# Patient Record
Sex: Female | Born: 1935
Health system: Southern US, Community
[De-identification: ages and names within clinical notes are randomized; demographics above are authoritative.]

## PROBLEM LIST (undated history)

## (undated) DIAGNOSIS — R7303 Prediabetes: Secondary | ICD-10-CM

## (undated) DIAGNOSIS — I10 Essential (primary) hypertension: Secondary | ICD-10-CM

## (undated) DIAGNOSIS — E785 Hyperlipidemia, unspecified: Secondary | ICD-10-CM

## (undated) DIAGNOSIS — K219 Gastro-esophageal reflux disease without esophagitis: Secondary | ICD-10-CM

## (undated) DIAGNOSIS — M810 Age-related osteoporosis without current pathological fracture: Secondary | ICD-10-CM

## (undated) DIAGNOSIS — J449 Chronic obstructive pulmonary disease, unspecified: Secondary | ICD-10-CM

## (undated) DIAGNOSIS — E559 Vitamin D deficiency, unspecified: Secondary | ICD-10-CM

## (undated) HISTORY — PX: TONSILLECTOMY: SUR1361

## (undated) HISTORY — DX: Gastro-esophageal reflux disease without esophagitis: K21.9

## (undated) HISTORY — DX: Vitamin D deficiency, unspecified: E55.9

## (undated) HISTORY — DX: Chronic obstructive pulmonary disease, unspecified: J44.9

## (undated) HISTORY — DX: Prediabetes: R73.03

## (undated) HISTORY — PX: VESICOVAGINAL FISTULA CLOSURE W/ TAH: SUR271

## (undated) HISTORY — DX: Hyperlipidemia, unspecified: E78.5

## (undated) HISTORY — DX: Age-related osteoporosis without current pathological fracture: M81.0

## (undated) HISTORY — DX: Essential (primary) hypertension: I10

---
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
## 1998-03-27 ENCOUNTER — Ambulatory Visit (HOSPITAL_COMMUNITY): Admission: RE | Admit: 1998-03-27 | Discharge: 1998-03-27 | Payer: Self-pay | Admitting: *Deleted

## 1998-03-27 ENCOUNTER — Other Ambulatory Visit: Admission: RE | Admit: 1998-03-27 | Discharge: 1998-03-27 | Payer: Self-pay | Admitting: *Deleted

## 1998-05-07 ENCOUNTER — Other Ambulatory Visit: Admission: RE | Admit: 1998-05-07 | Discharge: 1998-05-07 | Payer: Self-pay | Admitting: Obstetrics and Gynecology

## 1999-06-17 ENCOUNTER — Other Ambulatory Visit: Admission: RE | Admit: 1999-06-17 | Discharge: 1999-06-17 | Payer: Self-pay | Admitting: Obstetrics and Gynecology

## 2000-06-22 ENCOUNTER — Other Ambulatory Visit: Admission: RE | Admit: 2000-06-22 | Discharge: 2000-06-22 | Payer: Self-pay | Admitting: Obstetrics and Gynecology

## 2000-09-30 ENCOUNTER — Encounter (INDEPENDENT_AMBULATORY_CARE_PROVIDER_SITE_OTHER): Payer: Self-pay | Admitting: *Deleted

## 2000-09-30 ENCOUNTER — Ambulatory Visit (HOSPITAL_COMMUNITY): Admission: RE | Admit: 2000-09-30 | Discharge: 2000-09-30 | Payer: Self-pay | Admitting: Gastroenterology

## 2000-10-17 LAB — HM COLONOSCOPY

## 2001-06-24 ENCOUNTER — Other Ambulatory Visit: Admission: RE | Admit: 2001-06-24 | Discharge: 2001-06-24 | Payer: Self-pay | Admitting: Obstetrics and Gynecology

## 2006-06-02 ENCOUNTER — Encounter: Admission: RE | Admit: 2006-06-02 | Discharge: 2006-06-02 | Payer: Self-pay | Admitting: Orthopaedic Surgery

## 2007-04-11 ENCOUNTER — Encounter: Admission: RE | Admit: 2007-04-11 | Discharge: 2007-04-11 | Payer: Self-pay | Admitting: Orthopaedic Surgery

## 2007-10-22 ENCOUNTER — Inpatient Hospital Stay (HOSPITAL_COMMUNITY): Admission: AD | Admit: 2007-10-22 | Discharge: 2007-10-22 | Payer: Self-pay | Admitting: Obstetrics and Gynecology

## 2008-07-03 ENCOUNTER — Ambulatory Visit: Payer: Self-pay | Admitting: *Deleted

## 2008-07-05 ENCOUNTER — Ambulatory Visit: Payer: Self-pay | Admitting: *Deleted

## 2008-08-03 ENCOUNTER — Encounter: Admission: RE | Admit: 2008-08-03 | Discharge: 2008-08-03 | Payer: Self-pay | Admitting: Orthopaedic Surgery

## 2009-08-02 ENCOUNTER — Ambulatory Visit: Payer: Self-pay | Admitting: Vascular Surgery

## 2010-10-27 ENCOUNTER — Encounter (INDEPENDENT_AMBULATORY_CARE_PROVIDER_SITE_OTHER): Payer: Self-pay | Admitting: Internal Medicine

## 2010-10-27 ENCOUNTER — Ambulatory Visit (HOSPITAL_COMMUNITY): Admission: RE | Admit: 2010-10-27 | Discharge: 2010-10-27 | Payer: Self-pay | Admitting: Internal Medicine

## 2010-10-27 ENCOUNTER — Ambulatory Visit: Payer: Self-pay

## 2010-10-27 ENCOUNTER — Ambulatory Visit: Payer: Self-pay | Admitting: Cardiovascular Disease

## 2011-04-21 NOTE — Assessment & Plan Note (Signed)
OFFICE VISIT   Alyssa Schmitt, Alyssa Schmitt  DOB:  [birth year]                                       08/02/2009  EAVWU#:[id number]   The patient presents today for evaluation of her lower extremity  discomfort.  She has a complex past history.  She is a 77 year old white  female with sciatic pain, also pain from plantar fasciitis.  She had  been seen in our office 3 years ago for evaluation and was found to have  normal arterial flow at that time.  She was concerned regarding  continued pain and was seen again for evaluation of this.   Her past history is significant for hypertension.  She also has chronic  venous hypertension changes.   She is married.  She is retired.   REVIEW OF SYSTEMS:  Positive for reflux and extensive arthritis.   PHYSICAL EXAMINATION:  A well-developed, well-nourished white female  appearing stated age of 89.  Blood pressure is 150/79, pulse 78,  respirations 18.  Her radial and dorsalis pedis pulses are 2+  bilaterally.  She has changes of hemosiderin deposit and marked venous  hypertension from her knees distally down onto her ankles.   She underwent noninvasive vascular laboratory studies in our office.  This reveals a normal ankle-arm index and waveforms bilaterally.   I discussed this with the patient and her family who were present.  I do  not see any evidence of arterial insufficiency to explain any of her  discomfort.  She was reassured with this and will see [place] again on an as-  needed basis.   Larina Earthly, M.D.  Electronically Signed   TFE/MEDQ  D:  08/02/2009  T:  08/05/2009  Job:  [id number]   cc:   Vivia Birmingham, MD

## 2011-04-21 NOTE — Assessment & Plan Note (Signed)
OFFICE VISIT   JAQUELINNE, GLENDENING  DOB:  [birth year]                                       07/05/2008  [id number]#:[id number]   The patient is seen today for a slowly healing superficial abrasion on  the dorsum of her right foot.  This occurred 2-3 weeks ago.  She has  noted this to be slow to heal and moderately painful.   Known history of chronic venous insufficiency and varicose veins.  This  has been treated for years with compression hose.   Lower extremity arterial Doppler carried out 07/03/2008 reveals ABIs 1.0  bilaterally with triphasic dorsalis pedis waveforms consistent with a  normal arterial study.   PHYSICAL EXAMINATION:  Evaluation reveals a 77 year old female alert and  oriented.  No acute distress.  BP 139/75, pulse 81 per minute.  Lower  extremity evaluation reveals chronic venous changes bilaterally.  Noted  are pigmentation superficial varicosities.  No palpable pedal pulses.  The feet however are well-perfused.  One to two plus edema.  Superficial  abrasion noted over the dorsum of the right foot 1.5 cm in diameter.  No  surrounding erythema.   The patient and her daughter were reassured that there is no significant  arterial insufficiency.  I recommended local wound care with dry  dressing and avoid tape to the skin.  This should heal well with this  regimen, continue compression therapy for treatment of chronic venous  insufficiency.   Balinda Quails, M.D.  Electronically Signed   PGH/MEDQ  D:  07/05/2008  T:  07/06/2008  Job:  [id number]   cc:   Lovenia Kim, D.O.

## 2011-12-04 ENCOUNTER — Other Ambulatory Visit: Payer: Self-pay | Admitting: Orthopaedic Surgery

## 2011-12-04 ENCOUNTER — Encounter: Payer: Self-pay | Admitting: Cardiovascular Disease

## 2011-12-04 ENCOUNTER — Ambulatory Visit
Admission: RE | Admit: 2011-12-04 | Discharge: 2011-12-04 | Disposition: A | Discharge status: Home / Self Care | Payer: Medicare Other | Source: Ambulatory Visit | Attending: Orthopaedic Surgery | Admitting: Orthopaedic Surgery

## 2011-12-04 DIAGNOSIS — M79669 Pain in unspecified lower leg: Secondary | ICD-10-CM

## 2011-12-07 ENCOUNTER — Other Ambulatory Visit: Payer: Self-pay

## 2012-01-08 ENCOUNTER — Encounter: Payer: Self-pay | Admitting: Cardiovascular Disease

## 2012-02-22 ENCOUNTER — Encounter: Payer: Self-pay | Admitting: Cardiovascular Disease

## 2012-03-01 ENCOUNTER — Other Ambulatory Visit: Payer: Self-pay

## 2012-03-01 DIAGNOSIS — I83893 Varicose veins of bilateral lower extremities with other complications: Secondary | ICD-10-CM

## 2012-03-25 ENCOUNTER — Ambulatory Visit (INDEPENDENT_AMBULATORY_CARE_PROVIDER_SITE_OTHER): Payer: Medicare Other | Admitting: Cardiovascular Disease

## 2012-03-25 ENCOUNTER — Encounter: Payer: Self-pay | Admitting: Cardiovascular Disease

## 2012-03-25 VITALS — BP 136/82 | HR 89 | Resp 18 | Ht 63.0 in | Wt 186.8 lb

## 2012-03-25 DIAGNOSIS — R011 Cardiac murmur, unspecified: Secondary | ICD-10-CM

## 2012-03-25 DIAGNOSIS — R600 Localized edema: Secondary | ICD-10-CM

## 2012-03-25 DIAGNOSIS — R609 Edema, unspecified: Secondary | ICD-10-CM

## 2012-03-25 MED ORDER — POTASSIUM CHLORIDE CRYS ER 20 MEQ PO TBCR: 20.0000 meq | EXTENDED_RELEASE_TABLET | Freq: Every day | ORAL | Status: DC

## 2012-03-25 MED ORDER — FUROSEMIDE 20 MG PO TABS: 20.0000 mg | ORAL_TABLET | Freq: Every day | ORAL | Status: DC

## 2012-03-25 NOTE — Progress Notes (Signed)
    Evonnie Pat Date of Birth  [birth year] [facility]     [place] [facility]  [address]    [address]   [address] [place], [place]  [address]    [place], [place]  [address] [phone]  Fax  [phone]  [phone]  Fax [phone]   History of Present Illness:  Mrs. Perkinson is an 78 year old female with a history of chronic leg edema. She has worsening leg edema on the right compared to the left.   She's had an echocardiogram in November, 2011 which revealed normal left ventricular systolic function. She has only minor valvular problems.  She does not have episodes of chest pain or shortness of breath.   She walks with the assistance of a cane.  She's been taking Lasix for leg. She's noted that it really has not helped much. She's had a venous duplex scan which did not reveal any DVT in her lower extremities.  Current Outpatient Prescriptions on File Prior to Visit  Medication Sig Dispense Refill  . calcium-vitamin D (OSCAL WITH D) 250-125 MG-UNIT per tablet Take 1 tablet by mouth daily.      . fexofenadine (ALLEGRA) 180 MG tablet Take 180 mg by mouth daily.      . furosemide (LASIX) 20 MG tablet Take 20 mg by mouth 2 (two) times daily.      [name] Kitchen levothyroxine (SYNTHROID, LEVOTHROID) 50 MCG tablet Take 50 mcg by mouth daily.      [name] Kitchen omeprazole (PRILOSEC) 20 MG capsule Take 20 mg by mouth daily.      . potassium chloride SA (K-DUR,KLOR-CON) 20 MEQ tablet Take 20 mEq by mouth 2 (two) times daily.      [name] Kitchen TERAZOSIN HCL PO Take by mouth.        Allergies  Allergen Reactions  . Aspirin     Past Medical History  Diagnosis Date  . Abnormal EKG   . H/O echocardiogram     No past surgical history on file.  History  Smoking status  . Never Smoker   Smokeless tobacco  . Not on file    History  Alcohol Use: Not on file    No family history on file.  Reviw of Systems:  Reviewed in the HPI.  All other systems are negative.  Physical Exam: Blood  pressure 136/82, pulse 89, resp. rate 18, height 5\' 3"  (1.6 m), weight 186 lb 12.8 oz (84.732 kg). General: Well developed, well nourished, in no acute distress.  Head: Normocephalic, atraumatic, sclera non-icteric, mucus membranes are moist,   Neck: Supple. Carotids are 2 + without bruits. No JVD  Lungs: Clear bilaterally to auscultation.  Heart: regular rate.  normal  S1 S2. No murmurs, gallops or rubs.  Abdomen: Soft, non-tender, non-distended with normal bowel sounds. No hepatomegaly. No rebound/guarding. No masses.  Msk:  Strength and tone are normal  Extremities: No clubbing or cyanosis. No edema.  Distal pedal pulses are 2+ and equal bilaterally.  Neuro: Alert and oriented X 3. Moves all extremities spontaneously.  Psych:  Responds to questions appropriately with a normal affect.  ECG: 03/25/2012-normal sinus rhythm. Possible left atrial enlargement. There is left axis deviation. There is an intraventricular conduction delay/incomplete right bundle branch block.  Assessment / Plan:

## 2012-03-25 NOTE — Assessment & Plan Note (Signed)
Class presents with chronic leg edema. She's had this leg edema for many years. Her echocardiogram shows that she has normal left ventricular systolic function and has minimal valvular abnormalities.  I don't think that she has any specific cardiac etiology to explain her leg edema.  She has an appointment to see Dr. Edilia Bo next month for evaluation of her veins. I've given her a brochure for the" [facility]" progress which I think will help.  I do not think that the double dose Lasix is helping her any at all. In fact she feels fairly poorly on Lasix. I've recommended that she decrease her Lasix and her potassium to once a day. Given her normal left ventricular systolic function and normal renal function she should not need heart doses of Lasix for leg edema.  In addition, I do not think that increasing her Lasix will ever completely control her leg edema since it is due to venous insufficiency.  I'll see her back on an as-needed basis. She'll followup with with her general medical doctor and with Dr. Edilia Bo.

## 2012-03-25 NOTE — Patient Instructions (Addendum)
Your physician recommends that you schedule a follow-up appointment in: as needed basis  Your physician has requested that you have an echocardiogram. Echocardiography is a painless test that uses sound waves to create images of your heart. It provides your doctor with information about the size and shape of your heart and how well your heart's chambers and valves are working. This procedure takes approximately one hour. There are no restrictions for this procedure.  Your physician has recommended you make the following change in your medication:   DECREASE LASIX TO 20 MG DAILY DECREASE POTASSIUM TO 20 MEQ DAILY

## 2012-04-04 ENCOUNTER — Other Ambulatory Visit: Payer: Self-pay

## 2012-04-04 ENCOUNTER — Ambulatory Visit (HOSPITAL_COMMUNITY): Payer: Medicare Other | Attending: Cardiology

## 2012-04-04 DIAGNOSIS — I517 Cardiomegaly: Secondary | ICD-10-CM | POA: Insufficient documentation

## 2012-04-04 DIAGNOSIS — R609 Edema, unspecified: Secondary | ICD-10-CM | POA: Insufficient documentation

## 2012-04-04 DIAGNOSIS — R011 Cardiac murmur, unspecified: Secondary | ICD-10-CM

## 2012-04-04 DIAGNOSIS — I519 Heart disease, unspecified: Secondary | ICD-10-CM | POA: Insufficient documentation

## 2012-04-04 DIAGNOSIS — Z8249 Family history of ischemic heart disease and other diseases of the circulatory system: Secondary | ICD-10-CM | POA: Insufficient documentation

## 2012-04-04 DIAGNOSIS — I059 Rheumatic mitral valve disease, unspecified: Secondary | ICD-10-CM

## 2012-04-04 DIAGNOSIS — I359 Nonrheumatic aortic valve disorder, unspecified: Secondary | ICD-10-CM | POA: Insufficient documentation

## 2012-04-12 ENCOUNTER — Encounter: Payer: Self-pay | Admitting: Vascular Surgery

## 2012-04-13 ENCOUNTER — Ambulatory Visit (INDEPENDENT_AMBULATORY_CARE_PROVIDER_SITE_OTHER): Payer: Medicare Other | Admitting: Vascular Surgery

## 2012-04-13 ENCOUNTER — Encounter: Payer: Self-pay | Admitting: Vascular Surgery

## 2012-04-13 ENCOUNTER — Encounter (INDEPENDENT_AMBULATORY_CARE_PROVIDER_SITE_OTHER): Payer: Medicare Other | Admitting: *Deleted

## 2012-04-13 VITALS — BP 116/71 | HR 81 | Resp 18 | Ht 64.0 in | Wt 174.6 lb

## 2012-04-13 DIAGNOSIS — I83009 Varicose veins of unspecified lower extremity with ulcer of unspecified site: Secondary | ICD-10-CM

## 2012-04-13 DIAGNOSIS — M7989 Other specified soft tissue disorders: Secondary | ICD-10-CM

## 2012-04-13 DIAGNOSIS — L97909 Non-pressure chronic ulcer of unspecified part of unspecified lower leg with unspecified severity: Secondary | ICD-10-CM

## 2012-04-13 DIAGNOSIS — I83893 Varicose veins of bilateral lower extremities with other complications: Secondary | ICD-10-CM

## 2012-04-13 NOTE — Progress Notes (Signed)
Removed bilateral Unna Boots and cleansed bilateral calves and feet with Carraklenz.  Reapplied bilateral [employer]. Pt. tolerated procedure well.  Alyssa Schmitt, Neena Rhymes

## 2012-04-13 NOTE — Progress Notes (Signed)
[facility]   Patient name: Alyssa Schmitt MRN: [id number] DOB: 11-02-1928 Sex: female  REASON FOR CONSULT: Severe chronic venous insufficiency. Referred by Dr. Oneta Rack   HPI: Alyssa Schmitt is a 78 y.o. female with a long history of chronic venous insufficiency. Approximately a month ago she developed some superficial ulcerations on her legs and has been in boots for the last 2 weeks. These are changed twice a week. These ulcers have improved significantly since she's been undergoing this treatment. She's had discoloration in both lower ferries for as long as she can remember. She is unaware of any previous history of DVT or phlebitis. Does experience swelling in both lower extremities which is exacerbated by standing and sitting. Her swelling is relieved somewhat with elevation. She has not been wearing medical grade compression stockings.  She denies fever or chills. She has been evaluated by Dr. Delane Ginger and was not felt to have congestive heart failure.  Past Medical History  Diagnosis Date  . Abnormal EKG   . H/O echocardiogram   . Ulcer   . Allergy   . Arthritis   . Hypertension   . Thyroid disease     History reviewed. No pertinent family history.  SOCIAL HISTORY: History  Substance Use Topics  . Smoking status: Never Smoker   . Smokeless tobacco: Not on file  . Alcohol Use: No    Allergies  Allergen Reactions  . Aspirin   . Penicillins   . Prednisone   . Tetanus Toxoids   . Tetracyclines & Related     Current Outpatient Prescriptions  Medication Sig Dispense Refill  . BusPIRone HCl (BUSPAR PO) Take 10 mg by mouth 3 (three) times daily. Take 1/2- 1 tablet 3 times daily.      . calcium-vitamin D (OSCAL WITH D) 250-125 MG-UNIT per tablet Take 1 tablet by mouth daily.      . Cholecalciferol (VITAMIN D3) 2000 UNITS TABS Take 1 tablet by mouth 2 (two) times daily.       . citalopram (CELEXA) 20 MG tablet Take 20 mg by mouth daily.       . fexofenadine (ALLEGRA) 180 MG tablet Take 180 mg by mouth daily.      . furosemide (LASIX) 20 MG tablet Take 1 tablet (20 mg total) by mouth daily.  30 tablet  1  . levofloxacin (LEVAQUIN) 500 MG tablet Take 500 mg by mouth daily.      [name] Kitchen levothyroxine (SYNTHROID, LEVOTHROID) 50 MCG tablet Take 50 mcg by mouth daily.      . Magnesium 250 MG TABS Take by mouth.      . Multiple Vitamin (MULITIVITAMIN WITH MINERALS) TABS Take 1 tablet by mouth daily.      [name] Kitchen omeprazole (PRILOSEC) 20 MG capsule Take 40 mg by mouth daily.       . potassium chloride SA (K-DUR,KLOR-CON) 20 MEQ tablet Take 1 tablet (20 mEq total) by mouth daily.      . Probiotic Product (PROBIOTIC FORMULA PO) Take by mouth.      . TERAZOSIN HCL PO Take 10 mg by mouth at bedtime.       . traMADol (ULTRAM) 50 MG tablet Take 50 mg by mouth every 6 (six) hours as needed.      . pyridOXINE (VITAMIN B-6) 100 MG tablet Take 100 mg by mouth daily.        REVIEW OF SYSTEMS: Arly.Keller ] denotes positive finding; [  ] denotes negative finding  CARDIOVASCULAR:  [ ]  chest pain   [ ]  chest pressure   [ ]  palpitations   [ ]  orthopnea   [ ]  dyspnea on exertion   [ ]  claudication   [ ]  rest pain   [ ]  DVT   [ ]  phlebitis PULMONARY:   [ ]  productive cough   [ ]  asthma   [ ]  wheezing NEUROLOGIC:   [ ]  weakness  [ ]  paresthesias  [ ]  aphasia  [ ]  amaurosis  [ ]  dizziness HEMATOLOGIC:   [ ]  bleeding problems   [ ]  clotting disorders MUSCULOSKELETAL:  Arly.Keller ] joint pain   [ ]  joint swelling Arly.Keller ] leg swelling GASTROINTESTINAL: [ ]   blood in stool  [ ]   hematemesis GENITOURINARY:  [ ]   dysuria  [ ]   hematuria PSYCHIATRIC:  [ ]  history of major depression INTEGUMENTARY:  [ ]  rashes  Arly.Keller ] ulcers CONSTITUTIONAL:  [ ]  fever   [ ]  chills  PHYSICAL EXAM: Filed Vitals:   04/13/12 1254  BP: 116/71  Pulse: 81  Resp: 18  Height: 5\' 4"  (1.626 m)  Weight: 174 lb 9.6 oz (79.198 kg)   Body mass index is 29.97 kg/(m^2). GENERAL: The patient is a well-nourished  female, in no acute distress. The vital signs are documented above. CARDIOVASCULAR: There is a regular rate and rhythm without significant murmur appreciated. I do not detect carotid bruits. She has palpable femoral pulses. As a palpable left dorsalis pedis pulse. I cannot palpate posterior tibial pulses because of her swelling. She has moderate bilateral lower extremity swelling. PULMONARY: There is good air exchange bilaterally without wheezing or rales. ABDOMEN: Soft and non-tender with normal pitched bowel sounds.  MUSCULOSKELETAL: There are no major deformities or cyanosis. NEUROLOGIC: No focal weakness or paresthesias are detected. SKIN: she has hyperpigmentation bilaterally consistent with chronic venous insufficiency. She had previous ulcer also in the left leg. PSYCHIATRIC: The patient has a normal affect.  DATA:  She did have a venous duplex scan in [facility] imaging on 12/04/2011 which showed no evidence of DVT in the right lower extremity. I have independently interpreted her venous duplex scan in our office today which shows reflux in both greater saphenous veins. The left greater saphenous vein is duplicate in the thigh. He also has a reflux in the right lesser saphenous vein. There was no evidence of DVT. He was also have reflux in the deep system bilaterally.  Have also reviewed her records that were sent with her. She has been on antibiotics because of cellulitis in both lower extremities. She been taking Levaquin daily.  MEDICAL ISSUES: This patient has evidence of significant bilateral chronic venous insufficiency. We've discussed the importance of continued leg elevation. I would recommend continuing with her Unna boot dressing changes for 2-4 more weeks. Once his wounds have completely healed she should keep the skin well-lubricated and wear medical grade compression stockings. I have written her a prescription for a thigh high compression stockings with a 20-30 mm of mercury  pressure gradient. She does have significant reflux in both greater saphenous veins and all make an appointment for her to see Dr. Hart Rochester in 3 months to be considered for possible laser ablation of her saphenous veins. She knows to call sooner she has problems.   Danitza Schoenfeldt S [facility] [place] [facility]: [phone]

## 2012-04-20 NOTE — Procedures (Unsigned)
LOWER EXTREMITY VENOUS REFLUX EXAM  INDICATION:  Bilateral lower extremity edema with ulceration.  EXAM:  Using color-flow imaging and pulse Doppler spectral analysis, the bilateral common femoral, femoral, popliteal, posterior tibial, great and small saphenous veins were evaluated.  There is evidence suggesting deep venous insufficiency in the bilateral lower extremities.  The bilateral saphenofemoral junction is not competent with Reflux of >576milliseconds. The bilateral great saphenous vein is not competent with Reflux of >516milliseconds with the caliber as described below.   The right proximal small saphenous vein demonstrates incompetency.  GSV Diameter (used if found to be incompetent only)                                                          Right Left Proximal Greater Saphenous Vein                          0.76 cm 0.76 cm Proximal-to-mid-thigh                                    0.73 cm     0.9 cm Mid thigh                                                0.79 cm 0.58/0.66 cm Mid-distal thigh                                         cm          cm Distal thigh                                             0.90 cm 0.64/0.56 cm Knee                                                     0.79 cm 0.56/0.42 cm  IMPRESSION: 1. The bilateral great saphenous vein is not competent with Reflux of     >568milliseconds.  The left great saphenous vein is duplicated in     the thigh. 2. The deep venous system is not competent with Reflux of     >542milliseconds. 3. The right small saphenous vein is not competent; however,     measurements could not be taken due to wound dressings.  ___________________________________________ Di Kindle. Edilia Bo, M.D.  LT/MEDQ  D:  04/13/2012  T:  04/13/2012  Job:  [id number]

## 2012-07-18 ENCOUNTER — Encounter: Payer: Self-pay | Admitting: Vascular Surgery

## 2012-07-19 ENCOUNTER — Ambulatory Visit (INDEPENDENT_AMBULATORY_CARE_PROVIDER_SITE_OTHER): Payer: Medicare Other | Admitting: Vascular Surgery

## 2012-07-19 ENCOUNTER — Encounter: Payer: Self-pay | Admitting: Vascular Surgery

## 2012-07-19 VITALS — BP 160/75 | HR 87 | Resp 18 | Ht 62.0 in | Wt 175.0 lb

## 2012-07-19 DIAGNOSIS — I83893 Varicose veins of bilateral lower extremities with other complications: Secondary | ICD-10-CM

## 2012-07-19 NOTE — Progress Notes (Signed)
Subjective:     Patient ID: Alyssa Schmitt, female   DOB: 05/31/1928, 78 y.o.   MRN: [id number]  HPI this 78 year old female returns today to discuss further treatment regarding her severe venous insufficiency of both lower extremities. She has had multiple stasis ulcers in both legs most recent of which were treated with Unna boots successfully. She was found to have gross reflux in both great saphenous systems at her last visit when seen by Dr. Durwin Nora. She has been wearing long leg elastic compression stockings and trying elevation and ibuprofen. Currently the ulcers are healed. She also has aching throbbing and burning discomfort throughout the day.  Past Medical History  Diagnosis Date  . Abnormal EKG   . H/O echocardiogram   . Ulcer   . Allergy   . Arthritis   . Hypertension   . Thyroid disease     History  Substance Use Topics  . Smoking status: Never Smoker   . Smokeless tobacco: Never Used  . Alcohol Use: No    Family History  Problem Relation Age of Onset  . Heart disease Mother   . Other Mother     VARICOSE VEINS  . Diabetes Father   . Other Brother     VARICOSE VEINS    Allergies  Allergen Reactions  . Aspirin   . Celebrex (Celecoxib)   . Penicillins   . Prednisone   . Tetanus Toxoids   . Tetracyclines & Related     Current outpatient prescriptions:BusPIRone HCl (BUSPAR PO), Take 10 mg by mouth 3 (three) times daily. Take 1/2- 1 tablet 3 times daily., Disp: , Rfl: ;  calcium-vitamin D (OSCAL WITH D) 250-125 MG-UNIT per tablet, Take 1 tablet by mouth daily., Disp: , Rfl: ;  Cholecalciferol (VITAMIN D3) 2000 UNITS TABS, Take 1 tablet by mouth 2 (two) times daily. , Disp: , Rfl: ;  citalopram (CELEXA) 20 MG tablet, Take 20 mg by mouth daily., Disp: , Rfl:  fexofenadine (ALLEGRA) 180 MG tablet, Take 180 mg by mouth daily., Disp: , Rfl: ;  levofloxacin (LEVAQUIN) 500 MG tablet, Take 500 mg by mouth daily., Disp: , Rfl: ;  levothyroxine (SYNTHROID, LEVOTHROID) 50 MCG  tablet, Take 50 mcg by mouth daily., Disp: , Rfl: ;  Magnesium 250 MG TABS, Take by mouth., Disp: , Rfl: ;  Multiple Vitamin (MULITIVITAMIN WITH MINERALS) TABS, Take 1 tablet by mouth daily., Disp: , Rfl:  omeprazole (PRILOSEC) 20 MG capsule, Take 40 mg by mouth daily. , Disp: , Rfl: ;  Probiotic Product (PROBIOTIC FORMULA PO), Take by mouth., Disp: , Rfl: ;  pyridOXINE (VITAMIN B-6) 100 MG tablet, Take 100 mg by mouth daily., Disp: , Rfl: ;  TERAZOSIN HCL PO, Take 10 mg by mouth at bedtime. , Disp: , Rfl: ;  traMADol (ULTRAM) 50 MG tablet, Take 50 mg by mouth every 6 (six) hours as needed., Disp: , Rfl:  furosemide (LASIX) 20 MG tablet, Take 1 tablet (20 mg total) by mouth daily., Disp: 30 tablet, Rfl: 1;  potassium chloride SA (K-DUR,KLOR-CON) 20 MEQ tablet, Take 1 tablet (20 mEq total) by mouth daily., Disp: , Rfl:   BP 160/75  Pulse 87  Resp 18  Ht 5\' 2"  (1.575 m)  Wt 175 lb (79.379 kg)  BMI 32.01 kg/m2  Body mass index is 32.01 kg/(m^2).          Review of Systems denies chest pain, dyspnea on exertion, PND, orthopnea, hemoptysis.     Objective:   Physical  Exam blood pressure 160/75 heart rate 87 respirations 18 Elderly female in no apparent distress alert and oriented x3 Lungs no rhonchi or wheezing Bilateral lower extremities with chronic 1-2+ edema. She has severe hyperpigmentation and thickening of the skin lower half of both legs. 2+ dorsalis pedis pulses palpable.  I have reviewed her venous duplex exam which reveals severe reflux in both great saphenous systems. The right side has a single system the left side has a double system. All of these have reflux. His    Assessment:     History of severe bilateral venous insufficiency with multiple stasis ulcers currently healed 2due to reflux great saphenous systems bilaterally    Plan:       patient needs #1 laser ablation right great saphenous vein #2 laser ablation left great saphenous vein and anterior accessory branch  left great saphenous vein as a single procedure  Will proceed with precertification to perform this in the near future to

## 2012-08-01 ENCOUNTER — Other Ambulatory Visit: Payer: Self-pay | Admitting: *Deleted

## 2012-08-01 DIAGNOSIS — I83893 Varicose veins of bilateral lower extremities with other complications: Secondary | ICD-10-CM

## 2012-08-16 ENCOUNTER — Emergency Department (HOSPITAL_COMMUNITY)
Admission: EM | Admit: 2012-08-16 | Discharge: 2012-08-16 | Disposition: A | Discharge status: Home / Self Care | Payer: Medicare Other | Attending: Emergency Medicine | Admitting: Emergency Medicine

## 2012-08-16 ENCOUNTER — Encounter (HOSPITAL_COMMUNITY): Payer: Self-pay | Admitting: *Deleted

## 2012-08-16 ENCOUNTER — Emergency Department (HOSPITAL_COMMUNITY): Payer: Medicare Other

## 2012-08-16 DIAGNOSIS — I10 Essential (primary) hypertension: Secondary | ICD-10-CM | POA: Insufficient documentation

## 2012-08-16 DIAGNOSIS — Z8249 Family history of ischemic heart disease and other diseases of the circulatory system: Secondary | ICD-10-CM | POA: Insufficient documentation

## 2012-08-16 DIAGNOSIS — Z880 Allergy status to penicillin: Secondary | ICD-10-CM | POA: Insufficient documentation

## 2012-08-16 DIAGNOSIS — S0100XA Unspecified open wound of scalp, initial encounter: Secondary | ICD-10-CM | POA: Insufficient documentation

## 2012-08-16 DIAGNOSIS — Z833 Family history of diabetes mellitus: Secondary | ICD-10-CM | POA: Insufficient documentation

## 2012-08-16 DIAGNOSIS — Z881 Allergy status to other antibiotic agents status: Secondary | ICD-10-CM | POA: Insufficient documentation

## 2012-08-16 DIAGNOSIS — W19XXXA Unspecified fall, initial encounter: Secondary | ICD-10-CM | POA: Insufficient documentation

## 2012-08-16 DIAGNOSIS — Z888 Allergy status to other drugs, medicaments and biological substances status: Secondary | ICD-10-CM | POA: Insufficient documentation

## 2012-08-16 DIAGNOSIS — S2239XA Fracture of one rib, unspecified side, initial encounter for closed fracture: Secondary | ICD-10-CM | POA: Insufficient documentation

## 2012-08-16 DIAGNOSIS — S0101XA Laceration without foreign body of scalp, initial encounter: Secondary | ICD-10-CM

## 2012-08-16 DIAGNOSIS — Z8489 Family history of other specified conditions: Secondary | ICD-10-CM | POA: Insufficient documentation

## 2012-08-16 MED ORDER — HYDROCODONE-ACETAMINOPHEN 5-500 MG PO TABS: 1.0000 | ORAL_TABLET | Freq: Four times a day (QID) | ORAL | Status: AC | PRN

## 2012-08-16 NOTE — ED Notes (Signed)
Pt states she got up this AM to go to the bathroom and got her feet tangled and fell-struck the top of her head-noted head lac with bleeding controlled at present time-also states her tailbone hurts and she has an abrasion to her left mid back

## 2012-08-16 NOTE — ED Notes (Signed)
Pt is alert and oriented-no LOC-in no distress-wound care with 1/2 strength peroxide to lac top of her head-warm blankets applied for comfort

## 2012-08-16 NOTE — ED Provider Notes (Signed)
History     CSN: [id number]  Arrival date & time 08/16/12  4098   First MD Initiated Contact with Patient 08/16/12 [phone]      Chief Complaint  Patient presents with  . Fall     Patient is a 78 y.o. female presenting with fall. The history is provided by the patient.  Fall The fall occurred while walking. She landed on a hard floor. The volume of blood lost was moderate. The point of impact was the head. Pertinent negatives include no numbness and no nausea.   Pt states she got up this AM to go to the bathroom and got her feet tangled and fell-struck the top of her head-noted head lac with bleeding controlled at present time-also states her tailbone hurts and she has an abrasion to her left mid back  Past Medical History  Diagnosis Date  . Abnormal EKG   . H/O echocardiogram   . Ulcer   . Allergy   . Arthritis   . Hypertension   . Thyroid disease     Past Surgical History  Procedure Date  . Tonsillectomy     childhood  . Vesicovaginal fistula closure w/ tah     Family History  Problem Relation Age of Onset  . Heart disease Mother   . Other Mother     VARICOSE VEINS  . Diabetes Father   . Other Brother     VARICOSE VEINS    History  Substance Use Topics  . Smoking status: Never Smoker   . Smokeless tobacco: Never Used  . Alcohol Use: No    OB History    Grav Para Term Preterm Abortions TAB SAB Ect Mult Living                  Review of Systems  Gastrointestinal: Negative for nausea.  Neurological: Negative for numbness.  All other systems reviewed and are negative.    Allergies  Aspirin; Celebrex; Penicillins; Prednisone; Tetanus toxoids; and Tetracyclines & related  Home Medications   Current Outpatient Rx  Name Route Sig Dispense Refill  . BUSPAR PO Oral Take 10 mg by mouth 3 (three) times daily. Take 1/2- 1 tablet 3 times daily.    [name] Kitchen CALCIUM CARBONATE-VITAMIN D 250-125 MG-UNIT PO TABS Oral Take 1 tablet by mouth daily.    [name] Kitchen VITAMIN D3 2000  UNITS PO TABS Oral Take 1 tablet by mouth 2 (two) times daily.     [name] Kitchen CITALOPRAM HYDROBROMIDE 20 MG PO TABS Oral Take 20 mg by mouth daily.    [name] Kitchen FEXOFENADINE HCL 180 MG PO TABS Oral Take 180 mg by mouth daily.    . FUROSEMIDE 20 MG PO TABS Oral Take 1 tablet (20 mg total) by mouth daily. 30 tablet 1  . HYDROCODONE-ACETAMINOPHEN 5-500 MG PO TABS Oral Take 1-2 tablets by mouth every 6 (six) hours as needed for pain. 15 tablet 0  . LEVOFLOXACIN 500 MG PO TABS Oral Take 500 mg by mouth daily.    [name] Kitchen LEVOTHYROXINE SODIUM 50 MCG PO TABS Oral Take 50 mcg by mouth daily.    [name] Kitchen MAGNESIUM 250 MG PO TABS Oral Take by mouth.    . ADULT MULTIVITAMIN W/MINERALS CH Oral Take 1 tablet by mouth daily.    [name] Kitchen OMEPRAZOLE 20 MG PO CPDR Oral Take 40 mg by mouth daily.     [name] Kitchen POTASSIUM CHLORIDE CRYS ER 20 MEQ PO TBCR Oral Take 1 tablet (20 mEq total) by mouth daily.    [name] Kitchen  PROBIOTIC FORMULA PO Oral Take by mouth.    [name] Kitchen VITAMIN B-6 100 MG PO TABS Oral Take 100 mg by mouth daily.    [name] Kitchen TERAZOSIN HCL PO Oral Take 10 mg by mouth at bedtime.     . TRAMADOL HCL 50 MG PO TABS Oral Take 50 mg by mouth every 6 (six) hours as needed.      BP 150/55  Pulse 73  Temp 97.5 F (36.4 C) (Oral)  Resp 20  Ht 5\' 1"  (1.549 m)  Wt 170 lb (77.111 kg)  BMI 32.12 kg/m2  SpO2 99%  Physical Exam  HENT:  Head: Head is with laceration.    Pulmonary/Chest:      ED Course  LACERATION REPAIR Date/Time: 08/16/2012 5:07 AM Performed by: Nelia Shi Authorized by: Nelia Shi Consent: Verbal consent obtained. Risks and benefits: risks, benefits and alternatives were discussed Consent given by: patient Patient understanding: patient states understanding of the procedure being performed Patient identity confirmed: verbally with patient Time out: Immediately prior to procedure a "time out" was called to verify the correct patient, procedure, equipment, support staff and site/side marked as required. Body area:  head/neck Location details: scalp Laceration length: 6 cm Foreign bodies: no foreign bodies Vascular damage: no Anesthesia: local infiltration Local anesthetic: lidocaine 2% with epinephrine Anesthetic total: 5 ml Patient sedated: no Preparation: Patient was prepped and draped in the usual sterile fashion. Irrigation solution: tap water Amount of cleaning: standard Debridement: none Degree of undermining: none Skin closure: staples Number of sutures: 7 Technique: simple Approximation: close Dressing: antibiotic ointment Patient tolerance: Patient tolerated the procedure well with no immediate complications.   (including critical care time)  Labs Reviewed - No data to display Dg Ribs Unilateral W/chest Left  08/16/2012  *RADIOLOGY REPORT*  Clinical Data: Left lower rib pain after fall.  LEFT RIBS AND CHEST - 3+ VIEW  Comparison: None.  Findings: Cardiac enlargement with mild pulmonary vascular congestion.  No significant edema.  Slight fibrosis in the lung bases.  No focal airspace consolidation in the lungs.  No blunting of costophrenic angles.  No pneumothorax.  Large esophageal hiatal hernia behind the heart.  Calcification of the aorta.  Minimal focal irregularities in the left posterolateral 7th and 10th ribs may represent nondisplaced fractures.  Left ribs appear otherwise intact.  No focal bone lesions or expansile change.  IMPRESSION: No evidence of active pulmonary disease.  Suggestion of nondisplaced left seventh and tenth rib fractures.   Original Report Authenticated By: Marlon Pel, M.D.      1. Scalp laceration   2. Rib fracture       MDM          Nelia Shi, MD 08/16/12 [phone]

## 2012-08-22 ENCOUNTER — Other Ambulatory Visit: Payer: Medicare Other | Admitting: Vascular Surgery

## 2012-08-29 ENCOUNTER — Encounter: Payer: Self-pay | Admitting: Vascular Surgery

## 2012-08-30 ENCOUNTER — Ambulatory Visit: Payer: Medicare Other | Admitting: Vascular Surgery

## 2013-04-19 ENCOUNTER — Other Ambulatory Visit (HOSPITAL_COMMUNITY): Payer: Self-pay | Admitting: Internal Medicine

## 2013-04-19 DIAGNOSIS — M810 Age-related osteoporosis without current pathological fracture: Secondary | ICD-10-CM

## 2013-04-20 ENCOUNTER — Ambulatory Visit (HOSPITAL_COMMUNITY)
Admission: RE | Admit: 2013-04-20 | Discharge: 2013-04-20 | Disposition: A | Discharge status: Home / Self Care | Payer: Medicare Other | Source: Ambulatory Visit | Attending: Internal Medicine | Admitting: Internal Medicine

## 2013-04-20 DIAGNOSIS — Z13820 Encounter for screening for osteoporosis: Secondary | ICD-10-CM | POA: Insufficient documentation

## 2013-04-20 DIAGNOSIS — M810 Age-related osteoporosis without current pathological fracture: Secondary | ICD-10-CM

## 2013-04-20 DIAGNOSIS — Z780 Asymptomatic menopausal state: Secondary | ICD-10-CM | POA: Insufficient documentation

## 2013-06-16 LAB — HM MAMMOGRAPHY

## 2013-10-17 ENCOUNTER — Encounter: Payer: Self-pay | Admitting: Internal Medicine

## 2013-10-17 DIAGNOSIS — R7309 Other abnormal glucose: Secondary | ICD-10-CM | POA: Insufficient documentation

## 2013-10-17 DIAGNOSIS — E559 Vitamin D deficiency, unspecified: Secondary | ICD-10-CM

## 2013-10-17 DIAGNOSIS — I10 Essential (primary) hypertension: Secondary | ICD-10-CM | POA: Insufficient documentation

## 2013-10-17 DIAGNOSIS — E785 Hyperlipidemia, unspecified: Secondary | ICD-10-CM

## 2013-10-17 DIAGNOSIS — R7303 Prediabetes: Secondary | ICD-10-CM

## 2013-10-17 DIAGNOSIS — E782 Mixed hyperlipidemia: Secondary | ICD-10-CM | POA: Insufficient documentation

## 2013-10-18 ENCOUNTER — Encounter: Payer: Self-pay | Admitting: Physician Assistant

## 2013-10-18 ENCOUNTER — Ambulatory Visit: Payer: Medicare Other | Admitting: Physician Assistant

## 2013-10-18 VITALS — BP 130/78 | HR 84 | Temp 97.9°F | Resp 16 | Wt 177.0 lb

## 2013-10-18 DIAGNOSIS — E785 Hyperlipidemia, unspecified: Secondary | ICD-10-CM

## 2013-10-18 DIAGNOSIS — E559 Vitamin D deficiency, unspecified: Secondary | ICD-10-CM

## 2013-10-18 DIAGNOSIS — R7303 Prediabetes: Secondary | ICD-10-CM

## 2013-10-18 DIAGNOSIS — I10 Essential (primary) hypertension: Secondary | ICD-10-CM

## 2013-10-18 LAB — BASIC METABOLIC PANEL WITH GFR
BUN: 20 mg/dL (ref 6–23)
CO2: 28 mEq/L (ref 19–32)
Calcium: 9.5 mg/dL (ref 8.4–10.5)
Chloride: 101 mEq/L (ref 96–112)
Creat: 0.92 mg/dL (ref 0.50–1.10)
GFR, Est African American: 67 mL/min
GFR, Est Non African American: 58 mL/min — ABNORMAL LOW
Glucose, Bld: 84 mg/dL (ref 70–99)
Potassium: 4.3 mEq/L (ref 3.5–5.3)
Sodium: 138 mEq/L (ref 135–145)

## 2013-10-18 LAB — CBC WITH DIFFERENTIAL/PLATELET
Basophils Absolute: 0 10*3/uL (ref 0.0–0.1)
Basophils Relative: 0 % (ref 0–1)
Eosinophils Absolute: 0 10*3/uL (ref 0.0–0.7)
Eosinophils Relative: 1 % (ref 0–5)
HCT: 38.6 % (ref 36.0–46.0)
Hemoglobin: 13.2 g/dL (ref 12.0–15.0)
Lymphocytes Relative: 43 % (ref 12–46)
Lymphs Abs: 1.9 10*3/uL (ref 0.7–4.0)
MCH: 30.3 pg (ref 26.0–34.0)
MCHC: 34.2 g/dL (ref 30.0–36.0)
MCV: 88.5 fL (ref 78.0–100.0)
Monocytes Absolute: 0.5 10*3/uL (ref 0.1–1.0)
Monocytes Relative: 11 % (ref 3–12)
Neutro Abs: 2 10*3/uL (ref 1.7–7.7)
Neutrophils Relative %: 45 % (ref 43–77)
Platelets: 177 10*3/uL (ref 150–400)
RBC: 4.36 MIL/uL (ref 3.87–5.11)
RDW: 13.5 % (ref 11.5–15.5)
WBC: 4.6 10*3/uL (ref 4.0–10.5)

## 2013-10-18 LAB — HEPATIC FUNCTION PANEL
ALT: 20 U/L (ref 0–35)
AST: 32 U/L (ref 0–37)
Albumin: 4.2 g/dL (ref 3.5–5.2)
Alkaline Phosphatase: 67 U/L (ref 39–117)
Bilirubin, Direct: 0.2 mg/dL (ref 0.0–0.3)
Indirect Bilirubin: 0.5 mg/dL (ref 0.0–0.9)
Total Bilirubin: 0.7 mg/dL (ref 0.3–1.2)
Total Protein: 7.5 g/dL (ref 6.0–8.3)

## 2013-10-18 LAB — LIPID PANEL
Cholesterol: 152 mg/dL (ref 0–200)
HDL: 52 mg/dL (ref >39)
LDL Cholesterol: 81 mg/dL (ref 0–99)
Total CHOL/HDL Ratio: 2.9 Ratio
Triglycerides: 96 mg/dL (ref <150)
VLDL: 19 mg/dL (ref 0–40)

## 2013-10-18 LAB — TSH: TSH: 1.759 u[IU]/mL (ref 0.350–4.500)

## 2013-10-18 LAB — HEMOGLOBIN A1C
Hgb A1c MFr Bld: 5.3 % (ref <5.7)
Mean Plasma Glucose: 105 mg/dL (ref <117)

## 2013-10-18 NOTE — Patient Instructions (Signed)

## 2013-10-18 NOTE — Progress Notes (Signed)
HPI Patient presents for 3 month follow up with hypertension, hyperlipidemia, prediabetes and vitamin D.  Patient's blood pressure has been controlled at home. Patient denies chest pain, shortness of breath, dizziness.  Patient's cholesterol is diet controlled. The cholesterol last visit was 79. she has  been working on diet and exercise for prediabetes, denies changes in vision, polys, and paresthesias.  A1C was 5.6. Patient is on Vitamin D supplement.  Has gotten Synvisc injection in right knee for past 3 weeks and feels it is helping and she takes tramadol if needed.  Current Medications:    Medication List       This list is accurate as of: 10/18/13  3:05 PM.  Always use your most recent med list.               BUSPAR PO  Take 10 mg by mouth 3 (three) times daily. Take 1/2- 1 tablet 3 times daily.     calcium-vitamin D 250-125 MG-UNIT per tablet  Commonly known as:  OSCAL WITH D  Take 1 tablet by mouth daily.     citalopram 20 MG tablet  Commonly known as:  CELEXA  Take 20 mg by mouth daily.     fexofenadine 180 MG tablet  Commonly known as:  ALLEGRA  Take 180 mg by mouth daily. 1/2 tab at night     furosemide 20 MG tablet  Commonly known as:  LASIX  Take 1 tablet (20 mg total) by mouth daily.     levothyroxine 50 MCG tablet  Commonly known as:  SYNTHROID, LEVOTHROID  Take 50 mcg by mouth daily.     Magnesium 250 MG Tabs  Take by mouth.     multivitamin with minerals Tabs tablet  Take 1 tablet by mouth daily.     omeprazole 20 MG capsule  Commonly known as:  PRILOSEC  Take 40 mg by mouth daily.     potassium chloride SA 20 MEQ tablet  Commonly known as:  K-DUR,KLOR-CON  Take 1 tablet (20 mEq total) by mouth daily.     PROBIOTIC FORMULA PO  Take by mouth.     pyridOXINE 100 MG tablet  Commonly known as:  VITAMIN B-6  Take 100 mg by mouth daily.     TERAZOSIN HCL PO  Take 10 mg by mouth at bedtime.     traMADol 50 MG tablet  Commonly known as:   ULTRAM  Take 50 mg by mouth every 6 (six) hours as needed.     Vitamin D3 2000 UNITS Tabs  Take 1 tablet by mouth 2 (two) times daily.       Allergies:  Allergies  Allergen Reactions  . Aspirin   . Celebrex [Celecoxib]   . Fosamax [Alendronate Sodium]   . Keflex [Cephalexin]   . Penicillins   . Prednisone   . Prevacid [Lansoprazole]   . Sulfa Antibiotics   . Tetanus Toxoids   . Tetracyclines & Related     ROS Constitutional: Denies fever, chills, weight loss/gain, headaches, insomnia, fatigue, night sweats, and change in appetite. Eyes: Denies redness, blurred vision, diplopia, discharge, itchy, watery eyes.  ENT: Denies discharge, congestion, post nasal drip, sore throat, earache, dental pain, Tinnitus, Vertigo, Sinus pain, snoring.  Cardio: Denies chest pain, palpitations, irregular heartbeat,  dyspnea, diaphoresis, orthopnea, PND, claudication, edema Respiratory: denies cough, dyspnea,pleurisy, hoarseness, wheezing.  Gastrointestinal: Denies dysphagia, heartburn,  water brash, pain, cramps, nausea, vomiting, bloating, diarrhea, constipation, hematemesis, melena, hematochezia,  hemorrhoids Genitourinary: Denies dysuria, frequency, urgency,  nocturia, hesitancy, discharge, hematuria, flank pain Musculoskeletal: Denies arthralgia, myalgia, stiffness, Jt. Swelling, pain, limp, and strain/sprain. Skin: Denies puritis, rash, hives, warts, acne, eczema, changing in skin lesion Neuro: Weakness, tremor, incoordination, spasms, paresthesia, pain Psychiatric: Denies confusion, memory loss, sensory loss Endocrine: Denies change in weight, skin, hair change, nocturia, and paresthesia, Diabetic Polys, visual blurring, hyper /hypo glycemic episodes.  Heme/Lymph: Excessive bleeding, bruising, enlarged lymph nodes Medical History:  Past Medical History  Diagnosis Date  . Abnormal EKG     LBBB  . H/O echocardiogram   . Ulcer   . Allergy   . Arthritis   . Hypertension   . Thyroid disease    . Hyperlipidemia   . GERD (gastroesophageal reflux disease)   . Anxiety   . Depression   . COPD (chronic obstructive pulmonary disease)   . Osteoporosis   . Prediabetes   . Venous insufficiency   . Vitamin D deficiency    Family history- Review and unchanged Social history- Review and unchanged Physical Exam: Filed Vitals:   10/18/13 1501  BP: 130/78  Pulse: 84  Temp: 97.9 F (36.6 C)  Resp: 16   Filed Weights   10/18/13 1501  Weight: 177 lb (80.287 kg)   General Appearance: Well nourished, in no apparent distress. Eyes: PERRLA, EOMs, conjunctiva no swelling or erythema, normal fundi and vessels. Sinuses: No Frontal/maxillary tenderness ENT/Mouth: Ext aud canals clear, with TMs without erythema, bulging.No erythema, swelling, or exudate on post pharynx.  Tonsils not swollen or erythematous. Hearing normal.  Neck: Supple, thyroid normal.  Respiratory: Respiratory effort normal, BS equal bilaterally without rales, rhonci, wheezing or stridor.  Cardio: Heart sounds normal, regular rate and rhythm with 4/6 systolic murmur but no rubs or gallops. Peripheral pulses brisk and equal bilaterally, without edema.  Abdomen: Flat, soft, with bowel sounds. Nontender, no guarding, rebound, hernias, masses, or organomegaly.  Lymphatics: Non tender without lymphadenopathy.  Musculoskeletal: Full ROM all peripheral extremities, joint stability, 5/5 strength, and normal gait. Skin: Warm, dry without rashes, lesions, ecchymosis.  Neuro: Cranial nerves intact, reflexes equal bilaterally. Normal muscle tone, no cerebellar symptoms. Sensation intact.  Pysch: Awake and oriented X 3, normal affect, Insight and Judgment appropriate.   Assessement and Plan:  Hypertension: Continue medication, monitor blood pressure at home. Continue DASH diet. Cholesterol: Continue diet and exercise. Check cholesterol.  Pre-diabetes-Continue diet and exercise. Check A1C Vitamin D Def- check level and continue  medications.   Alyssa Schmitt 3:05 PM

## 2013-10-19 LAB — VITAMIN D 25 HYDROXY (VIT D DEFICIENCY, FRACTURES): Vit D, 25-Hydroxy: 79 ng/mL (ref 30–89)

## 2013-10-19 LAB — INSULIN, FASTING: Insulin fasting, serum: 13 u[IU]/mL (ref 3–28)

## 2013-11-14 ENCOUNTER — Ambulatory Visit (INDEPENDENT_AMBULATORY_CARE_PROVIDER_SITE_OTHER): Payer: Medicare Other | Admitting: Internal Medicine

## 2013-11-14 ENCOUNTER — Encounter: Payer: Self-pay | Admitting: Internal Medicine

## 2013-11-14 VITALS — BP 138/84 | HR 76 | Temp 97.9°F | Resp 18 | Ht 61.0 in | Wt 179.6 lb

## 2013-11-14 DIAGNOSIS — I10 Essential (primary) hypertension: Secondary | ICD-10-CM

## 2013-11-14 DIAGNOSIS — E782 Mixed hyperlipidemia: Secondary | ICD-10-CM

## 2013-11-14 DIAGNOSIS — M159 Polyosteoarthritis, unspecified: Secondary | ICD-10-CM

## 2013-11-14 DIAGNOSIS — N3000 Acute cystitis without hematuria: Secondary | ICD-10-CM

## 2013-11-14 DIAGNOSIS — R7309 Other abnormal glucose: Secondary | ICD-10-CM

## 2013-11-14 DIAGNOSIS — E559 Vitamin D deficiency, unspecified: Secondary | ICD-10-CM

## 2013-11-14 DIAGNOSIS — D485 Neoplasm of uncertain behavior of skin: Secondary | ICD-10-CM

## 2013-11-14 DIAGNOSIS — Z79899 Other long term (current) drug therapy: Secondary | ICD-10-CM

## 2013-11-14 LAB — BASIC METABOLIC PANEL WITH GFR
BUN: 19 mg/dL (ref 6–23)
CO2: 26 mEq/L (ref 19–32)
Calcium: 9.8 mg/dL (ref 8.4–10.5)
Chloride: 102 mEq/L (ref 96–112)
Creat: 0.94 mg/dL (ref 0.50–1.10)
GFR, Est African American: 65 mL/min
GFR, Est Non African American: 56 mL/min — ABNORMAL LOW
Glucose, Bld: 78 mg/dL (ref 70–99)
Potassium: 4.2 mEq/L (ref 3.5–5.3)
Sodium: 137 mEq/L (ref 135–145)

## 2013-11-14 LAB — HEPATIC FUNCTION PANEL
ALT: 17 U/L (ref 0–35)
AST: 31 U/L (ref 0–37)
Albumin: 4.4 g/dL (ref 3.5–5.2)
Alkaline Phosphatase: 77 U/L (ref 39–117)
Bilirubin, Direct: 0.1 mg/dL (ref 0.0–0.3)
Indirect Bilirubin: 0.5 mg/dL (ref 0.0–0.9)
Total Bilirubin: 0.6 mg/dL (ref 0.3–1.2)
Total Protein: 7.9 g/dL (ref 6.0–8.3)

## 2013-11-14 LAB — HEMOGLOBIN A1C
Hgb A1c MFr Bld: 5.6 % (ref <5.7)
Mean Plasma Glucose: 114 mg/dL (ref <117)

## 2013-11-14 LAB — CBC WITH DIFFERENTIAL/PLATELET
Basophils Absolute: 0 10*3/uL (ref 0.0–0.1)
Basophils Relative: 0 % (ref 0–1)
Eosinophils Absolute: 0.1 10*3/uL (ref 0.0–0.7)
Eosinophils Relative: 1 % (ref 0–5)
HCT: 39.1 % (ref 36.0–46.0)
Hemoglobin: 13.4 g/dL (ref 12.0–15.0)
Lymphocytes Relative: 29 % (ref 12–46)
Lymphs Abs: 1.5 10*3/uL (ref 0.7–4.0)
MCH: 30.5 pg (ref 26.0–34.0)
MCHC: 34.3 g/dL (ref 30.0–36.0)
MCV: 89.1 fL (ref 78.0–100.0)
Monocytes Absolute: 0.6 10*3/uL (ref 0.1–1.0)
Monocytes Relative: 12 % (ref 3–12)
Neutro Abs: 3 10*3/uL (ref 1.7–7.7)
Neutrophils Relative %: 58 % (ref 43–77)
Platelets: 187 10*3/uL (ref 150–400)
RBC: 4.39 MIL/uL (ref 3.87–5.11)
RDW: 13.5 % (ref 11.5–15.5)
WBC: 5.2 10*3/uL (ref 4.0–10.5)

## 2013-11-14 LAB — LIPID PANEL
Cholesterol: 158 mg/dL (ref 0–200)
HDL: 51 mg/dL (ref >39)
LDL Cholesterol: 86 mg/dL (ref 0–99)
Total CHOL/HDL Ratio: 3.1 Ratio
Triglycerides: 105 mg/dL (ref <150)
VLDL: 21 mg/dL (ref 0–40)

## 2013-11-14 LAB — MAGNESIUM: Magnesium: 1.9 mg/dL (ref 1.5–2.5)

## 2013-11-14 MED ORDER — TRAMADOL HCL 50 MG PO TABS: ORAL_TABLET | ORAL | Status: DC

## 2013-11-14 NOTE — Progress Notes (Signed)
Patient ID: Alyssa Schmitt, female   DOB: 01/21/1928, 79 y.o.   MRN: [id number]   This very nice79 yo WWFpresents for 3 month follow up with Hypertension, Hyperlipidemia, Pre-Diabetes and Vitamin D Deficiency. Patient did have recent fall at home w/o LOC and is c/o some soreness of her Lt upper ant chest. She's having no difficulty breathing.   BP has been controlled at home. Today's BP is  138/84. Patient denies any cardiac type chest pain, palpitations, dyspnea/orthopnea/PND, dizziness, claudication, or dependent edema.   Hyperlipidemia is controlled with diet & meds. Last Cholesterol was  157, Triglycerides were 92, HDL 60 and LDL 79 in August - all at goal. Patient denies myalgias or other med SE's.    Also, the patient has history of PreDiabetes with last A1c of  5.6% in August. Patient denies any symptoms of reactive hypoglycemia, diabetic polys, paresthesias or visual blurring.   Further, Patient has history of Vitamin D Deficiency with last vitamin D of 88 in August Patient supplements vitamin D without any suspected side-effects. Patient has been treated for several UTI's over the last year and occasionally is symptomatic.   Also, patient c/o's of an irritated skin lesion along the lower border of the Rt jaw.  Medication Sig Dispense Refill  . BusPIRone HCl (BUSPAR PO) Take 10 mg by mouth 3 (three) times daily. Take 1/2- 1 tablet 3 times daily.      . calcium-vitamin D (OSCAL WITH D) 250-125 MG-UNIT per tablet Take 1 tablet by mouth daily.      . Cholecalciferol (VITAMIN D3) 2000 UNITS TABS Take 1 tablet by mouth 2 (two) times daily.       . citalopram (CELEXA) 20 MG tablet Take 20 mg by mouth daily.      . fexofenadine (ALLEGRA) 180 MG tablet Take 180 mg by mouth daily. 1/2 tab at night      . levothyroxine (SYNTHROID, LEVOTHROID) 50 MCG tablet Take 50 mcg by mouth daily.      . Magnesium 250 MG TABS Take by mouth.      . Multiple Vitamin (MULITIVITAMIN WITH MINERALS) TABS Take 1  tablet by mouth daily.      [name] Kitchen omeprazole (PRILOSEC) 20 MG capsule Take 40 mg by mouth daily.       . Probiotic Product (PROBIOTIC FORMULA PO) Take by mouth.      . TERAZOSIN HCL PO Take 10 mg by mouth at bedtime.        No current facility-administered medications on file prior to visit.     Allergies  Allergen Reactions  . Aspirin   . Celebrex [Celecoxib]   . Fosamax [Alendronate Sodium]   . Keflex [Cephalexin]   . Penicillins   . Prednisone   . Prevacid [Lansoprazole]   . Sulfa Antibiotics   . Tetanus Toxoids   . Tetracyclines & Related     PMHx:   Past Medical History  Diagnosis Date  . Abnormal EKG     LBBB  . H/O echocardiogram   . Ulcer   . Allergy   . Arthritis   . Hypertension   . Thyroid disease   . Hyperlipidemia   . GERD (gastroesophageal reflux disease)   . Anxiety   . Depression   . COPD (chronic obstructive pulmonary disease)   . Osteoporosis   . Prediabetes   . Venous insufficiency   . Vitamin D deficiency     FHx:    Reviewed / unchanged  SHx:  Reviewed / unchanged  Systems Review: Constitutional: Denies fever, chills, wt changes, headaches, insomnia, fatigue, night sweats, change in appetite. Eyes: Denies redness, blurred vision, diplopia, discharge, itchy, watery eyes.  ENT: Denies discharge, congestion, post nasal drip, epistaxis, sore throat, earache, hearing loss, dental pain, tinnitus, vertigo, sinus pain, snoring.  CV: Denies chest pain, palpitations, irregular heartbeat, syncope, dyspnea, diaphoresis, orthopnea, PND, claudication, edema. Respiratory: denies cough, dyspnea, DOE, pleurisy, hoarseness, laryngitis, wheezing.  Gastrointestinal: Denies dysphagia, odynophagia, heartburn, reflux, water brash, abdominal pain or cramps, nausea, vomiting, bloating, diarrhea, constipation, hematemesis, melena, hematochezia,  or hemorrhoids. Genitourinary: Denies dysuria, frequency, urgency, nocturia, hesitancy, discharge, hematuria, flank  pain. Musculoskeletal: Denies arthralgias, myalgias, stiffness, jt. swelling, pain, limp, strain/sprain.  Skin: Denies pruritus, rash, hives, warts, acne, eczema, change in skin lesion(s). Neuro: No weakness, tremor, incoordination, spasms, paresthesia, or pain. Psychiatric: Denies confusion, memory loss, or sensory loss. Endo: Denies change in weight, skin, hair change.  Heme/Lymph: No excessive bleeding, bruising, orenlarged lymph nodes.  Filed Vitals:   11/14/13 1227  BP: 138/84  Pulse: 76  Temp: 97.9 F (36.6 C)  Resp: 18    Estimated body mass index is 33.95 kg/(m^2) as calculated from the following:   Height as of this encounter: 5\' 1"  (1.549 m).   Weight as of this encounter: 179 lb 9.6 oz (81.466 kg).  On Exam: Appears well nourished - in no distress. Eyes: PERRLA, EOMs, conjunctiva no swelling or erythema. Sinuses: No frontal/maxillary tenderness ENT/Mouth: EAC's clear, TM's nl w/o erythema, bulging. Nares clear w/o erythema, swelling, exudates. Oropharynx clear without erythema or exudates. Oral hygiene is good. Tongue normal, non obstructing. Hearing intact.  Neck: Supple. Thyroid nl. Car 2+/2+ without bruits, nodes or JVD. Chest: Respirations nl with BS clear & equal w/o rales, rhonchi, wheezing or stridor.  Cor: Heart sounds normal w/ regular rate and rhythm without sig. murmurs, gallops, clicks, or rubs. Peripheral pulses normal and equal  without edema.  Abdomen: Soft & bowel sounds normal. Non-tender w/o guarding, rebound, hernias, masses, or organomegaly.  Lymphatics: Unremarkable.  Musculoskeletal: Full ROM all peripheral extremities, joint stability, 5/5 strength, and normal gait.  Skin: Warm, dry without exposed rashes, lesions, ecchymosis apparent.  Neuro: Cranial nerves intact, reflexes equal bilaterally. Sensory-motor testing grossly intact. Tendon reflexes grossly intact.  Pysch: Alert & oriented x 3. Insight and judgement nl & appropriate. No  ideations.  Assessment and Plan:  1. Hypertension - Continue monitor blood pressure at home. Continue diet/meds same.  2. Hyperlipidemia - Continue diet/meds, exercise,& lifestyle modifications. Continue monitor periodic cholesterol/liver & renal functions   3. Pre-diabetes - Continue diet, exercise, lifestyle modifications. Monitor appropriate labs.  3. UTI, recurrent - recheck U/A & C&S  4. Vitamin D Deficiency - Continue supplementation.  Recommended regular exercise, BP monitoring, weight control, and discussed med and SE's. Recommended labs to assess and monitor clinical status. Further disposition pending results of labs.    Patient presents for evaluation of  Lesion Rt lower cheek as above  Exam: approx. 10 mm x 10 mm raised pink crusty irritated appearing lesion at lower margin of Rt jaw    The risks, benefits, indications, potential complications, and alternatives were explained to the patient.  Procedure Details  After informed consent and local anesthesia with  marcaine with epi 1%, the area was prepped by sterile/aseptic technique with isopropyl alcohol.  The above lesion was excised by shave technique with a #11 blade and then the wound base was lightly hyfrecated for hemostasis and electrodestruction of any  residual lesion fragments.  Condition: Stable  Complications:  None  Diagnosis: skin lesion of ukn. Significance - probably irritated seborrheic keratoses  Procedure code: 04540  Plan: 1. Patient was instructed to keep the wound dry and covered for 24-48 hours and clean thereafter. 2. Warning signs of infection were reviewed & pt advised to call if any questions/problems.

## 2013-11-14 NOTE — Patient Instructions (Signed)
Hypertension As your heart beats, it forces blood through your arteries. This force is your blood pressure. If the pressure is too high, it is called hypertension (HTN) or high blood pressure. HTN is dangerous because you may have it and not know it. High blood pressure may mean that your heart has to work harder to pump blood. Your arteries may be narrow or stiff. The extra work puts you at risk for heart disease, stroke, and other problems.  Blood pressure consists of two numbers, a higher number over a lower, 110/72, for example. It is stated as "110 over 72." The ideal is below 120 for the top number (systolic) and under 80 for the bottom (diastolic). Write down your blood pressure today. You should pay close attention to your blood pressure if you have certain conditions such as:  Heart failure.  Prior heart attack.  Diabetes  Chronic kidney disease.  Prior stroke.  Multiple risk factors for heart disease. To see if you have HTN, your blood pressure should be measured while you are seated with your arm held at the level of the heart. It should be measured at least twice. A one-time elevated blood pressure reading (especially in the Emergency Department) does not mean that you need treatment. There may be conditions in which the blood pressure is different between your right and left arms. It is important to see your caregiver soon for a recheck. Most people have essential hypertension which means that there is not a specific cause. This type of high blood pressure may be lowered by changing lifestyle factors such as:  Stress.  Smoking.  Lack of exercise.  Excessive weight.  Drug/tobacco/alcohol use.  Eating less salt. Most people do not have symptoms from high blood pressure until it has caused damage to the body. Effective treatment can often prevent, delay or reduce that damage. TREATMENT  When a cause has been identified, treatment for high blood pressure is directed at the  cause. There are a large number of medications to treat HTN. These fall into several categories, and your caregiver will help you select the medicines that are best for you. Medications may have side effects. You should review side effects with your caregiver. If your blood pressure stays high after you have made lifestyle changes or started on medicines,   Your medication(s) may need to be changed.  Other problems may need to be addressed.  Be certain you understand your prescriptions, and know how and when to take your medicine.  Be sure to follow up with your caregiver within the time frame advised (usually within two weeks) to have your blood pressure rechecked and to review your medications.  If you are taking more than one medicine to lower your blood pressure, make sure you know how and at what times they should be taken. Taking two medicines at the same time can result in blood pressure that is too low. SEEK IMMEDIATE MEDICAL CARE IF:  You develop a severe headache, blurred or changing vision, or confusion.  You have unusual weakness or numbness, or a faint feeling.  You have severe chest or abdominal pain, vomiting, or breathing problems. MAKE SURE YOU:   Understand these instructions.  Will watch your condition.  Will get help right away if you are not doing well or get worse. Document Released: 11/23/2005 Document Revised: 02/15/2012 Document Reviewed: 07/13/2008 [employer] Patient Information 2014 [employer].  Diabetes and Exercise Exercising regularly is important. It is not just about losing weight. It   has many health benefits, such as:  Improving your overall fitness, flexibility, and endurance.  Increasing your bone density.  Helping with weight control.  Decreasing your body fat.  Increasing your muscle strength.  Reducing stress and tension.  Improving your overall health. People with diabetes who exercise gain additional benefits because  exercise:  Reduces appetite.  Improves the body's use of blood sugar (glucose).  Helps lower or control blood glucose.  Decreases blood pressure.  Helps control blood lipids (such as cholesterol and triglycerides).  Improves the body's use of the hormone insulin by:  Increasing the body's insulin sensitivity.  Reducing the body's insulin needs.  Decreases the risk for heart disease because exercising:  Lowers cholesterol and triglycerides levels.  Increases the levels of good cholesterol (such as high-density lipoproteins [HDL]) in the body.  Lowers blood glucose levels. YOUR ACTIVITY PLAN  Choose an activity that you enjoy and set realistic goals. Your health care provider or diabetes educator can help you make an activity plan that works for you. You can break activities into 2 or 3 sessions throughout the day. Doing so is as good as one long session. Exercise ideas include:  Taking the dog for a walk.  Taking the stairs instead of the elevator.  Dancing to your favorite song.  Doing your favorite exercise with a friend. RECOMMENDATIONS FOR EXERCISING WITH TYPE 1 OR TYPE 2 DIABETES   Check your blood glucose before exercising. If blood glucose levels are greater than 240 mg/dL, check for urine ketones. Do not exercise if ketones are present.  Avoid injecting insulin into areas of the body that are going to be exercised. For example, avoid injecting insulin into:  The arms when playing tennis.  The legs when jogging.  Keep a record of:  Food intake before and after you exercise.  Expected peak times of insulin action.  Blood glucose levels before and after you exercise.  The type and amount of exercise you have done.  Review your records with your health care provider. Your health care provider will help you to develop guidelines for adjusting food intake and insulin amounts before and after exercising.  If you take insulin or oral hypoglycemic agents, watch  for signs and symptoms of hypoglycemia. They include:  Dizziness.  Shaking.  Sweating.  Chills.  Confusion.  Drink plenty of water while you exercise to prevent dehydration or heat stroke. Body water is lost during exercise and must be replaced.  Talk to your health care provider before starting an exercise program to make sure it is safe for you. Remember, almost any type of activity is better than none. Document Released: 02/13/2004 Document Revised: 07/26/2013 Document Reviewed: 05/02/2013 Exit[employer] Patient Information 2014 [employer].  Cholesterol Cholesterol is a white, waxy, fat-like protein needed by your body in small amounts. The liver makes all the cholesterol you need. It is carried from the liver by the blood through the blood vessels. Deposits (plaque) may build up on blood vessel walls. This makes the arteries narrower and stiffer. Plaque increases the risk for heart attack and stroke. You cannot feel your cholesterol level even if it is very high. The only way to know is by a blood test to check your lipid (fats) levels. Once you know your cholesterol levels, you should keep a record of the test results. Work with your caregiver to to keep your levels in the desired range. WHAT THE RESULTS MEAN:  Total cholesterol is a rough measure of all the cholesterol   in your blood.  LDL is the so-called bad cholesterol. This is the type that deposits cholesterol in the walls of the arteries. You want this level to be low.  HDL is the good cholesterol because it cleans the arteries and carries the LDL away. You want this level to be high.  Triglycerides are fat that the body can either burn for energy or store. High levels are closely linked to heart disease. DESIRED LEVELS:  Total cholesterol below 200.  LDL below 100 for people at risk, below 70 for very high risk.  HDL above 50 is good, above 60 is best.  Triglycerides below 150. HOW TO LOWER YOUR  CHOLESTEROL:  Diet.  Choose fish or white meat chicken and turkey, roasted or baked. Limit fatty cuts of red meat, fried foods, and processed meats, such as sausage and lunch meat.  Eat lots of fresh fruits and vegetables. Choose whole grains, beans, pasta, potatoes and cereals.  Use only small amounts of olive, corn or canola oils. Avoid butter, mayonnaise, shortening or palm kernel oils. Avoid foods with trans-fats.  Use skim/nonfat milk and low-fat/nonfat yogurt and cheeses. Avoid whole milk, cream, ice cream, egg yolks and cheeses. Healthy desserts include angel food cake, ginger snaps, animal crackers, hard candy, popsicles, and low-fat/nonfat frozen yogurt. Avoid pastries, cakes, pies and cookies.  Exercise.  A regular program helps decrease LDL and raises HDL.  Helps with weight control.  Do things that increase your activity level like gardening, walking, or taking the stairs.  Medication.  May be prescribed by your caregiver to help lowering cholesterol and the risk for heart disease.  You may need medicine even if your levels are normal if you have several risk factors. HOME CARE INSTRUCTIONS   Follow your diet and exercise programs as suggested by your caregiver.  Take medications as directed.  Have blood work done when your caregiver feels it is necessary. MAKE SURE YOU:   Understand these instructions.  Will watch your condition.  Will get help right away if you are not doing well or get worse. Document Released: 08/18/2001 Document Revised: 02/15/2012 Document Reviewed: 02/08/2008 [employer] Patient Information 2014 [employer].  Vitamin D Deficiency Vitamin D is an important vitamin that your body needs. Having too little of it in your body is called a deficiency. A very bad deficiency can make your bones soft and can cause a condition called rickets.  Vitamin D is important to your body for different reasons, such as:   It helps your body absorb 2  minerals called calcium and phosphorus.  It helps make your bones healthy.  It may prevent some diseases, such as diabetes and multiple sclerosis.  It helps your muscles and heart. You can get vitamin D in several ways. It is a natural part of some foods. The vitamin is also added to some dairy products and cereals. Some people take vitamin D supplements. Also, your body makes vitamin D when you are in the sun. It changes the sun's rays into a form of the vitamin that your body can use. CAUSES   Not eating enough foods that contain vitamin D.  Not getting enough sunlight.  Having certain digestive system diseases that make it hard to absorb vitamin D. These diseases include Crohn's disease, chronic pancreatitis, and cystic fibrosis.  Having a surgery in which part of the stomach or small intestine is removed.  Being obese. Fat cells pull vitamin D out of your blood. That means that obese people   may not have enough vitamin D left in their blood and in other body tissues.  Having chronic kidney or liver disease. RISK FACTORS Risk factors are things that make you more likely to develop a vitamin D deficiency. They include:  Being older.  Not being able to get outside very much.  Living in a nursing home.  Having had broken bones.  Having weak or thin bones (osteoporosis).  Having a disease or condition that changes how your body absorbs vitamin D.  Having dark skin.  Some medicines such as seizure medicines or steroids.  Being overweight or obese. SYMPTOMS Mild cases of vitamin D deficiency may not have any symptoms. If you have a very bad case, symptoms may include:  Bone pain.  Muscle pain.  Falling often.  Broken bones caused by a minor injury, due to osteoporosis. DIAGNOSIS A blood test is the best way to tell if you have a vitamin D deficiency. TREATMENT Vitamin D deficiency can be treated in different ways. Treatment for vitamin D deficiency depends on what is  causing it. Options include:  Taking vitamin D supplements.  Taking a calcium supplement. Your caregiver will suggest what dose is best for you. HOME CARE INSTRUCTIONS  Take any supplements that your caregiver prescribes. Follow the directions carefully. Take only the suggested amount.  Have your blood tested 2 months after you start taking supplements.  Eat foods that contain vitamin D. Healthy choices include:  Fortified dairy products, cereals, or juices. Fortified means vitamin D has been added to the food. Check the label on the package to be sure.  Fatty fish like salmon or trout.  Eggs.  Oysters.  Do not use a tanning bed.  Keep your weight at a healthy level. Lose weight if you need to.  Keep all follow-up appointments. Your caregiver will need to perform blood tests to make sure your vitamin D deficiency is going away. SEEK MEDICAL CARE IF:  You have any questions about your treatment.  You continue to have symptoms of vitamin D deficiency.  You have nausea or vomiting.  You are constipated.  You feel confused.  You have severe abdominal or back pain. MAKE SURE YOU:  Understand these instructions.  Will watch your condition.  Will get help right away if you are not doing well or get worse. Document Released: 02/15/2012 Document Revised: 03/20/2013 Document Reviewed: 02/15/2012 [employer] Patient Information 2014 [employer].  

## 2013-11-15 LAB — VITAMIN D 25 HYDROXY (VIT D DEFICIENCY, FRACTURES): Vit D, 25-Hydroxy: 76 ng/mL (ref 30–89)

## 2013-11-15 LAB — INSULIN, FASTING: Insulin fasting, serum: 7 u[IU]/mL (ref 3–28)

## 2013-11-15 LAB — TSH: TSH: 4.271 u[IU]/mL (ref 0.350–4.500)

## 2013-11-21 ENCOUNTER — Ambulatory Visit (INDEPENDENT_AMBULATORY_CARE_PROVIDER_SITE_OTHER): Payer: Medicare Other | Admitting: *Deleted

## 2013-11-21 DIAGNOSIS — N3000 Acute cystitis without hematuria: Secondary | ICD-10-CM

## 2013-11-22 LAB — URINALYSIS, MICROSCOPIC ONLY
Casts: NONE SEEN
Crystals: NONE SEEN
Squamous Epithelial / LPF: NONE SEEN
WBC, UA: >50 WBC/hpf — AB (ref <3)

## 2013-11-22 LAB — URINALYSIS, ROUTINE W REFLEX MICROSCOPIC
Bilirubin Urine: NEGATIVE
Glucose, UA: NEGATIVE mg/dL
Hgb urine dipstick: NEGATIVE
Ketones, ur: NEGATIVE mg/dL
Nitrite: NEGATIVE
Protein, ur: NEGATIVE mg/dL
Specific Gravity, Urine: 1.013 (ref 1.005–1.030)
Urobilinogen, UA: 0.2 mg/dL (ref 0.0–1.0)
pH: 7.5 (ref 5.0–8.0)

## 2013-11-23 LAB — URINE CULTURE
Colony Count: NO GROWTH
Organism ID, Bacteria: NO GROWTH

## 2014-02-18 ENCOUNTER — Other Ambulatory Visit: Payer: Self-pay | Admitting: Internal Medicine

## 2014-02-28 ENCOUNTER — Ambulatory Visit: Payer: Self-pay | Admitting: Emergency Medicine

## 2014-03-01 ENCOUNTER — Encounter: Payer: Self-pay | Admitting: Internal Medicine

## 2014-03-20 ENCOUNTER — Encounter: Payer: Self-pay | Admitting: Internal Medicine

## 2014-03-20 ENCOUNTER — Ambulatory Visit (INDEPENDENT_AMBULATORY_CARE_PROVIDER_SITE_OTHER): Payer: Medicare Other | Admitting: Internal Medicine

## 2014-03-20 VITALS — BP 136/80 | HR 80 | Temp 98.1°F | Resp 16 | Ht 61.0 in | Wt 179.8 lb

## 2014-03-20 DIAGNOSIS — Z79899 Other long term (current) drug therapy: Secondary | ICD-10-CM | POA: Insufficient documentation

## 2014-03-20 DIAGNOSIS — E559 Vitamin D deficiency, unspecified: Secondary | ICD-10-CM

## 2014-03-20 DIAGNOSIS — R7303 Prediabetes: Secondary | ICD-10-CM

## 2014-03-20 DIAGNOSIS — R7309 Other abnormal glucose: Secondary | ICD-10-CM

## 2014-03-20 DIAGNOSIS — E785 Hyperlipidemia, unspecified: Secondary | ICD-10-CM

## 2014-03-20 DIAGNOSIS — I10 Essential (primary) hypertension: Secondary | ICD-10-CM

## 2014-03-20 LAB — CBC WITH DIFFERENTIAL/PLATELET
Basophils Absolute: 0 10*3/uL (ref 0.0–0.1)
Basophils Relative: 0 % (ref 0–1)
Eosinophils Absolute: 0.1 10*3/uL (ref 0.0–0.7)
Eosinophils Relative: 1 % (ref 0–5)
HCT: 38.8 % (ref 36.0–46.0)
Hemoglobin: 12.9 g/dL (ref 12.0–15.0)
Lymphocytes Relative: 29 % (ref 12–46)
Lymphs Abs: 1.5 10*3/uL (ref 0.7–4.0)
MCH: 29.7 pg (ref 26.0–34.0)
MCHC: 33.2 g/dL (ref 30.0–36.0)
MCV: 89.4 fL (ref 78.0–100.0)
Monocytes Absolute: 0.5 10*3/uL (ref 0.1–1.0)
Monocytes Relative: 10 % (ref 3–12)
Neutro Abs: 3 10*3/uL (ref 1.7–7.7)
Neutrophils Relative %: 60 % (ref 43–77)
Platelets: 181 10*3/uL (ref 150–400)
RBC: 4.34 MIL/uL (ref 3.87–5.11)
RDW: 14 % (ref 11.5–15.5)
WBC: 5 10*3/uL (ref 4.0–10.5)

## 2014-03-20 NOTE — Patient Instructions (Signed)

## 2014-03-20 NOTE — Progress Notes (Signed)
Patient ID: Alyssa Schmitt, female   DOB: October 14, 1928, 80 y.o.   MRN: [id number]    This very nice 80 y.o. [name] presents for follow up with Hypertension, Hyperlipidemia, Hypothyroidism, Pre-Diabetes and Vitamin D Deficiency.    HTN predates since 50. BP has been controlled at home. Today's BP: 136/80 mmHg . Patient denies any cardiac type chest pain, palpitations, dyspnea/orthopnea/PND, dizziness, claudication, or dependent edema.   Hyperlipidemia is managed with diet and last Chol was 158, TG 105, HDL  51  and LDL 86 in Dec 2014 - all at goal. Patient denies myalgias or other med SE's.    Also, the patient has history of PreDiabetes with A1c 5.7% in Feb 2014 and with last A1c of    . Patient denies any symptoms of reactive hypoglycemia, diabetic polys, paresthesias or visual blurring.   Further, Patient has history of Vitamin D Deficiency with last vitamin D of 5.6% in Feb 2015. Patient supplements vitamin D without any suspected side-effects.  Medication Sig  . busPIRone (BUSPAR) 10 MG tablet TAKE 1/2 - 1 TABLET 3 TIMES A DAY AS NEEDED FOR ANXIETY  . calcium-vitamin D (OSCAL WITH D) 250-125 MG Take 1 tablet by mouth daily.  . Cholecalciferol (VITAMIN D3) 2000 UNITS TABS Take 1 tablet by mouth 2 (two) times daily.   . citalopram (CELEXA) 20 MG tablet Take 20 mg by mouth daily.  . EUFLEXXA 20 MG/2ML SOSY   . fexofenadine (ALLEGRA) 180 MG tablet Take 180 mg by mouth daily. 1/2 tab at night  . levothyroxine  50 MCG tablet Take 50 mcg by mouth daily.  . Magnesium 250 MG TABS Take by mouth.  Alyssa Schmitt WITH MINERALS) TABS Take 1 tablet by mouth daily.  [name] Kitchen omeprazole (PRILOSEC) 20 MG capsule Take 40 mg by mouth daily.   . Probiotic Product (PROBIOTIC FORMULA PO) Take by mouth.  . TERAZOSIN HCL PO Take 10 mg by mouth at bedtime.   . traMADol (ULTRAM) 50 MG tablet 1/2 to 1 tablet up to 4 x daily as needed for pain    Allergies  Allergen Reactions  . Aspirin   . Celebrex [Celecoxib]   .  Fosamax [Alendronate Sodium]   . Keflex [Cephalexin]   . Penicillins   . Prednisone   . Prevacid [Lansoprazole]   . Sulfa Antibiotics   . Tetanus Toxoids   . Tetracyclines & Related     PMHx:   Past Medical History  Diagnosis Date  . Abnormal EKG     LBBB  . H/O echocardiogram   . Ulcer   . Allergy   . Arthritis   . Hypertension   . Thyroid disease   . Hyperlipidemia   . GERD (gastroesophageal reflux disease)   . Anxiety   . Depression   . COPD (chronic obstructive pulmonary disease)   . Osteoporosis   . Prediabetes   . Venous insufficiency   . Vitamin D deficiency     FHx:    Reviewed / unchanged  SHx:    Reviewed / unchanged   Systems Review: Constitutional: Denies fever, chills, wt changes, headaches, insomnia, fatigue, night sweats, change in appetite. Eyes: Denies redness, blurred vision, diplopia, discharge, itchy, watery eyes.  ENT: Denies discharge, congestion, post nasal drip, epistaxis, sore throat, earache, hearing loss, dental pain, tinnitus, vertigo, sinus pain, snoring.  CV: Denies chest pain, palpitations, irregular heartbeat, syncope, dyspnea, diaphoresis, orthopnea, PND, claudication, edema. Respiratory: denies cough, dyspnea, DOE, pleurisy, hoarseness, laryngitis, wheezing.  Gastrointestinal: Denies dysphagia, odynophagia,  heartburn, reflux, water brash, abdominal pain or cramps, nausea, vomiting, bloating, diarrhea, constipation, hematemesis, melena, hematochezia,  or hemorrhoids. Genitourinary: Denies dysuria, frequency, urgency, nocturia, hesitancy, discharge, hematuria, flank pain. Musculoskeletal: Denies arthralgias, myalgias, stiffness, jt. swelling, pain, limp, strain/sprain.  Skin: Denies pruritus, rash, hives, warts, acne, eczema, change in skin lesion(s). Neuro: No weakness, tremor, incoordination, spasms, paresthesia, or pain. Psychiatric: Denies confusion, memory loss, or sensory loss. Endo: Denies change in weight, skin, hair change.   Heme/Lymph: No excessive bleeding, bruising, orenlarged lymph nodes.   Exam:  BP 136/80  Pulse 80  Temp 98.1 F   Resp 16  Ht 5\' 1"    Wt 179 lb 12.8 oz   BMI 33.99 kg/m2  Appears well nourished - in no distress. Eyes: PERRLA, EOMs, conjunctiva no swelling or erythema. Sinuses: No frontal/maxillary tenderness ENT/Mouth: EAC's clear, TM's nl w/o erythema, bulging. Nares clear w/o erythema, swelling, exudates. Oropharynx clear without erythema or exudates. Oral hygiene is good. Tongue normal, non obstructing. Hearing intact.  Neck: Supple. Thyroid nl. Car 2+/2+ without bruits, nodes or JVD. Chest: Respirations nl with BS clear & equal w/o rales, rhonchi, wheezing or stridor.  Cor: Heart sounds normal w/ regular rate and rhythm without sig. murmurs, gallops, clicks, or rubs. Peripheral pulses normal and equal  without edema.  Abdomen: Soft & bowel sounds normal. Non-tender w/o guarding, rebound, hernias, masses, or organomegaly.  Lymphatics: Unremarkable.  Musculoskeletal: Full ROM all peripheral extremities, joint stability, 5/5 strength, and normal gait.  Skin: Warm, dry without exposed rashes, lesions, ecchymosis apparent.  Neuro: Cranial nerves intact, reflexes equal bilaterally. Sensory-motor testing grossly intact. Tendon reflexes grossly intact.  Pysch: Alert & oriented x 3. Insight and judgement nl & appropriate. No ideations.  Assessment and Plan:  1. Hypertension - Continue monitor blood pressure at home. Continue diet/meds same.  2. Hyperlipidemia - Continue diet/meds, exercise,& lifestyle modifications. Continue monitor periodic cholesterol/liver & renal functions   3. Pre-diabetes - Continue diet, exercise, lifestyle modifications. Monitor appropriate labs.  4. Vitamin D Deficiency - Continue supplementation.  5. Hypothyroidism  Recommended regular exercise, BP monitoring, weight control, and discussed med and SE's. Recommended labs to assess and monitor clinical  status. Further disposition pending results of labs.

## 2014-03-21 LAB — LIPID PANEL
Cholesterol: 147 mg/dL (ref 0–200)
HDL: 48 mg/dL (ref >39)
LDL Cholesterol: 83 mg/dL (ref 0–99)
Total CHOL/HDL Ratio: 3.1 Ratio
Triglycerides: 80 mg/dL (ref <150)
VLDL: 16 mg/dL (ref 0–40)

## 2014-03-21 LAB — BASIC METABOLIC PANEL WITH GFR
BUN: 19 mg/dL (ref 6–23)
CO2: 28 mEq/L (ref 19–32)
Calcium: 9.9 mg/dL (ref 8.4–10.5)
Chloride: 100 mEq/L (ref 96–112)
Creat: 0.95 mg/dL (ref 0.50–1.10)
GFR, Est African American: 64 mL/min
GFR, Est Non African American: 56 mL/min — ABNORMAL LOW
Glucose, Bld: 76 mg/dL (ref 70–99)
Potassium: 4.6 mEq/L (ref 3.5–5.3)
Sodium: 138 mEq/L (ref 135–145)

## 2014-03-21 LAB — HEPATIC FUNCTION PANEL
ALT: 20 U/L (ref 0–35)
AST: 35 U/L (ref 0–37)
Albumin: 4.1 g/dL (ref 3.5–5.2)
Alkaline Phosphatase: 71 U/L (ref 39–117)
Bilirubin, Direct: 0.2 mg/dL (ref 0.0–0.3)
Indirect Bilirubin: 0.4 mg/dL (ref 0.2–1.2)
Total Bilirubin: 0.6 mg/dL (ref 0.2–1.2)
Total Protein: 7.4 g/dL (ref 6.0–8.3)

## 2014-03-21 LAB — HEMOGLOBIN A1C
Hgb A1c MFr Bld: 5.6 % (ref <5.7)
Mean Plasma Glucose: 114 mg/dL (ref <117)

## 2014-03-21 LAB — MAGNESIUM: Magnesium: 1.8 mg/dL (ref 1.5–2.5)

## 2014-03-21 LAB — VITAMIN D 25 HYDROXY (VIT D DEFICIENCY, FRACTURES): Vit D, 25-Hydroxy: 86 ng/mL (ref 30–89)

## 2014-03-21 LAB — INSULIN, FASTING: Insulin fasting, serum: 12 u[IU]/mL (ref 3–28)

## 2014-03-21 LAB — TSH: TSH: 2.073 u[IU]/mL (ref 0.350–4.500)

## 2014-03-27 ENCOUNTER — Telehealth: Payer: Self-pay | Admitting: *Deleted

## 2014-03-27 NOTE — Telephone Encounter (Signed)
Patient aware of labs.  

## 2014-04-06 ENCOUNTER — Ambulatory Visit (INDEPENDENT_AMBULATORY_CARE_PROVIDER_SITE_OTHER): Payer: Medicare Other | Admitting: Physician Assistant

## 2014-04-06 ENCOUNTER — Encounter: Payer: Self-pay | Admitting: Physician Assistant

## 2014-04-06 VITALS — BP 138/78 | HR 76 | Temp 98.1°F | Resp 16 | Wt 178.0 lb

## 2014-04-06 DIAGNOSIS — H1045 Other chronic allergic conjunctivitis: Secondary | ICD-10-CM

## 2014-04-06 DIAGNOSIS — H1010 Acute atopic conjunctivitis, unspecified eye: Secondary | ICD-10-CM

## 2014-04-06 MED ORDER — DEXAMETHASONE SODIUM PHOSPHATE 0.1 % OP SOLN: 2.0000 [drp] | Freq: Three times a day (TID) | OPHTHALMIC | Status: DC

## 2014-04-06 NOTE — Patient Instructions (Signed)

## 2014-04-06 NOTE — Progress Notes (Signed)
   Subjective:    Patient ID: Alyssa Schmitt, female    DOB: February 18, 1928, 80 y.o.   MRN: [id number]  Eye Problem  Both (left more than right) eyes are affected.This is a new problem. Episode onset: 2 weeks. The problem occurs constantly. The problem has been unchanged. There was no injury mechanism. The patient is experiencing no pain. There is no known exposure to pink eye. She does not wear contacts. Associated symptoms include an eye discharge, eye redness and itching. Pertinent negatives include no blurred vision, double vision, fever, foreign body sensation, nausea, photophobia, recent URI or vomiting. Treatments tried: Dr. Herbert Deaner, last seen 3 years ago.    Review of Systems  Constitutional: Negative.  Negative for fever.  HENT: Negative.   Eyes: Positive for discharge, redness and itching. Negative for blurred vision, double vision, photophobia, pain and visual disturbance.  Respiratory: Negative.   Cardiovascular: Negative.   Gastrointestinal: Negative.  Negative for nausea and vomiting.  Genitourinary: Negative.   Musculoskeletal: Negative.   Skin: Positive for itching.  Neurological: Negative.        Objective:   Physical Exam  Constitutional: She appears well-developed and well-nourished. She appears lethargic.  HENT:  Head: Normocephalic and atraumatic.  Right Ear: External ear normal.  Left Ear: External ear normal.  Eyes: EOM are normal. Pupils are equal, round, and reactive to light. Left eye exhibits discharge and exudate. Left eye exhibits no hordeolum. No foreign body present in the left eye. Left conjunctiva is injected. Left conjunctiva has no hemorrhage. No scleral icterus. Left eye exhibits normal extraocular motion and no nystagmus. Left pupil is round and reactive. Pupils are equal.  Neck: Normal range of motion. Neck supple.  Neurological: She has normal strength and normal reflexes. She appears lethargic. No cranial nerve deficit or sensory deficit. She  displays a negative Romberg sign.      Assessment & Plan:  Conjunctivitis- Hygiene explained, if symptoms increase Eye Doctor ASAP, Ocuflox drops AD 10 mL

## 2014-05-01 ENCOUNTER — Encounter: Payer: Self-pay | Admitting: Physician Assistant

## 2014-05-01 ENCOUNTER — Ambulatory Visit (INDEPENDENT_AMBULATORY_CARE_PROVIDER_SITE_OTHER): Payer: Medicare Other | Admitting: Physician Assistant

## 2014-05-01 VITALS — BP 148/72 | HR 80 | Temp 98.0°F | Resp 16 | Ht 61.0 in | Wt 175.0 lb

## 2014-05-01 DIAGNOSIS — H1010 Acute atopic conjunctivitis, unspecified eye: Secondary | ICD-10-CM

## 2014-05-01 DIAGNOSIS — M26609 Unspecified temporomandibular joint disorder, unspecified side: Secondary | ICD-10-CM

## 2014-05-01 DIAGNOSIS — H1045 Other chronic allergic conjunctivitis: Secondary | ICD-10-CM

## 2014-05-01 DIAGNOSIS — J329 Chronic sinusitis, unspecified: Secondary | ICD-10-CM

## 2014-05-01 MED ORDER — AZITHROMYCIN 250 MG PO TABS: ORAL_TABLET | ORAL | Status: DC

## 2014-05-01 NOTE — Patient Instructions (Signed)
What is the TMJ? The temporomandibular (tem-PUH-ro-man-DIB-yoo-ler) joint, or the TMJ, connects the upper and lower jawbones. This joint allows the jaw to open wide and move back and forth when you chew, talk, or yawn.There are also several muscles that help this joint move. There can be muscle tightness and pain in the muscle that can cause several symptoms.  What causes TMJ pain? There are many causes of TMJ pain. Repeated chewing (for example, chewing gum) and clenching your teeth can cause pain in the joint. Some TMJ pain has no obvious cause. What can I do to ease the pain? There are many things you can do to help your pain get better. When you have pain:  Eat soft foods and stay away from chewy foods (for example, taffy) Try to use both sides of your mouth to chew Don't chew gum Massage Don't open your mouth wide (for example, during yawning or singing) Don't bite your cheeks or fingernails Lower your amount of stress and worry Applying a warm, damp washcloth to the joint may help. Over-the-counter pain medicines such as ibuprofen (one brand: Advil) or acetaminophen (one brand: Tylenol) might also help. Do not use these medicines if you are allergic to them or if your doctor told you not to use them. How can I stop the pain from coming back? When your pain is better, you can do these exercises to make your muscles stronger and to keep the pain from coming back:  Resisted mouth opening: Place your thumb or two fingers under your chin and open your mouth slowly, pushing up lightly on your chin with your thumb. Hold for three to six seconds. Close your mouth slowly. Resisted mouth closing: Place your thumbs under your chin and your two index fingers on the ridge between your mouth and the bottom of your chin. Push down lightly on your chin as you close your mouth. Tongue up: Slowly open and close your mouth while keeping the tongue touching the roof of the mouth. Side-to-side jaw movement:  Place an object about one fourth of an inch thick (for example, two tongue depressors) between your front teeth. Slowly move your jaw from side to side. Increase the thickness of the object as the exercise becomes easier Forward jaw movement: Place an object about one fourth of an inch thick between your front teeth and move the bottom jaw forward so that the bottom teeth are in front of the top teeth. Increase the thickness of the object as the exercise becomes easier. These exercises should not be painful. If it hurts to do these exercises, stop doing them and talk to your family doctor.   The majority of colds are caused by viruses and do not require antibiotics. Please read the rest of this hand out to learn more about the common cold and what you can do to help yourself as well as help prevent the over use of antibiotics.   COMMON COLD SIGNS AND SYMPTOMS - The common cold usually causes nasal congestion, runny nose, and sneezing. A sore throat may be present on the first day but usually resolves quickly. If a cough occurs, it generally develops on about the fourth or fifth day of symptoms, typically when congestion and runny nose are resolving  COMMON COLD COMPLICATIONS - In most cases, colds do not cause serious illness or complications. Most colds last for three to seven days, although many people continue to have symptoms (coughing, sneezing, congestion) for up to two weeks.  One of  the more common complications is sinusitis, which is usually caused by viruses and rarely (about 2 percent of the time) by bacteria. Having thick or yellow to green-colored nasal discharge does not mean that bacterial sinusitis has developed; discolored nasal discharge is a normal phase of the common cold.  Lower respiratory infections, such as pneumonia or bronchitis, may develop following a cold.  Infection of the middle ear, or otitis media, can accompany or follow a cold.  COMMON COLD TREATMENT - There is no  specific treatment for the viruses that cause the common cold. Most treatments are aimed at relieving some of the symptoms of the cold, but do not shorten or cure the cold. Antibiotics are not useful for treating the common cold; antibiotics are only used to treat illnesses caused by bacteria, not viruses. Unnecessary use of antibiotics for the treatment of the common cold can cause allergic reactions, diarrhea, or other gastrointestinal symptoms in some patients.  The symptoms of a cold will resolve over time, even without any treatment. People with underlying medical conditions and those who use other over-the-counter or prescription medications should speak with their healthcare provider or pharmacist to ensure that it is safe to use these treatments. The following are treatments that may reduce the symptoms caused by the common cold.  Nasal congestion - Decongestants are good for nasal congestion- if you feel very stuffy but no mucus is coming out, this is the medication that will help you the most.  Pseudoephedrine is a decongestant that can improve nasal congestion. Although a prescription is not required, drugstores in the [place] keep pseudoephedrine behind the counter, so it must be requested from a pharmacist. If you have a heart condition or high blood pressure please use Coricidin BPH instead.   Runny nose - Antihistamines such as diphenhydramine (Benadryl), certazine (Zyrtec) which are best taking at night because they can make you tired OR loratadine (Claritin),  fexafinadine (Allegra) help with a runny nose.   Nasal sprays such an oxymetazoline (Afrin and others) may also give temporary relief of nasal congestion. However, these sprays should never be used for more than two to three days; use for more than three days use can worsen congestion.  Nasocort is now over the counter and can help decrease a runny nose. Please stop the medication if you have blurry vision or nose bleeds.   Sore  throat and headache - Sore throat and headache are best treated with a mild pain reliever such as acetaminophen (Tylenol) or a non-steroidal anti-inflammatory agent such as ibuprofen or naproxen (Motrin or Aleve). These medications should be taken with food to prevent stomach problems. As well as gargling with warm water and salt.   Cough - Common cough medicine ingredients include guaifenesin and dextromethorphan; these are often combined with other medications in over-the-counter cold formulas. Often a cough is worse at night or first in the morning due to post nasal drip from you nose. You can try to sleep at an angle to decrease a cough.   Alternative treatments - Heated, humidified air can improve symptoms of nasal congestion and runny nose, and causes few to no side effects. A number of alternative products, including vitamin C, doubling up on your vitamin D and herbal products such as echinacea, may help. Certain products, such as nasal gels that contain zinc (eg, Zicam), have been associated with a permanent loss of smell.  Antibiotics - Antibiotics should not be used to treat an uncomplicated common cold. As noted above,  colds are caused by viruses. Antibiotics treat bacterial, not viral infections. Some viruses that cause the common cold can also depress the immune system or cause swelling in the lining of the nose or airways; this can, in turn, lead to a bacterial infection. Often you need to give your body 7 days to fight off a common cold while treating the symptoms with the medications listed above. If after 7 days your symptoms are not improving, you are getting worse, you have shortness of breath, chest pain, a fever of over 103 you should seek medical help immediately.   PREVENTION IS THE BEST MEDICINE - Hand washing is an essential and highly effective way to prevent the spread of infection.  Alcohol-based hand rubs are a good alternative for disinfecting hands if a sink is not available.   Hands should be washed before preparing food and eating and after coughing, blowing the nose, or sneezing. While it is not always possible to limit contact with people who may be infected with a cold, touching the eyes, nose, or mouth after direct contact should be avoided when possible. Sneezing/coughing into the sleeve of one's clothing (at the inner elbow) is another means of containing sprays of saliva and secretions and does not contaminate the hands.

## 2014-05-01 NOTE — Progress Notes (Signed)
   Subjective:    Patient ID: Alyssa Schmitt, female    DOB: 1928/05/13, 80 y.o.   MRN: [id number]  HPI 80 y.o. female presents for follow up, she was seen 04/06/14 for conjunctivitis, she was using drops but they were burning, she saw Dr. Herbert Deaner. It has not gotten better and her daughter states she also has been having chills and headaches/sinus pain.     Review of Systems  Constitutional: Positive for chills. Negative for fever and fatigue.  HENT: Positive for congestion, postnasal drip, rhinorrhea and sinus pressure. Negative for dental problem, drooling, ear discharge, ear pain, facial swelling, sneezing, sore throat and tinnitus.   Respiratory: Negative.   Cardiovascular: Negative.   Genitourinary: Negative.        Objective:   Physical Exam  Constitutional: She appears well-developed and well-nourished.  HENT:  Head: Normocephalic and atraumatic.  Right Ear: External ear normal.  Left Ear: External ear normal.  Nose: Right sinus exhibits maxillary sinus tenderness. Left sinus exhibits maxillary sinus tenderness.  + TMJ   Eyes: Conjunctivae are normal. Pupils are equal, round, and reactive to light.  Neck: Normal range of motion. Neck supple.  Cardiovascular: Normal rate and regular rhythm.   Pulmonary/Chest: Effort normal and breath sounds normal.  Abdominal: Soft. Bowel sounds are normal.       Assessment & Plan:  Sinusitis- zpak, call if worse  TMJ (temporomandibular joint syndrome) -information given to the patient, no gum/decrease hard foods, warm wet wash clothes, decrease stress, talk with dentist about possible night guard, can do massage, and exercise.

## 2014-05-04 ENCOUNTER — Other Ambulatory Visit: Payer: Self-pay | Admitting: Internal Medicine

## 2014-06-12 ENCOUNTER — Telehealth: Payer: Self-pay | Admitting: Internal Medicine

## 2014-06-12 NOTE — Telephone Encounter (Signed)
PATIENT STATES SHE WAS UNABLE TO SPEAK FOR APPROXIMATELY 15 MINUTES TODAY, AFTER URINATING, BEFORE LEAVING HER HOME WITH DAUGHTER.  HAS NEVER HAD THIS HAPPEN BEFORE, BUT HAS HAD SOME TINGLING IN LEFT ARM.  SHE STATES THAT SHE DID NOT GO TO EMERGENCY ROOM.  Thank you, [name] Judeth Horn [facility] [phone] Fax [phone]

## 2014-06-25 ENCOUNTER — Encounter: Payer: Self-pay | Admitting: Physician Assistant

## 2014-06-25 ENCOUNTER — Ambulatory Visit (INDEPENDENT_AMBULATORY_CARE_PROVIDER_SITE_OTHER): Payer: Medicare Other | Admitting: Physician Assistant

## 2014-06-25 VITALS — BP 138/72 | HR 80 | Temp 98.1°F | Resp 16 | Wt 177.0 lb

## 2014-06-25 DIAGNOSIS — Z79899 Other long term (current) drug therapy: Secondary | ICD-10-CM

## 2014-06-25 DIAGNOSIS — Z0000 Encounter for general adult medical examination without abnormal findings: Secondary | ICD-10-CM

## 2014-06-25 DIAGNOSIS — Z9181 History of falling: Secondary | ICD-10-CM

## 2014-06-25 DIAGNOSIS — E785 Hyperlipidemia, unspecified: Secondary | ICD-10-CM

## 2014-06-25 DIAGNOSIS — R7303 Prediabetes: Secondary | ICD-10-CM

## 2014-06-25 DIAGNOSIS — Z1331 Encounter for screening for depression: Secondary | ICD-10-CM

## 2014-06-25 DIAGNOSIS — E559 Vitamin D deficiency, unspecified: Secondary | ICD-10-CM

## 2014-06-25 DIAGNOSIS — L97521 Non-pressure chronic ulcer of other part of left foot limited to breakdown of skin: Secondary | ICD-10-CM

## 2014-06-25 DIAGNOSIS — I10 Essential (primary) hypertension: Secondary | ICD-10-CM

## 2014-06-25 MED ORDER — MUPIROCIN 2 % EX OINT
1.0000 | TOPICAL_OINTMENT | Freq: Two times a day (BID) | CUTANEOUS | Status: DC
Start: 2014-06-25 — End: 2014-08-01

## 2014-06-25 NOTE — Progress Notes (Signed)
MEDICARE ANNUAL WELLNESS VISIT AND FOLLOW UP  Assessment:   1. Essential hypertension  2. Prediabetes Discussed general issues about diabetes pathophysiology and management., Educational material distributed., Suggested low cholesterol diet., Encouraged aerobic exercise., Discussed foot care., Reminded to get yearly retinal exam.  3. Encounter for long-term (current) use of other medications  4. Hyperlipidemia  5. Vitamin D deficiency  6. Toe ulcer, left, limited to breakdown of skin Instructions given, if not better will follow up with Dr. March Rummage  7. High fall risk Discussed at home PT, patient does not want at this time.    Plan:   During the course of the visit the patient was educated and counseled about appropriate screening and preventive services including:    Pneumococcal vaccine   Influenza vaccine  Td vaccine  Screening electrocardiogram  Screening mammography  Bone densitometry screening  Colorectal cancer screening  Diabetes screening  Glaucoma screening  Nutrition counseling   Advanced directives: given info/requested  Screening recommendations, referrals:  Vaccinations: Tdap vaccine not indicated Influenza vaccine requested Pneumococcal vaccine not indicated Shingles vaccine not indicated Hep B vaccine not indicated  Nutrition assessed and recommended  Colonoscopy declined Mammogram requested Pap smear not indicated Pelvic exam not indicated Recommended yearly ophthalmology/optometry visit for glaucoma screening and checkup Recommended yearly dental visit for hygiene and checkup Advanced directives - requested  Conditions/risks identified: BMI: Discussed weight loss, diet, and increase physical activity.  Increase physical activity: AHA recommends 150 minutes of physical activity a week.  Medications reviewed DEXA- declined Diabetes is at goal, ACE/ARB therapy: Yes. Urinary Incontinence is an issue: discussed non pharmacology and  pharmacology options.  Fall risk: moderate- discussed PT, home fall assessment, medications.    Subjective:   Alyssa Schmitt is a 80 y.o. female who presents for Medicare Annual Wellness Visit and toe pain.  Date of last medicare wellness visit is unknown.   Her blood pressure has been controlled at home, today their BP is BP: 138/72 mmHg She does not workout. She denies chest pain, shortness of breath, dizziness.  Saw Dr. March Rummage June 25th, sees him every 10 weeks to get her nails clipped. 1 week ago she noticed some pain on her left medial foot.  Follows with Dr. Durward Fortes and has gotten shots in her right knee.   Names of Other Physician/Practitioners you currently use: 1. [facility]- here for primary care 2. unknown, dentist, last visit 2-3 years ago, has dentures Patient Care Team: Unk Pinto, MD as PCP - General (Internal Medicine) Thayer Headings, MD (Cardiology) Unk Pinto, MD as Attending Physician (Internal Medicine) Ardis Hughs, PA-C as Physician Assistant (Physician Assistant) Johna Sheriff, MD as Consulting Physician (Ophthalmology) - 06/2014 Mal Misty, MD as Consulting Physician (Vascular Surgery) Mayme Genta, MD as Consulting Physician (Gastroenterology)  Medication Review Current Outpatient Prescriptions on File Prior to Visit  Medication Sig Dispense Refill  . azithromycin (ZITHROMAX) 250 MG tablet 2 tablets by mouth today then one tablet daily for 4 days.  6 tablet  1  . busPIRone (BUSPAR) 10 MG tablet TAKE 1/2 - 1 TABLET 3 TIMES A DAY AS NEEDED FOR ANXIETY  270 tablet  1  . calcium-vitamin D (OSCAL WITH D) 250-125 MG-UNIT per tablet Take 1 tablet by mouth daily.      . Cholecalciferol (VITAMIN D3) 2000 UNITS TABS Take 1 tablet by mouth 2 (two) times daily.       . citalopram (CELEXA) 20 MG tablet Take 20  mg by mouth daily.      [name] Kitchen dexamethasone (DECADRON) 0.1 % ophthalmic solution Place 2 drops into  both eyes 3 (three) times daily.  5 mL  0  . EUFLEXXA 20 MG/2ML SOSY       . fexofenadine (ALLEGRA) 180 MG tablet Take 180 mg by mouth daily. 1/2 tab at night      . levothyroxine (SYNTHROID, LEVOTHROID) 50 MCG tablet Take 50 mcg by mouth daily.      . Magnesium 250 MG TABS Take by mouth.      . Multiple Vitamin (MULITIVITAMIN WITH MINERALS) TABS Take 1 tablet by mouth daily.      [name] Kitchen omeprazole (PRILOSEC) 20 MG capsule Take 40 mg by mouth daily.       . Probiotic Product (PROBIOTIC FORMULA PO) Take by mouth.      . TERAZOSIN HCL PO Take 10 mg by mouth at bedtime.       . traMADol (ULTRAM) 50 MG tablet TAKE 1/2 TO 1 TABLET BY MOUTH UP TO 4 TIMES DAILY AS NEEDED FOR PAIN  100 tablet  2   No current facility-administered medications on file prior to visit.    Current Problems (verified) Patient Active Problem List   Diagnosis Date Noted  . Encounter for long-term (current) use of other medications 03/20/2014  . Hypertension   . Hyperlipidemia   . Prediabetes   . Vitamin D deficiency   . Varicose veins of lower extremities with other complications 91/63/8466  . Ulcerated varicose veins of leg 04/13/2012  . Leg edema 03/25/2012    Screening Tests Health Maintenance  Topic Date Due  . Tetanus/tdap  10/08/1947  . Colonoscopy  10/17/2010  . Influenza Vaccine  07/07/2014  . Pneumococcal Polysaccharide Vaccine Age 56 And Over  Completed  . Zostavax  Completed     Immunization History  Administered Date(s) Administered  . Pneumococcal Polysaccharide-23 12/18/2007  . Zoster 07/25/2013    Preventative care: Last colonoscopy: 2001 will not get another Last mammogram: 06/2012 Last pap smear/pelvic exam: Remote with Dr. Philis Pique   DEXA: 2007 T -3.1 Intolerant of medications  Prior vaccinations: TD or Tdap: ALLERGY  Influenza: 08/2013 Pneumococcal: 2009 Shingles/Zostavax: 2014  History reviewed: allergies, current medications, past family history, past medical history, past social  history, past surgical history and problem list  Risk Factors: Osteoporosis: postmenopausal estrogen deficiency and dietary calcium and/or vitamin D deficiency History of fracture in the past year: no  Tobacco History  Substance Use Topics  . Smoking status: Never Smoker   . Smokeless tobacco: Never Used  . Alcohol Use: No   She does not smoke.  Patient is not a former smoker. Are there smokers in your home (other than you)?  No  Alcohol Current alcohol use: none  Caffeine Current caffeine use: coffee 1 /day  Exercise Current exercise: none and but can still walk and be active around house  Nutrition/Diet Current diet: in general, a "healthy" diet    Cardiac risk factors: advanced age (older than 32 for men, 34 for women), dyslipidemia, hypertension, obesity (BMI >= 30 kg/m2) and sedentary lifestyle.  Depression Screen (Note: if answer to either of the following is "Yes", a more complete depression screening is indicated)   Q1: Over the past two weeks, have you felt down, depressed or hopeless? No  Q2: Over the past two weeks, have you felt little interest or pleasure in doing things? No  Have you lost interest or pleasure in daily life? No  Do you often feel hopeless? No  Do you cry easily over simple problems? No  Activities of Daily Living In your present state of health, do you have any difficulty performing the following activities?:  Driving? Yes Managing money?  No Feeding yourself? No Getting from bed to chair? Yes Climbing a flight of stairs? Yes Preparing food and eating?: No Bathing or showering? No Getting dressed: No Getting to the toilet? No Using the toilet:No Moving around from place to place: Yes In the past year have you fallen or had a near fall?:No   Are you sexually active?  No  Do you have more than one partner?  No  Vision Difficulties: No  Hearing Difficulties: No Do you often ask people to speak up or repeat themselves? No Do you  experience ringing or noises in your ears? No Do you have difficulty understanding soft or whispered voices? No  Cognition  Do you feel that you have a problem with memory?No  Do you often misplace items? No  Do you feel safe at home?  Yes  Advanced directives Does patient have a [place]? Yes Does patient have a Living Will? Yes   Objective:   Blood pressure 138/72, pulse 80, temperature 98.1 F (36.7 C), resp. rate 16, weight 177 lb (80.287 kg). Body mass index is 33.46 kg/(m^2).  General appearance: alert, no distress, WD/WN,  female Cognitive Testing  Alert? Yes  Normal Appearance?Yes  Oriented to person? Yes  Place? Yes   Time? Yes  Recall of three objects?  No  Can perform simple calculations? Yes  Displays appropriate judgment?Yes  Can read the correct time from a watch face?Yes  General Appearance: Well nourished, in no apparent distress.  Eyes: PERRLA, EOMs, conjunctiva no swelling or erythema, normal fundi and vessels.  Sinuses: No Frontal/maxillary tenderness  ENT/Mouth: Ext aud canals clear, with TMs without erythema, bulging.No erythema, swelling, or exudate on post pharynx. Tonsils not swollen or erythematous. Hearing normal.  Neck: Supple, thyroid normal.  Respiratory: Respiratory effort normal, BS equal bilaterally without rales, rhonci, wheezing or stridor.  Cardio: Heart sounds normal, regular rate and rhythm with 4/6 systolic murmur but no rubs or gallops. Peripheral pulses brisk and equal bilaterally, without edema.  Abdomen: Flat, soft, with bowel sounds. Nontender, no guarding, rebound, hernias, masses, or organomegaly.  Lymphatics: Non tender without lymphadenopathy.  Musculoskeletal: Full ROM all peripheral extremities, joint stability, 4/5 strength, and gait with cane Skin: left  2nd medial toe with very mild erythema, scaling, no warmth, tenderness. Warm, dry without rashes, lesions, ecchymosis.  Neuro: Cranial nerves intact,  reflexes equal bilaterally. Normal muscle tone, no cerebellar symptoms. Sensation intact.  Pysch: Awake and oriented X 3, normal affect, Insight and Judgment appropriate.  Breast: defer Gyn: defer Rectal: defer  Medicare Attestation I have personally reviewed: The patient's medical and social history Their use of alcohol, tobacco or illicit drugs Their current medications and supplements The patient's functional ability including ADLs,fall risks, home safety risks, cognitive, and hearing and visual impairment Diet and physical activities Evidence for depression or mood disorders  The patient's weight, height, BMI, and visual acuity have been recorded in the chart.  I have made referrals, counseling, and provided education to the patient based on review of the above and I have provided the patient with a written personalized care plan for preventive services.     Vicie Mutters, PA-C   06/25/2014

## 2014-06-25 NOTE — Patient Instructions (Signed)
Skin Ulcer  A skin ulcer is an open sore that can be shallow or deep. Skin ulcers sometimes become infected and are difficult to treat. It may be 1 month or longer before real healing progress is made.  CAUSES    Injury.   Problems with the veins or arteries.   Diabetes.   Insect bites.   Bedsores.   Inflammatory conditions.  SYMPTOMS    Pain, redness, swelling, and tenderness around the ulcer.   Fever.   Bleeding from the ulcer.   Yellow or clear fluid coming from the ulcer.  DIAGNOSIS   There are many types of skin ulcers. Any open sores will be examined. Certain tests will be done to determine the kind of ulcer you have. The right treatment depends on the type of ulcer you have.  TREATMENT   Treatment is a long-term challenge. It may include:   Wearing an elastic wrap, compression stockings, or gel cast over the ulcer area.   Taking antibiotic medicines or putting antibiotic creams on the affected area if there is an infection.  HOME CARE INSTRUCTIONS   Put on your bandages (dressings), wraps, or casts over the ulcer as directed by your caregiver.   Change all dressings as directed by your caregiver.   Take all medicines as directed by your caregiver.   Keep the affected area clean and dry.   Avoid injuries to the affected area.   Eat a well-balanced, healthy diet that includes plenty of fruit and vegetables.   If you smoke, consider quitting or decreasing the amount of cigarettes you smoke.   Once the ulcer heals, get regular exercise as directed by your caregiver.   Work with your caregiver to make sure your blood pressure, cholesterol, and diabetes are well-controlled.   Keep your skin moisturized. Dry skin can crack and lead to skin ulcers.  SEEK IMMEDIATE MEDICAL CARE IF:    Your pain gets worse.   You have swelling, redness, or fluids around the ulcer.   You have chills.   You have a fever.  MAKE SURE YOU:    Understand these instructions.   Will watch your condition.   Will get  help right away if you are not doing well or get worse.  Document Released: 12/31/2004 Document Revised: 02/15/2012 Document Reviewed: 07/10/2011  [employer] Patient Information 2015 [employer]. This information is not intended to replace advice given to you by your health care provider. Make sure you discuss any questions you have with your health care provider.

## 2014-07-27 ENCOUNTER — Other Ambulatory Visit: Payer: Self-pay | Admitting: Internal Medicine

## 2014-08-01 ENCOUNTER — Encounter: Payer: Self-pay | Admitting: Internal Medicine

## 2014-08-01 ENCOUNTER — Ambulatory Visit (INDEPENDENT_AMBULATORY_CARE_PROVIDER_SITE_OTHER): Payer: Medicare Other | Admitting: Internal Medicine

## 2014-08-01 VITALS — BP 148/82 | HR 76 | Temp 97.9°F | Resp 18 | Ht 61.25 in | Wt 179.6 lb

## 2014-08-01 DIAGNOSIS — R7309 Other abnormal glucose: Secondary | ICD-10-CM

## 2014-08-01 DIAGNOSIS — Z1331 Encounter for screening for depression: Secondary | ICD-10-CM

## 2014-08-01 DIAGNOSIS — R7303 Prediabetes: Secondary | ICD-10-CM

## 2014-08-01 DIAGNOSIS — Z0000 Encounter for general adult medical examination without abnormal findings: Secondary | ICD-10-CM

## 2014-08-01 DIAGNOSIS — E559 Vitamin D deficiency, unspecified: Secondary | ICD-10-CM

## 2014-08-01 DIAGNOSIS — E785 Hyperlipidemia, unspecified: Secondary | ICD-10-CM

## 2014-08-01 DIAGNOSIS — Z789 Other specified health status: Secondary | ICD-10-CM

## 2014-08-01 DIAGNOSIS — Z79899 Other long term (current) drug therapy: Secondary | ICD-10-CM

## 2014-08-01 DIAGNOSIS — I10 Essential (primary) hypertension: Secondary | ICD-10-CM

## 2014-08-01 DIAGNOSIS — Z1212 Encounter for screening for malignant neoplasm of rectum: Secondary | ICD-10-CM

## 2014-08-01 HISTORY — DX: Morbid (severe) obesity due to excess calories: E66.01

## 2014-08-01 LAB — CBC WITH DIFFERENTIAL/PLATELET
Basophils Absolute: 0 10*3/uL (ref 0.0–0.1)
Basophils Relative: 0 % (ref 0–1)
Eosinophils Absolute: 0 10*3/uL (ref 0.0–0.7)
Eosinophils Relative: 1 % (ref 0–5)
HCT: 38.8 % (ref 36.0–46.0)
Hemoglobin: 13.2 g/dL (ref 12.0–15.0)
Lymphocytes Relative: 36 % (ref 12–46)
Lymphs Abs: 1.4 10*3/uL (ref 0.7–4.0)
MCH: 29.9 pg (ref 26.0–34.0)
MCHC: 34 g/dL (ref 30.0–36.0)
MCV: 87.8 fL (ref 78.0–100.0)
Monocytes Absolute: 0.5 10*3/uL (ref 0.1–1.0)
Monocytes Relative: 14 % — ABNORMAL HIGH (ref 3–12)
Neutro Abs: 1.9 10*3/uL (ref 1.7–7.7)
Neutrophils Relative %: 49 % (ref 43–77)
Platelets: 178 10*3/uL (ref 150–400)
RBC: 4.42 MIL/uL (ref 3.87–5.11)
RDW: 14.1 % (ref 11.5–15.5)
WBC: 3.9 10*3/uL — ABNORMAL LOW (ref 4.0–10.5)

## 2014-08-01 LAB — HEMOGLOBIN A1C
Hgb A1c MFr Bld: 5.5 % (ref <5.7)
Mean Plasma Glucose: 111 mg/dL (ref <117)

## 2014-08-01 NOTE — Patient Instructions (Signed)
Preventive Care for Adults A healthy lifestyle and preventive care can promote health and wellness. Preventive health guidelines for women include the following key practices.  A routine yearly physical is a good way to check with your health care provider about your health and preventive screening. It is a chance to share any concerns and updates on your health and to receive a thorough exam.  Visit your dentist for a routine exam and preventive care every 6 months. Brush your teeth twice a day and floss once a day. Good oral hygiene prevents tooth decay and gum disease.  The frequency of eye exams is based on your age, health, family medical history, use of contact lenses, and other factors. Follow your health care provider's recommendations for frequency of eye exams.  Eat a healthy diet. Foods like vegetables, fruits, whole grains, low-fat dairy products, and lean protein foods contain the nutrients you need without too many calories. Decrease your intake of foods high in solid fats, added sugars, and salt. Eat the right amount of calories for you.Get information about a proper diet from your health care provider, if necessary.  Regular physical exercise is one of the most important things you can do for your health. Most adults should get at least 150 minutes of moderate-intensity exercise (any activity that increases your heart rate and causes you to sweat) each week. In addition, most adults need muscle-strengthening exercises on 2 or more days a week.  Maintain a healthy weight. The body mass index (BMI) is a screening tool to identify possible weight problems. It provides an estimate of body fat based on height and weight. Your health care provider can find your BMI and can help you achieve or maintain a healthy weight.For adults 20 years and older:  A BMI below 18.5 is considered underweight.  A BMI of 18.5 to 24.9 is normal.  A BMI of 25 to 29.9 is considered overweight.  A BMI of  30 and above is considered obese.  Maintain normal blood lipids and cholesterol levels by exercising and minimizing your intake of saturated fat. Eat a balanced diet with plenty of fruit and vegetables. Blood tests for lipids and cholesterol should begin at age 76 and be repeated every 5 years. If your lipid or cholesterol levels are high, you are over 50, or you are at high risk for heart disease, you may need your cholesterol levels checked more frequently.Ongoing high lipid and cholesterol levels should be treated with medicines if diet and exercise are not working.  If you smoke, find out from your health care provider how to quit. If you do not use tobacco, do not start.  Lung cancer screening is recommended for adults aged 22-80 years who are at high risk for developing lung cancer because of a history of smoking. A yearly low-dose CT scan of the lungs is recommended for people who have at least a 30-pack-year history of smoking and are a current smoker or have quit within the past 15 years. A pack year of smoking is smoking an average of 1 pack of cigarettes a day for 1 year (for example: 1 pack a day for 30 years or 2 packs a day for 15 years). Yearly screening should continue until the smoker has stopped smoking for at least 15 years. Yearly screening should be stopped for people who develop a health problem that would prevent them from having lung cancer treatment.  If you are pregnant, do not drink alcohol. If you are breastfeeding,  be very cautious about drinking alcohol. If you are not pregnant and choose to drink alcohol, do not have more than 1 drink per day. One drink is considered to be 12 ounces (355 mL) of beer, 5 ounces (148 mL) of wine, or 1.5 ounces (44 mL) of liquor.  Avoid use of street drugs. Do not share needles with anyone. Ask for help if you need support or instructions about stopping the use of drugs.  High blood pressure causes heart disease and increases the risk of  stroke. Your blood pressure should be checked at least every 1 to 2 years. Ongoing high blood pressure should be treated with medicines if weight loss and exercise do not work.  If you are 75-52 years old, ask your health care provider if you should take aspirin to prevent strokes.  Diabetes screening involves taking a blood sample to check your fasting blood sugar level. This should be done once every 3 years, after age 15, if you are within normal weight and without risk factors for diabetes. Testing should be considered at a younger age or be carried out more frequently if you are overweight and have at least 1 risk factor for diabetes.  Breast cancer screening is essential preventive care for women. You should practice "breast self-awareness." This means understanding the normal appearance and feel of your breasts and may include breast self-examination. Any changes detected, no matter how small, should be reported to a health care provider. Women in their 58s and 30s should have a clinical breast exam (CBE) by a health care provider as part of a regular health exam every 1 to 3 years. After age 16, women should have a CBE every year. Starting at age 53, women should consider having a mammogram (breast X-ray test) every year. Women who have a family history of breast cancer should talk to their health care provider about genetic screening. Women at a high risk of breast cancer should talk to their health care providers about having an MRI and a mammogram every year.  Breast cancer gene (BRCA)-related cancer risk assessment is recommended for women who have family members with BRCA-related cancers. BRCA-related cancers include breast, ovarian, tubal, and peritoneal cancers. Having family members with these cancers may be associated with an increased risk for harmful changes (mutations) in the breast cancer genes BRCA1 and BRCA2. Results of the assessment will determine the need for genetic counseling and  BRCA1 and BRCA2 testing.  Routine pelvic exams to screen for cancer are no longer recommended for nonpregnant women who are considered low risk for cancer of the pelvic organs (ovaries, uterus, and vagina) and who do not have symptoms. Ask your health care provider if a screening pelvic exam is right for you.  If you have had past treatment for cervical cancer or a condition that could lead to cancer, you need Pap tests and screening for cancer for at least 20 years after your treatment. If Pap tests have been discontinued, your risk factors (such as having a new sexual partner) need to be reassessed to determine if screening should be resumed. Some women have medical problems that increase the chance of getting cervical cancer. In these cases, your health care provider may recommend more frequent screening and Pap tests.  The HPV test is an additional test that may be used for cervical cancer screening. The HPV test looks for the virus that can cause the cell changes on the cervix. The cells collected during the Pap test can be  tested for HPV. The HPV test could be used to screen women aged 30 years and older, and should be used in women of any age who have unclear Pap test results. After the age of 30, women should have HPV testing at the same frequency as a Pap test.  Colorectal cancer can be detected and often prevented. Most routine colorectal cancer screening begins at the age of 50 years and continues through age 75 years. However, your health care provider may recommend screening at an earlier age if you have risk factors for colon cancer. On a yearly basis, your health care provider may provide home test kits to check for hidden blood in the stool. Use of a small camera at the end of a tube, to directly examine the colon (sigmoidoscopy or colonoscopy), can detect the earliest forms of colorectal cancer. Talk to your health care provider about this at age 50, when routine screening begins. Direct  exam of the colon should be repeated every 5-10 years through age 75 years, unless early forms of pre-cancerous polyps or small growths are found.  People who are at an increased risk for hepatitis B should be screened for this virus. You are considered at high risk for hepatitis B if:  You were born in a country where hepatitis B occurs often. Talk with your health care provider about which countries are considered high risk.  Your parents were born in a high-risk country and you have not received a shot to protect against hepatitis B (hepatitis B vaccine).  You have HIV or AIDS.  You use needles to inject street drugs.  You live with, or have sex with, someone who has hepatitis B.  You get hemodialysis treatment.  You take certain medicines for conditions like cancer, organ transplantation, and autoimmune conditions.  Hepatitis C blood testing is recommended for all people born from 1945 through 1965 and any individual with known risks for hepatitis C.  Practice safe sex. Use condoms and avoid high-risk sexual practices to reduce the spread of sexually transmitted infections (STIs). STIs include gonorrhea, chlamydia, syphilis, trichomonas, herpes, HPV, and human immunodeficiency virus (HIV). Herpes, HIV, and HPV are viral illnesses that have no cure. They can result in disability, cancer, and death.  You should be screened for sexually transmitted illnesses (STIs) including gonorrhea and chlamydia if:  You are sexually active and are younger than 24 years.  You are older than 24 years and your health care provider tells you that you are at risk for this type of infection.  Your sexual activity has changed since you were last screened and you are at an increased risk for chlamydia or gonorrhea. Ask your health care provider if you are at risk.  If you are at risk of being infected with HIV, it is recommended that you take a prescription medicine daily to prevent HIV infection. This is  called preexposure prophylaxis (PrEP). You are considered at risk if:  You are a heterosexual woman, are sexually active, and are at increased risk for HIV infection.  You take drugs by injection.  You are sexually active with a partner who has HIV.  Talk with your health care provider about whether you are at high risk of being infected with HIV. If you choose to begin PrEP, you should first be tested for HIV. You should then be tested every 3 months for as long as you are taking PrEP.  Osteoporosis is a disease in which the bones lose minerals and strength   with aging. This can result in serious bone fractures or breaks. The risk of osteoporosis can be identified using a bone density scan. Women ages 65 years and over and women at risk for fractures or osteoporosis should discuss screening with their health care providers. Ask your health care provider whether you should take a calcium supplement or vitamin D to reduce the rate of osteoporosis.  Menopause can be associated with physical symptoms and risks. Hormone replacement therapy is available to decrease symptoms and risks. You should talk to your health care provider about whether hormone replacement therapy is right for you.  Use sunscreen. Apply sunscreen liberally and repeatedly throughout the day. You should seek shade when your shadow is shorter than you. Protect yourself by wearing long sleeves, pants, a wide-brimmed hat, and sunglasses year round, whenever you are outdoors.  Once a month, do a whole body skin exam, using a mirror to look at the skin on your back. Tell your health care provider of new moles, moles that have irregular borders, moles that are larger than a pencil eraser, or moles that have changed in shape or color.  Stay current with required vaccines (immunizations).  Influenza vaccine. All adults should be immunized every year.  Tetanus, diphtheria, and acellular pertussis (Td, Tdap) vaccine. Pregnant women should  receive 1 dose of Tdap vaccine during each pregnancy. The dose should be obtained regardless of the length of time since the last dose. Immunization is preferred during the 27th-36th week of gestation. An adult who has not previously received Tdap or who does not know her vaccine status should receive 1 dose of Tdap. This initial dose should be followed by tetanus and diphtheria toxoids (Td) booster doses every 10 years. Adults with an unknown or incomplete history of completing a 3-dose immunization series with Td-containing vaccines should begin or complete a primary immunization series including a Tdap dose. Adults should receive a Td booster every 10 years.  Varicella vaccine. An adult without evidence of immunity to varicella should receive 2 doses or a second dose if she has previously received 1 dose. Pregnant females who do not have evidence of immunity should receive the first dose after pregnancy. This first dose should be obtained before leaving the health care facility. The second dose should be obtained 4-8 weeks after the first dose.  Human papillomavirus (HPV) vaccine. Females aged 13-26 years who have not received the vaccine previously should obtain the 3-dose series. The vaccine is not recommended for use in pregnant females. However, pregnancy testing is not needed before receiving a dose. If a female is found to be pregnant after receiving a dose, no treatment is needed. In that case, the remaining doses should be delayed until after the pregnancy. Immunization is recommended for any person with an immunocompromised condition through the age of 26 years if she did not get any or all doses earlier. During the 3-dose series, the second dose should be obtained 4-8 weeks after the first dose. The third dose should be obtained 24 weeks after the first dose and 16 weeks after the second dose.  Zoster vaccine. One dose is recommended for adults aged 60 years or older unless certain conditions are  present.  Measles, mumps, and rubella (MMR) vaccine. Adults born before 1957 generally are considered immune to measles and mumps. Adults born in 1957 or later should have 1 or more doses of MMR vaccine unless there is a contraindication to the vaccine or there is laboratory evidence of immunity to   each of the three diseases. A routine second dose of MMR vaccine should be obtained at least 28 days after the first dose for students attending postsecondary schools, health care workers, or international travelers. People who received inactivated measles vaccine or an unknown type of measles vaccine during 1963-1967 should receive 2 doses of MMR vaccine. People who received inactivated mumps vaccine or an unknown type of mumps vaccine before 1979 and are at high risk for mumps infection should consider immunization with 2 doses of MMR vaccine. For females of childbearing age, rubella immunity should be determined. If there is no evidence of immunity, females who are not pregnant should be vaccinated. If there is no evidence of immunity, females who are pregnant should delay immunization until after pregnancy. Unvaccinated health care workers born before 1957 who lack laboratory evidence of measles, mumps, or rubella immunity or laboratory confirmation of disease should consider measles and mumps immunization with 2 doses of MMR vaccine or rubella immunization with 1 dose of MMR vaccine.  Pneumococcal 13-valent conjugate (PCV13) vaccine. When indicated, a person who is uncertain of her immunization history and has no record of immunization should receive the PCV13 vaccine. An adult aged 19 years or older who has certain medical conditions and has not been previously immunized should receive 1 dose of PCV13 vaccine. This PCV13 should be followed with a dose of pneumococcal polysaccharide (PPSV23) vaccine. The PPSV23 vaccine dose should be obtained at least 8 weeks after the dose of PCV13 vaccine. An adult aged 19  years or older who has certain medical conditions and previously received 1 or more doses of PPSV23 vaccine should receive 1 dose of PCV13. The PCV13 vaccine dose should be obtained 1 or more years after the last PPSV23 vaccine dose.  Pneumococcal polysaccharide (PPSV23) vaccine. When PCV13 is also indicated, PCV13 should be obtained first. All adults aged 65 years and older should be immunized. An adult younger than age 65 years who has certain medical conditions should be immunized. Any person who resides in a nursing home or long-term care facility should be immunized. An adult smoker should be immunized. People with an immunocompromised condition and certain other conditions should receive both PCV13 and PPSV23 vaccines. People with human immunodeficiency virus (HIV) infection should be immunized as soon as possible after diagnosis. Immunization during chemotherapy or radiation therapy should be avoided. Routine use of PPSV23 vaccine is not recommended for American Indians, [place] Natives, or people younger than 65 years unless there are medical conditions that require PPSV23 vaccine. When indicated, people who have unknown immunization and have no record of immunization should receive PPSV23 vaccine. One-time revaccination 5 years after the first dose of PPSV23 is recommended for people aged 19-64 years who have chronic kidney failure, nephrotic syndrome, asplenia, or immunocompromised conditions. People who received 1-2 doses of PPSV23 before age 65 years should receive another dose of PPSV23 vaccine at age 65 years or later if at least 5 years have passed since the previous dose. Doses of PPSV23 are not needed for people immunized with PPSV23 at or after age 65 years.  Meningococcal vaccine. Adults with asplenia or persistent complement component deficiencies should receive 2 doses of quadrivalent meningococcal conjugate (MenACWY-D) vaccine. The doses should be obtained at least 2 months apart.  Microbiologists working with certain meningococcal bacteria, military recruits, people at risk during an outbreak, and people who travel to or live in countries with a high rate of meningitis should be immunized. A first-year college student up through age   21 years who is living in a residence hall should receive a dose if she did not receive a dose on or after her 16th birthday. Adults who have certain high-risk conditions should receive one or more doses of vaccine.  Hepatitis A vaccine. Adults who wish to be protected from this disease, have certain high-risk conditions, work with hepatitis A-infected animals, work in hepatitis A research labs, or travel to or work in countries with a high rate of hepatitis A should be immunized. Adults who were previously unvaccinated and who anticipate close contact with an international adoptee during the first 60 days after arrival in the [place] from a country with a high rate of hepatitis A should be immunized.  Hepatitis B vaccine. Adults who wish to be protected from this disease, have certain high-risk conditions, may be exposed to blood or other infectious body fluids, are household contacts or sex partners of hepatitis B positive people, are clients or workers in certain care facilities, or travel to or work in countries with a high rate of hepatitis B should be immunized.  Haemophilus influenzae type b (Hib) vaccine. A previously unvaccinated person with asplenia or sickle cell disease or having a scheduled splenectomy should receive 1 dose of Hib vaccine. Regardless of previous immunization, a recipient of a hematopoietic stem cell transplant should receive a 3-dose series 6-12 months after her successful transplant. Hib vaccine is not recommended for adults with HIV infection.   Preventive Services / Frequency  Ages 77 years and over  Blood pressure check.** / Every 1 to 2 years.  Lipid and cholesterol check.** / Every 5 years beginning at age  9 years.  Lung cancer screening. / Every year if you are aged 40-80 years and have a 30-pack-year history of smoking and currently smoke or have quit within the past 15 years. Yearly screening is stopped once you have quit smoking for at least 15 years or develop a health problem that would prevent you from having lung cancer treatment.  Clinical breast exam.** / Every year after age 59 years.  BRCA-related cancer risk assessment.** / For women who have family members with a BRCA-related cancer (breast, ovarian, tubal, or peritoneal cancers).  Mammogram.** / Every year beginning at age 34 years and continuing for as long as you are in good health. Consult with your health care provider.  Pap test.** / Every 3 years starting at age 80 years through age 14 or 66 years with 3 consecutive normal Pap tests. Testing can be stopped between 65 and 70 years with 3 consecutive normal Pap tests and no abnormal Pap or HPV tests in the past 10 years.  HPV screening.** / Every 3 years from ages 65 years through ages 62 or 13 years with a history of 3 consecutive normal Pap tests. Testing can be stopped between 65 and 70 years with 3 consecutive normal Pap tests and no abnormal Pap or HPV tests in the past 10 years.  Fecal occult blood test (FOBT) of stool. / Every year beginning at age 43 years and continuing until age 87 years. You may not need to do this test if you get a colonoscopy every 10 years.  Flexible sigmoidoscopy or colonoscopy.** / Every 5 years for a flexible sigmoidoscopy or every 10 years for a colonoscopy beginning at age 50 years and continuing until age 74 years.  Hepatitis C blood test.** / For all people born from [birth year] through 1965 and any individual with known risks for hepatitis C.  Osteoporosis screening.** / A one-time screening for women ages 44 years and over and women at risk for fractures or osteoporosis.  Skin self-exam. / Monthly.  Influenza vaccine. / Every  year.  Tetanus, diphtheria, and acellular pertussis (Tdap/Td) vaccine.** / 1 dose of Td every 10 years.  Varicella vaccine.** / Consult your health care provider.  Zoster vaccine.** / 1 dose for adults aged 71 years or older.  Pneumococcal 13-valent conjugate (PCV13) vaccine.** / Consult your health care provider.  Pneumococcal polysaccharide (PPSV23) vaccine.** / 1 dose for all adults aged 25 years and older.  Meningococcal vaccine.** / Consult your health care provider.  Hepatitis A vaccine.** / Consult your health care provider.  Hepatitis B vaccine.** / Consult your health care provider.  Haemophilus influenzae type b (Hib) vaccine.** / Consult your health care provider.

## 2014-08-01 NOTE — Progress Notes (Signed)
Patient ID: Alyssa Schmitt, female   DOB: 1928/11/13, 80 y.o.   MRN: [id number]   Annual Screening Comprehensive Examination  This very nice 81 y.o.WWF presents for complete physical.  Patient has been followed for HTN, Prediabetes, Hyperlipidemia, and Vitamin D Deficiency.    HTN predates since 85. Patient's BP has been controlled at home and patient denies any cardiac symptoms as chest pain, palpitations, shortness of breath, dizziness or ankle swelling. Today's BP is148/82 mmHg.   Patient's hyperlipidemia is controlled with diet and medications. Patient denies myalgias or other medication SE's. Last lipids were  At goal - Chol 147; HDL  48; LDL 83; Trig 80 on  03/20/2014.   Patient has Morbid Obesity (33.7)  With consequent prediabetes predating since Feb 2014 with an A1c 5.7%  and patient denies reactive hypoglycemic symptoms, visual blurring, diabetic polys, or paresthesias. Last A1c was  5.6% on  03/20/2014.   Finally, patient has history of Vitamin D Deficiency  and last Vitamin D was 71 in Aug 2014.  Medication Sig  . busPIRone (BUSPAR) 10 MG tablet TAKE 1/2 - 1 TABLET 3 TIMES A DAY AS NEEDED FOR ANXIETY  . OSCAL WITH D 250-125  Take 1 tablet by mouth daily.  [name] Kitchen VITAMIN D 2000 UNITS  Take 1 tablet by mouth 2 (two) times daily.   . citalopram  20 MG tablet Take 20 mg by mouth daily.  [name] Kitchen dexamethasone  0.1 % ophth soln Place 2 drops into both eyes 3 (three) times daily.  . EUFLEXXA 20 MG/2ML SOSY   . fexofenadine  180 MG tablet Take 180 mg by mouth daily. 1/2 tab at night  . levothyroxine  50 MCG tablet Take 50 mcg by mouth daily.  . Magnesium 250 MG TABS Take by mouth.  [name] WITH MINERALS Take 1 tablet by mouth daily.  . mupirocin  (BACTROBAN) 2 % Apply 1 application topically 2 (two) times daily.  [name] Kitchen omeprazole  20 MG capsule Take 40 mg by mouth daily.   [name] Kitchen PROBIOTIC FORMULA Take by mouth.  . terazosin  10 MG capsule TAKE ONE CAPSULE BY MOUTH EVERY DAY  . traMADol  50 MG  tablet TAKE 1/2 TO 1 TABLET BY MOUTH UP TO 4 TIMES DAILY     Allergies  Allergen Reactions  . Aspirin   . Celebrex [Celecoxib]   . Fosamax [Alendronate Sodium]   . Keflex [Cephalexin]   . Penicillins   . Prednisone   . Prevacid [Lansoprazole]   . Sulfa Antibiotics   . Tetanus Toxoids   . Tetracyclines & Related    Past Medical History  Diagnosis Date  . Abnormal EKG     LBBB  . H/O echocardiogram   . Ulcer   . Allergy   . Arthritis   . Hypertension   . Thyroid disease   . Hyperlipidemia   . GERD (gastroesophageal reflux disease)   . Anxiety   . Depression   . COPD (chronic obstructive pulmonary disease)   . Osteoporosis   . Prediabetes   . Venous insufficiency   . Vitamin D deficiency    Past Surgical History  Procedure Laterality Date  . Tonsillectomy      childhood  . Vesicovaginal fistula closure w/ tah     Family History  Problem Relation Age of Onset  . Heart disease Mother   . Other Mother     VARICOSE VEINS  . Diabetes Father   . Other Brother     VARICOSE  VEINS   History  Substance Use Topics  . Smoking status: Never Smoker   . Smokeless tobacco: Never Used  . Alcohol Use: No    ROS Constitutional: Denies fever, chills, weight loss/gain, headaches, insomnia, fatigue, night sweats, and change in appetite. Eyes: Denies redness, blurred vision, diplopia, discharge, itchy, watery eyes.  ENT: Denies discharge, congestion, post nasal drip, epistaxis, sore throat, earache, hearing loss, dental pain, Tinnitus, Vertigo, Sinus pain, snoring.  Cardio: Denies chest pain, palpitations, irregular heartbeat, syncope, dyspnea, diaphoresis, orthopnea, PND, claudication, edema Respiratory: denies cough, dyspnea, DOE, pleurisy, hoarseness, laryngitis, wheezing.  Gastrointestinal: Denies dysphagia, heartburn, reflux, water brash, pain, cramps, nausea, vomiting, bloating, diarrhea, constipation, hematemesis, melena, hematochezia, jaundice, hemorrhoids Genitourinary:  Denies dysuria, frequency, urgency, nocturia, hesitancy, discharge, hematuria, flank pain Breast: Breast lumps, nipple discharge, bleeding.  Musculoskeletal: Denies arthralgia, myalgia, stiffness, Jt. Swelling, pain, limp, and strain/sprain. Denies falls. Skin: Denies puritis, rash, hives, warts, acne, eczema, changing in skin lesion Neuro: No weakness, tremor, incoordination, spasms, paresthesia, pain Psychiatric: Denies confusion, memory loss, sensory loss. Denies Depression. Endocrine: Denies change in weight, skin, hair change, nocturia, and paresthesia, diabetic polys, visual blurring, hyper / hypo glycemic episodes.  Heme/Lymph: No excessive bleeding, bruising, enlarged lymph nodes.  Physical Exam  BP 148/82  Pulse 76  Temp(Src) 97.9 F (36.6 C) (Temporal)  Resp 18  Ht 5' 1.25" (1.556 m)  Wt 179 lb 9.6 oz (81.466 kg)  BMI 33.65 kg/m2  General Appearance: Well nourished and in no apparent distress. Eyes: PERRLA, EOMs, conjunctiva no swelling or erythema, normal fundi and vessels. Sinuses: No frontal/maxillary tenderness ENT/Mouth: EACs patent / TMs  nl. Nares clear without erythema, swelling, mucoid exudates. Oral hygiene is good. No erythema, swelling, or exudate. Tongue normal, non-obstructing. Tonsils not swollen or erythematous. Hearing normal.  Neck: Supple, thyroid normal. No bruits, nodes or JVD. Respiratory: Respiratory effort normal.  BS equal and clear bilateral without rales, rhonci, wheezing or stridor. Cardio: Heart sounds are normal with regular rate and rhythm and no murmurs, rubs or gallops. Peripheral pulses are normal and equal bilaterally without edema. No aortic or femoral bruits. Chest: symmetric with normal excursions and percussion. Breasts: Symmetric, without lumps, nipple discharge, retractions, or fibrocystic changes.  Abdomen: Flat, soft, with bowl sounds. Nontender, no guarding, rebound, hernias, masses, or organomegaly.  Lymphatics: Non tender without  lymphadenopathy.  Genitourinary:  Musculoskeletal: Full ROM all peripheral extremities, joint stability, 5/5 strength, and normal gait. Skin: Warm and dry without rashes, lesions, cyanosis, clubbing or  ecchymosis.  Neuro: Cranial nerves intact, reflexes equal bilaterally. Normal muscle tone, no cerebellar symptoms. Sensation intact.  Pysch: Awake and oriented X 3, normal affect, Insight and Judgment appropriate.   Assessment and Plan  1. Annual Screening Examination 2. Hypertension  3. Hyperlipidemia 4. Pre Diabetes 5. Vitamin D Deficiency 6 . Morbid Obesity  Continue prudent diet as discussed, weight control, BP monitoring, regular exercise, and medications. Discussed med's effects and SE's. Screening labs and tests as requested with regular follow-up as recommended.

## 2014-08-02 LAB — BASIC METABOLIC PANEL WITH GFR
BUN: 13 mg/dL (ref 6–23)
CO2: 26 mEq/L (ref 19–32)
Calcium: 9.6 mg/dL (ref 8.4–10.5)
Chloride: 100 mEq/L (ref 96–112)
Creat: 0.93 mg/dL (ref 0.50–1.10)
GFR, Est African American: 66 mL/min
GFR, Est Non African American: 57 mL/min — ABNORMAL LOW
Glucose, Bld: 81 mg/dL (ref 70–99)
Potassium: 4.3 mEq/L (ref 3.5–5.3)
Sodium: 139 mEq/L (ref 135–145)

## 2014-08-02 LAB — INSULIN, FASTING: Insulin fasting, serum: 9.7 u[IU]/mL (ref 2.0–19.6)

## 2014-08-02 LAB — HEPATIC FUNCTION PANEL
ALT: 24 U/L (ref 0–35)
AST: 40 U/L — ABNORMAL HIGH (ref 0–37)
Albumin: 4.4 g/dL (ref 3.5–5.2)
Alkaline Phosphatase: 73 U/L (ref 39–117)
Bilirubin, Direct: 0.2 mg/dL (ref 0.0–0.3)
Indirect Bilirubin: 0.8 mg/dL (ref 0.2–1.2)
Total Bilirubin: 1 mg/dL (ref 0.2–1.2)
Total Protein: 7.9 g/dL (ref 6.0–8.3)

## 2014-08-02 LAB — LIPID PANEL
Cholesterol: 151 mg/dL (ref 0–200)
HDL: 50 mg/dL (ref >39)
LDL Cholesterol: 80 mg/dL (ref 0–99)
Total CHOL/HDL Ratio: 3 Ratio
Triglycerides: 103 mg/dL (ref <150)
VLDL: 21 mg/dL (ref 0–40)

## 2014-08-02 LAB — URINALYSIS, MICROSCOPIC ONLY
Casts: NONE SEEN
WBC, UA: >50 WBC/hpf — AB (ref <3)

## 2014-08-02 LAB — VITAMIN D 25 HYDROXY (VIT D DEFICIENCY, FRACTURES): Vit D, 25-Hydroxy: 85 ng/mL (ref 30–89)

## 2014-08-02 LAB — MICROALBUMIN / CREATININE URINE RATIO
Creatinine, Urine: 167.3 mg/dL
Microalb Creat Ratio: 15 mg/g (ref 0.0–30.0)
Microalb, Ur: 2.51 mg/dL — ABNORMAL HIGH (ref 0.00–1.89)

## 2014-08-02 LAB — MAGNESIUM: Magnesium: 1.8 mg/dL (ref 1.5–2.5)

## 2014-08-02 LAB — TSH: TSH: 4.146 u[IU]/mL (ref 0.350–4.500)

## 2014-08-07 ENCOUNTER — Other Ambulatory Visit: Payer: Self-pay | Admitting: Internal Medicine

## 2014-08-28 ENCOUNTER — Ambulatory Visit (INDEPENDENT_AMBULATORY_CARE_PROVIDER_SITE_OTHER): Payer: Medicare Other | Admitting: *Deleted

## 2014-08-28 ENCOUNTER — Other Ambulatory Visit: Payer: Self-pay | Admitting: *Deleted

## 2014-08-28 DIAGNOSIS — N3001 Acute cystitis with hematuria: Secondary | ICD-10-CM

## 2014-08-28 DIAGNOSIS — Z1212 Encounter for screening for malignant neoplasm of rectum: Secondary | ICD-10-CM

## 2014-08-28 DIAGNOSIS — N3000 Acute cystitis without hematuria: Secondary | ICD-10-CM

## 2014-08-28 LAB — POC HEMOCCULT BLD/STL (HOME/3-CARD/SCREEN)
Card #2 Fecal Occult Blod, POC: NEGATIVE
Card #3 Fecal Occult Blood, POC: NEGATIVE
Fecal Occult Blood, POC: NEGATIVE

## 2014-08-28 NOTE — Progress Notes (Signed)
Patient ID: Alyssa Schmitt, female   DOB: 1927-12-15, 80 y.o.   MRN: [id number] Patient presents for recheck UA, C&S per Dr. Idell Pickles orders.  Urine looked questionable for UTI at CPE, but unable to add C&S at the time labs were reviewed.  Patient denies any current UTI symptoms.

## 2014-08-29 LAB — URINALYSIS, MICROSCOPIC ONLY
Bacteria, UA: NONE SEEN
Casts: NONE SEEN
Crystals: NONE SEEN
Squamous Epithelial / LPF: NONE SEEN
WBC, UA: >50 WBC/hpf — AB (ref <3)

## 2014-08-29 LAB — URINALYSIS, ROUTINE W REFLEX MICROSCOPIC
Bilirubin Urine: NEGATIVE
Glucose, UA: NEGATIVE mg/dL
Hgb urine dipstick: NEGATIVE
Ketones, ur: NEGATIVE mg/dL
Nitrite: NEGATIVE
Protein, ur: NEGATIVE mg/dL
Specific Gravity, Urine: 1.016 (ref 1.005–1.030)
Urobilinogen, UA: 0.2 mg/dL (ref 0.0–1.0)
pH: 7.5 (ref 5.0–8.0)

## 2014-08-30 LAB — URINE CULTURE
Colony Count: NO GROWTH
Organism ID, Bacteria: NO GROWTH

## 2014-09-05 ENCOUNTER — Ambulatory Visit (INDEPENDENT_AMBULATORY_CARE_PROVIDER_SITE_OTHER): Payer: Medicare Other | Admitting: *Deleted

## 2014-09-05 DIAGNOSIS — Z23 Encounter for immunization: Secondary | ICD-10-CM

## 2014-09-13 ENCOUNTER — Other Ambulatory Visit: Payer: Self-pay | Admitting: Internal Medicine

## 2014-10-20 ENCOUNTER — Other Ambulatory Visit: Payer: Self-pay | Admitting: Emergency Medicine

## 2014-10-20 ENCOUNTER — Other Ambulatory Visit: Payer: Self-pay | Admitting: Physician Assistant

## 2014-10-24 ENCOUNTER — Other Ambulatory Visit: Payer: Self-pay

## 2014-10-24 MED ORDER — TERAZOSIN HCL 10 MG PO CAPS: 10.0000 mg | ORAL_CAPSULE | Freq: Every day | ORAL | Status: DC

## 2014-10-31 ENCOUNTER — Encounter: Payer: Self-pay | Admitting: Internal Medicine

## 2014-11-07 ENCOUNTER — Ambulatory Visit (INDEPENDENT_AMBULATORY_CARE_PROVIDER_SITE_OTHER): Payer: Medicare Other | Admitting: Physician Assistant

## 2014-11-07 ENCOUNTER — Encounter: Payer: Self-pay | Admitting: Physician Assistant

## 2014-11-07 VITALS — BP 138/80 | HR 100 | Temp 97.7°F | Resp 16 | Ht 61.25 in | Wt 181.0 lb

## 2014-11-07 DIAGNOSIS — E785 Hyperlipidemia, unspecified: Secondary | ICD-10-CM

## 2014-11-07 DIAGNOSIS — Z79899 Other long term (current) drug therapy: Secondary | ICD-10-CM

## 2014-11-07 DIAGNOSIS — R600 Localized edema: Secondary | ICD-10-CM

## 2014-11-07 DIAGNOSIS — I10 Essential (primary) hypertension: Secondary | ICD-10-CM

## 2014-11-07 DIAGNOSIS — R7309 Other abnormal glucose: Secondary | ICD-10-CM

## 2014-11-07 DIAGNOSIS — R7303 Prediabetes: Secondary | ICD-10-CM

## 2014-11-07 DIAGNOSIS — E559 Vitamin D deficiency, unspecified: Secondary | ICD-10-CM

## 2014-11-07 LAB — HEPATIC FUNCTION PANEL
ALT: 22 U/L (ref 0–35)
AST: 37 U/L (ref 0–37)
Albumin: 4 g/dL (ref 3.5–5.2)
Alkaline Phosphatase: 90 U/L (ref 39–117)
Bilirubin, Direct: 0.2 mg/dL (ref 0.0–0.3)
Indirect Bilirubin: 0.4 mg/dL (ref 0.2–1.2)
Total Bilirubin: 0.6 mg/dL (ref 0.2–1.2)
Total Protein: 7.7 g/dL (ref 6.0–8.3)

## 2014-11-07 LAB — BASIC METABOLIC PANEL WITH GFR
BUN: 16 mg/dL (ref 6–23)
CO2: 29 mEq/L (ref 19–32)
Calcium: 9.6 mg/dL (ref 8.4–10.5)
Chloride: 102 mEq/L (ref 96–112)
Creat: 0.85 mg/dL (ref 0.50–1.10)
GFR, Est African American: 73 mL/min
GFR, Est Non African American: 63 mL/min
Glucose, Bld: 74 mg/dL (ref 70–99)
Potassium: 4.4 mEq/L (ref 3.5–5.3)
Sodium: 138 mEq/L (ref 135–145)

## 2014-11-07 LAB — LIPID PANEL
Cholesterol: 142 mg/dL (ref 0–200)
HDL: 46 mg/dL (ref >39)
LDL Cholesterol: 70 mg/dL (ref 0–99)
Total CHOL/HDL Ratio: 3.1 Ratio
Triglycerides: 131 mg/dL (ref <150)
VLDL: 26 mg/dL (ref 0–40)

## 2014-11-07 LAB — MAGNESIUM: Magnesium: 1.9 mg/dL (ref 1.5–2.5)

## 2014-11-07 LAB — TSH: TSH: 2.796 u[IU]/mL (ref 0.350–4.500)

## 2014-11-07 MED ORDER — CITALOPRAM HYDROBROMIDE 20 MG PO TABS
ORAL_TABLET | ORAL | Status: DC
Start: 2014-11-07 — End: 2015-05-27

## 2014-11-07 NOTE — Patient Instructions (Signed)

## 2014-11-07 NOTE — Progress Notes (Signed)
Assessment and Plan:  Hypertension: Continue medication, monitor blood pressure at home. Continue DASH diet.  Reminder to go to the ER if any CP, SOB, nausea, dizziness, severe HA, changes vision/speech, left arm numbness and tingling, and jaw pain. Cholesterol: Continue diet and exercise. Check cholesterol.  Pre-diabetes-Continue diet and exercise. Check A1C Vitamin D Def- check level and continue medications.   Continue diet and meds as discussed. Further disposition pending results of labs.  HPI 80 y.o. female  presents for 3 month follow up with hypertension, hyperlipidemia, prediabetes and vitamin D. Her blood pressure has been controlled at home, today their BP is BP: 138/80 mmHg She does not workout. She denies chest pain, shortness of breath, dizziness.  She is not on cholesterol medication and denies myalgias. Her cholesterol is at goal. The cholesterol last visit was:   Lab Results  Component Value Date   CHOL 151 08/01/2014   HDL 50 08/01/2014   LDLCALC 80 08/01/2014   TRIG 103 08/01/2014   CHOLHDL 3.0 08/01/2014   She has been working on diet and exercise for prediabetes, and denies nausea, paresthesia of the feet, polydipsia and polyuria. Last A1C in the office was:  Lab Results  Component Value Date   HGBA1C 5.5 08/01/2014   Patient is on Vitamin D supplement.   Lab Results  Component Value Date   VD25OH 80 08/01/2014     She has chronic edema, follows with podiatry.  She walks with a cane, takes tramadol for pain which helps.    Current Medications:  Current Outpatient Prescriptions on File Prior to Visit  Medication Sig Dispense Refill  . busPIRone (BUSPAR) 10 MG tablet TAKE 1/2 - 1 TABLET 3 TIMES A DAY AS NEEDED FOR ANXIETY 270 tablet 1  . calcium-vitamin D (OSCAL WITH D) 250-125 MG-UNIT per tablet Take 1 tablet by mouth daily.    . Cholecalciferol (VITAMIN D3) 2000 UNITS TABS Take 1 tablet by mouth 2 (two) times daily.     . citalopram (CELEXA) 20 MG tablet  Take 20 mg by mouth daily.    . EUFLEXXA 20 MG/2ML SOSY     . fexofenadine (ALLEGRA) 180 MG tablet Take 180 mg by mouth daily. 1/2 tab at night    . levothyroxine (SYNTHROID, LEVOTHROID) 50 MCG tablet TAKE 1 TABLET BY MOUTH DAILY 90 tablet 3  . Magnesium 250 MG TABS Take by mouth.    . Multiple Vitamin (MULITIVITAMIN WITH MINERALS) TABS Take 1 tablet by mouth daily.    [name] Kitchen omeprazole (PRILOSEC) 20 MG capsule Take 40 mg by mouth daily.     [name] Kitchen omeprazole (PRILOSEC) 40 MG capsule TAKE 1 CAPSULE DAILY 90 capsule 4  . Probiotic Product (PROBIOTIC FORMULA PO) Take by mouth.    . terazosin (HYTRIN) 10 MG capsule Take 1 capsule (10 mg total) by mouth daily. 90 capsule 3  . traMADol (ULTRAM) 50 MG tablet TAKE 1/2 TO 1 TABLET BY MOUTH UP TO 4 TIMES A DAY AS NEEDED FOR PAIN 100 tablet 0   No current facility-administered medications on file prior to visit.   Medical History:  Past Medical History  Diagnosis Date  . Abnormal EKG     LBBB  . H/O echocardiogram   . Ulcer   . Allergy   . Arthritis   . Hypertension   . Thyroid disease   . Hyperlipidemia   . GERD (gastroesophageal reflux disease)   . Anxiety   . Depression   . COPD (chronic obstructive pulmonary disease)   .  Osteoporosis   . Prediabetes   . Venous insufficiency   . Vitamin D deficiency    Allergies:  Allergies  Allergen Reactions  . Aspirin   . Celebrex [Celecoxib]   . Fosamax [Alendronate Sodium]   . Keflex [Cephalexin]   . Penicillins   . Prednisone   . Prevacid [Lansoprazole]   . Sulfa Antibiotics   . Tetanus Toxoids   . Tetracyclines & Related     Review of Systems:  Review of Systems  Constitutional: Negative.   Respiratory: Negative.   Cardiovascular: Positive for leg swelling. Negative for chest pain, palpitations, orthopnea, claudication and PND.  Gastrointestinal: Negative.   Genitourinary: Negative.   Musculoskeletal: Positive for myalgias, back pain, joint pain and neck pain. Negative for falls.   Neurological: Negative.   Psychiatric/Behavioral: Negative.      Family history- Review and unchanged Social history- Review and unchanged Physical Exam: BP 138/80 mmHg  Pulse 100  Temp(Src) 97.7 F (36.5 C)  Resp 16  Ht 5' 1.25" (1.556 m)  Wt 181 lb (82.101 kg)  BMI 33.91 kg/m2 Wt Readings from Last 3 Encounters:  11/07/14 181 lb (82.101 kg)  08/01/14 179 lb 9.6 oz (81.466 kg)  06/25/14 177 lb (80.287 kg)   General Appearance: Well nourished, in no apparent distress. Eyes: PERRLA, EOMs, conjunctiva no swelling or erythema Sinuses: No Frontal/maxillary tenderness ENT/Mouth: Ext aud canals clear, TMs without erythema, bulging. No erythema, swelling, or exudate on post pharynx.  Tonsils not swollen or erythematous. Hearing normal.  Neck: Supple, thyroid normal.  Respiratory: Respiratory effort normal, BS equal bilaterally without rales, rhonchi, wheezing or stridor.  Cardio: RRR with no MRGs. Brisk peripheral pulses without edema.  Abdomen: Soft, + BS.  Non tender, no guarding, rebound, hernias, masses. Lymphatics: Non tender without lymphadenopathy.  Musculoskeletal: Full ROM, 5/5 strength, normal gait.  Skin: Warm, dry without rashes, lesions, ecchymosis.  Neuro: Cranial nerves intact. Normal muscle tone, no cerebellar symptoms. Sensation intact.  Psych: Awake and oriented X 3, normal affect, Insight and Judgment appropriate.    Vicie Mutters, PA-C 2:01 PM [facility]

## 2014-11-08 LAB — CBC WITH DIFFERENTIAL/PLATELET
Basophils Absolute: 0 10*3/uL (ref 0.0–0.1)
Basophils Relative: 0 % (ref 0–1)
Eosinophils Absolute: 0 10*3/uL (ref 0.0–0.7)
Eosinophils Relative: 1 % (ref 0–5)
HCT: 37.7 % (ref 36.0–46.0)
Hemoglobin: 13.1 g/dL (ref 12.0–15.0)
Lymphocytes Relative: 33 % (ref 12–46)
Lymphs Abs: 1.6 10*3/uL (ref 0.7–4.0)
MCH: 30 pg (ref 26.0–34.0)
MCHC: 34.7 g/dL (ref 30.0–36.0)
MCV: 86.3 fL (ref 78.0–100.0)
MPV: 10.4 fL (ref 9.4–12.4)
Monocytes Absolute: 0.7 10*3/uL (ref 0.1–1.0)
Monocytes Relative: 14 % — ABNORMAL HIGH (ref 3–12)
Neutro Abs: 2.5 10*3/uL (ref 1.7–7.7)
Neutrophils Relative %: 52 % (ref 43–77)
Platelets: 177 10*3/uL (ref 150–400)
RBC: 4.37 MIL/uL (ref 3.87–5.11)
RDW: 14 % (ref 11.5–15.5)
WBC: 4.9 10*3/uL (ref 4.0–10.5)

## 2014-11-08 LAB — VITAMIN D 25 HYDROXY (VIT D DEFICIENCY, FRACTURES): Vit D, 25-Hydroxy: 74 ng/mL (ref 30–100)

## 2014-11-12 ENCOUNTER — Other Ambulatory Visit: Payer: Self-pay

## 2014-11-22 ENCOUNTER — Ambulatory Visit (INDEPENDENT_AMBULATORY_CARE_PROVIDER_SITE_OTHER): Payer: Medicare Other | Admitting: Physician Assistant

## 2014-11-22 VITALS — BP 138/64 | HR 84 | Temp 99.3°F | Resp 16 | Ht 61.25 in | Wt 178.8 lb

## 2014-11-22 DIAGNOSIS — L03116 Cellulitis of left lower limb: Secondary | ICD-10-CM

## 2014-11-22 MED ORDER — AZITHROMYCIN 250 MG PO TABS: ORAL_TABLET | ORAL | Status: DC

## 2014-11-22 MED ORDER — MUPIROCIN 2 % EX OINT: 1.0000 "application " | TOPICAL_OINTMENT | Freq: Every day | CUTANEOUS | Status: DC

## 2014-11-22 NOTE — Progress Notes (Signed)
Subjective:    Patient ID: Alyssa Schmitt, female    DOB: 05-02-1928, 80 y.o.   MRN: [id number]  HPI 80 y.o. female with HTN, varicose veins, leg edema, obesity, and preDM presents with drainage from left leg for 2 days. She wears her compression hoses and applies daily lotion to her legs but noticed drainage from her anterior left leg 2 days ago, she states she has been sitting more often, she denies fever, chills, warmth, redness.   Past Medical History  Diagnosis Date  . Abnormal EKG     LBBB  . H/O echocardiogram   . Ulcer   . Allergy   . Arthritis   . Hypertension   . Thyroid disease   . Hyperlipidemia   . GERD (gastroesophageal reflux disease)   . Anxiety   . Depression   . COPD (chronic obstructive pulmonary disease)   . Osteoporosis   . Prediabetes   . Venous insufficiency   . Vitamin D deficiency    Current Outpatient Prescriptions on File Prior to Visit  Medication Sig Dispense Refill  . busPIRone (BUSPAR) 10 MG tablet TAKE 1/2 - 1 TABLET 3 TIMES A DAY AS NEEDED FOR ANXIETY 270 tablet 1  . calcium-vitamin D (OSCAL WITH D) 250-125 MG-UNIT per tablet Take 1 tablet by mouth daily.    . Cholecalciferol (VITAMIN D3) 2000 UNITS TABS Take 1 tablet by mouth 2 (two) times daily.     . citalopram (CELEXA) 20 MG tablet 1/2-1 pill daily 90 tablet 0  . EUFLEXXA 20 MG/2ML SOSY     . fexofenadine (ALLEGRA) 180 MG tablet Take 180 mg by mouth daily. 1/2 tab at night    . levothyroxine (SYNTHROID, LEVOTHROID) 50 MCG tablet TAKE 1 TABLET BY MOUTH DAILY 90 tablet 3  . Magnesium 250 MG TABS Take by mouth.    . Multiple Vitamin (MULITIVITAMIN WITH MINERALS) TABS Take 1 tablet by mouth daily.    [name] Kitchen omeprazole (PRILOSEC) 20 MG capsule Take 40 mg by mouth daily.     [name] Kitchen omeprazole (PRILOSEC) 40 MG capsule TAKE 1 CAPSULE DAILY 90 capsule 4  . Probiotic Product (PROBIOTIC FORMULA PO) Take by mouth.    . terazosin (HYTRIN) 10 MG capsule Take 1 capsule (10 mg total) by mouth daily. 90 capsule  3  . traMADol (ULTRAM) 50 MG tablet TAKE 1/2 TO 1 TABLET BY MOUTH UP TO 4 TIMES A DAY AS NEEDED FOR PAIN 100 tablet 0   No current facility-administered medications on file prior to visit.     Review of Systems  Constitutional: Negative.  Negative for fever and chills.  HENT: Negative.   Respiratory: Negative.   Cardiovascular: Positive for leg swelling. Negative for chest pain and palpitations.  Gastrointestinal: Negative.   Genitourinary: Negative.   Musculoskeletal: Negative.   Skin: Positive for wound. Negative for color change, pallor and rash.  Neurological: Negative.   Psychiatric/Behavioral: Negative.        Objective:   Physical Exam  Constitutional: She appears well-developed and well-nourished.  HENT:  Head: Normocephalic and atraumatic.  Cardiovascular: Normal rate.   Murmur (holosystolic murmur) heard. Pulmonary/Chest: Effort normal and breath sounds normal. She has no wheezes.  Abdominal: Soft. Bowel sounds are normal. There is no tenderness.  Musculoskeletal: She exhibits edema.  Bilateral legs with stasis dermatitis, left leg with erythema, warmth, and seroussanginous fluid from small tear 3x73mm anterior left shin, no streaking, no pus. Decreased sensation distal, good pulses, and good cap refill right leg with  stasis dermatitis, no signs of infection. Walks with a cane  Skin: Skin is warm and dry.  Psychiatric: She has a normal mood and affect. Her behavior is normal.       Assessment & Plan:  Cellulitis- Mupirocin applied QD Elevated feet, continue compression stockings  Zithromax prescribed. Neurosurgeon distributed. Wound dressing applied. Follow up in 4 weeks for wound check.  Or sooner for wound check

## 2014-11-22 NOTE — Patient Instructions (Signed)
Edema Edema is an abnormal buildup of fluids in your bodytissues. Edema is somewhatdependent on gravity to pull the fluid to the lowest place in your body. That makes the condition more common in the legs and thighs (lower extremities). Painless swelling of the feet and ankles is common and becomes more likely as you get older. It is also common in looser tissues, like around your eyes.  When the affected area is squeezed, the fluid may move out of that spot and leave a dent for a few moments. This dent is called pitting.  CAUSES  There are many possible causes of edema. Eating too much salt and being on your feet or sitting for a long time can cause edema in your legs and ankles. Hot weather may make edema worse. Common medical causes of edema include:  Heart failure.  Liver disease.  Kidney disease.  Weak blood vessels in your legs.  Cancer.  An injury.  Pregnancy.  Some medications.  Obesity. SYMPTOMS  Edema is usually painless.Your skin may look swollen or shiny.  DIAGNOSIS  Your health care provider may be able to diagnose edema by asking about your medical history and doing a physical exam. You may need to have tests such as X-rays, an electrocardiogram, or blood tests to check for medical conditions that may cause edema.  TREATMENT  Edema treatment depends on the cause. If you have heart, liver, or kidney disease, you need the treatment appropriate for these conditions. General treatment may include:  Elevation of the affected body part above the level of your heart.  Compression of the affected body part. Pressure from elastic bandages or support stockings squeezes the tissues and forces fluid back into the blood vessels. This keeps fluid from entering the tissues.  Restriction of fluid and salt intake.  Use of a water pill (diuretic). These medications are appropriate only for some types of edema. They pull fluid out of your body and make you urinate more often. This  gets rid of fluid and reduces swelling, but diuretics can have side effects. Only use diuretics as directed by your health care provider. HOME CARE INSTRUCTIONS   Keep the affected body part above the level of your heart when you are lying down.   Do not sit still or stand for prolonged periods.   Do not put anything directly under your knees when lying down.  Do not wear constricting clothing or garters on your upper legs.   Exercise your legs to work the fluid back into your blood vessels. This may help the swelling go down.   Wear elastic bandages or support stockings to reduce ankle swelling as directed by your health care provider.   Eat a low-salt diet to reduce fluid if your health care provider recommends it.   Only take medicines as directed by your health care provider. SEEK MEDICAL CARE IF:   Your edema is not responding to treatment.  You have heart, liver, or kidney disease and notice symptoms of edema.  You have edema in your legs that does not improve after elevating them.   You have sudden and unexplained weight gain. SEEK IMMEDIATE MEDICAL CARE IF:   You develop shortness of breath or chest pain.   You cannot breathe when you lie down.  You develop pain, redness, or warmth in the swollen areas.   You have heart, liver, or kidney disease and suddenly get edema.  You have a fever and your symptoms suddenly get worse. MAKE SURE YOU:     Understand these instructions.  Will watch your condition.  Will get help right away if you are not doing well or get worse. Document Released: 11/23/2005 Document Revised: 04/09/2014 Document Reviewed: 09/15/2013 Exit[employer] Patient Information 2015 [employer]. This information is not intended to replace advice given to you by your health care provider. Make sure you discuss any questions you have with your health care provider. Cellulitis Cellulitis is an infection of the skin and the tissue beneath it. The  infected area is usually red and tender. Cellulitis occurs most often in the arms and lower legs.  CAUSES  Cellulitis is caused by bacteria that enter the skin through cracks or cuts in the skin. The most common types of bacteria that cause cellulitis are staphylococci and streptococci. SIGNS AND SYMPTOMS   Redness and warmth.  Swelling.  Tenderness or pain.  Fever. DIAGNOSIS  Your health care provider can usually determine what is wrong based on a physical exam. Blood tests may also be done. TREATMENT  Treatment usually involves taking an antibiotic medicine. HOME CARE INSTRUCTIONS   Take your antibiotic medicine as directed by your health care provider. Finish the antibiotic even if you start to feel better.  Keep the infected arm or leg elevated to reduce swelling.  Apply a warm cloth to the affected area up to 4 times per day to relieve pain.  Take medicines only as directed by your health care provider.  Keep all follow-up visits as directed by your health care provider. SEEK MEDICAL CARE IF:   You notice red streaks coming from the infected area.  Your red area gets larger or turns dark in color.  Your bone or joint underneath the infected area becomes painful after the skin has healed.  Your infection returns in the same area or another area.  You notice a swollen bump in the infected area.  You develop new symptoms.  You have a fever. SEEK IMMEDIATE MEDICAL CARE IF:   You feel very sleepy.  You develop vomiting or diarrhea.  You have a general ill feeling (malaise) with muscle aches and pains. MAKE SURE YOU:   Understand these instructions.  Will watch your condition.  Will get help right away if you are not doing well or get worse. Document Released: 09/02/2005 Document Revised: 04/09/2014 Document Reviewed: 02/08/2012 Exit[employer] Patient Information 2015 [employer]. This information is not intended to replace advice given to you by your health care  provider. Make sure you discuss any questions you have with your health care provider.  

## 2014-11-23 ENCOUNTER — Other Ambulatory Visit: Payer: Self-pay | Admitting: Physician Assistant

## 2014-11-23 ENCOUNTER — Telehealth: Payer: Self-pay | Admitting: Physician Assistant

## 2014-11-23 DIAGNOSIS — I83009 Varicose veins of unspecified lower extremity with ulcer of unspecified site: Secondary | ICD-10-CM

## 2014-11-23 DIAGNOSIS — L97909 Non-pressure chronic ulcer of unspecified part of unspecified lower leg with unspecified severity: Principal | ICD-10-CM

## 2014-11-23 DIAGNOSIS — R600 Localized edema: Secondary | ICD-10-CM

## 2014-11-23 NOTE — Telephone Encounter (Signed)
Patient called to say she is also having some drainage of her right leg. She denies swelling, redness, warmth, CP, SOB. She is on the antibiotics and putting ointment on it which has helped the left leg, asking if she can also put it on the right leg. Instructed patient to apply to right and left as needed, finish ABX, elevate legs, go to the ER if any CP/SOB. She will call the office Monday, we will get [place] bilateral legs to rule out DVT. Patient is high risk due to immobility.

## 2014-12-03 ENCOUNTER — Encounter: Payer: Self-pay | Admitting: Internal Medicine

## 2014-12-03 ENCOUNTER — Ambulatory Visit (INDEPENDENT_AMBULATORY_CARE_PROVIDER_SITE_OTHER): Payer: Medicare Other | Admitting: Internal Medicine

## 2014-12-03 VITALS — BP 140/82 | HR 64 | Temp 97.9°F | Resp 16 | Ht 61.0 in | Wt 177.4 lb

## 2014-12-03 DIAGNOSIS — I8312 Varicose veins of left lower extremity with inflammation: Secondary | ICD-10-CM

## 2014-12-03 DIAGNOSIS — L03116 Cellulitis of left lower limb: Secondary | ICD-10-CM

## 2014-12-03 DIAGNOSIS — I872 Venous insufficiency (chronic) (peripheral): Secondary | ICD-10-CM

## 2014-12-03 DIAGNOSIS — L03115 Cellulitis of right lower limb: Secondary | ICD-10-CM

## 2014-12-03 DIAGNOSIS — I8311 Varicose veins of right lower extremity with inflammation: Secondary | ICD-10-CM

## 2014-12-03 MED ORDER — TRIAMCINOLONE ACETONIDE 0.1 % EX CREA: 1.0000 "application " | TOPICAL_CREAM | Freq: Three times a day (TID) | CUTANEOUS | Status: AC

## 2014-12-03 MED ORDER — DOXYCYCLINE HYCLATE 100 MG PO CAPS: ORAL_CAPSULE | ORAL | Status: DC

## 2014-12-03 NOTE — Patient Instructions (Signed)
  Apply the cream to raw areas on legs 2 to 3 x/daily   and wrap with [employer]

## 2014-12-03 NOTE — Progress Notes (Signed)
   Subjective:    Patient ID: Alyssa Schmitt, female    DOB: Apr 09, 1928, 80 y.o.   MRN: [id number]  HPI 80 yo WWF returns 10 s/p tx with Z pak for suspected Bilat cellulitis of a severe chronic venous stasis dermatitis of the legs. She reports severe burning with the Bactroban oint that she's been using and continues to have some weeping from both shins. No fever/chills.  Medication Sig  . busPIRone  10 MG tablet TAKE 1/2 - 1 TABLET 3 TIMES A DAY AS NEEDED FOR ANXIETY  . OSCAL WITH D 250-125 MG-UNIT  Take 1 tablet by mouth daily.  [name] Kitchen VITAMIN D 2000  Take 1 tablet by mouth 2 (two) times daily.   . citalopram (CELEXA) 20 MG tablet 1/2-1 pill daily  . EUFLEXXA 20 MG/2ML SOSY   . fexofenadine  180 MG tablet Take 180 mg by mouth daily. 1/2 tab at night  . levothyroxine  50 MCG tablet TAKE 1 TABLET BY MOUTH DAILY  . Magnesium 250 MG TABS Take by mouth.  . Multiple Vitamin  Take 1 tablet by mouth daily.  [name] Kitchen omeprazole  40 MG capsule TAKE 1 CAPSULE DAILY  . Probiotic Product  Take by mouth.  . terazosin  10 MG capsule Take 1 capsule (10 mg total) by mouth daily.  . traMADol 50 MG tablet TAKE 1/2 TO 1 TABLET BY MOUTH UP TO 4 TIMES A DAY AS NEEDED FOR PAIN  . mupirocin oint (BACTROBAN) 2 % Apply 1 application topically daily.   Allergies  Allergen Reactions  . Aspirin   . Celebrex [Celecoxib]   . Fosamax [Alendronate Sodium]   . Keflex [Cephalexin]   . Penicillins   . Prednisone   . Prevacid [Lansoprazole]   . Sulfa Antibiotics   . Tetanus Toxoids   . Tetracyclines & Related  Nausea   Past Medical History  Diagnosis Date  . Abnormal EKG     LBBB  . H/O echocardiogram   . Ulcer   . Allergy   . Arthritis   . Hypertension   . Thyroid disease   . Hyperlipidemia   . GERD (gastroesophageal reflux disease)   . Anxiety   . Depression   . COPD (chronic obstructive pulmonary disease)   . Osteoporosis   . Prediabetes   . Venous insufficiency   . Vitamin D deficiency    Review of  Systems non contributory to Above    Objective:   Physical Exam  BP 140/82 mmHg  Pulse 64  Temp(Src) 97.9 F (36.6 C)  Resp 16  Ht 5\' 1"  (1.549 m)  Wt 177 lb 6.4 oz (80.468 kg)  BMI 33.54 kg/m2   Exam focused on lower extremities shows bilat severe brownish hemosiderin pigmented venous stasis dermatitis with several ares of weeping of a clear to serous type drainage. No signs of lymphangitis and no purulence in the drainage . No notable warmth to palpitation. Neg Homan's sign bilat./ DPP 1+/1+    Assessment & Plan:   1. Cellulitis of both lower extremities   2. Venous stasis dermatitis of both lower extremities  Rx- Doxycyline to cover any infection with precautions to take with food due to hx/o nausea with Tcn family of Abx's.   Rx- Triamcinolone 0.1 % crm bid with occlusive Saran wrap

## 2014-12-06 ENCOUNTER — Other Ambulatory Visit: Payer: Self-pay | Admitting: Physician Assistant

## 2014-12-15 ENCOUNTER — Emergency Department (HOSPITAL_COMMUNITY): Payer: Medicare Other

## 2014-12-15 ENCOUNTER — Inpatient Hospital Stay (HOSPITAL_COMMUNITY)
Admission: EM | Admit: 2014-12-15 | Discharge: 2014-12-21 | DRG: 603 | Disposition: A | Discharge status: Skilled Nursing Facility | Payer: Medicare Other | Attending: Internal Medicine | Admitting: Internal Medicine

## 2014-12-15 ENCOUNTER — Encounter (HOSPITAL_COMMUNITY): Payer: Self-pay

## 2014-12-15 DIAGNOSIS — I83023 Varicose veins of left lower extremity with ulcer of ankle: Secondary | ICD-10-CM | POA: Diagnosis not present

## 2014-12-15 DIAGNOSIS — R52 Pain, unspecified: Secondary | ICD-10-CM

## 2014-12-15 DIAGNOSIS — I517 Cardiomegaly: Secondary | ICD-10-CM | POA: Diagnosis not present

## 2014-12-15 DIAGNOSIS — I501 Left ventricular failure: Secondary | ICD-10-CM | POA: Diagnosis not present

## 2014-12-15 DIAGNOSIS — M6281 Muscle weakness (generalized): Secondary | ICD-10-CM | POA: Diagnosis not present

## 2014-12-15 DIAGNOSIS — R279 Unspecified lack of coordination: Secondary | ICD-10-CM | POA: Diagnosis not present

## 2014-12-15 DIAGNOSIS — J439 Emphysema, unspecified: Secondary | ICD-10-CM | POA: Diagnosis not present

## 2014-12-15 DIAGNOSIS — A419 Sepsis, unspecified organism: Secondary | ICD-10-CM

## 2014-12-15 DIAGNOSIS — N390 Urinary tract infection, site not specified: Secondary | ICD-10-CM | POA: Diagnosis present

## 2014-12-15 DIAGNOSIS — I83028 Varicose veins of left lower extremity with ulcer other part of lower leg: Secondary | ICD-10-CM

## 2014-12-15 DIAGNOSIS — J438 Other emphysema: Secondary | ICD-10-CM

## 2014-12-15 DIAGNOSIS — M79609 Pain in unspecified limb: Secondary | ICD-10-CM | POA: Diagnosis not present

## 2014-12-15 DIAGNOSIS — R609 Edema, unspecified: Secondary | ICD-10-CM | POA: Diagnosis not present

## 2014-12-15 DIAGNOSIS — I83029 Varicose veins of left lower extremity with ulcer of unspecified site: Secondary | ICD-10-CM

## 2014-12-15 DIAGNOSIS — I872 Venous insufficiency (chronic) (peripheral): Secondary | ICD-10-CM | POA: Diagnosis present

## 2014-12-15 DIAGNOSIS — E559 Vitamin D deficiency, unspecified: Secondary | ICD-10-CM | POA: Diagnosis present

## 2014-12-15 DIAGNOSIS — L03115 Cellulitis of right lower limb: Secondary | ICD-10-CM | POA: Diagnosis not present

## 2014-12-15 DIAGNOSIS — R600 Localized edema: Secondary | ICD-10-CM

## 2014-12-15 DIAGNOSIS — I83022 Varicose veins of left lower extremity with ulcer of calf: Secondary | ICD-10-CM

## 2014-12-15 DIAGNOSIS — M62838 Other muscle spasm: Secondary | ICD-10-CM | POA: Diagnosis not present

## 2014-12-15 DIAGNOSIS — F419 Anxiety disorder, unspecified: Secondary | ICD-10-CM | POA: Diagnosis not present

## 2014-12-15 DIAGNOSIS — I83021 Varicose veins of left lower extremity with ulcer of thigh: Secondary | ICD-10-CM

## 2014-12-15 DIAGNOSIS — I83223 Varicose veins of left lower extremity with both ulcer of ankle and inflammation: Secondary | ICD-10-CM | POA: Diagnosis not present

## 2014-12-15 DIAGNOSIS — L03116 Cellulitis of left lower limb: Secondary | ICD-10-CM

## 2014-12-15 DIAGNOSIS — I83025 Varicose veins of left lower extremity with ulcer other part of foot: Secondary | ICD-10-CM

## 2014-12-15 DIAGNOSIS — L97909 Non-pressure chronic ulcer of unspecified part of unspecified lower leg with unspecified severity: Secondary | ICD-10-CM

## 2014-12-15 DIAGNOSIS — J449 Chronic obstructive pulmonary disease, unspecified: Secondary | ICD-10-CM | POA: Diagnosis present

## 2014-12-15 DIAGNOSIS — I83024 Varicose veins of left lower extremity with ulcer of heel and midfoot: Secondary | ICD-10-CM | POA: Diagnosis not present

## 2014-12-15 DIAGNOSIS — K449 Diaphragmatic hernia without obstruction or gangrene: Secondary | ICD-10-CM | POA: Diagnosis not present

## 2014-12-15 DIAGNOSIS — I10 Essential (primary) hypertension: Secondary | ICD-10-CM | POA: Diagnosis present

## 2014-12-15 DIAGNOSIS — F329 Major depressive disorder, single episode, unspecified: Secondary | ICD-10-CM | POA: Diagnosis present

## 2014-12-15 DIAGNOSIS — E039 Hypothyroidism, unspecified: Secondary | ICD-10-CM | POA: Diagnosis not present

## 2014-12-15 DIAGNOSIS — I83009 Varicose veins of unspecified lower extremity with ulcer of unspecified site: Secondary | ICD-10-CM

## 2014-12-15 DIAGNOSIS — L0390 Cellulitis, unspecified: Secondary | ICD-10-CM | POA: Diagnosis not present

## 2014-12-15 DIAGNOSIS — R2689 Other abnormalities of gait and mobility: Secondary | ICD-10-CM | POA: Diagnosis not present

## 2014-12-15 DIAGNOSIS — R509 Fever, unspecified: Secondary | ICD-10-CM | POA: Diagnosis not present

## 2014-12-15 DIAGNOSIS — E785 Hyperlipidemia, unspecified: Secondary | ICD-10-CM | POA: Diagnosis not present

## 2014-12-15 DIAGNOSIS — E038 Other specified hypothyroidism: Secondary | ICD-10-CM

## 2014-12-15 DIAGNOSIS — I83008 Varicose veins of unspecified lower extremity with ulcer other part of lower leg: Secondary | ICD-10-CM | POA: Diagnosis not present

## 2014-12-15 LAB — URINALYSIS, ROUTINE W REFLEX MICROSCOPIC
Bilirubin Urine: NEGATIVE
Glucose, UA: NEGATIVE mg/dL
Hgb urine dipstick: NEGATIVE
Ketones, ur: NEGATIVE mg/dL
Nitrite: NEGATIVE
Protein, ur: NEGATIVE mg/dL
Specific Gravity, Urine: 1.019 (ref 1.005–1.030)
Urobilinogen, UA: 0.2 mg/dL (ref 0.0–1.0)
pH: 6.5 (ref 5.0–8.0)

## 2014-12-15 LAB — CBC WITH DIFFERENTIAL/PLATELET
Basophils Absolute: 0 K/uL (ref 0.0–0.1)
Basophils Relative: 0 % (ref 0–1)
Eosinophils Absolute: 0 K/uL (ref 0.0–0.7)
Eosinophils Relative: 0 % (ref 0–5)
HCT: 36.9 % (ref 36.0–46.0)
Hemoglobin: 12.4 g/dL (ref 12.0–15.0)
Lymphocytes Relative: 6 % — ABNORMAL LOW (ref 12–46)
Lymphs Abs: 0.7 K/uL (ref 0.7–4.0)
MCH: 29.6 pg (ref 26.0–34.0)
MCHC: 33.6 g/dL (ref 30.0–36.0)
MCV: 88.1 fL (ref 78.0–100.0)
Monocytes Absolute: 0.8 K/uL (ref 0.1–1.0)
Monocytes Relative: 8 % (ref 3–12)
Neutro Abs: 8.9 K/uL — ABNORMAL HIGH (ref 1.7–7.7)
Neutrophils Relative %: 86 % — ABNORMAL HIGH (ref 43–77)
Platelets: 154 K/uL (ref 150–400)
RBC: 4.19 MIL/uL (ref 3.87–5.11)
RDW: 13.5 % (ref 11.5–15.5)
WBC: 10.5 K/uL (ref 4.0–10.5)

## 2014-12-15 LAB — BASIC METABOLIC PANEL
Anion gap: 8 (ref 5–15)
BUN: 18 mg/dL (ref 6–23)
CO2: 26 mmol/L (ref 19–32)
Calcium: 9.1 mg/dL (ref 8.4–10.5)
Chloride: 99 mEq/L (ref 96–112)
Creatinine, Ser: 1.01 mg/dL (ref 0.50–1.10)
GFR calc Af Amer: 58 mL/min — ABNORMAL LOW (ref >90)
GFR calc non Af Amer: 50 mL/min — ABNORMAL LOW (ref >90)
Glucose, Bld: 117 mg/dL — ABNORMAL HIGH (ref 70–99)
Potassium: 3.9 mmol/L (ref 3.5–5.1)
Sodium: 133 mmol/L — ABNORMAL LOW (ref 135–145)

## 2014-12-15 LAB — URINE MICROSCOPIC-ADD ON

## 2014-12-15 LAB — I-STAT CG4 LACTIC ACID, ED: Lactic Acid, Venous: 2.36 mmol/L — ABNORMAL HIGH (ref 0.5–2.2)

## 2014-12-15 LAB — PREGNANCY, URINE: Preg Test, Ur: NEGATIVE

## 2014-12-15 MED ORDER — SODIUM CHLORIDE 0.9 % IV SOLN
1250.0000 mg | INTRAVENOUS | Status: DC
  Administered 2014-12-16: 1250 mg via INTRAVENOUS
  Filled 2014-12-15: qty 1250

## 2014-12-15 MED ORDER — VANCOMYCIN HCL 10 G IV SOLR
1250.0000 mg | Freq: Once | INTRAVENOUS | Status: AC
  Administered 2014-12-15: 1250 mg via INTRAVENOUS
  Filled 2014-12-15: qty 1250

## 2014-12-15 MED ORDER — SODIUM CHLORIDE 0.9 % IV BOLUS (SEPSIS)
1000.0000 mL | Freq: Once | INTRAVENOUS | Status: AC
  Administered 2014-12-15: 1000 mL via INTRAVENOUS

## 2014-12-15 MED ORDER — ACETAMINOPHEN 325 MG PO TABS
650.0000 mg | ORAL_TABLET | Freq: Once | ORAL | Status: AC
  Administered 2014-12-15: 650 mg via ORAL
  Filled 2014-12-15: qty 2

## 2014-12-15 MED ORDER — SODIUM CHLORIDE 0.9 % IV SOLN
Freq: Once | INTRAVENOUS | Status: AC
  Administered 2014-12-15: 06:00:00 via INTRAVENOUS

## 2014-12-15 MED ORDER — TERAZOSIN HCL 5 MG PO CAPS
10.0000 mg | ORAL_CAPSULE | Freq: Every day | ORAL | Status: DC
  Administered 2014-12-15 – 2014-12-21 (×6): 10 mg via ORAL
  Filled 2014-12-15 (×8): qty 2

## 2014-12-15 MED ORDER — LEVOTHYROXINE SODIUM 50 MCG PO TABS
50.0000 ug | ORAL_TABLET | Freq: Every day | ORAL | Status: DC
  Administered 2014-12-15 – 2014-12-21 (×7): 50 ug via ORAL
  Filled 2014-12-15 (×9): qty 1

## 2014-12-15 MED ORDER — PANTOPRAZOLE SODIUM 40 MG PO TBEC
80.0000 mg | DELAYED_RELEASE_TABLET | Freq: Every day | ORAL | Status: DC
Start: 2014-12-15 — End: 2014-12-21
  Administered 2014-12-15 – 2014-12-21 (×7): 80 mg via ORAL
  Filled 2014-12-15 (×7): qty 2

## 2014-12-15 MED ORDER — LORATADINE 10 MG PO TABS
10.0000 mg | ORAL_TABLET | Freq: Every day | ORAL | Status: DC
  Administered 2014-12-15 – 2014-12-21 (×7): 10 mg via ORAL
  Filled 2014-12-15 (×7): qty 1

## 2014-12-15 MED ORDER — TRAMADOL HCL 50 MG PO TABS
50.0000 mg | ORAL_TABLET | Freq: Four times a day (QID) | ORAL | Status: DC | PRN
  Administered 2014-12-15 (×2): 100 mg via ORAL
  Administered 2014-12-16 – 2014-12-17 (×2): 50 mg via ORAL
  Administered 2014-12-17 – 2014-12-18 (×3): 100 mg via ORAL
  Administered 2014-12-18: 50 mg via ORAL
  Administered 2014-12-19 – 2014-12-21 (×6): 100 mg via ORAL
  Filled 2014-12-15 (×4): qty 2
  Filled 2014-12-15: qty 1
  Filled 2014-12-15: qty 2
  Filled 2014-12-15: qty 1
  Filled 2014-12-15 (×7): qty 2

## 2014-12-15 MED ORDER — ADULT MULTIVITAMIN W/MINERALS CH
1.0000 | ORAL_TABLET | Freq: Every day | ORAL | Status: DC
  Administered 2014-12-15 – 2014-12-21 (×7): 1 via ORAL
  Filled 2014-12-15 (×7): qty 1

## 2014-12-15 MED ORDER — VITAMIN D3 25 MCG (1000 UNIT) PO TABS
2000.0000 [IU] | ORAL_TABLET | Freq: Two times a day (BID) | ORAL | Status: DC
  Administered 2014-12-15 – 2014-12-21 (×13): 2000 [IU] via ORAL
  Filled 2014-12-15 (×17): qty 2

## 2014-12-15 MED ORDER — CITALOPRAM HYDROBROMIDE 10 MG PO TABS
5.0000 mg | ORAL_TABLET | Freq: Every day | ORAL | Status: DC
  Administered 2014-12-16 – 2014-12-20 (×5): 5 mg via ORAL
  Filled 2014-12-15 (×6): qty 1

## 2014-12-15 MED ORDER — TRAMADOL HCL 50 MG PO TABS
50.0000 mg | ORAL_TABLET | Freq: Once | ORAL | Status: AC
  Administered 2014-12-15: 50 mg via ORAL
  Filled 2014-12-15: qty 1

## 2014-12-15 MED ORDER — TRIAMCINOLONE ACETONIDE 0.1 % EX CREA
1.0000 "application " | TOPICAL_CREAM | Freq: Three times a day (TID) | CUTANEOUS | Status: DC
  Administered 2014-12-15 – 2014-12-16 (×6): 1 via TOPICAL
  Filled 2014-12-15 (×3): qty 15

## 2014-12-15 MED ORDER — CALCIUM CARBONATE-VITAMIN D 500-200 MG-UNIT PO TABS
1.0000 | ORAL_TABLET | Freq: Every day | ORAL | Status: DC
  Administered 2014-12-15 – 2014-12-21 (×7): 1 via ORAL
  Filled 2014-12-15 (×10): qty 1

## 2014-12-15 MED ORDER — ACETAMINOPHEN 325 MG PO TABS
650.0000 mg | ORAL_TABLET | Freq: Four times a day (QID) | ORAL | Status: DC | PRN
  Administered 2014-12-15 – 2014-12-21 (×6): 650 mg via ORAL
  Filled 2014-12-15 (×6): qty 2

## 2014-12-15 MED ORDER — BUSPIRONE HCL 10 MG PO TABS
5.0000 mg | ORAL_TABLET | Freq: Three times a day (TID) | ORAL | Status: DC | PRN
  Filled 2014-12-15: qty 1

## 2014-12-15 MED ORDER — SODIUM CHLORIDE 0.9 % IV SOLN
INTRAVENOUS | Status: DC
  Administered 2014-12-15 – 2014-12-18 (×4): via INTRAVENOUS

## 2014-12-15 MED ORDER — CITALOPRAM HYDROBROMIDE 10 MG PO TABS
10.0000 mg | ORAL_TABLET | Freq: Every day | ORAL | Status: DC
  Administered 2014-12-15: 10 mg via ORAL
  Filled 2014-12-15 (×2): qty 1

## 2014-12-15 MED ORDER — HEPARIN SODIUM (PORCINE) 5000 UNIT/ML IJ SOLN
5000.0000 [IU] | Freq: Three times a day (TID) | INTRAMUSCULAR | Status: DC
  Administered 2014-12-15 – 2014-12-21 (×19): 5000 [IU] via SUBCUTANEOUS
  Filled 2014-12-15 (×21): qty 1

## 2014-12-15 NOTE — Progress Notes (Signed)
Utilization review completed.  

## 2014-12-15 NOTE — Progress Notes (Signed)
Patient is increasingly drowsy and cellulitis has spreading up leg towards thigh. Also, new onset of purulent drainage to L lower leg. VSS. Text paged Dr. Sherral Hammers to notify

## 2014-12-15 NOTE — Progress Notes (Signed)
Spoke with Dr. Sherral Hammers on the telephone regarding text page. Dr. Sherral Hammers said that patient was very drowsy when he assessed her this AM as well.

## 2014-12-15 NOTE — Progress Notes (Signed)
[facility] PROGRESS NOTE  Alyssa Schmitt QZR:[id number] DOB: 1928/11/07 DOA: 12/15/2014 PCP: Alesia Richards, MD  Assessment/Plan: Cellulitis of bilateral ankle Lt > Rt -Continue vancomycin -Continue current skin care -No compression hose  Ulcerated varicose veins of leg -Stable currently don't appear to be contributed to present issues.  HTN -Terazosin 10 mg daily will currently hold as patients BP within AHA guidelines  HLD -Check lipid panel  COPD -Stable currently not on medication  Hypothyroidism -Continue Synthroid 50 g daily -Obtain TSH  Hiatal hernia -Will require GI consult as outpatient   Code Status: Full Family Communication: Daughter present Disposition Plan: Resolution cellulitis   Consultants: NA  Procedures: 1/9 PCXR;- Cardiac enlargement with mild vascular crowding. -No evidence of active pulmonary disease.  -Hiatal hernia behind the heart.  Cultures 1/9 urine pending 1/9 blood pending  Antibiotics: Vancomycin 1/9>>   HPI/Subjective: Alyssa Schmitt is a 81 y.o. WF PMHx depression, anxiety, LBBB, HTN, HLD, thyroid disease, COPD, prediabetes, venous insufficiency Presented to the ED with c/o fever, chills, rigors, body aches, pain in her LLE ankle which has had a venous stasis ulcer for teh past 2.5 weeks. Patient had been putting triamcinalone on BLE recently, and developed abrupt onset of fever and significant increase of pain at the site of ulcer over night this past evening. Fever is 102.3 in the ED. No cough, no SOB, no abd pain, no diarrhea, no dysuria. 1/9 per patient lower extremity signs and symptoms began around Christmas and progressively worsened. Was seen by PCP and treated with doxycycline + triamcinolone cream without resolution. Yesterday patient's bilateral lower extremity became extremely painful, erythematous, positive fever measured at 102F.    Objective: Filed Vitals:   12/15/14 0400 12/15/14 0415  12/15/14 0430 12/15/14 0620  BP: 134/66  143/62 125/49  Pulse: 79 83 84 77  Temp:    98.5 F (36.9 C)  TempSrc:    Oral  Resp: 21 20 16 16   Height:      Weight:      SpO2: 94% 92% 93% 94%   No intake or output data in the 24 hours ending 12/15/14 1332 Filed Weights   12/15/14 0216  Weight: 80.74 kg (178 lb)     Exam: General: A/O 4, NAD, No acute respiratory distress Lungs: Clear to auscultation bilaterally without wheezes or crackles Cardiovascular: Regular rate and rhythm positive grade 3/6 systolic murmur (previously evaluated per patient), negative gallop or rub normal S1 and S2 Abdomen: Nontender, nondistended, soft, bowel sounds positive, no rebound, no ascites, no appreciable mass Extremities: No significant cyanosis, clubbing, bilateral lower extremities 1-2+ pedal edema, erythema, warm to touch, painful to palpation, Lt > Rt   Data Reviewed: Basic Metabolic Panel:  Recent Labs Lab 12/15/14 0408  NA 133*  K 3.9  CL 99  CO2 26  GLUCOSE 117*  BUN 18  CREATININE 1.01  CALCIUM 9.1   Liver Function Tests: No results for input(s): AST, ALT, ALKPHOS, BILITOT, PROT, ALBUMIN in the last 168 hours. No results for input(s): LIPASE, AMYLASE in the last 168 hours. No results for input(s): AMMONIA in the last 168 hours. CBC:  Recent Labs Lab 12/15/14 0408  WBC 10.5  NEUTROABS 8.9*  HGB 12.4  HCT 36.9  MCV 88.1  PLT 154   Cardiac Enzymes: No results for input(s): CKTOTAL, CKMB, CKMBINDEX, TROPONINI in the last 168 hours. BNP (last 3 results) No results for input(s): PROBNP in the last 8760 hours. CBG: No results for input(s): GLUCAP in the  last 168 hours.  No results found for this or any previous visit (from the past 240 hour(s)).   Studies: Dg Chest Port 1 View  12/15/2014   CLINICAL DATA:  Fever since yesterday.  EXAM: PORTABLE CHEST - 1 VIEW  COMPARISON:  08/16/2012  FINDINGS: Shallow inspiration. Cardiac enlargement with mild prominence of pulmonary  vascularity, likely normal for technique. No focal airspace consolidation in the lungs. No blunting of costophrenic angles. No pneumothorax. Large esophageal hiatal hernia behind the heart.  IMPRESSION: Shallow inspiration. Cardiac enlargement with mild vascular crowding. No evidence of active pulmonary disease. Hiatal hernia behind the heart.   Electronically Signed   By: Lucienne Capers M.D.   On: 12/15/2014 05:31    Scheduled Meds: . calcium-vitamin D  1 tablet Oral Q breakfast  . cholecalciferol  2,000 Units Oral BID  . citalopram  10 mg Oral Daily  . heparin  5,000 Units Subcutaneous 3 times per day  . levothyroxine  50 mcg Oral QAC breakfast  . loratadine  10 mg Oral Daily  . multivitamin with minerals  1 tablet Oral Daily  . pantoprazole  80 mg Oral Daily  . terazosin  10 mg Oral Daily  . triamcinolone cream  1 application Topical TID  . [START ON 12/16/2014] vancomycin  1,250 mg Intravenous Q24H   Continuous Infusions: . sodium chloride      Principal Problem:   Cellulitis of left ankle Active Problems:   Ulcerated varicose veins of leg    Time spent: [place], [place], J  [facility] Pager [phone]. If 7PM-7AM, please contact night-coverage at [email or web address], password [facility] 12/15/2014, 1:32 PM  LOS: 0 days

## 2014-12-15 NOTE — Progress Notes (Signed)
ANTIBIOTIC CONSULT NOTE - INITIAL  Pharmacy Consult for Vancomycin  Indication: Cellulitis  Allergies  Allergen Reactions  . Aspirin   . Celebrex [Celecoxib]   . Fosamax [Alendronate Sodium]   . Keflex [Cephalexin]   . Penicillins   . Prednisone   . Prevacid [Lansoprazole]   . Sulfa Antibiotics   . Tetanus Toxoids   . Tetracyclines & Related Nausea Only    Nausea     Patient Measurements: Height: 5\' 3"  (160 cm) Weight: 178 lb (80.74 kg) IBW/kg (Calculated) : 52.4 Vital Signs: Temp: 102.3 F (39.1 C) (01/09 0229) Temp Source: Rectal (01/09 0229) BP: 141/63 mmHg (01/09 0344) Pulse Rate: 91 (01/09 0344)  Labs:  Recent Labs  12/15/14 0408  WBC 10.5  HGB 12.4  PLT 154  CREATININE 1.01   Estimated Creatinine Clearance: 41.7 mL/min (by C-G formula based on Cr of 1.01).  Medical History: Past Medical History  Diagnosis Date  . Abnormal EKG     LBBB  . H/O echocardiogram   . Ulcer   . Allergy   . Arthritis   . Hypertension   . Thyroid disease   . Hyperlipidemia   . GERD (gastroesophageal reflux disease)   . Anxiety   . Depression   . COPD (chronic obstructive pulmonary disease)   . Osteoporosis   . Prediabetes   . Venous insufficiency   . Vitamin D deficiency     Assessment: 81 y/o F with bilateral lower extremity cellulitis that appears refractory to treatment with Doxycyline, pt has multiple drug allergies (unsure of most reactions), WBC WNL, renal function appears ok for age, other labs as above.   Goal of Therapy:  Vancomycin trough level 10-15 mcg/ml  Plan:  -Vancomycin 1250 mg IV q24h -Trend WBC, temp, renal function  -Drug levels as indicated   Narda Bonds 12/15/2014,5:24 AM

## 2014-12-15 NOTE — ED Notes (Signed)
Transporting patient to new room assignment. 

## 2014-12-15 NOTE — ED Notes (Signed)
Per family pt has been dx with cellulites bilateral lower extremities; Reports to have increased pain on left ankle; pt has had broken skin on bilateral lower extremites with some weeping; pt has been on antibiotic for cellulites for past few weeks. Pt  C/o pain in left ankle at 8/10. Pt presents with fever on arrival.

## 2014-12-15 NOTE — ED Provider Notes (Signed)
CSN: [id number]     Arrival date & time 12/15/14  0201 History  This chart was scribed for Alyssa Clonts, MD by Jeanell Sparrow, ED Scribe. This patient was seen in room B15C/B15C and the patient's care was started at 3:42 AM.    Chief Complaint  Patient presents with  . Recurrent Skin Infections   The history is provided by the patient and a relative. No language interpreter was used.   HPI Comments: STEHANIE Schmitt is a 81 y.o. female who presents to the Emergency Department complaining of a constant moderate BLE rash that started 2.5 weeks ago. She reports that the skin started cracking and weeping, which she has a prior hx of. She states that pt was dx with cellulitis and has been on triamcinolone  recently. She reports that she started having a moderate subjective fever with chills and back pain. Pt's temperature in the ED is 102.3. She denies any cough, SOB, abdominal pain, or diarrhea.   PCP- [place]  Past Medical History  Diagnosis Date  . Abnormal EKG     LBBB  . H/O echocardiogram   . Ulcer   . Allergy   . Arthritis   . Hypertension   . Thyroid disease   . Hyperlipidemia   . GERD (gastroesophageal reflux disease)   . Anxiety   . Depression   . COPD (chronic obstructive pulmonary disease)   . Osteoporosis   . Prediabetes   . Venous insufficiency   . Vitamin D deficiency    Past Surgical History  Procedure Laterality Date  . Tonsillectomy      childhood  . Vesicovaginal fistula closure w/ tah     Family History  Problem Relation Age of Onset  . Heart disease Mother   . Other Mother     VARICOSE VEINS  . Diabetes Father   . Other Brother     VARICOSE VEINS   History  Substance Use Topics  . Smoking status: Never Smoker   . Smokeless tobacco: Never Used  . Alcohol Use: No   OB History    No data available     Review of Systems  Constitutional: Positive for fever.  Respiratory: Negative for cough and shortness of breath.   Gastrointestinal: Negative  for abdominal pain and diarrhea.  Musculoskeletal: Positive for back pain.  Skin: Positive for rash.  All other systems reviewed and are negative.   Allergies  Aspirin; Celebrex; Fosamax; Keflex; Penicillins; Prednisone; Prevacid; Sulfa antibiotics; Tetanus toxoids; and Tetracyclines & related  Home Medications   Prior to Admission medications   Medication Sig Start Date End Date Taking? Authorizing Provider  busPIRone (BUSPAR) 10 MG tablet TAKE 1/2 - 1 TABLET 3 TIMES A DAY AS NEEDED FOR ANXIETY 10/20/14  Yes Unk Pinto, MD  calcium-vitamin D (OSCAL WITH D) 250-125 MG-UNIT per tablet Take 1 tablet by mouth daily.   Yes Historical Provider, MD  Cholecalciferol (VITAMIN D3) 2000 UNITS TABS Take 4 tablets by mouth daily.    Yes Historical Provider, MD  citalopram (CELEXA) 20 MG tablet 1/2-1 pill daily 11/07/14  Yes Vicie Mutters, PA-C  fexofenadine (ALLEGRA) 180 MG tablet Take 180 mg by mouth daily.    Yes Historical Provider, MD  levothyroxine (SYNTHROID, LEVOTHROID) 50 MCG tablet TAKE 1 TABLET BY MOUTH DAILY 08/07/14  Yes Unk Pinto, MD  Magnesium 250 MG TABS Take 250 mg by mouth daily.    Yes Historical Provider, MD  Multiple Vitamin (MULITIVITAMIN WITH MINERALS) TABS Take 1 tablet  by mouth daily.   Yes Historical Provider, MD  omeprazole (PRILOSEC) 40 MG capsule TAKE 1 CAPSULE DAILY 09/13/14  Yes Unk Pinto, MD  Probiotic Product (PROBIOTIC FORMULA PO) Take 1 tablet by mouth daily.    Yes Historical Provider, MD  terazosin (HYTRIN) 10 MG capsule Take 1 capsule (10 mg total) by mouth daily. 10/24/14  Yes Unk Pinto, MD  traMADol (ULTRAM) 50 MG tablet TAKE 1/2-1 TABLET BY MOUTH UP TO 4 TIMES A DAY AS NEEDED FOR PAIN 12/08/14  Yes Vicie Mutters, PA-C  triamcinolone cream (KENALOG) 0.1 % Apply 1 application topically 3 (three) times daily. 12/03/14 02/03/15 Yes Unk Pinto, MD   BP 132/94 mmHg  Pulse 99  Temp(Src) 102.3 F (39.1 C) (Rectal)  Resp 23  Ht 5\' 3"  (1.6 m)   Wt 178 lb (80.74 kg)  BMI 31.54 kg/m2  SpO2 93% Physical Exam  Constitutional: She is oriented to person, place, and time. She appears well-developed and well-nourished. No distress.  HENT:  Head: Normocephalic and atraumatic.  Mild dry mm  Eyes: Pupils are equal, round, and reactive to light. Right eye exhibits no discharge. Left eye exhibits no discharge.  Neck: Neck supple. No tracheal deviation present.  Cardiovascular: Normal rate and regular rhythm.   Murmur heard. 2 to 3+ murmur in the left sternal.   Pulmonary/Chest: Effort normal and breath sounds normal. No respiratory distress. She has no wheezes.  Abdominal: Soft. There is no tenderness.  Musculoskeletal: Normal range of motion.  Back: No flank pain, no midline TTP.  Mild pitting edema.   Neurological: She is alert and oriented to person, place, and time.  Skin: Skin is warm and dry. There is erythema.  Chronic skin changes to BLE. Mild swelling. Dry skin on the lateral aspenct ot legs. Warmth to legs and ankle on LE. 2 cm indentation on the lateral left lower leg. Erythema to the lateral aspect of left ankle and foot. Worse on the left foot and ankle.   Erythema and warmth to right cheek.   Psychiatric: She has a normal mood and affect. Her behavior is normal.  Nursing note and vitals reviewed.   ED Course  Procedures (including critical care time) DIAGNOSTIC STUDIES: Oxygen Saturation is 93% on RA, normal by my interpretation.    COORDINATION OF CARE: 3:46 AM- Pt advised of plan for treatment which includes medication and labs and pt agrees.  Labs Review Labs Reviewed  CBC WITH DIFFERENTIAL - Abnormal; Notable for the following:    Neutrophils Relative % 86 (*)    Neutro Abs 8.9 (*)    Lymphocytes Relative 6 (*)    All other components within normal limits  BASIC METABOLIC PANEL - Abnormal; Notable for the following:    Sodium 133 (*)    Glucose, Bld 117 (*)    GFR calc non Af Amer 50 (*)    GFR calc Af  Amer 58 (*)    All other components within normal limits  URINALYSIS, ROUTINE W REFLEX MICROSCOPIC - Abnormal; Notable for the following:    APPearance CLOUDY (*)    Leukocytes, UA MODERATE (*)    All other components within normal limits  URINE MICROSCOPIC-ADD ON - Abnormal; Notable for the following:    Squamous Epithelial / LPF FEW (*)    Crystals CA OXALATE CRYSTALS (*)    All other components within normal limits  I-STAT CG4 LACTIC ACID, ED - Abnormal; Notable for the following:    Lactic Acid, Venous 2.36 (*)  All other components within normal limits  URINE CULTURE  CULTURE, BLOOD (ROUTINE X 2)  CULTURE, BLOOD (ROUTINE X 2)  PREGNANCY, URINE    Imaging Review Dg Chest Port 1 View  12/15/2014   CLINICAL DATA:  Fever since yesterday.  EXAM: PORTABLE CHEST - 1 VIEW  COMPARISON:  08/16/2012  FINDINGS: Shallow inspiration. Cardiac enlargement with mild prominence of pulmonary vascularity, likely normal for technique. No focal airspace consolidation in the lungs. No blunting of costophrenic angles. No pneumothorax. Large esophageal hiatal hernia behind the heart.  IMPRESSION: Shallow inspiration. Cardiac enlargement with mild vascular crowding. No evidence of active pulmonary disease. Hiatal hernia behind the heart.   Electronically Signed   By: Lucienne Capers M.D.   On: 12/15/2014 05:31     EKG Interpretation None      MDM   Final diagnoses:  Fever  Sepsis affecting skin   I personally performed the services described in this documentation, which was scribed in my presence. The recorded information has been reviewed and is accurate.  Patient presents with clinically cellulitis as likely cause of fever and chills. Lactate elevated and fever in ER. Antibiotics and cultures ordered. Paged medicine for admission as patient has been on doxycycline.  The patients results and plan were reviewed and discussed.   Any x-rays performed were personally reviewed by myself.    Differential diagnosis were considered with the presenting HPI.  Medications  acetaminophen (TYLENOL) tablet 650 mg (not administered)  heparin injection 5,000 Units (5,000 Units Subcutaneous Given 12/15/14 0655)  0.9 %  sodium chloride infusion (not administered)  busPIRone (BUSPAR) tablet 5-10 mg (not administered)  calcium-vitamin D (OSCAL WITH D) 500-200 MG-UNIT per tablet 1 tablet (not administered)  cholecalciferol (VITAMIN D) tablet 2,000 Units (not administered)  citalopram (CELEXA) tablet 10 mg (not administered)  loratadine (CLARITIN) tablet 10 mg (not administered)  levothyroxine (SYNTHROID, LEVOTHROID) tablet 50 mcg (not administered)  triamcinolone cream (KENALOG) 0.1 % 1 application (not administered)  traMADol (ULTRAM) tablet 50-100 mg (not administered)  terazosin (HYTRIN) capsule 10 mg (not administered)  pantoprazole (PROTONIX) EC tablet 80 mg (not administered)  multivitamin with minerals tablet 1 tablet (not administered)  vancomycin (VANCOCIN) 1,250 mg in sodium chloride 0.9 % 250 mL IVPB (not administered)  vancomycin (VANCOCIN) 1,250 mg in sodium chloride 0.9 % 250 mL IVPB (1,250 mg Intravenous New Bag/Given 12/15/14 0428)  acetaminophen (TYLENOL) tablet 650 mg (650 mg Oral Given 12/15/14 0427)  sodium chloride 0.9 % bolus 1,000 mL (0 mLs Intravenous Stopped 12/15/14 0537)  traMADol (ULTRAM) tablet 50 mg (50 mg Oral Given 12/15/14 0517)  0.9 %  sodium chloride infusion ( Intravenous New Bag/Given 12/15/14 0536)    Filed Vitals:   12/15/14 0400 12/15/14 0415 12/15/14 0430 12/15/14 0620  BP: 134/66  143/62 125/49  Pulse: 79 83 84 77  Temp:    98.5 F (36.9 C)  TempSrc:    Oral  Resp: 21 20 16 16   Height:      Weight:      SpO2: 94% 92% 93% 94%    Final diagnoses:  Fever  Sepsis affecting skin    Admission/ observation were discussed with the admitting physician, patient and/or family and they are comfortable with the plan.     Alyssa Clonts, MD 12/15/14  [phone]

## 2014-12-15 NOTE — ED Notes (Signed)
Dr Zavitz at bedside  

## 2014-12-15 NOTE — H&P (Signed)
[facility] History and Physical  Alyssa Schmitt XBJ:[id number] DOB: June 29, 1928 DOA: 12/15/2014  Referring physician: EDP PCP: Nadean Corwin, MD   Chief Complaint: Fever   HPI: Alyssa Schmitt is a 81 y.o. female who presents to the ED with c/o fever, chills, rigors, body aches, pain in her LLE ankle which has had a venous stasis ulcer for teh past 2.5 weeks.  Patient had been putting triamcinalone on BLE recently, and developed abrupt onset of fever and significant increase of pain at the site of ulcer over night this past evening.  Fever is 102.3 in the ED.  No cough, no SOB, no abd pain, no diarrhea, no dysuria.  Review of Systems: Systems reviewed.  As above, otherwise negative  Past Medical History  Diagnosis Date  . Abnormal EKG     LBBB  . H/O echocardiogram   . Ulcer   . Allergy   . Arthritis   . Hypertension   . Thyroid disease   . Hyperlipidemia   . GERD (gastroesophageal reflux disease)   . Anxiety   . Depression   . COPD (chronic obstructive pulmonary disease)   . Osteoporosis   . Prediabetes   . Venous insufficiency   . Vitamin D deficiency    Past Surgical History  Procedure Laterality Date  . Tonsillectomy      childhood  . Vesicovaginal fistula closure w/ tah     Social History:  reports that she has never smoked. She has never used smokeless tobacco. She reports that she does not drink alcohol or use illicit drugs.  Allergies  Allergen Reactions  . Aspirin   . Celebrex [Celecoxib]   . Fosamax [Alendronate Sodium]   . Keflex [Cephalexin]   . Penicillins   . Prednisone   . Prevacid [Lansoprazole]   . Sulfa Antibiotics   . Tetanus Toxoids   . Tetracyclines & Related Nausea Only    Nausea     Family History  Problem Relation Age of Onset  . Heart disease Mother   . Other Mother     VARICOSE VEINS  . Diabetes Father   . Other Brother     VARICOSE VEINS     Prior to Admission medications   Medication Sig Start Date End  Date Taking? Authorizing Provider  busPIRone (BUSPAR) 10 MG tablet TAKE 1/2 - 1 TABLET 3 TIMES A DAY AS NEEDED FOR ANXIETY 10/20/14   Lucky Cowboy, MD  calcium-vitamin D (OSCAL WITH D) 250-125 MG-UNIT per tablet Take 1 tablet by mouth daily.    Historical Provider, MD  Cholecalciferol (VITAMIN D3) 2000 UNITS TABS Take 1 tablet by mouth 2 (two) times daily.     Historical Provider, MD  citalopram (CELEXA) 20 MG tablet 1/2-1 pill daily 11/07/14   Quentin Mulling, PA-C  EUFLEXXA 20 MG/2ML SOSY  09/18/13   Historical Provider, MD  fexofenadine (ALLEGRA) 180 MG tablet Take 180 mg by mouth daily. 1/2 tab at night    Historical Provider, MD  levothyroxine (SYNTHROID, LEVOTHROID) 50 MCG tablet TAKE 1 TABLET BY MOUTH DAILY 08/07/14   Lucky Cowboy, MD  Magnesium 250 MG TABS Take by mouth.    Historical Provider, MD  Multiple Vitamin (MULITIVITAMIN WITH MINERALS) TABS Take 1 tablet by mouth daily.    Historical Provider, MD  omeprazole (PRILOSEC) 20 MG capsule Take 40 mg by mouth daily.     Historical Provider, MD  omeprazole (PRILOSEC) 40 MG capsule TAKE 1 CAPSULE DAILY 09/13/14   Lucky Cowboy, MD  Probiotic Product (PROBIOTIC FORMULA PO) Take by mouth.    Historical Provider, MD  terazosin (HYTRIN) 10 MG capsule Take 1 capsule (10 mg total) by mouth daily. 10/24/14   Lucky Cowboy, MD  traMADol (ULTRAM) 50 MG tablet TAKE 1/2-1 TABLET BY MOUTH UP TO 4 TIMES A DAY AS NEEDED FOR PAIN 12/08/14   Quentin Mulling, PA-C  triamcinolone cream (KENALOG) 0.1 % Apply 1 application topically 3 (three) times daily. 12/03/14 02/03/15  Lucky Cowboy, MD   Physical Exam: Filed Vitals:   12/15/14 0344  BP: 141/63  Pulse: 91  Temp:   Resp: 19    BP 141/63 mmHg  Pulse 91  Temp(Src) 102.3 F (39.1 C) (Rectal)  Resp 19  Ht 5\' 3"  (1.6 m)  Wt 80.74 kg (178 lb)  BMI 31.54 kg/m2  SpO2 94%  General Appearance:    Alert, oriented, no distress, appears stated age  Head:    Normocephalic, atraumatic  Eyes:     PERRL, EOMI, sclera non-icteric        Nose:   Nares without drainage or epistaxis. Mucosa, turbinates normal  Throat:   Moist mucous membranes. Oropharynx without erythema or exudate.  Neck:   Supple. No carotid bruits.  No thyromegaly.  No lymphadenopathy.   Back:     No CVA tenderness, no spinal tenderness  Lungs:     Clear to auscultation bilaterally, without wheezes, rhonchi or rales  Chest wall:    No tenderness to palpitation  Heart:    Regular rate and rhythm without murmurs, gallops, rubs  Abdomen:     Soft, non-tender, nondistended, normal bowel sounds, no organomegaly  Genitalia:    deferred  Rectal:    deferred  Extremities:   No clubbing, cyanosis or edema.  Pulses:   2+ and symmetric all extremities  Skin:   Skin color, texture, turgor normal, no rashes or lesions  Lymph nodes:   Cervical, supraclavicular, and axillary nodes normal  Neurologic:   CNII-XII intact. Normal strength, sensation and reflexes      throughout    Labs on Admission:  Basic Metabolic Panel:  Recent Labs Lab 12/15/14 0408  NA 133*  K 3.9  CL 99  CO2 26  GLUCOSE 117*  BUN 18  CREATININE 1.01  CALCIUM 9.1   Liver Function Tests: No results for input(s): AST, ALT, ALKPHOS, BILITOT, PROT, ALBUMIN in the last 168 hours. No results for input(s): LIPASE, AMYLASE in the last 168 hours. No results for input(s): AMMONIA in the last 168 hours. CBC:  Recent Labs Lab 12/15/14 0408  WBC 10.5  NEUTROABS 8.9*  HGB 12.4  HCT 36.9  MCV 88.1  PLT 154   Cardiac Enzymes: No results for input(s): CKTOTAL, CKMB, CKMBINDEX, TROPONINI in the last 168 hours.  BNP (last 3 results) No results for input(s): PROBNP in the last 8760 hours. CBG: No results for input(s): GLUCAP in the last 168 hours.  Radiological Exams on Admission: No results found.  EKG: Independently reviewed.  Assessment/Plan Principal Problem:   Cellulitis of left ankle Active Problems:   Ulcerated varicose veins of  leg   1. Cellulitis of a venous stasis ulcer of the left ankle - 1. Tylenol for fever, no other SIRS criteria at this point to complete sepsis diagnosis. 2. Blood cultures pending 3. Vancomycin per pharm consult 4. Repeat labs tomorrow AM, monitor for leukocytosis. 5. Wound care consult for venous stasis ulcer. 6. IVF: getting bolus in ED then 100 cc/hr thereafter. 7. Ultram for  pain, have increased dose to 50-100 Q6H PRN, may switch this to norco if needed.    Code Status: Full Code  Family Communication: Family at bedside Disposition Plan: Admit to inpatient   Time spent: 70 min  Dastan Krider M. [facility] Pager [phone]  If 7AM-7PM, please contact the day team taking care of the patient [email or web address] Password [facility] 12/15/2014, 5:23 AM

## 2014-12-16 DIAGNOSIS — E038 Other specified hypothyroidism: Secondary | ICD-10-CM | POA: Insufficient documentation

## 2014-12-16 DIAGNOSIS — I83018 Varicose veins of right lower extremity with ulcer other part of lower leg: Secondary | ICD-10-CM

## 2014-12-16 DIAGNOSIS — I83013 Varicose veins of right lower extremity with ulcer of ankle: Secondary | ICD-10-CM

## 2014-12-16 DIAGNOSIS — I83019 Varicose veins of right lower extremity with ulcer of unspecified site: Secondary | ICD-10-CM

## 2014-12-16 DIAGNOSIS — I83011 Varicose veins of right lower extremity with ulcer of thigh: Secondary | ICD-10-CM

## 2014-12-16 DIAGNOSIS — R609 Edema, unspecified: Secondary | ICD-10-CM

## 2014-12-16 DIAGNOSIS — I83015 Varicose veins of right lower extremity with ulcer other part of foot: Secondary | ICD-10-CM

## 2014-12-16 DIAGNOSIS — M79609 Pain in unspecified limb: Secondary | ICD-10-CM

## 2014-12-16 DIAGNOSIS — I83014 Varicose veins of right lower extremity with ulcer of heel and midfoot: Secondary | ICD-10-CM

## 2014-12-16 DIAGNOSIS — I83012 Varicose veins of right lower extremity with ulcer of calf: Secondary | ICD-10-CM

## 2014-12-16 LAB — TSH: TSH: 1.428 u[IU]/mL (ref 0.350–4.500)

## 2014-12-16 LAB — CBC WITH DIFFERENTIAL/PLATELET
Basophils Absolute: 0 10*3/uL (ref 0.0–0.1)
Basophils Relative: 0 % (ref 0–1)
Eosinophils Absolute: 0 10*3/uL (ref 0.0–0.7)
Eosinophils Relative: 0 % (ref 0–5)
HCT: 32.4 % — ABNORMAL LOW (ref 36.0–46.0)
Hemoglobin: 10.7 g/dL — ABNORMAL LOW (ref 12.0–15.0)
Lymphocytes Relative: 18 % (ref 12–46)
Lymphs Abs: 1.8 10*3/uL (ref 0.7–4.0)
MCH: 29 pg (ref 26.0–34.0)
MCHC: 33 g/dL (ref 30.0–36.0)
MCV: 87.8 fL (ref 78.0–100.0)
Monocytes Absolute: 1 10*3/uL (ref 0.1–1.0)
Monocytes Relative: 10 % (ref 3–12)
Neutro Abs: 7 10*3/uL (ref 1.7–7.7)
Neutrophils Relative %: 72 % (ref 43–77)
Platelets: 131 10*3/uL — ABNORMAL LOW (ref 150–400)
RBC: 3.69 MIL/uL — ABNORMAL LOW (ref 3.87–5.11)
RDW: 13.9 % (ref 11.5–15.5)
WBC: 9.8 10*3/uL (ref 4.0–10.5)

## 2014-12-16 LAB — COMPREHENSIVE METABOLIC PANEL
ALT: 17 U/L (ref 0–35)
AST: 26 U/L (ref 0–37)
Albumin: 2.9 g/dL — ABNORMAL LOW (ref 3.5–5.2)
Alkaline Phosphatase: 65 U/L (ref 39–117)
Anion gap: 6 (ref 5–15)
BUN: 10 mg/dL (ref 6–23)
CO2: 26 mmol/L (ref 19–32)
Calcium: 8.4 mg/dL (ref 8.4–10.5)
Chloride: 102 mEq/L (ref 96–112)
Creatinine, Ser: 0.86 mg/dL (ref 0.50–1.10)
GFR calc Af Amer: 70 mL/min — ABNORMAL LOW (ref >90)
GFR calc non Af Amer: 60 mL/min — ABNORMAL LOW (ref >90)
Glucose, Bld: 95 mg/dL (ref 70–99)
Potassium: 3.7 mmol/L (ref 3.5–5.1)
Sodium: 134 mmol/L — ABNORMAL LOW (ref 135–145)
Total Bilirubin: 1 mg/dL (ref 0.3–1.2)
Total Protein: 5.9 g/dL — ABNORMAL LOW (ref 6.0–8.3)

## 2014-12-16 LAB — LACTIC ACID, PLASMA: Lactic Acid, Venous: 0.8 mmol/L (ref 0.5–2.2)

## 2014-12-16 LAB — MAGNESIUM: Magnesium: 1.7 mg/dL (ref 1.5–2.5)

## 2014-12-16 LAB — LIPID PANEL
Cholesterol: 110 mg/dL (ref 0–200)
HDL: 47 mg/dL (ref >39)
LDL Cholesterol: 53 mg/dL (ref 0–99)
Total CHOL/HDL Ratio: 2.3 RATIO
Triglycerides: 49 mg/dL (ref <150)
VLDL: 10 mg/dL (ref 0–40)

## 2014-12-16 LAB — URINE CULTURE: Colony Count: 3000

## 2014-12-16 MED ORDER — BISACODYL 5 MG PO TBEC
5.0000 mg | DELAYED_RELEASE_TABLET | Freq: Two times a day (BID) | ORAL | Status: DC
  Administered 2014-12-16 – 2014-12-21 (×5): 5 mg via ORAL
  Filled 2014-12-16 (×10): qty 1

## 2014-12-16 MED ORDER — CLINDAMYCIN HCL 300 MG PO CAPS
300.0000 mg | ORAL_CAPSULE | Freq: Four times a day (QID) | ORAL | Status: DC
  Administered 2014-12-16 – 2014-12-17 (×5): 300 mg via ORAL
  Filled 2014-12-16 (×8): qty 1

## 2014-12-16 NOTE — Progress Notes (Signed)
VASCULAR LAB PRELIMINARY  PRELIMINARY  PRELIMINARY  PRELIMINARY  Bilateral lower extremity venous Dopplers completed.    Preliminary report:  There is no DVT or SVT noted in the bilateral lower extremities.   Sani Madariaga, RVT 12/16/2014, 12:15 PM

## 2014-12-16 NOTE — Progress Notes (Addendum)
[facility] PROGRESS NOTE  Alyssa Schmitt HAL:[id number] DOB: 1928/08/16 DOA: 12/15/2014 PCP: Alesia Richards, MD  Assessment/Plan: Cellulitis of bilateral ankle Lt > Rt -DC vancomycin, start clindamycin 300 mg QID for a 14 day course of antibiotics. -Continue current skin care -No compression hose  Ulcerated varicose veins of leg -Stable currently don't appear to be contributed to present issues.  HTN -Terazosin 10 mg daily will currently hold, allow progressive HTN and an 81 year old (studies have shown reduce risk of falls)  HLD -lipid panel within NCEP guidelines  COPD -Stable currently not on medication  Hypothyroidism -Continue Synthroid 50 g daily -TSH within normal limits  Hiatal hernia -Will require GI consult as outpatient   Code Status: Full Family Communication: Daughter present Disposition Plan: Resolution cellulitis   Consultants: NA  Procedures: 1/9 PCXR;- Cardiac enlargement with mild vascular crowding. -No evidence of active pulmonary disease.  -Hiatal hernia behind the heart.  Cultures 1/9 urine pending 1/9 blood pending  Antibiotics: Vancomycin 1/9>> stopped 1/10 Clindamycin 1/10>>   HPI/Subjective: Alyssa Schmitt is a 81 y.o. WF PMHx depression, anxiety, LBBB, HTN, HLD, thyroid disease, COPD, prediabetes, venous insufficiency Presented to the ED with c/o fever, chills, rigors, body aches, pain in her LLE ankle which has had a venous stasis ulcer for teh past 2.5 weeks. Patient had been putting triamcinalone on BLE recently, and developed abrupt onset of fever and significant increase of pain at the site of ulcer over night this past evening. Fever is 102.3 in the ED. No cough, no SOB, no abd pain, no diarrhea, no dysuria. 1/9 per patient lower extremity signs and symptoms began around Christmas and progressively worsened. Was seen by PCP and treated with doxycycline + triamcinolone cream without resolution. Yesterday  patient's bilateral lower extremity became extremely painful, erythematous, positive fever measured at 102F. 1/10 patient sitting comfortably in chair with decreased lower extremity pain.    Objective: Filed Vitals:   12/15/14 1434 12/15/14 2053 12/15/14 2254 12/16/14 0644  BP: 136/61 132/60  148/58  Pulse: 77 82  73  Temp: 99.7 F (37.6 C) 100.6 F (38.1 C) 99.9 F (37.7 C) 99.1 F (37.3 C)  TempSrc: Oral Oral Oral Oral  Resp: 18 18  20   Height:      Weight:      SpO2: 95% 92%  91%    Intake/Output Summary (Last 24 hours) at 12/16/14 1025 Last data filed at 12/15/14 1712  Gross per 24 hour  Intake    240 ml  Output      0 ml  Net    240 ml   Filed Weights   12/15/14 0216  Weight: 80.74 kg (178 lb)     Exam: General: A/O 4, NAD, No acute respiratory distress Lungs: Clear to auscultation bilaterally without wheezes or crackles Cardiovascular: Regular rate and rhythm positive grade 3/6 systolic murmur (previously evaluated per patient), negative gallop or rub normal S1 and S2 Abdomen: Nontender, nondistended, soft, bowel sounds positive, no rebound, no ascites, no appreciable mass Extremities: No significant cyanosis, clubbing, bilateral lower extremities 1-2+ pedal edema, erythema, warm to touch, painful to palpation, Lt > Rt; improved from 1/9 exam   Data Reviewed: Basic Metabolic Panel:  Recent Labs Lab 12/15/14 0408 12/16/14 0435  NA 133* 134*  K 3.9 3.7  CL 99 102  CO2 26 26  GLUCOSE 117* 95  BUN 18 10  CREATININE 1.01 0.86  CALCIUM 9.1 8.4  MG  --  1.7   Liver Function Tests:  Recent Labs Lab 12/16/14 0435  AST 26  ALT 17  ALKPHOS 65  BILITOT 1.0  PROT 5.9*  ALBUMIN 2.9*   No results for input(s): LIPASE, AMYLASE in the last 168 hours. No results for input(s): AMMONIA in the last 168 hours. CBC:  Recent Labs Lab 12/15/14 0408 12/16/14 0435  WBC 10.5 9.8  NEUTROABS 8.9* 7.0  HGB 12.4 10.7*  HCT 36.9 32.4*  MCV 88.1 87.8  PLT 154  131*   Cardiac Enzymes: No results for input(s): CKTOTAL, CKMB, CKMBINDEX, TROPONINI in the last 168 hours. BNP (last 3 results) No results for input(s): PROBNP in the last 8760 hours. CBG: No results for input(s): GLUCAP in the last 168 hours.  No results found for this or any previous visit (from the past 240 hour(s)).   Studies: Dg Chest Port 1 View  12/15/2014   CLINICAL DATA:  Fever since yesterday.  EXAM: PORTABLE CHEST - 1 VIEW  COMPARISON:  08/16/2012  FINDINGS: Shallow inspiration. Cardiac enlargement with mild prominence of pulmonary vascularity, likely normal for technique. No focal airspace consolidation in the lungs. No blunting of costophrenic angles. No pneumothorax. Large esophageal hiatal hernia behind the heart.  IMPRESSION: Shallow inspiration. Cardiac enlargement with mild vascular crowding. No evidence of active pulmonary disease. Hiatal hernia behind the heart.   Electronically Signed   By: Lucienne Capers M.D.   On: 12/15/2014 05:31    Scheduled Meds: . calcium-vitamin D  1 tablet Oral Q breakfast  . cholecalciferol  2,000 Units Oral BID  . citalopram  5 mg Oral QHS  . heparin  5,000 Units Subcutaneous 3 times per day  . levothyroxine  50 mcg Oral QAC breakfast  . loratadine  10 mg Oral Daily  . multivitamin with minerals  1 tablet Oral Daily  . pantoprazole  80 mg Oral Daily  . terazosin  10 mg Oral Daily  . triamcinolone cream  1 application Topical TID  . vancomycin  1,250 mg Intravenous Q24H   Continuous Infusions: . sodium chloride 100 mL/hr at 12/15/14 1622    Principal Problem:   Cellulitis of left ankle Active Problems:   Ulcerated varicose veins of leg   Essential hypertension   Cellulitis of right ankle   HLD (hyperlipidemia)   Other emphysema   Other specified hypothyroidism   Hiatal hernia    Time spent: [place], [place], [place]  [facility] Pager [phone]. If 7PM-7AM, please contact night-coverage at [email or web address], password  [facility] 12/16/2014, 10:25 AM  LOS: 1 day

## 2014-12-17 LAB — CBC WITH DIFFERENTIAL/PLATELET
Basophils Absolute: 0 10*3/uL (ref 0.0–0.1)
Basophils Relative: 0 % (ref 0–1)
Eosinophils Absolute: 0.1 10*3/uL (ref 0.0–0.7)
Eosinophils Relative: 1 % (ref 0–5)
HCT: 33 % — ABNORMAL LOW (ref 36.0–46.0)
Hemoglobin: 11.1 g/dL — ABNORMAL LOW (ref 12.0–15.0)
Lymphocytes Relative: 18 % (ref 12–46)
Lymphs Abs: 1.2 10*3/uL (ref 0.7–4.0)
MCH: 29.5 pg (ref 26.0–34.0)
MCHC: 33.6 g/dL (ref 30.0–36.0)
MCV: 87.8 fL (ref 78.0–100.0)
Monocytes Absolute: 0.7 10*3/uL (ref 0.1–1.0)
Monocytes Relative: 11 % (ref 3–12)
Neutro Abs: 4.6 10*3/uL (ref 1.7–7.7)
Neutrophils Relative %: 70 % (ref 43–77)
Platelets: 120 10*3/uL — ABNORMAL LOW (ref 150–400)
RBC: 3.76 MIL/uL — ABNORMAL LOW (ref 3.87–5.11)
RDW: 14 % (ref 11.5–15.5)
WBC: 6.6 10*3/uL (ref 4.0–10.5)

## 2014-12-17 LAB — COMPREHENSIVE METABOLIC PANEL
ALT: 17 U/L (ref 0–35)
AST: 31 U/L (ref 0–37)
Albumin: 2.9 g/dL — ABNORMAL LOW (ref 3.5–5.2)
Alkaline Phosphatase: 67 U/L (ref 39–117)
Anion gap: 5 (ref 5–15)
BUN: 8 mg/dL (ref 6–23)
CO2: 24 mmol/L (ref 19–32)
Calcium: 8.3 mg/dL — ABNORMAL LOW (ref 8.4–10.5)
Chloride: 106 mEq/L (ref 96–112)
Creatinine, Ser: 0.88 mg/dL (ref 0.50–1.10)
GFR calc Af Amer: 68 mL/min — ABNORMAL LOW (ref >90)
GFR calc non Af Amer: 59 mL/min — ABNORMAL LOW (ref >90)
Glucose, Bld: 88 mg/dL (ref 70–99)
Potassium: 3.5 mmol/L (ref 3.5–5.1)
Sodium: 135 mmol/L (ref 135–145)
Total Bilirubin: 1 mg/dL (ref 0.3–1.2)
Total Protein: 6.2 g/dL (ref 6.0–8.3)

## 2014-12-17 LAB — MAGNESIUM: Magnesium: 1.7 mg/dL (ref 1.5–2.5)

## 2014-12-17 MED ORDER — TRIAMCINOLONE ACETONIDE 0.1 % EX CREA
1.0000 "application " | TOPICAL_CREAM | Freq: Two times a day (BID) | CUTANEOUS | Status: DC
  Administered 2014-12-17 – 2014-12-21 (×8): 1 via TOPICAL
  Filled 2014-12-17 (×3): qty 15

## 2014-12-17 MED ORDER — VANCOMYCIN HCL 10 G IV SOLR
1250.0000 mg | INTRAVENOUS | Status: DC
  Administered 2014-12-17 – 2014-12-19 (×3): 1250 mg via INTRAVENOUS
  Filled 2014-12-17 (×3): qty 1250

## 2014-12-17 MED ORDER — SODIUM CHLORIDE 0.9 % IJ SOLN
10.0000 mL | INTRAMUSCULAR | Status: DC | PRN
  Administered 2014-12-18 – 2014-12-21 (×4): 10 mL
  Filled 2014-12-17 (×4): qty 40

## 2014-12-17 MED ORDER — MAGNESIUM OXIDE 400 (241.3 MG) MG PO TABS
400.0000 mg | ORAL_TABLET | Freq: Every day | ORAL | Status: DC
  Administered 2014-12-17 – 2014-12-18 (×2): 400 mg via ORAL
  Filled 2014-12-17 (×2): qty 1

## 2014-12-17 MED ORDER — METHOCARBAMOL 500 MG PO TABS
250.0000 mg | ORAL_TABLET | Freq: Four times a day (QID) | ORAL | Status: DC | PRN
  Administered 2014-12-17 – 2014-12-18 (×3): 250 mg via ORAL
  Filled 2014-12-17 (×4): qty 1

## 2014-12-17 MED ORDER — CIPROFLOXACIN IN D5W 400 MG/200ML IV SOLN
400.0000 mg | Freq: Two times a day (BID) | INTRAVENOUS | Status: DC
  Administered 2014-12-17 – 2014-12-18 (×2): 400 mg via INTRAVENOUS
  Filled 2014-12-17 (×3): qty 200

## 2014-12-17 NOTE — Progress Notes (Signed)
Peripherally Inserted Central Catheter/Midline Placement  The IV Nurse has discussed with the patient and/or persons authorized to consent for the patient, the purpose of this procedure and the potential benefits and risks involved with this procedure.  The benefits include less needle sticks, lab draws from the catheter and patient may be discharged home with the catheter.  Risks include, but not limited to, infection, bleeding, blood clot (thrombus formation), and puncture of an artery; nerve damage and irregular heat beat.  Alternatives to this procedure were also discussed.  PICC/Midline Placement Documentation  PICC / Midline Single Lumen 23/95/32 PICC Right Basilic 38 cm 1 cm (Active)  Indication for Insertion or Continuance of Line Home intravenous therapies (PICC only);Prolonged intravenous therapies 12/17/2014 11:00 PM  Exposed Catheter (cm) 1 cm 12/17/2014 11:00 PM  Site Assessment Clean;Dry;Intact 12/17/2014 11:00 PM  Dressing Change Due 12/24/14 12/17/2014 11:00 PM       Damoni Causby, Maricela Bo 12/17/2014, 11:06 PM

## 2014-12-17 NOTE — Consult Note (Addendum)
WOC wound consult note Reason for Consult: Consult requested to assess left leg.  Pt states she had edema and weeping prior to admission and has been on antibiotics which have greatly improved left leg.  She was using Kenalog cream to left leg prior to admission. Wound type: Left calf without further edema.  No open wound, odor or drainage at this time.  Chronic hemosiderin staining; appearance consistent with venous stasis changes. Dressing procedure/placement/frequency: Kenalog cream has already been ordered by primary team; continue this plan of care.  No further topical treatment needed at this time.  Discussed with pt and she verbalizes understanding. Please re-consult if further assistance is needed.  Thank-you,  Julien Girt MSN, [place], [place], [place], [place]

## 2014-12-17 NOTE — Progress Notes (Addendum)
[facility] PROGRESS NOTE  [name] VQQ:[id number] DOB: 1928/01/12 DOA: 12/15/2014 PCP: Alesia Richards, MD    Assessment/Plan: Cellulitis of bilateral ankle Lt > Rt Discontinue clindamycin as the patient still has a significant amount of cellulitis and erythema Start the patient on vancomycin and ciprofloxacin -Continue current skin care -No compression hose Start low-dose Robaxin for muscle spasms in the left lower extremity Ultrasound negative for DVT  Ulcerated varicose veins of leg -Stable currently don't appear to be contributed to present issues.  HTN -Terazosin 10 mg daily will currently hold, allow progressive HTN and an 81 year old (studies have shown reduce risk of falls)  HLD -lipid panel within NCEP guidelines  COPD -Stable currently not on medication  Hypothyroidism -Continue Synthroid 50 g daily -TSH within normal limits  Hiatal hernia -Will require GI consult as outpatient   Code Status: Full Family Communication: Daughter present Disposition Plan: PT/OT eval    Consultants: NA  Procedures: 1/9 PCXR;- Cardiac enlargement with mild vascular crowding. -No evidence of active pulmonary disease.  -Hiatal hernia behind the heart.  Cultures 1/9 urine pending 1/9 blood pending  Antibiotics: Vancomycin 1/9>> stopped 1/10 Clindamycin 1/10>>   HPI/Subjective: Alyssa Schmitt is a 81 y.o. WF PMHx depression, anxiety, LBBB, HTN, HLD, thyroid disease, COPD, prediabetes, venous insufficiency Presented to the ED with c/o fever, chills, rigors, body aches, pain in her LLE ankle which has had a venous stasis ulcer for teh past 2.5 weeks. Patient had been putting triamcinalone on BLE recently, and developed abrupt onset of fever and significant increase of pain at the site of ulcer over night this past evening. Fever is 102.3 in the ED. No cough, no SOB, no abd pain, no diarrhea, no dysuria.   1/9 per patient lower extremity signs and  symptoms began around Christmas and progressively worsened. Was seen by PCP and treated with doxycycline + triamcinolone cream without resolution. Yesterday patient's bilateral lower extremity became extremely painful, erythematous, positive fever measured at 102F. 1/10 patient sitting comfortably in chair with decreased lower extremity pain.  Subjective Patient complaining of spasms in her left lower extremity, patient still has significant erythema and pain in the left lower extremity, difficulty with weightbearing   Objective: Filed Vitals:   12/16/14 1300 12/16/14 2110 12/16/14 2305 12/17/14 0544  BP: 135/56 157/73  163/70  Pulse: 74 76  68  Temp: 99.3 F (37.4 C) 99.5 F (37.5 C) 99.9 F (37.7 C) 98.1 F (36.7 C)  TempSrc: Oral Oral Oral Oral  Resp: 18 16  18   Height:      Weight:      SpO2: 93% 91%  92%    Intake/Output Summary (Last 24 hours) at 12/17/14 1250 Last data filed at 12/17/14 1006  Gross per 24 hour  Intake    360 ml  Output      0 ml  Net    360 ml   Filed Weights   12/15/14 0216  Weight: 80.74 kg (178 lb)     Exam: General: A/O 4, NAD, No acute respiratory distress Lungs: Clear to auscultation bilaterally without wheezes or crackles Cardiovascular: Regular rate and rhythm positive grade 3/6 systolic murmur (previously evaluated per patient), negative gallop or rub normal S1 and S2 Abdomen: Nontender, nondistended, soft, bowel sounds positive, no rebound, no ascites, no appreciable mass Extremities: No significant cyanosis, clubbing, bilateral lower extremities 1-2+ pedal edema, erythema, warm to touch, painful to palpation, Lt > Rt; improved from 1/9 exam   Data Reviewed: Basic Metabolic  Panel:  Recent Labs Lab 12/15/14 0408 12/16/14 0435 12/17/14 0705  NA 133* 134* 135  K 3.9 3.7 3.5  CL 99 102 106  CO2 26 26 24   GLUCOSE 117* 95 88  BUN 18 10 8   CREATININE 1.01 0.86 0.88  CALCIUM 9.1 8.4 8.3*  MG  --  1.7 1.7   Liver Function  Tests:  Recent Labs Lab 12/16/14 0435 12/17/14 0705  AST 26 31  ALT 17 17  ALKPHOS 65 67  BILITOT 1.0 1.0  PROT 5.9* 6.2  ALBUMIN 2.9* 2.9*   No results for input(s): LIPASE, AMYLASE in the last 168 hours. No results for input(s): AMMONIA in the last 168 hours. CBC:  Recent Labs Lab 12/15/14 0408 12/16/14 0435 12/17/14 0705  WBC 10.5 9.8 6.6  NEUTROABS 8.9* 7.0 4.6  HGB 12.4 10.7* 11.1*  HCT 36.9 32.4* 33.0*  MCV 88.1 87.8 87.8  PLT 154 131* 120*   Cardiac Enzymes: No results for input(s): CKTOTAL, CKMB, CKMBINDEX, TROPONINI in the last 168 hours. BNP (last 3 results) No results for input(s): PROBNP in the last 8760 hours. CBG: No results for input(s): GLUCAP in the last 168 hours.  Recent Results (from the past 240 hour(s))  Blood culture (routine x 2)     Status: None (Preliminary result)   Collection Time: 12/15/14  2:50 AM  Result Value Ref Range Status   Specimen Description BLOOD RIGHT FOREARM  Final   Special Requests BOTTLES DRAWN AEROBIC AND ANAEROBIC 10CC   Final   Culture   Final           BLOOD CULTURE RECEIVED NO GROWTH TO DATE CULTURE WILL BE HELD FOR 5 DAYS BEFORE ISSUING A FINAL NEGATIVE REPORT Performed at [facility]    Report Status PENDING  Incomplete  Blood culture (routine x 2)     Status: None (Preliminary result)   Collection Time: 12/15/14  4:11 AM  Result Value Ref Range Status   Specimen Description BLOOD RIGHT HAND  Final   Special Requests BOTTLES DRAWN AEROBIC ONLY [place]  Final   Culture   Final           BLOOD CULTURE RECEIVED NO GROWTH TO DATE CULTURE WILL BE HELD FOR 5 DAYS BEFORE ISSUING A FINAL NEGATIVE REPORT Performed at [facility]    Report Status PENDING  Incomplete  Urine culture     Status: None   Collection Time: 12/15/14  5:57 AM  Result Value Ref Range Status   Specimen Description URINE, RANDOM  Final   Special Requests NONE  Final   Colony Count   Final    3,000 COLONIES/ML Performed at  [facility]    Culture   Final    INSIGNIFICANT GROWTH Performed at [facility]    Report Status 12/16/2014 FINAL  Final     Studies: No results found.  Scheduled Meds: . bisacodyl  5 mg Oral BID  . calcium-vitamin D  1 tablet Oral Q breakfast  . cholecalciferol  2,000 Units Oral BID  . citalopram  5 mg Oral QHS  . clindamycin  300 mg Oral 4 times per day  . heparin  5,000 Units Subcutaneous 3 times per day  . levothyroxine  50 mcg Oral QAC breakfast  . loratadine  10 mg Oral Daily  . multivitamin with minerals  1 tablet Oral Daily  . pantoprazole  80 mg Oral Daily  . terazosin  10 mg Oral Daily  . triamcinolone  cream  1 application Topical BID   Continuous Infusions: . sodium chloride 100 mL/hr at 12/17/14 1771    Principal Problem:   Cellulitis of left ankle Active Problems:   Ulcerated varicose veins of leg   Essential hypertension   Cellulitis of right ankle   HLD (hyperlipidemia)   Other emphysema   Other specified hypothyroidism   Hiatal hernia    Time spent: [place] Pager [phone]. If 7PM-7AM, please contact night-coverage at [email or web address], password [facility] 12/17/2014, 12:50 PM  LOS: 2 days

## 2014-12-17 NOTE — Progress Notes (Signed)
ANTIBIOTIC CONSULT NOTE - Restarting Vancomycin Pharmacy Consult for Vancomycin  Indication: Cellulitis  Allergies  Allergen Reactions  . Aspirin Swelling  . Celebrex [Celecoxib] Other (See Comments)    "my body lit up"  . Fosamax [Alendronate Sodium] Other (See Comments)    unknown  . Keflex [Cephalexin] Other (See Comments)    unknown  . Penicillins Other (See Comments)    unknown  . Prednisone Itching  . Prevacid [Lansoprazole] Other (See Comments)    unknown  . Sulfa Antibiotics Other (See Comments)    unknown  . Tetanus Toxoids Other (See Comments)    unknown  . Tetracyclines & Related Nausea Only    Nausea     Patient Measurements: Height: 5\' 3"  (160 cm) Weight: 178 lb (80.74 kg) IBW/kg (Calculated) : 52.4 Vital Signs: Temp: 98.1 F (36.7 C) (01/11 0544) Temp Source: Oral (01/11 0544) BP: 163/70 mmHg (01/11 0544) Pulse Rate: 68 (01/11 0544)  Labs:  Recent Labs  12/15/14 0408 12/16/14 0435 12/17/14 0705  WBC 10.5 9.8 6.6  HGB 12.4 10.7* 11.1*  PLT 154 131* 120*  CREATININE 1.01 0.86 0.88   Estimated Creatinine Clearance: 47.9 mL/min (by C-G formula based on Cr of 0.88).  Medical History: Past Medical History  Diagnosis Date  . Abnormal EKG     LBBB  . H/O echocardiogram   . Ulcer   . Allergy   . Arthritis   . Hypertension   . Thyroid disease   . Hyperlipidemia   . GERD (gastroesophageal reflux disease)   . Anxiety   . Depression   . COPD (chronic obstructive pulmonary disease)   . Osteoporosis   . Prediabetes   . Venous insufficiency   . Vitamin D deficiency     Assessment: 81 y/o F with bilateral lower extremity cellulitis.  Received vancomycin 1250 mg IV q24h 1/9- 1/10 then was discontinued and changed to oral clindamycin. Dr. Allyson Sabal notes today that patient still has a significant amount of cellulitis and erythema.  Now to restart IV vancomycin and started on Cipro IV.  Clindamycin discontinued.  Afebrile, WBC WNL, renal function  appears ok for age SCR stable at 0.88, other labs as above.   1/9 Blood Cx  x2 NGTD 1/9 Urine Cx  Negative  Goal of Therapy:  Vancomycin trough level 10-15 mcg/ml  Plan:  Restart Vancomycin 1250 mg IV q24h Trend WBC, temp, renal function  Drug levels as indicated   Thank you for allowing pharmacy to be part of this patients care team. Nicole Cella, RPh Clinical Pharmacist Pager: [phone] 12/17/2014,1:39 PM

## 2014-12-17 NOTE — Evaluation (Signed)
Physical Therapy Evaluation Patient Details Name: Alyssa Schmitt MRN: [id number] DOB: 26-Jun-1928 Today's Date: 12/17/2014   History of Present Illness  Patient is a 81 y/o female who presents to the ED with c/o fever, chills, rigors, body aches and pain in her LLE ankle which has had a venous stasis ulcer for the past 2.5 weeks. Patient had been putting triamcinalone on BLE recently, and developed abrupt onset of fever and significant increase of pain at the site of ulcer over night this past evening.  Fever is 102.3 in the ED.     Clinical Impression  Patient presents with functional limitations due to deficits listed in PT problem list (see below). Pt with increased pain (esp in dependent position and with WB) and generalized weakness limiting safe mobility. Pain seems to be pt's biggest limiting factor. Pt highly motivated to return to PLOF. Able to stand with Max A of 1. Pt would benefit from skilled PT and ST SNF to improve transfers, gait, ambulation and mobility so pt can maximize independence and return to PLOF.    Follow Up Recommendations SNF;Supervision/Assistance - 24 hour    Equipment Recommendations  Other (comment) (TBD)    Recommendations for Other Services       Precautions / Restrictions Precautions Precautions: Fall Restrictions Weight Bearing Restrictions: No      Mobility  Bed Mobility               General bed mobility comments: Sitting in chair upon PT arrival.   Transfers Overall transfer level: Needs assistance Equipment used:  (bed rail.) Transfers: Sit to/from Stand Sit to Stand: Max assist         General transfer comment: Multiple attempts to stand pulling up on bed rail, unsuccessful. Able to stand pushing off from chair with cues for anterior translation ad Max A for hip extension. Increased pain through LLE>RLE.  Ambulation/Gait Ambulation/Gait assistance:  (not assessed.)              Stairs            Wheelchair  Mobility    Modified Rankin (Stroke Patients Only)       Balance Overall balance assessment: Needs assistance Sitting-balance support: Feet supported;No upper extremity supported Sitting balance-Leahy Scale: Fair Sitting balance - Comments: Increased pain with BLEs in dependent position.   Standing balance support: During functional activity Standing balance-Leahy Scale: Zero Standing balance comment: Only able to stand ~10 sec with BUE support and Max A for balance.                             Pertinent Vitals/Pain Pain Assessment: Faces Faces Pain Scale: Hurts whole lot Pain Location: BLEs distal to knee, LLE>RLE Pain Descriptors / Indicators: Sharp;Sore Pain Intervention(s): Limited activity within patient's tolerance;Monitored during session;Premedicated before session;Repositioned    Home Living Family/patient expects to be discharged to:: Private residence Living Arrangements: Children Available Help at Discharge: Family;Available PRN/intermittently Type of Home: House Home Access: Stairs to enter Entrance Stairs-Rails: Left Entrance Stairs-Number of Steps: 2 Home Layout: One level Home Equipment: Walker - 4 wheels;Cane - single point;Bedside commode      Prior Function Level of Independence: Needs assistance   Gait / Transfers Assistance Needed: Uses SPC for ambulation. Sometimes no AD for household ambulation.  ADL's / Homemaking Assistance Needed: Assist for bathing, does dressing independently. No falls in last few years.        Hand Dominance  Extremity/Trunk Assessment   Upper Extremity Assessment: Defer to OT evaluation           Lower Extremity Assessment: Generalized weakness;RLE deficits/detail;LLE deficits/detail RLE Deficits / Details: Decreased AROM ankle DF/PF secondary to pain. Grossly ~3/5 knee extension, hip flexion. LLE Deficits / Details: Decreased AROM ankle DF/PF secondary to pain. Grossly ~3/5 knee extension,  hip flexion.     Communication   Communication: No difficulties  Cognition Arousal/Alertness: Awake/alert Behavior During Therapy: WFL for tasks assessed/performed Overall Cognitive Status: Within Functional Limits for tasks assessed                      General Comments General comments (skin integrity, edema, etc.): Discoloration of BLEs distal to knee and into foot. Warmth, erythema and pain to palpation. Pt emotional and tearful during session due to being in hospital and missing hospital. Therapist listened and consoled patient.    Exercises General Exercises - Lower Extremity Ankle Circles/Pumps: Both;5 reps;Seated Long Arc Quad: Both;5 reps;Seated Hip Flexion/Marching: Both;10 reps;Seated      Assessment/Plan    PT Assessment Patient needs continued PT services  PT Diagnosis Difficulty walking;Generalized weakness;Acute pain   PT Problem List Decreased strength;Pain;Decreased activity tolerance;Decreased balance;Decreased mobility;Impaired sensation;Decreased range of motion  PT Treatment Interventions Balance training;Gait training;Functional mobility training;Patient/family education;Therapeutic activities;Therapeutic exercise;Neuromuscular re-education;DME instruction   PT Goals (Current goals can be found in the Care Plan section) Acute Rehab PT Goals Patient Stated Goal: to make this pain go away PT Goal Formulation: With patient Time For Goal Achievement: 12/31/14 Potential to Achieve Goals: Fair    Frequency Min 3X/week   Barriers to discharge Decreased caregiver support Pt home alone during the day    Co-evaluation               End of Session Equipment Utilized During Treatment: Gait belt Activity Tolerance: Patient limited by pain Patient left: in chair;with call bell/phone within reach;with family/visitor present Nurse Communication: Mobility status         Time: 3094-0768 PT Time Calculation (min) (ACUTE ONLY): 41 min   Charges:    PT Evaluation $Initial PT Evaluation Tier I: 1 Procedure PT Treatments $Therapeutic Exercise: 8-22 mins $Therapeutic Activity: 8-22 mins   PT G CodesCandy Sledge A 2015-01-07, 5:31 PM  Candy Sledge, [place], DPT [phone]

## 2014-12-18 LAB — COMPREHENSIVE METABOLIC PANEL
ALT: 17 U/L (ref 0–35)
AST: 28 U/L (ref 0–37)
Albumin: 2.8 g/dL — ABNORMAL LOW (ref 3.5–5.2)
Alkaline Phosphatase: 64 U/L (ref 39–117)
Anion gap: 9 (ref 5–15)
BUN: <5 mg/dL — ABNORMAL LOW (ref 6–23)
CO2: 22 mmol/L (ref 19–32)
Calcium: 8.3 mg/dL — ABNORMAL LOW (ref 8.4–10.5)
Chloride: 101 mEq/L (ref 96–112)
Creatinine, Ser: 0.68 mg/dL (ref 0.50–1.10)
GFR calc Af Amer: >90 mL/min (ref >90)
GFR calc non Af Amer: 78 mL/min — ABNORMAL LOW (ref >90)
Glucose, Bld: 84 mg/dL (ref 70–99)
Potassium: 3.5 mmol/L (ref 3.5–5.1)
Sodium: 132 mmol/L — ABNORMAL LOW (ref 135–145)
Total Bilirubin: 1.3 mg/dL — ABNORMAL HIGH (ref 0.3–1.2)
Total Protein: 6.3 g/dL (ref 6.0–8.3)

## 2014-12-18 LAB — CBC WITH DIFFERENTIAL/PLATELET
Basophils Absolute: 0 10*3/uL (ref 0.0–0.1)
Basophils Relative: 0 % (ref 0–1)
Eosinophils Absolute: 0.2 10*3/uL (ref 0.0–0.7)
Eosinophils Relative: 4 % (ref 0–5)
HCT: 31.7 % — ABNORMAL LOW (ref 36.0–46.0)
Hemoglobin: 10.7 g/dL — ABNORMAL LOW (ref 12.0–15.0)
Lymphocytes Relative: 24 % (ref 12–46)
Lymphs Abs: 1.5 10*3/uL (ref 0.7–4.0)
MCH: 30 pg (ref 26.0–34.0)
MCHC: 33.8 g/dL (ref 30.0–36.0)
MCV: 88.8 fL (ref 78.0–100.0)
Monocytes Absolute: 0.8 10*3/uL (ref 0.1–1.0)
Monocytes Relative: 13 % — ABNORMAL HIGH (ref 3–12)
Neutro Abs: 3.6 10*3/uL (ref 1.7–7.7)
Neutrophils Relative %: 59 % (ref 43–77)
Platelets: 125 10*3/uL — ABNORMAL LOW (ref 150–400)
RBC: 3.57 MIL/uL — ABNORMAL LOW (ref 3.87–5.11)
RDW: 13.9 % (ref 11.5–15.5)
WBC: 6 10*3/uL (ref 4.0–10.5)

## 2014-12-18 LAB — MAGNESIUM: Magnesium: 1.6 mg/dL (ref 1.5–2.5)

## 2014-12-18 MED ORDER — MAGNESIUM OXIDE 400 (241.3 MG) MG PO TABS
400.0000 mg | ORAL_TABLET | Freq: Two times a day (BID) | ORAL | Status: DC
  Administered 2014-12-18 – 2014-12-21 (×6): 400 mg via ORAL
  Filled 2014-12-18 (×7): qty 1

## 2014-12-18 MED ORDER — SACCHAROMYCES BOULARDII 250 MG PO CAPS
250.0000 mg | ORAL_CAPSULE | Freq: Two times a day (BID) | ORAL | Status: DC
  Administered 2014-12-18 – 2014-12-21 (×7): 250 mg via ORAL
  Filled 2014-12-18 (×8): qty 1

## 2014-12-18 MED ORDER — FENTANYL CITRATE 0.05 MG/ML IJ SOLN
12.5000 ug | INTRAMUSCULAR | Status: DC | PRN
  Administered 2014-12-18: 12.5 ug via INTRAVENOUS
  Filled 2014-12-18: qty 2

## 2014-12-18 NOTE — Progress Notes (Signed)
PT Cancellation Note  Patient Details Name: Alyssa Schmitt MRN: [id number] DOB: 25-Oct-1928   Cancelled Treatment:    Reason Eval/Treat Not Completed: Other (comment) (OT working with pt, PT will try back later as time allows. ) Thanks,   Wells Guiles B. Johnsie Moscoso, PT, DPT [phone]   12/18/2014, 1:50 PM

## 2014-12-18 NOTE — Evaluation (Signed)
Occupational Therapy Evaluation Patient Details Name: Alyssa Schmitt MRN: [id number] DOB: 1928-03-03 Today's Date: 12/18/2014    History of Present Illness Patient is a 81 y/o female who presents to the ED with c/o fever, chills, rigors, body aches and pain in her LLE ankle which has had a venous stasis ulcer for the past 2.5 weeks. Patient had been putting triamcinalone on BLE recently, and developed abrupt onset of fever and significant increase of pain at the site of ulcer over night this past evening.  Fever is 102.3 in the ED.    Clinical Impression   Pt admitted with the above diagnoses and presents with below problem list. Pt will benefit from continued acute OT to address the below listed deficits and maximize independence with basic ADLs prior to d/c to next venue. PTA pt mod I with most ADLs; min assistance with bathing as needed. Pt currently at +2 max phy A for LB ADLs. Discussed SNF recommendation with pt and daughter. Pt listened to therapist recommendation indicated she is hopeful to return home without SNF becoming tearful during discussion. OT to continue to follow.     Follow Up Recommendations  SNF    Equipment Recommendations  Other (comment) (defer to next venue)    Recommendations for Other Services       Precautions / Restrictions Precautions Precautions: Fall Precaution Comments:  h/o falls Restrictions Weight Bearing Restrictions: No      Mobility Bed Mobility               General bed mobility comments: pt in recliner  Transfers Overall transfer level: Needs assistance Equipment used: Rolling walker (2 wheeled) Transfers: Sit to/from Stand Sit to Stand: Max assist;+2 physical assistance         General transfer comment: attempted sit>stand 2x. Pt only came to partial stand first attempt. Second attempt pt came up almost to full stand and held this positio for 30 seconds. Increased BLE pain in standing.    Balance Overall balance  assessment: Needs assistance         Standing balance support: Bilateral upper extremity supported;During functional activity Standing balance-Leahy Scale: Zero Standing balance comment: unable to come to full stand with +2 max physical A                            ADL Overall ADL's : Needs assistance/impaired Eating/Feeding: Set up;Sitting   Grooming: Set up;Sitting   Upper Body Bathing: Set up;Sitting   Lower Body Bathing: +2 for physical assistance;Sit to/from stand;Maximal assistance   Upper Body Dressing : Set up;Sitting   Lower Body Dressing: Maximal assistance;+2 for physical assistance;Sit to/from stand   Toilet Transfer: +2 for physical assistance;Maximal assistance;Squat-pivot;BSC;RW   Toileting- Clothing Manipulation and Hygiene: Maximal assistance;+2 for physical assistance;Sit to/from stand   Tub/ Shower Transfer: Maximal assistance;+2 for physical assistance;Stand-pivot;3 in 1;Rolling walker   Functional mobility during ADLs: Maximal assistance;+2 for physical assistance;Rolling walker General ADL Comments: Pt at +2 max A level for LB ADLs. Discussed SNF recommendation with pt and daughter.     Vision                     Perception     Praxis      Pertinent Vitals/Pain Pain Assessment: Faces Pain Score: 8  Faces Pain Scale: Hurts whole lot Pain Location: BLEs distal to knee, LLE>RLE Pain Descriptors / Indicators: Sharp;Sore Pain Intervention(s): Limited activity within patient's tolerance;Patient  requesting pain meds-RN notified;Monitored during session;Repositioned;Utilized relaxation techniques     Hand Dominance     Extremity/Trunk Assessment Upper Extremity Assessment Upper Extremity Assessment: Generalized weakness   Lower Extremity Assessment Lower Extremity Assessment: Defer to PT evaluation       Communication Communication Communication: No difficulties   Cognition Arousal/Alertness: Awake/alert Behavior During  Therapy: Anxious Overall Cognitive Status: Within Functional Limits for tasks assessed                     General Comments       Exercises       Shoulder Instructions      Home Living Family/patient expects to be discharged to:: Private residence Living Arrangements: Children Available Help at Discharge: Family;Available PRN/intermittently Type of Home: House Home Access: Stairs to enter [employer] of Steps: 2 Entrance Stairs-Rails: Left Home Layout: One level     Bathroom Shower/Tub: Teacher, early years/pre: Handicapped height     Home Equipment: Environmental consultant - 4 wheels;Cane - single point;Bedside commode   Additional Comments: pt lives with daughter; daughter works       Prior Functioning/Environment Level of Independence: Needs Product/process development scientist / Transfers Assistance Needed: Uses SPC for ambulation. Sometimes no AD for household ambulation. ADL's / Homemaking Assistance Needed: Assist for bathing, does dressing independently. No falls in last few years.        OT Diagnosis: Generalized weakness;Acute pain   OT Problem List: Decreased strength;Decreased range of motion;Decreased activity tolerance;Impaired balance (sitting and/or standing);Decreased knowledge of use of DME or AE;Decreased knowledge of precautions;Pain   OT Treatment/Interventions: Self-care/ADL training;DME and/or AE instruction;Therapeutic activities;Patient/family education;Balance training    OT Goals(Current goals can be found in the care plan section) Acute Rehab OT Goals OT Goal Formulation: With patient/family Time For Goal Achievement: 12/25/14 Potential to Achieve Goals: Fair ADL Goals Pt Will Perform Lower Body Dressing: with min assist;with adaptive equipment;sit to/from stand Pt Will Transfer to Toilet: with min guard assist;ambulating;bedside commode Pt Will Perform Toileting - Clothing Manipulation and hygiene: with min guard assist;sit to/from stand  OT  Frequency: Min 3X/week   Barriers to D/C: Other (comment)  pt lives with daughter who works; needed +2 phy A this session       Co-evaluation              End of Session Equipment Utilized During Treatment: Administrator, arts Communication: Mobility status  Activity Tolerance: Patient limited by pain;Patient limited by fatigue Patient left: in chair;with call bell/phone within reach;with family/visitor present   Time: 1315-1345 OT Time Calculation (min): 30 min Charges:  OT General Charges $OT Visit: 1 Procedure OT Evaluation $Initial OT Evaluation Tier I: 1 Procedure OT Treatments $Self Care/Home Management : 8-22 mins G-Codes:    Hortencia Pilar January 03, 2015, 2:05 PM

## 2014-12-18 NOTE — Progress Notes (Signed)
Thank you for consult on Alyssa Schmitt. Chart reviewed and patient examined.  She was sedentary PTA and expressed concerns about ability to tolerate 3 hours/day of therapy. She is currently limited by significant pain as well as anxiety. Concur with PT recommendations for SNF level therapy past discharge.  Will defer CIR consult for now.

## 2014-12-18 NOTE — Clinical Social Work Placement (Addendum)
Clinical Social Work Department CLINICAL SOCIAL WORK PLACEMENT NOTE 12/18/2014  Patient:  Alyssa Schmitt, Alyssa Schmitt  Account Number:  [id number] Admit date:  12/15/2014  Clinical Social Worker:  Delrae Sawyers  Date/time:  12/18/2014 03:14 PM  Clinical Social Work is seeking post-discharge placement for this patient at the following level of care:   [place]   (*CSW will update this form in Epic as items are completed)   12/18/2014  Patient/family provided with [place] Department of Clinical Social Work's list of facilities offering this level of care within the geographic area requested by the patient (or if unable, by the patient's family).  12/18/2014  Patient/family informed of their freedom to choose among providers that offer the needed level of care, that participate in Medicare, Medicaid or managed care program needed by the patient, have an available bed and are willing to accept the patient.  12/18/2014  Patient/family informed of [facility]' ownership interest in [facility], as well as of the fact that they are under no obligation to receive care at this facility.  PASARR submitted to EDS on 12/18/2014 PASARR number received on 12/18/2014  FL2 transmitted to all facilities in geographic area requested by pt/family on  12/18/2014 FL2 transmitted to all facilities within larger geographic area on   Patient informed that his/her managed care company has contracts with or will negotiate with  certain facilities, including the following:     Patient/family informed of bed offers received:  12/20/2014 Patient chooses bed at [facility] and [place] recommends and patient chooses bed at    Patient to be transferred to  [facility] on  12/21/2014 Patient to be transferred to facility by [facility] Patient and family notified of transfer on 12/21/2014 Name of family member notified:  Pt's daughter, [name], updated regarding  discharge.  The following physician request were entered in Epic:   Additional Comments:  [place] ([phone]) Licensed Clinical Social Worker Orthopedics [phone]) and Surgical [phone])

## 2014-12-18 NOTE — Clinical Social Work Psychosocial (Signed)
Clinical Social Work Department BRIEF PSYCHOSOCIAL ASSESSMENT 12/18/2014  Patient:  Alyssa Schmitt, Alyssa Schmitt     Account Number:  [id number]     Admit date:  12/15/2014  Clinical Social Worker:  Delrae Sawyers  Date/Time:  12/18/2014 02:58 PM  Referred by:  Physician  Date Referred:  12/18/2014 Referred for  SNF Placement   Other Referral:   none.   Interview type:  Family Other interview type:   CSW spoke with pt's daughter, Alyssa Schmitt.    PSYCHOSOCIAL DATA Living Status:  FAMILY Admitted from facility:   Level of care:   Primary support name:  Alyssa Schmitt Primary support relationship to patient:  CHILD, ADULT Degree of support available:   Strong support system.    CURRENT CONCERNS Current Concerns  Post-Acute Placement   Other Concerns:   none.    SOCIAL WORK ASSESSMENT / PLAN CSW met with pt and pt's daughter at bedside. Pt's daughter stated pt is from home with pt's other daughter, Alyssa Schmitt. Per pt's daughter, pt to return home with pt's daughter after completing short-term rehabilitation.    Pt's daughter confirmed pt and pt's family agreeable to SNF placement at time of discharge. Pt and pt's family stated preference for [place] SNF.    CSW to continue to follow and assist with discharge planning needs.   Assessment/plan status:  Psychosocial Support/Ongoing Assessment of Needs Other assessment/ plan:   none.   Information/referral to community resources:   [facility] bed offers.    PATIENT'S/FAMILY'S RESPONSE TO PLAN OF CARE: Pt and pt's family understanding and agreeable to CSW plan of care. Neither pt nor pt's family expressed any further questions or concerns at this time.       Alyssa Schmitt, [place] ([phone]) Licensed Clinical Social Worker Orthopedics [phone]) and Surgical [phone])

## 2014-12-18 NOTE — Progress Notes (Addendum)
[facility] PROGRESS NOTE  Alyssa Schmitt EAV:[id number] DOB: [birth year] DOA: 12/15/2014 PCP: Alesia Richards, MD      Assessment/Plan: Cellulitis of bilateral ankle Lt > Rt Discontinue clindamycin as the patient still has a significant amount of cellulitis and erythema Continue vancomycin , patient has a PICC line in place and may be able to continue with IV antibiotics at SNF for another week -Continue current skin care -No compression hose Continue  low-dose Robaxin for muscle spasms in the left lower extremity, IV fentanyl as pain is not controlled Ultrasound negative for DVT Discussed plan of care with daughter [name]  Ulcerated varicose veins of leg -Stable currently don't appear to be contributed to present issues.  HTN -Terazosin 10 mg daily will currently hold, allow progressive HTN and an 81 year old (studies have shown reduce risk of falls)  HLD -lipid panel within NCEP guidelines  COPD -Stable currently not on medication  Hypothyroidism -Continue Synthroid 50 g daily -TSH within normal limits  Hiatal hernia -Will require GI consult as outpatient   Code Status: Full Family Communication: Daughter present, discussed with [name] Disposition Plan: PT/OT eval    Consultants: NA  Procedures: 1/9 PCXR;- Cardiac enlargement with mild vascular crowding. -No evidence of active pulmonary disease.  -Hiatal hernia behind the heart.  Cultures 1/9 urine pending 1/9 blood pending  Antibiotics: Vancomycin 1/9>> stopped 1/10 Clindamycin 1/10>>   HPI/Subjective: Alyssa Schmitt is a 81 y.o. WF PMHx depression, anxiety, LBBB, HTN, HLD, thyroid disease, COPD, prediabetes, venous insufficiency Presented to the ED with c/o fever, chills, rigors, body aches, pain in her LLE ankle which has had a venous stasis ulcer for teh past 2.5 weeks. Patient had been putting triamcinalone on BLE recently, and developed abrupt onset of fever and significant increase  of pain at the site of ulcer over night this past evening. Fever is 102.3 in the ED. No cough, no SOB, no abd pain, no diarrhea, no dysuria.   1/9 per patient lower extremity signs and symptoms began around Christmas and progressively worsened. Was seen by PCP and treated with doxycycline + triamcinolone cream without resolution. Yesterday patient's bilateral lower extremity became extremely painful, erythematous, positive fever measured at 102F. 1/10 patient sitting comfortably in chair with decreased lower extremity pain.  Subjective Patient complaining of spasms in her left lower extremity, patient still has significant erythema and pain in the left lower extremity, difficulty with weightbearing   Objective: Filed Vitals:   12/17/14 0544 12/17/14 1709 12/17/14 2100 12/18/14 0556  BP: 163/70 127/64 151/67 158/71  Pulse: 68 67 73 75  Temp: 98.1 F (36.7 C) 98.2 F (36.8 C) 98.4 F (36.9 C) 98.5 F (36.9 C)  TempSrc: Oral  Oral Oral  Resp: 18 18 20 18   Height:      Weight:      SpO2: 92% 95% 96% 93%    Intake/Output Summary (Last 24 hours) at 12/18/14 1150 Last data filed at 12/17/14 1900  Gross per 24 hour  Intake    480 ml  Output      0 ml  Net    480 ml   Filed Weights   12/15/14 0216  Weight: 80.74 kg (178 lb)     Exam: General: A/O 4, NAD, No acute respiratory distress Lungs: Clear to auscultation bilaterally without wheezes or crackles Cardiovascular: Regular rate and rhythm positive grade 3/6 systolic murmur (previously evaluated per patient), negative gallop or rub normal S1 and S2 Abdomen: Nontender, nondistended, soft, bowel sounds positive, no  rebound, no ascites, no appreciable mass Extremities: No significant cyanosis, clubbing, bilateral lower extremities 1-2+ pedal edema, erythema, warm to touch, painful to palpation, Lt > Rt; improved from 1/9 exam   Data Reviewed: Basic Metabolic Panel:  Recent Labs Lab 12/15/14 0408 12/16/14 0435  12/17/14 0705 12/18/14 0505  NA 133* 134* 135 132*  K 3.9 3.7 3.5 3.5  CL 99 102 106 101  CO2 26 26 24 22   GLUCOSE 117* 95 88 84  BUN 18 10 8  <5*  CREATININE 1.01 0.86 0.88 0.68  CALCIUM 9.1 8.4 8.3* 8.3*  MG  --  1.7 1.7 1.6   Liver Function Tests:  Recent Labs Lab 12/16/14 0435 12/17/14 0705 12/18/14 0505  AST 26 31 28   ALT 17 17 17   ALKPHOS 65 67 64  BILITOT 1.0 1.0 1.3*  PROT 5.9* 6.2 6.3  ALBUMIN 2.9* 2.9* 2.8*   No results for input(s): LIPASE, AMYLASE in the last 168 hours. No results for input(s): AMMONIA in the last 168 hours. CBC:  Recent Labs Lab 12/15/14 0408 12/16/14 0435 12/17/14 0705 12/18/14 0505  WBC 10.5 9.8 6.6 6.0  NEUTROABS 8.9* 7.0 4.6 3.6  HGB 12.4 10.7* 11.1* 10.7*  HCT 36.9 32.4* 33.0* 31.7*  MCV 88.1 87.8 87.8 88.8  PLT 154 131* 120* 125*   Cardiac Enzymes: No results for input(s): CKTOTAL, CKMB, CKMBINDEX, TROPONINI in the last 168 hours. BNP (last 3 results) No results for input(s): PROBNP in the last 8760 hours. CBG: No results for input(s): GLUCAP in the last 168 hours.  Recent Results (from the past 240 hour(s))  Blood culture (routine x 2)     Status: None (Preliminary result)   Collection Time: 12/15/14  2:50 AM  Result Value Ref Range Status   Specimen Description BLOOD RIGHT FOREARM  Final   Special Requests BOTTLES DRAWN AEROBIC AND ANAEROBIC 10CC   Final   Culture   Final           BLOOD CULTURE RECEIVED NO GROWTH TO DATE CULTURE WILL BE HELD FOR 5 DAYS BEFORE ISSUING A FINAL NEGATIVE REPORT Performed at [facility]    Report Status PENDING  Incomplete  Blood culture (routine x 2)     Status: None (Preliminary result)   Collection Time: 12/15/14  4:11 AM  Result Value Ref Range Status   Specimen Description BLOOD RIGHT HAND  Final   Special Requests BOTTLES DRAWN AEROBIC ONLY [place]  Final   Culture   Final           BLOOD CULTURE RECEIVED NO GROWTH TO DATE CULTURE WILL BE HELD FOR 5 DAYS BEFORE ISSUING A  FINAL NEGATIVE REPORT Performed at [facility]    Report Status PENDING  Incomplete  Urine culture     Status: None   Collection Time: 12/15/14  5:57 AM  Result Value Ref Range Status   Specimen Description URINE, RANDOM  Final   Special Requests NONE  Final   Colony Count   Final    3,000 COLONIES/ML Performed at [facility]    Culture   Final    INSIGNIFICANT GROWTH Performed at [facility]    Report Status 12/16/2014 FINAL  Final     Studies: No results found.  Scheduled Meds: . bisacodyl  5 mg Oral BID  . calcium-vitamin D  1 tablet Oral Q breakfast  . cholecalciferol  2,000 Units Oral BID  . citalopram  5 mg Oral QHS  . heparin  5,000 Units Subcutaneous 3 times per day  . levothyroxine  50 mcg Oral QAC breakfast  . loratadine  10 mg Oral Daily  . magnesium oxide  400 mg Oral Daily  . multivitamin with minerals  1 tablet Oral Daily  . pantoprazole  80 mg Oral Daily  . saccharomyces boulardii  250 mg Oral BID  . terazosin  10 mg Oral Daily  . triamcinolone cream  1 application Topical BID  . vancomycin  1,250 mg Intravenous Q24H   Continuous Infusions: . sodium chloride 100 mL/hr at 12/18/14 0126    Principal Problem:   Cellulitis of left ankle Active Problems:   Ulcerated varicose veins of leg   Essential hypertension   Cellulitis of right ankle   HLD (hyperlipidemia)   Other emphysema   Other specified hypothyroidism   Hiatal hernia    Time spent: [facility] [facility] Pager [phone]. If 7PM-7AM, please contact night-coverage at [email or web address], password [facility] 12/18/2014, 11:50 AM  LOS: 3 days

## 2014-12-18 NOTE — Progress Notes (Signed)
PT Cancellation Note  Patient Details Name: Alyssa Schmitt MRN: [id number] DOB: Jan 27, 1928   Cancelled Treatment:    Reason Eval/Treat Not Completed: Fatigue/lethargy limiting ability to participate (pt fatigued after OT session and multple up/down to [facility]).  PT will check back tomorrow as time allows.  Thanks,  Barbarann Ehlers. Bernece Gall, PT, DPT [phone]   12/18/2014, 3:20 PM

## 2014-12-19 ENCOUNTER — Inpatient Hospital Stay (HOSPITAL_COMMUNITY): Payer: Medicare Other

## 2014-12-19 LAB — URINALYSIS, ROUTINE W REFLEX MICROSCOPIC
Bilirubin Urine: NEGATIVE
Glucose, UA: NEGATIVE mg/dL
Ketones, ur: NEGATIVE mg/dL
Nitrite: NEGATIVE
Protein, ur: NEGATIVE mg/dL
Specific Gravity, Urine: 1.01 (ref 1.005–1.030)
Urobilinogen, UA: 0.2 mg/dL (ref 0.0–1.0)
pH: 6.5 (ref 5.0–8.0)

## 2014-12-19 LAB — COMPREHENSIVE METABOLIC PANEL
ALT: 19 U/L (ref 0–35)
AST: 35 U/L (ref 0–37)
Albumin: 2.7 g/dL — ABNORMAL LOW (ref 3.5–5.2)
Alkaline Phosphatase: 69 U/L (ref 39–117)
Anion gap: 5 (ref 5–15)
BUN: <5 mg/dL — ABNORMAL LOW (ref 6–23)
CO2: 27 mmol/L (ref 19–32)
Calcium: 8.2 mg/dL — ABNORMAL LOW (ref 8.4–10.5)
Chloride: 98 mEq/L (ref 96–112)
Creatinine, Ser: 0.84 mg/dL (ref 0.50–1.10)
GFR calc Af Amer: 72 mL/min — ABNORMAL LOW (ref >90)
GFR calc non Af Amer: 62 mL/min — ABNORMAL LOW (ref >90)
Glucose, Bld: 101 mg/dL — ABNORMAL HIGH (ref 70–99)
Potassium: 3.5 mmol/L (ref 3.5–5.1)
Sodium: 130 mmol/L — ABNORMAL LOW (ref 135–145)
Total Bilirubin: 1.4 mg/dL — ABNORMAL HIGH (ref 0.3–1.2)
Total Protein: 6.3 g/dL (ref 6.0–8.3)

## 2014-12-19 LAB — GLUCOSE, CAPILLARY: Glucose-Capillary: 115 mg/dL — ABNORMAL HIGH (ref 70–99)

## 2014-12-19 LAB — CBC WITH DIFFERENTIAL/PLATELET
Basophils Absolute: 0 10*3/uL (ref 0.0–0.1)
Basophils Relative: 0 % (ref 0–1)
Eosinophils Absolute: 0.2 10*3/uL (ref 0.0–0.7)
Eosinophils Relative: 4 % (ref 0–5)
HCT: 30.9 % — ABNORMAL LOW (ref 36.0–46.0)
Hemoglobin: 10.6 g/dL — ABNORMAL LOW (ref 12.0–15.0)
Lymphocytes Relative: 29 % (ref 12–46)
Lymphs Abs: 1.6 10*3/uL (ref 0.7–4.0)
MCH: 29.5 pg (ref 26.0–34.0)
MCHC: 34.3 g/dL (ref 30.0–36.0)
MCV: 86.1 fL (ref 78.0–100.0)
Monocytes Absolute: 0.9 10*3/uL (ref 0.1–1.0)
Monocytes Relative: 16 % — ABNORMAL HIGH (ref 3–12)
Neutro Abs: 2.8 10*3/uL (ref 1.7–7.7)
Neutrophils Relative %: 51 % (ref 43–77)
Platelets: 149 10*3/uL — ABNORMAL LOW (ref 150–400)
RBC: 3.59 MIL/uL — ABNORMAL LOW (ref 3.87–5.11)
RDW: 13.6 % (ref 11.5–15.5)
WBC: 5.5 10*3/uL (ref 4.0–10.5)

## 2014-12-19 LAB — MAGNESIUM: Magnesium: 1.6 mg/dL (ref 1.5–2.5)

## 2014-12-19 LAB — URINE MICROSCOPIC-ADD ON

## 2014-12-19 LAB — VANCOMYCIN, TROUGH: Vancomycin Tr: 8 ug/mL — ABNORMAL LOW (ref 10.0–20.0)

## 2014-12-19 MED ORDER — VANCOMYCIN HCL 500 MG IV SOLR
500.0000 mg | Freq: Once | INTRAVENOUS | Status: AC
  Administered 2014-12-19: 500 mg via INTRAVENOUS
  Filled 2014-12-19: qty 500

## 2014-12-19 MED ORDER — VANCOMYCIN HCL 10 G IV SOLR
1750.0000 mg | INTRAVENOUS | Status: DC
  Administered 2014-12-20: 1750 mg via INTRAVENOUS
  Filled 2014-12-19 (×2): qty 1750

## 2014-12-19 NOTE — Progress Notes (Signed)
Physical Therapy Treatment Patient Details Name: Alyssa Schmitt MRN: [id number] DOB: September 12, 1928 Today's Date: 12/19/2014    History of Present Illness Patient is a 81 y/o female who presents to the ED with c/o fever, chills, rigors, body aches and pain in her LLE ankle which has had a venous stasis ulcer for the past 2.5 weeks. Patient had been putting triamcinalone on BLE recently, and developed abrupt onset of fever and significant increase of pain at the site of ulcer over night this past evening.  Fever is 102.3 in the ED.     PT Comments    Pt continues to require 2 person max-total (A) for stand pivot transfers. Pt unable to tolerate incr WB'ing through LEs at this time. Decreased safety awareness and decr awareness of deficits noted throughout session. Recommend SNF for post acute rehab and 24/7 (A) upon D/C.  Follow Up Recommendations  SNF;Supervision/Assistance - 24 hour     Equipment Recommendations  Other (comment) (TBD)    Recommendations for Other Services       Precautions / Restrictions Precautions Precautions: Fall Precaution Comments:  h/o falls Restrictions Weight Bearing Restrictions: No    Mobility  Bed Mobility Overal bed mobility: Needs Assistance Bed Mobility: Supine to Sit     Supine to sit: Mod assist;HOB elevated     General bed mobility comments: mod (A) to bring hips out to EOB and elevate trunk to sitting position; pt with difficulty sequencing instructions   Transfers Overall transfer level: Needs assistance Equipment used: Rolling walker (2 wheeled) Transfers: Sit to/from [employer] Transfers Sit to Stand: +2 physical assistance;Max assist;From elevated surface Stand pivot transfers: +2 physical assistance;Max assist;From elevated surface       General transfer comment: pt with difficulty following commands and sequencing SPT to BSC; pt soaked in bed with urine; max (A) x 2 to perform safely; pt unable to WB through Lt LE    Ambulation/Gait             General Gait Details: unable to tolerate Weight through bil LEs in order to take steps; 2 person (A) to transfer bed <> BSC <> recliner    Stairs            Wheelchair Mobility    Modified Rankin (Stroke Patients Only)       Balance Overall balance assessment: Needs assistance;History of Falls Sitting-balance support: Feet supported;No upper extremity supported Sitting balance-Leahy Scale: Fair Sitting balance - Comments: guarded    Standing balance support: During functional activity;Bilateral upper extremity supported Standing balance-Leahy Scale: Zero Standing balance comment: 2 person (A) and RW required                     Cognition Arousal/Alertness: Awake/alert Behavior During Therapy: Anxious Overall Cognitive Status: Impaired/Different from baseline Area of Impairment: Orientation;Safety/judgement;Awareness;Problem solving Orientation Level: Disoriented to;Time       Safety/Judgement: Decreased awareness of deficits;Decreased awareness of safety Awareness: Emergent Problem Solving: Slow processing;Requires verbal cues;Requires tactile cues;Difficulty sequencing      Exercises General Exercises - Lower Extremity Ankle Circles/Pumps: AROM;Both;10 reps;Seated    General Comments General comments (skin integrity, edema, etc.): pt requiring total (A) with percare; at risk for skin breakdown due to decr mobility and incontience       Pertinent Vitals/Pain Pain Assessment: Faces Faces Pain Scale: Hurts whole lot Pain Location: bil LEs with standing or light touch Lt > Rt LE with donning/doffing socks  Pain Descriptors / Indicators: Grimacing Pain Intervention(s):  Monitored during session;Premedicated before session;Repositioned    Home Living                      Prior Function            PT Goals (current goals can now be found in the care plan section) Acute Rehab PT Goals Patient Stated Goal:  to be able to go home  PT Goal Formulation: With patient Time For Goal Achievement: 12/31/14 Potential to Achieve Goals: Fair Progress towards PT goals: Not progressing toward goals - comment (limited by pain)    Frequency  Min 3X/week    PT Plan Current plan remains appropriate    Co-evaluation             End of Session Equipment Utilized During Treatment: Gait belt Activity Tolerance: Patient limited by pain;Patient limited by fatigue Patient left: in chair;with call bell/phone within reach;with family/visitor present     Time: 6168-3729 PT Time Calculation (min) (ACUTE ONLY): 24 min  Charges:  $Therapeutic Activity: 23-37 mins                    G CodesGustavus Bryant, [place]  [phone] 12/19/2014, 11:23 AM

## 2014-12-19 NOTE — Progress Notes (Addendum)
[facility] PROGRESS NOTE  Alyssa Schmitt AST:[id number] DOB: 1928-11-11 DOA: 12/15/2014 PCP: Alesia Richards, MD        Assessment/Plan: Cellulitis of bilateral ankle Lt > Rt Continue vancomycin , patient has a PICC line in place and may be able to continue with IV antibiotics at SNF for another week, patient had a fever last night of 102 but cellulitis appears to be improving. No change in therapy today we'll rule out other causes of the patient's fever -Continue current skin care -No compression hose Continue  low-dose Robaxin for muscle spasms in the left lower extremity, IV fentanyl as pain is not controlled Ultrasound negative for DVT Discussed plan of care with daughter [name]  Fever Repeat blood culture 2 Reviewed blood cultures from 1/9 5 Also obtain a chest x-ray and a UA   Ulcerated varicose veins of leg -Stable currently don't appear to be contributed to present issues.  HTN -Terazosin 10 mg daily will currently hold, allow progressive HTN and an 81 year old (studies have shown reduce risk of falls)  HLD -lipid panel within NCEP guidelines  COPD -Stable currently not on medication  Hypothyroidism -Continue Synthroid 50 g daily -TSH within normal limits  Hiatal hernia -Will require GI consult as outpatient   Code Status: Full Family Communication: Daughter present, discussed with [name] Disposition Plan: PT/OT eval    Consultants: NA  Procedures: 1/9 PCXR;- Cardiac enlargement with mild vascular crowding. -No evidence of active pulmonary disease.  -Hiatal hernia behind the heart.  Cultures 1/9 urine pending 1/9 blood pending  Antibiotics: Vancomycin 1/9>> stopped 1/10 Clindamycin 1/10>>   HPI/Subjective: Alyssa Schmitt is a 81 y.o. WF PMHx depression, anxiety, LBBB, HTN, HLD, thyroid disease, COPD, prediabetes, venous insufficiency Presented to the ED with c/o fever, chills, rigors, body aches, pain in her LLE ankle which  has had a venous stasis ulcer for teh past 2.5 weeks. Patient had been putting triamcinalone on BLE recently, and developed abrupt onset of fever and significant increase of pain at the site of ulcer over night this past evening. Fever is 102.3 in the ED. No cough, no SOB, no abd pain, no diarrhea, no dysuria.   1/9 per patient lower extremity signs and symptoms began around Christmas and progressively worsened. Was seen by PCP and treated with doxycycline + triamcinolone cream without resolution. Yesterday patient's bilateral lower extremity became extremely painful, erythematous, positive fever measured at 102F. 1/10 patient sitting comfortably in chair with decreased lower extremity pain.  Subjective Spasms improved in her left lower extremity, patient improving erythema and pain in the left lower extremity, still has ongoing difficulty with weightbearing   Objective: Filed Vitals:   12/19/14 0558 12/19/14 0620 12/19/14 1024 12/19/14 1231  BP: 152/76  137/55   Pulse: 81  71   Temp: 102.4 F (39.1 C) 99.3 F (37.4 C) 99.2 F (37.3 C) 99.4 F (37.4 C)  TempSrc:  Oral Oral Oral  Resp: 18  16   Height:      Weight:      SpO2: 92%  93%     Intake/Output Summary (Last 24 hours) at 12/19/14 1311 Last data filed at 12/19/14 0900  Gross per 24 hour  Intake    480 ml  Output      0 ml  Net    480 ml   Filed Weights   12/15/14 0216  Weight: 80.74 kg (178 lb)     Exam: General: A/O 4, NAD, No acute respiratory distress Lungs: Clear to  auscultation bilaterally without wheezes or crackles Cardiovascular: Regular rate and rhythm positive grade 3/6 systolic murmur (previously evaluated per patient), negative gallop or rub normal S1 and S2 Abdomen: Nontender, nondistended, soft, bowel sounds positive, no rebound, no ascites, no appreciable mass Extremities: No significant cyanosis, clubbing, bilateral lower extremities 1-2+ pedal edema, erythema, warm to touch, painful to  palpation, Lt > Rt; improved from 1/9 exam   Data Reviewed: Basic Metabolic Panel:  Recent Labs Lab 12/15/14 0408 12/16/14 0435 12/17/14 0705 12/18/14 0505 12/19/14 0515  NA 133* 134* 135 132* 130*  K 3.9 3.7 3.5 3.5 3.5  CL 99 102 106 101 98  CO2 26 26 24 22 27   GLUCOSE 117* 95 88 84 101*  BUN 18 10 8  <5* <5*  CREATININE 1.01 0.86 0.88 0.68 0.84  CALCIUM 9.1 8.4 8.3* 8.3* 8.2*  MG  --  1.7 1.7 1.6 1.6   Liver Function Tests:  Recent Labs Lab 12/16/14 0435 12/17/14 0705 12/18/14 0505 12/19/14 0515  AST 26 31 28  35  ALT 17 17 17 19   ALKPHOS 65 67 64 69  BILITOT 1.0 1.0 1.3* 1.4*  PROT 5.9* 6.2 6.3 6.3  ALBUMIN 2.9* 2.9* 2.8* 2.7*   No results for input(s): LIPASE, AMYLASE in the last 168 hours. No results for input(s): AMMONIA in the last 168 hours. CBC:  Recent Labs Lab 12/15/14 0408 12/16/14 0435 12/17/14 0705 12/18/14 0505 12/19/14 0515  WBC 10.5 9.8 6.6 6.0 5.5  NEUTROABS 8.9* 7.0 4.6 3.6 2.8  HGB 12.4 10.7* 11.1* 10.7* 10.6*  HCT 36.9 32.4* 33.0* 31.7* 30.9*  MCV 88.1 87.8 87.8 88.8 86.1  PLT 154 131* 120* 125* 149*   Cardiac Enzymes: No results for input(s): CKTOTAL, CKMB, CKMBINDEX, TROPONINI in the last 168 hours. BNP (last 3 results) No results for input(s): PROBNP in the last 8760 hours. CBG: No results for input(s): GLUCAP in the last 168 hours.  Recent Results (from the past 240 hour(s))  Blood culture (routine x 2)     Status: None (Preliminary result)   Collection Time: 12/15/14  2:50 AM  Result Value Ref Range Status   Specimen Description BLOOD RIGHT FOREARM  Final   Special Requests BOTTLES DRAWN AEROBIC AND ANAEROBIC 10CC   Final   Culture   Final           BLOOD CULTURE RECEIVED NO GROWTH TO DATE CULTURE WILL BE HELD FOR 5 DAYS BEFORE ISSUING A FINAL NEGATIVE REPORT Performed at [facility]    Report Status PENDING  Incomplete  Blood culture (routine x 2)     Status: None (Preliminary result)   Collection Time:  12/15/14  4:11 AM  Result Value Ref Range Status   Specimen Description BLOOD RIGHT HAND  Final   Special Requests BOTTLES DRAWN AEROBIC ONLY [place]  Final   Culture   Final           BLOOD CULTURE RECEIVED NO GROWTH TO DATE CULTURE WILL BE HELD FOR 5 DAYS BEFORE ISSUING A FINAL NEGATIVE REPORT Performed at [facility]    Report Status PENDING  Incomplete  Urine culture     Status: None   Collection Time: 12/15/14  5:57 AM  Result Value Ref Range Status   Specimen Description URINE, RANDOM  Final   Special Requests NONE  Final   Colony Count   Final    3,000 COLONIES/ML Performed at [facility]    Culture   Final    INSIGNIFICANT  GROWTH Performed at [facility]    Report Status 12/16/2014 FINAL  Final     Studies: No results found.  Scheduled Meds: . bisacodyl  5 mg Oral BID  . calcium-vitamin D  1 tablet Oral Q breakfast  . cholecalciferol  2,000 Units Oral BID  . citalopram  5 mg Oral QHS  . heparin  5,000 Units Subcutaneous 3 times per day  . levothyroxine  50 mcg Oral QAC breakfast  . loratadine  10 mg Oral Daily  . magnesium oxide  400 mg Oral BID  . multivitamin with minerals  1 tablet Oral Daily  . pantoprazole  80 mg Oral Daily  . saccharomyces boulardii  250 mg Oral BID  . terazosin  10 mg Oral Daily  . triamcinolone cream  1 application Topical BID  . vancomycin  1,250 mg Intravenous Q24H   Continuous Infusions: . sodium chloride 100 mL/hr at 12/18/14 0126    Principal Problem:   Cellulitis of left ankle Active Problems:   Ulcerated varicose veins of leg   Essential hypertension   Cellulitis of right ankle   HLD (hyperlipidemia)   Other emphysema   Other specified hypothyroidism   Hiatal hernia    Time spent: [facility] Pager [phone]. If 7PM-7AM, please contact night-coverage at [email or web address], password [facility] 12/19/2014, 1:11 PM  LOS: 4 days

## 2014-12-19 NOTE — Progress Notes (Signed)
ANTIBIOTIC CONSULT NOTE - Restarting Vancomycin Pharmacy Consult for Vancomycin  Indication: Cellulitis  Allergies  Allergen Reactions  . Aspirin Swelling  . Celebrex [Celecoxib] Other (See Comments)    "my body lit up"  . Fosamax [Alendronate Sodium] Other (See Comments)    unknown  . Keflex [Cephalexin] Other (See Comments)    unknown  . Penicillins Other (See Comments)    unknown  . Prednisone Itching  . Prevacid [Lansoprazole] Other (See Comments)    unknown  . Sulfa Antibiotics Other (See Comments)    unknown  . Tetanus Toxoids Other (See Comments)    unknown  . Tetracyclines & Related Nausea Only    Nausea     Patient Measurements: Height: 5\' 3"  (160 cm) Weight: 178 lb (80.74 kg) IBW/kg (Calculated) : 52.4 Vital Signs: Temp: 99.5 F (37.5 C) (01/13 1431) Temp Source: Oral (01/13 1431) BP: 144/69 mmHg (01/13 1431) Pulse Rate: 77 (01/13 1431)  Labs:  Recent Labs  12/17/14 0705 12/18/14 0505 12/19/14 0515  WBC 6.6 6.0 5.5  HGB 11.1* 10.7* 10.6*  PLT 120* 125* 149*  CREATININE 0.88 0.68 0.84   Estimated Creatinine Clearance: 50.1 mL/min (by C-G formula based on Cr of 0.84).  Assessment: 81 y/o F with bilateral lower extremity cellulitis.  Received vancomycin 1250 mg IV q24h 1/9- 1/10 then was discontinued and changed to oral clindamycin, but was noted to still have significant amount of cellulitis and erythema and was restarted on IV vancomycin. No growth on blood cultures and insignificant growth on urine culture. A vancomycin trough drawn this afternoon was low at 62mcg/mL. Plan is for patient to receive 1 week of antibiotics at SNF.  Goal of Therapy:  Vancomycin trough level 10-15 mcg/ml  Plan:  -give additional vancomycin 500mg  IV x1 today, then start 1750mg  IV q24h tomorrow- kept q24h interval to simplify dosing at SNF -continue to follow renal function, clinical progression and any changes to discharge plan  Lela Gell D. Amelianna Meller, PharmD,  BCPS Clinical Pharmacist Pager: [phone] 12/19/2014 4:35 PM

## 2014-12-20 ENCOUNTER — Inpatient Hospital Stay (HOSPITAL_COMMUNITY): Payer: Medicare Other

## 2014-12-20 LAB — CBC WITH DIFFERENTIAL/PLATELET
Basophils Absolute: 0 10*3/uL (ref 0.0–0.1)
Basophils Relative: 0 % (ref 0–1)
Eosinophils Absolute: 0.3 10*3/uL (ref 0.0–0.7)
Eosinophils Relative: 5 % (ref 0–5)
HCT: 31.8 % — ABNORMAL LOW (ref 36.0–46.0)
Hemoglobin: 10.8 g/dL — ABNORMAL LOW (ref 12.0–15.0)
Lymphocytes Relative: 29 % (ref 12–46)
Lymphs Abs: 1.6 10*3/uL (ref 0.7–4.0)
MCH: 30 pg (ref 26.0–34.0)
MCHC: 34 g/dL (ref 30.0–36.0)
MCV: 88.3 fL (ref 78.0–100.0)
Monocytes Absolute: 0.7 10*3/uL (ref 0.1–1.0)
Monocytes Relative: 13 % — ABNORMAL HIGH (ref 3–12)
Neutro Abs: 2.9 10*3/uL (ref 1.7–7.7)
Neutrophils Relative %: 53 % (ref 43–77)
Platelets: 177 10*3/uL (ref 150–400)
RBC: 3.6 MIL/uL — ABNORMAL LOW (ref 3.87–5.11)
RDW: 13.6 % (ref 11.5–15.5)
WBC: 5.5 10*3/uL (ref 4.0–10.5)

## 2014-12-20 LAB — COMPREHENSIVE METABOLIC PANEL
ALT: 20 U/L (ref 0–35)
AST: 35 U/L (ref 0–37)
Albumin: 2.7 g/dL — ABNORMAL LOW (ref 3.5–5.2)
Alkaline Phosphatase: 68 U/L (ref 39–117)
Anion gap: 11 (ref 5–15)
BUN: <5 mg/dL — ABNORMAL LOW (ref 6–23)
CO2: 27 mmol/L (ref 19–32)
Calcium: 8.7 mg/dL (ref 8.4–10.5)
Chloride: 98 mEq/L (ref 96–112)
Creatinine, Ser: 0.74 mg/dL (ref 0.50–1.10)
GFR calc Af Amer: 88 mL/min — ABNORMAL LOW (ref >90)
GFR calc non Af Amer: 76 mL/min — ABNORMAL LOW (ref >90)
Glucose, Bld: 86 mg/dL (ref 70–99)
Potassium: 3.5 mmol/L (ref 3.5–5.1)
Sodium: 136 mmol/L (ref 135–145)
Total Bilirubin: 1.1 mg/dL (ref 0.3–1.2)
Total Protein: 6.4 g/dL (ref 6.0–8.3)

## 2014-12-20 LAB — MAGNESIUM: Magnesium: 1.7 mg/dL (ref 1.5–2.5)

## 2014-12-20 MED ORDER — LORAZEPAM 0.5 MG PO TABS
0.5000 mg | ORAL_TABLET | Freq: Once | ORAL | Status: AC
  Administered 2014-12-20: 0.5 mg via ORAL
  Filled 2014-12-20: qty 1

## 2014-12-20 MED ORDER — CIPROFLOXACIN HCL 500 MG PO TABS
500.0000 mg | ORAL_TABLET | Freq: Two times a day (BID) | ORAL | Status: DC
  Administered 2014-12-20 – 2014-12-21 (×3): 500 mg via ORAL
  Filled 2014-12-20 (×5): qty 1

## 2014-12-20 NOTE — Progress Notes (Addendum)
[facility] PROGRESS NOTE  Alyssa Schmitt DGU:[id number] DOB: 08-10-1928 DOA: 12/15/2014 PCP: Alesia Richards, MD  Assessment/Plan: Cellulitis of bilateral ankle Lt > Rt Continue vancomycin , patient has a PICC line in place and may be able to continue with IV antibiotics at SNF for another week, patient had a fever last night of 102 but cellulitis appears to be improving. No change in therapy Significant tenderness in the left lower extremity therefore will consider doing an MRI today, to rule out abscess -No compression hose Continue  low-dose Robaxin for muscle spasms in the left lower extremity, IV fentanyl as pain is not controlled Ultrasound negative for DVT Discussed plan of care with daughter [name]  Fever Repeat blood culture 2 Reviewed blood cultures from 1/9 5 Also obtain a chest x-ray , UA mildly positive cont ciprofloxacin, await urine culture    Ulcerated varicose veins of leg -Stable currently don't appear to be contributed to present issues.  HTN -Terazosin 10 mg daily will currently hold, allow progressive HTN and an 81 year old (studies have shown reduce risk of falls)  HLD -lipid panel within NCEP guidelines  COPD -Stable currently not on medication  Hypothyroidism -Continue Synthroid 50 g daily -TSH within normal limits  Hiatal hernia -Will require GI consult as outpatient   Code Status: Full Family Communication: Daughter present, discussed with [name] Disposition Plan: SNF, anticipate discharge in one to 2 days    Consultants: NA  Procedures: 1/9 PCXR;- Cardiac enlargement with mild vascular crowding. -No evidence of active pulmonary disease.  -Hiatal hernia behind the heart.  Cultures 1/9 urine pending 1/9 blood pending  Antibiotics: Vancomycin 1/9>> stopped 1/10 Clindamycin 1/10>>   HPI/Subjective: Alyssa Schmitt is a 81 y.o. WF PMHx depression, anxiety, LBBB, HTN, HLD, thyroid disease, COPD, prediabetes, venous  insufficiency Presented to the ED with c/o fever, chills, rigors, body aches, pain in her LLE ankle which has had a venous stasis ulcer for teh past 2.5 weeks. Patient had been putting triamcinalone on BLE recently, and developed abrupt onset of fever and significant increase of pain at the site of ulcer over night this past evening. Fever is 102.3 in the ED. No cough, no SOB, no abd pain, no diarrhea, no dysuria.   1/9 per patient lower extremity signs and symptoms began around Christmas and progressively worsened. Was seen by PCP and treated with doxycycline + triamcinolone cream without resolution. Yesterday patient's bilateral lower extremity became extremely painful, erythematous, positive fever measured at 102F. 1/10 patient sitting comfortably in chair with decreased lower extremity pain.  Subjective Patient still complaining of pain in the distal outer aspect of the left lower extremity   Objective: Filed Vitals:   12/19/14 1804 12/19/14 2144 12/20/14 0600 12/20/14 1054  BP: 138/67 142/66 149/61 148/61  Pulse: 81 72 74   Temp: 99.8 F (37.7 C) 99.9 F (37.7 C) 99.8 F (37.7 C)   TempSrc: Oral     Resp: 16 16 16    Height:      Weight:      SpO2: 94% 94% 93%     Intake/Output Summary (Last 24 hours) at 12/20/14 1319 Last data filed at 12/20/14 0800  Gross per 24 hour  Intake   1030 ml  Output      0 ml  Net   1030 ml   Filed Weights   12/15/14 0216  Weight: 80.74 kg (178 lb)     Exam: General: A/O 4, NAD, No acute respiratory distress Lungs: Clear to auscultation bilaterally  without wheezes or crackles Cardiovascular: Regular rate and rhythm positive grade 3/6 systolic murmur (previously evaluated per patient), negative gallop or rub normal S1 and S2 Abdomen: Nontender, nondistended, soft, bowel sounds positive, no rebound, no ascites, no appreciable mass Extremities: No significant cyanosis, clubbing, bilateral lower extremities 1-2+ pedal edema, erythema, warm  to touch, significant fluctuance, tender to palpation on the outer aspect of the left lower extremity Data Reviewed: Basic Metabolic Panel:  Recent Labs Lab 12/16/14 0435 12/17/14 0705 12/18/14 0505 12/19/14 0515 12/20/14 0507  NA 134* 135 132* 130* 136  K 3.7 3.5 3.5 3.5 3.5  CL 102 106 101 98 98  CO2 26 24 22 27 27   GLUCOSE 95 88 84 101* 86  BUN 10 8 <5* <5* <5*  CREATININE 0.86 0.88 0.68 0.84 0.74  CALCIUM 8.4 8.3* 8.3* 8.2* 8.7  MG 1.7 1.7 1.6 1.6 1.7   Liver Function Tests:  Recent Labs Lab 12/16/14 0435 12/17/14 0705 12/18/14 0505 12/19/14 0515 12/20/14 0507  AST 26 31 28  35 35  ALT 17 17 17 19 20   ALKPHOS 65 67 64 69 68  BILITOT 1.0 1.0 1.3* 1.4* 1.1  PROT 5.9* 6.2 6.3 6.3 6.4  ALBUMIN 2.9* 2.9* 2.8* 2.7* 2.7*   No results for input(s): LIPASE, AMYLASE in the last 168 hours. No results for input(s): AMMONIA in the last 168 hours. CBC:  Recent Labs Lab 12/16/14 0435 12/17/14 0705 12/18/14 0505 12/19/14 0515 12/20/14 0507  WBC 9.8 6.6 6.0 5.5 5.5  NEUTROABS 7.0 4.6 3.6 2.8 2.9  HGB 10.7* 11.1* 10.7* 10.6* 10.8*  HCT 32.4* 33.0* 31.7* 30.9* 31.8*  MCV 87.8 87.8 88.8 86.1 88.3  PLT 131* 120* 125* 149* 177   Cardiac Enzymes: No results for input(s): CKTOTAL, CKMB, CKMBINDEX, TROPONINI in the last 168 hours. BNP (last 3 results) No results for input(s): PROBNP in the last 8760 hours. CBG:  Recent Labs Lab 12/19/14 2129  GLUCAP 115*    Recent Results (from the past 240 hour(s))  Blood culture (routine x 2)     Status: None (Preliminary result)   Collection Time: 12/15/14  2:50 AM  Result Value Ref Range Status   Specimen Description BLOOD RIGHT FOREARM  Final   Special Requests BOTTLES DRAWN AEROBIC AND ANAEROBIC 10CC   Final   Culture   Final           BLOOD CULTURE RECEIVED NO GROWTH TO DATE CULTURE WILL BE HELD FOR 5 DAYS BEFORE ISSUING A FINAL NEGATIVE REPORT Performed at [facility]    Report Status PENDING  Incomplete  Blood  culture (routine x 2)     Status: None (Preliminary result)   Collection Time: 12/15/14  4:11 AM  Result Value Ref Range Status   Specimen Description BLOOD RIGHT HAND  Final   Special Requests BOTTLES DRAWN AEROBIC ONLY [place]  Final   Culture   Final           BLOOD CULTURE RECEIVED NO GROWTH TO DATE CULTURE WILL BE HELD FOR 5 DAYS BEFORE ISSUING A FINAL NEGATIVE REPORT Performed at [facility]    Report Status PENDING  Incomplete  Urine culture     Status: None   Collection Time: 12/15/14  5:57 AM  Result Value Ref Range Status   Specimen Description URINE, RANDOM  Final   Special Requests NONE  Final   Colony Count   Final    3,000 COLONIES/ML Performed at [facility]  Final    INSIGNIFICANT GROWTH Performed at [facility]    Report Status 12/16/2014 FINAL  Final  Culture, blood (routine x 2)     Status: None (Preliminary result)   Collection Time: 12/19/14  2:06 PM  Result Value Ref Range Status   Specimen Description BLOOD LEFT HAND  Final   Special Requests BOTTLES DRAWN AEROBIC ONLY 2CC  Final   Culture   Final           BLOOD CULTURE RECEIVED NO GROWTH TO DATE CULTURE WILL BE HELD FOR 5 DAYS BEFORE ISSUING A FINAL NEGATIVE REPORT Performed at [facility]    Report Status PENDING  Incomplete  Culture, blood (routine x 2)     Status: None (Preliminary result)   Collection Time: 12/19/14  2:21 PM  Result Value Ref Range Status   Specimen Description BLOOD LEFT ARM  Final   Special Requests BOTTLES DRAWN AEROBIC ONLY [place]  Final   Culture   Final           BLOOD CULTURE RECEIVED NO GROWTH TO DATE CULTURE WILL BE HELD FOR 5 DAYS BEFORE ISSUING A FINAL NEGATIVE REPORT Performed at [facility]    Report Status PENDING  Incomplete     Studies: Dg Chest Port 1 View  12/19/2014   CLINICAL DATA:  Fever  EXAM: PORTABLE CHEST - 1 VIEW  COMPARISON:  12/15/2014  FINDINGS: Cardiomegaly again noted. Large hiatal hernia  containing air measures at least 6 x 12.2 cm. There is right subclavian central line with tip in right atrium. Mild left basilar atelectasis or infiltrate. No pulmonary edema.  IMPRESSION: No pulmonary edema. Right subclavian central line with tip in right atrium. No pneumothorax. Large hiatal hernia measures at least 12 by 6 cm. Left basilar atelectasis or infiltrate.   Electronically Signed   By: Lahoma Crocker M.D.   On: 12/19/2014 13:17    Scheduled Meds: . bisacodyl  5 mg Oral BID  . calcium-vitamin D  1 tablet Oral Q breakfast  . cholecalciferol  2,000 Units Oral BID  . ciprofloxacin  500 mg Oral BID  . citalopram  5 mg Oral QHS  . heparin  5,000 Units Subcutaneous 3 times per day  . levothyroxine  50 mcg Oral QAC breakfast  . loratadine  10 mg Oral Daily  . magnesium oxide  400 mg Oral BID  . multivitamin with minerals  1 tablet Oral Daily  . pantoprazole  80 mg Oral Daily  . saccharomyces boulardii  250 mg Oral BID  . terazosin  10 mg Oral Daily  . triamcinolone cream  1 application Topical BID  . vancomycin  1,750 mg Intravenous Q24H   Continuous Infusions: . sodium chloride 10 mL/hr at 12/18/14 9147    Principal Problem:   Cellulitis of left ankle Active Problems:   Ulcerated varicose veins of leg   Essential hypertension   Cellulitis of right ankle   HLD (hyperlipidemia)   Other emphysema   Other specified hypothyroidism   Hiatal hernia    Time spent: [facility] Pager [phone]. If 7PM-7AM, please contact night-coverage at [email or web address], password [facility] 12/20/2014, 1:19 PM  LOS: 5 days

## 2014-12-21 DIAGNOSIS — J449 Chronic obstructive pulmonary disease, unspecified: Secondary | ICD-10-CM | POA: Diagnosis not present

## 2014-12-21 DIAGNOSIS — I10 Essential (primary) hypertension: Secondary | ICD-10-CM | POA: Diagnosis not present

## 2014-12-21 DIAGNOSIS — E785 Hyperlipidemia, unspecified: Secondary | ICD-10-CM | POA: Diagnosis not present

## 2014-12-21 DIAGNOSIS — I501 Left ventricular failure: Secondary | ICD-10-CM | POA: Diagnosis not present

## 2014-12-21 DIAGNOSIS — E559 Vitamin D deficiency, unspecified: Secondary | ICD-10-CM | POA: Diagnosis not present

## 2014-12-21 DIAGNOSIS — I872 Venous insufficiency (chronic) (peripheral): Secondary | ICD-10-CM | POA: Diagnosis not present

## 2014-12-21 DIAGNOSIS — E039 Hypothyroidism, unspecified: Secondary | ICD-10-CM | POA: Diagnosis not present

## 2014-12-21 DIAGNOSIS — L03115 Cellulitis of right lower limb: Secondary | ICD-10-CM | POA: Diagnosis not present

## 2014-12-21 DIAGNOSIS — D649 Anemia, unspecified: Secondary | ICD-10-CM | POA: Diagnosis not present

## 2014-12-21 DIAGNOSIS — E550 Rickets, active: Secondary | ICD-10-CM | POA: Diagnosis not present

## 2014-12-21 DIAGNOSIS — Z79899 Other long term (current) drug therapy: Secondary | ICD-10-CM | POA: Diagnosis not present

## 2014-12-21 DIAGNOSIS — I83005 Varicose veins of unspecified lower extremity with ulcer other part of foot: Secondary | ICD-10-CM | POA: Diagnosis not present

## 2014-12-21 DIAGNOSIS — L0390 Cellulitis, unspecified: Secondary | ICD-10-CM | POA: Diagnosis not present

## 2014-12-21 DIAGNOSIS — I83008 Varicose veins of unspecified lower extremity with ulcer other part of lower leg: Secondary | ICD-10-CM | POA: Diagnosis not present

## 2014-12-21 DIAGNOSIS — I83028 Varicose veins of left lower extremity with ulcer other part of lower leg: Secondary | ICD-10-CM | POA: Diagnosis not present

## 2014-12-21 DIAGNOSIS — J439 Emphysema, unspecified: Secondary | ICD-10-CM | POA: Diagnosis not present

## 2014-12-21 DIAGNOSIS — R279 Unspecified lack of coordination: Secondary | ICD-10-CM | POA: Diagnosis not present

## 2014-12-21 DIAGNOSIS — L03126 Acute lymphangitis of left lower limb: Secondary | ICD-10-CM | POA: Diagnosis not present

## 2014-12-21 DIAGNOSIS — N390 Urinary tract infection, site not specified: Secondary | ICD-10-CM | POA: Diagnosis not present

## 2014-12-21 DIAGNOSIS — R2689 Other abnormalities of gait and mobility: Secondary | ICD-10-CM | POA: Diagnosis not present

## 2014-12-21 DIAGNOSIS — L03116 Cellulitis of left lower limb: Secondary | ICD-10-CM | POA: Diagnosis not present

## 2014-12-21 DIAGNOSIS — I83009 Varicose veins of unspecified lower extremity with ulcer of unspecified site: Secondary | ICD-10-CM | POA: Diagnosis not present

## 2014-12-21 DIAGNOSIS — M6281 Muscle weakness (generalized): Secondary | ICD-10-CM | POA: Diagnosis not present

## 2014-12-21 DIAGNOSIS — L02419 Cutaneous abscess of limb, unspecified: Secondary | ICD-10-CM | POA: Diagnosis not present

## 2014-12-21 LAB — CBC WITH DIFFERENTIAL/PLATELET
Basophils Absolute: 0 10*3/uL (ref 0.0–0.1)
Basophils Relative: 1 % (ref 0–1)
Eosinophils Absolute: 0.3 10*3/uL (ref 0.0–0.7)
Eosinophils Relative: 6 % — ABNORMAL HIGH (ref 0–5)
HCT: 31.5 % — ABNORMAL LOW (ref 36.0–46.0)
Hemoglobin: 10.7 g/dL — ABNORMAL LOW (ref 12.0–15.0)
Lymphocytes Relative: 25 % (ref 12–46)
Lymphs Abs: 1.3 10*3/uL (ref 0.7–4.0)
MCH: 29.6 pg (ref 26.0–34.0)
MCHC: 34 g/dL (ref 30.0–36.0)
MCV: 87.3 fL (ref 78.0–100.0)
Monocytes Absolute: 0.8 10*3/uL (ref 0.1–1.0)
Monocytes Relative: 15 % — ABNORMAL HIGH (ref 3–12)
Neutro Abs: 2.8 10*3/uL (ref 1.7–7.7)
Neutrophils Relative %: 53 % (ref 43–77)
Platelets: 202 10*3/uL (ref 150–400)
RBC: 3.61 MIL/uL — ABNORMAL LOW (ref 3.87–5.11)
RDW: 13.5 % (ref 11.5–15.5)
WBC: 5.3 10*3/uL (ref 4.0–10.5)

## 2014-12-21 LAB — BASIC METABOLIC PANEL
Anion gap: 8 (ref 5–15)
BUN: <5 mg/dL — ABNORMAL LOW (ref 6–23)
CO2: 28 mmol/L (ref 19–32)
Calcium: 8.4 mg/dL (ref 8.4–10.5)
Chloride: 99 mEq/L (ref 96–112)
Creatinine, Ser: 0.74 mg/dL (ref 0.50–1.10)
GFR calc Af Amer: 88 mL/min — ABNORMAL LOW (ref >90)
GFR calc non Af Amer: 76 mL/min — ABNORMAL LOW (ref >90)
Glucose, Bld: 85 mg/dL (ref 70–99)
Potassium: 3.6 mmol/L (ref 3.5–5.1)
Sodium: 135 mmol/L (ref 135–145)

## 2014-12-21 LAB — CULTURE, BLOOD (ROUTINE X 2)
Culture: NO GROWTH
Culture: NO GROWTH

## 2014-12-21 LAB — MAGNESIUM: Magnesium: 1.8 mg/dL (ref 1.5–2.5)

## 2014-12-21 MED ORDER — SACCHAROMYCES BOULARDII 250 MG PO CAPS: 250.0000 mg | ORAL_CAPSULE | Freq: Two times a day (BID) | ORAL | Status: DC

## 2014-12-21 MED ORDER — CIPROFLOXACIN HCL 500 MG PO TABS: 500.0000 mg | ORAL_TABLET | Freq: Two times a day (BID) | ORAL | Status: DC

## 2014-12-21 MED ORDER — HEPARIN SOD (PORK) LOCK FLUSH 100 UNIT/ML IV SOLN
250.0000 [IU] | INTRAVENOUS | Status: AC | PRN
  Administered 2014-12-21: 250 [IU]

## 2014-12-21 MED ORDER — HYDROCODONE-ACETAMINOPHEN 5-500 MG PO TABS: 1.0000 | ORAL_TABLET | Freq: Four times a day (QID) | ORAL | Status: DC | PRN

## 2014-12-21 MED ORDER — ACETAMINOPHEN 325 MG PO TABS: 650.0000 mg | ORAL_TABLET | Freq: Four times a day (QID) | ORAL | Status: AC | PRN

## 2014-12-21 MED ORDER — MAGNESIUM OXIDE 400 (241.3 MG) MG PO TABS: 400.0000 mg | ORAL_TABLET | Freq: Two times a day (BID) | ORAL | Status: AC

## 2014-12-21 MED ORDER — METHOCARBAMOL 500 MG PO TABS: 250.0000 mg | ORAL_TABLET | Freq: Four times a day (QID) | ORAL | Status: DC | PRN

## 2014-12-21 MED ORDER — BISACODYL 5 MG PO TBEC: 5.0000 mg | DELAYED_RELEASE_TABLET | Freq: Two times a day (BID) | ORAL | Status: DC

## 2014-12-21 MED ORDER — VANCOMYCIN HCL 10 G IV SOLR: 1750.0000 mg | INTRAVENOUS | Status: DC

## 2014-12-21 NOTE — Discharge Summary (Signed)
Physician Discharge Summary  Alyssa Schmitt MRN: [id number] DOB/AGE: 1928-05-08 81 y.o.  PCP: Alesia Richards, MD   Admit date: 12/15/2014 Discharge date: 12/21/2014  Discharge Diagnoses:     Cellulitis of left ankle UTI   Ulcerated varicose veins of leg   Essential hypertension   Cellulitis of right ankle   HLD (hyperlipidemia)   Other emphysema   Other specified hypothyroidism   Hiatal hernia  Follow-up recommendations Continue vancomycin IV 7 days, stop date 1/21 PCP to decide if the patient's antibiotics need to be extended by another 3-5 days if no resolution of erythema PCP to order discontinuation of PICC line once antibiotics are completed Vancomycin trough monitoring by pharmacy at SNF BMP every 48 hours as long as the patient is on vancomycin       Medication List    STOP taking these medications        Magnesium 250 MG Tabs     traMADol 50 MG tablet  Commonly known as:  ULTRAM      TAKE these medications        acetaminophen 325 MG tablet  Commonly known as:  TYLENOL  Take 2 tablets (650 mg total) by mouth every 6 (six) hours as needed (prn TEMP>38.1 Celsius).     bisacodyl 5 MG EC tablet  Commonly known as:  DULCOLAX  Take 1 tablet (5 mg total) by mouth 2 (two) times daily.     busPIRone 10 MG tablet  Commonly known as:  BUSPAR  TAKE 1/2 - 1 TABLET 3 TIMES A DAY AS NEEDED FOR ANXIETY     calcium-vitamin D 250-125 MG-UNIT per tablet  Commonly known as:  OSCAL WITH D  Take 1 tablet by mouth daily.     ciprofloxacin 500 MG tablet  Commonly known as:  CIPRO  Take 1 tablet (500 mg total) by mouth 2 (two) times daily.     citalopram 20 MG tablet  Commonly known as:  CELEXA  1/2-1 pill daily     fexofenadine 180 MG tablet  Commonly known as:  ALLEGRA  Take 180 mg by mouth daily.     HYDROcodone-acetaminophen 5-500 MG per tablet  Commonly known as:  VICODIN  Take 1 tablet by mouth every 6 (six) hours as needed for pain.      levothyroxine 50 MCG tablet  Commonly known as:  SYNTHROID, LEVOTHROID  TAKE 1 TABLET BY MOUTH DAILY     magnesium oxide 400 (241.3 MG) MG tablet  Commonly known as:  MAG-OX  Take 1 tablet (400 mg total) by mouth 2 (two) times daily.     methocarbamol 500 MG tablet  Commonly known as:  ROBAXIN  Take 0.5 tablets (250 mg total) by mouth every 6 (six) hours as needed for muscle spasms.     multivitamin with minerals Tabs tablet  Take 1 tablet by mouth daily.     omeprazole 40 MG capsule  Commonly known as:  PRILOSEC  TAKE 1 CAPSULE DAILY     PROBIOTIC FORMULA PO  Take 1 tablet by mouth daily.     saccharomyces boulardii 250 MG capsule  Commonly known as:  FLORASTOR  Take 1 capsule (250 mg total) by mouth 2 (two) times daily.     terazosin 10 MG capsule  Commonly known as:  HYTRIN  Take 1 capsule (10 mg total) by mouth daily.     triamcinolone cream 0.1 %  Commonly known as:  KENALOG  Apply 1 application topically 3 (  three) times daily.     vancomycin 1,750 mg in sodium chloride 0.9 % 500 mL  Inject 1,750 mg into the vein daily.     Vitamin D3 2000 UNITS Tabs  Take 4 tablets by mouth daily.        Discharge Condition:    Disposition: 01-Home or Self Care   Consults:  Significant Diagnostic Studies: Mr Tibia Fibula Left Wo Contrast  12/21/2014   CLINICAL DATA:  Left lower leg pain, swelling and erythema. History of venous stasis ulcers. Evaluate for abscess. Initial encounter.  EXAM: MRI OF LOWER LEFT EXTREMITY WITHOUT CONTRAST  TECHNIQUE: Multiplanar, multisequence MR imaging of the left lower leg was performed. No intravenous contrast was administered.  COMPARISON:  None.  FINDINGS: Both lower legs are included on the axial and coronal images.  There is asymmetric subcutaneous edema within the left lower leg which appears fairly diffuse. No focal fluid collection demonstrated. There is no abnormal signal within the lower leg musculature. Small superficial  varicosities are present within both lower legs. There is nonspecific mildly increased signal within the deep veins of the left lower leg, probably due to slow flow (incompletely evaluated on this noncontrast study).  There is no evidence of osteomyelitis or acute osseous abnormality in either lower leg.  IMPRESSION: 1. Asymmetric left lower leg subcutaneous edema may reflect cellulitis or sequela of venous stasis. No evidence of soft tissue abscess or myositis. 2. No evidence of osteomyelitis.   Electronically Signed   By: Camie Patience M.D.   On: 12/21/2014 08:01   Dg Chest Port 1 View  12/19/2014   CLINICAL DATA:  Fever  EXAM: PORTABLE CHEST - 1 VIEW  COMPARISON:  12/15/2014  FINDINGS: Cardiomegaly again noted. Large hiatal hernia containing air measures at least 6 x 12.2 cm. There is right subclavian central line with tip in right atrium. Mild left basilar atelectasis or infiltrate. No pulmonary edema.  IMPRESSION: No pulmonary edema. Right subclavian central line with tip in right atrium. No pneumothorax. Large hiatal hernia measures at least 12 by 6 cm. Left basilar atelectasis or infiltrate.   Electronically Signed   By: Lahoma Crocker M.D.   On: 12/19/2014 13:17   Dg Chest Port 1 View  12/15/2014   CLINICAL DATA:  Fever since yesterday.  EXAM: PORTABLE CHEST - 1 VIEW  COMPARISON:  08/16/2012  FINDINGS: Shallow inspiration. Cardiac enlargement with mild prominence of pulmonary vascularity, likely normal for technique. No focal airspace consolidation in the lungs. No blunting of costophrenic angles. No pneumothorax. Large esophageal hiatal hernia behind the heart.  IMPRESSION: Shallow inspiration. Cardiac enlargement with mild vascular crowding. No evidence of active pulmonary disease. Hiatal hernia behind the heart.   Electronically Signed   By: Lucienne Capers M.D.   On: 12/15/2014 05:31      Microbiology: Recent Results (from the past 240 hour(s))  Blood culture (routine x 2)     Status: None    Collection Time: 12/15/14  2:50 AM  Result Value Ref Range Status   Specimen Description BLOOD RIGHT FOREARM  Final   Special Requests BOTTLES DRAWN AEROBIC AND ANAEROBIC 10CC   Final   Culture   Final    NO GROWTH 5 DAYS Performed at [facility]    Report Status 12/21/2014 FINAL  Final  Blood culture (routine x 2)     Status: None   Collection Time: 12/15/14  4:11 AM  Result Value Ref Range Status   Specimen Description BLOOD RIGHT HAND  Final   Special Requests BOTTLES DRAWN AEROBIC ONLY [place]  Final   Culture   Final    NO GROWTH 5 DAYS Performed at [facility]    Report Status 12/21/2014 FINAL  Final  Urine culture     Status: None   Collection Time: 12/15/14  5:57 AM  Result Value Ref Range Status   Specimen Description URINE, RANDOM  Final   Special Requests NONE  Final   Colony Count   Final    3,000 COLONIES/ML Performed at [facility]    Culture   Final    INSIGNIFICANT GROWTH Performed at [facility]    Report Status 12/16/2014 FINAL  Final  Culture, blood (routine x 2)     Status: None (Preliminary result)   Collection Time: 12/19/14  2:06 PM  Result Value Ref Range Status   Specimen Description BLOOD LEFT HAND  Final   Special Requests BOTTLES DRAWN AEROBIC ONLY 2CC  Final   Culture   Final           BLOOD CULTURE RECEIVED NO GROWTH TO DATE CULTURE WILL BE HELD FOR 5 DAYS BEFORE ISSUING A FINAL NEGATIVE REPORT Performed at [facility]    Report Status PENDING  Incomplete  Culture, blood (routine x 2)     Status: None (Preliminary result)   Collection Time: 12/19/14  2:21 PM  Result Value Ref Range Status   Specimen Description BLOOD LEFT ARM  Final   Special Requests BOTTLES DRAWN AEROBIC ONLY [place]  Final   Culture   Final           BLOOD CULTURE RECEIVED NO GROWTH TO DATE CULTURE WILL BE HELD FOR 5 DAYS BEFORE ISSUING A FINAL NEGATIVE REPORT Performed at [facility]    Report Status PENDING  Incomplete      Labs: Results for orders placed or performed during the hospital encounter of 12/15/14 (from the past 48 hour(s))  Urinalysis, Routine w reflex microscopic     Status: Abnormal   Collection Time: 12/19/14  1:57 PM  Result Value Ref Range   Color, Urine YELLOW YELLOW   APPearance HAZY (A) CLEAR   Specific Gravity, Urine 1.010 1.005 - 1.030   pH 6.5 5.0 - 8.0   Glucose, UA NEGATIVE NEGATIVE mg/dL   Hgb urine dipstick SMALL (A) NEGATIVE   Bilirubin Urine NEGATIVE NEGATIVE   Ketones, ur NEGATIVE NEGATIVE mg/dL   Protein, ur NEGATIVE NEGATIVE mg/dL   Urobilinogen, UA 0.2 0.0 - 1.0 mg/dL   Nitrite NEGATIVE NEGATIVE   Leukocytes, UA MODERATE (A) NEGATIVE  Urine microscopic-add on     Status: Abnormal   Collection Time: 12/19/14  1:57 PM  Result Value Ref Range   Squamous Epithelial / LPF MANY (A) RARE   WBC, UA 11-20 <3 WBC/hpf   RBC / HPF 0-2 <3 RBC/hpf   Bacteria, UA FEW (A) RARE  Culture, blood (routine x 2)     Status: None (Preliminary result)   Collection Time: 12/19/14  2:06 PM  Result Value Ref Range   Specimen Description BLOOD LEFT HAND    Special Requests BOTTLES DRAWN AEROBIC ONLY 2CC    Culture             BLOOD CULTURE RECEIVED NO GROWTH TO DATE CULTURE WILL BE HELD FOR 5 DAYS BEFORE ISSUING A FINAL NEGATIVE REPORT Performed at [facility]    Report Status PENDING   Vancomycin, trough     Status:  Abnormal   Collection Time: 12/19/14  2:21 PM  Result Value Ref Range   Vancomycin Tr 8.0 (L) 10.0 - 20.0 ug/mL  Culture, blood (routine x 2)     Status: None (Preliminary result)   Collection Time: 12/19/14  2:21 PM  Result Value Ref Range   Specimen Description BLOOD LEFT ARM    Special Requests BOTTLES DRAWN AEROBIC ONLY [place]    Culture             BLOOD CULTURE RECEIVED NO GROWTH TO DATE CULTURE WILL BE HELD FOR 5 DAYS BEFORE ISSUING A FINAL NEGATIVE REPORT Performed at [facility]    Report Status PENDING   Glucose, capillary     Status:  Abnormal   Collection Time: 12/19/14  9:29 PM  Result Value Ref Range   Glucose-Capillary 115 (H) 70 - 99 mg/dL  Comprehensive metabolic panel     Status: Abnormal   Collection Time: 12/20/14  5:07 AM  Result Value Ref Range   Sodium 136 135 - 145 mmol/L    Comment: Please note change in reference range.   Potassium 3.5 3.5 - 5.1 mmol/L    Comment: Please note change in reference range.   Chloride 98 96 - 112 mEq/L   CO2 27 19 - 32 mmol/L   Glucose, Bld 86 70 - 99 mg/dL   BUN <5 (L) 6 - 23 mg/dL    Comment: CONSISTENT WITH PREVIOUS RESULT   Creatinine, Ser 0.74 0.50 - 1.10 mg/dL   Calcium 8.7 8.4 - 10.5 mg/dL   Total Protein 6.4 6.0 - 8.3 g/dL   Albumin 2.7 (L) 3.5 - 5.2 g/dL   AST 35 0 - 37 U/L   ALT 20 0 - 35 U/L   Alkaline Phosphatase 68 39 - 117 U/L   Total Bilirubin 1.1 0.3 - 1.2 mg/dL   GFR calc non Af Amer 76 (L) >90 mL/min   GFR calc Af Amer 88 (L) >90 mL/min    Comment: (NOTE) The eGFR has been calculated using the CKD EPI equation. This calculation has not been validated in all clinical situations. eGFR's persistently <90 mL/min signify possible Chronic Kidney Disease.    Anion gap 11 5 - 15  CBC with Differential     Status: Abnormal   Collection Time: 12/20/14  5:07 AM  Result Value Ref Range   WBC 5.5 4.0 - 10.5 K/uL   RBC 3.60 (L) 3.87 - 5.11 MIL/uL   Hemoglobin 10.8 (L) 12.0 - 15.0 g/dL   HCT 31.8 (L) 36.0 - 46.0 %   MCV 88.3 78.0 - 100.0 fL   MCH 30.0 26.0 - 34.0 pg   MCHC 34.0 30.0 - 36.0 g/dL   RDW 13.6 11.5 - 15.5 %   Platelets 177 150 - 400 K/uL   Neutrophils Relative % 53 43 - 77 %   Neutro Abs 2.9 1.7 - 7.7 K/uL   Lymphocytes Relative 29 12 - 46 %   Lymphs Abs 1.6 0.7 - 4.0 K/uL   Monocytes Relative 13 (H) 3 - 12 %   Monocytes Absolute 0.7 0.1 - 1.0 K/uL   Eosinophils Relative 5 0 - 5 %   Eosinophils Absolute 0.3 0.0 - 0.7 K/uL   Basophils Relative 0 0 - 1 %   Basophils Absolute 0.0 0.0 - 0.1 K/uL  Magnesium     Status: None   Collection  Time: 12/20/14  5:07 AM  Result Value Ref Range   Magnesium 1.7 1.5 -  2.5 mg/dL  CBC with Differential     Status: Abnormal   Collection Time: 12/21/14  5:35 AM  Result Value Ref Range   WBC 5.3 4.0 - 10.5 K/uL   RBC 3.61 (L) 3.87 - 5.11 MIL/uL   Hemoglobin 10.7 (L) 12.0 - 15.0 g/dL   HCT 31.5 (L) 36.0 - 46.0 %   MCV 87.3 78.0 - 100.0 fL   MCH 29.6 26.0 - 34.0 pg   MCHC 34.0 30.0 - 36.0 g/dL   RDW 13.5 11.5 - 15.5 %   Platelets 202 150 - 400 K/uL   Neutrophils Relative % 53 43 - 77 %   Neutro Abs 2.8 1.7 - 7.7 K/uL   Lymphocytes Relative 25 12 - 46 %   Lymphs Abs 1.3 0.7 - 4.0 K/uL   Monocytes Relative 15 (H) 3 - 12 %   Monocytes Absolute 0.8 0.1 - 1.0 K/uL   Eosinophils Relative 6 (H) 0 - 5 %   Eosinophils Absolute 0.3 0.0 - 0.7 K/uL   Basophils Relative 1 0 - 1 %   Basophils Absolute 0.0 0.0 - 0.1 K/uL  Magnesium     Status: None   Collection Time: 12/21/14  5:35 AM  Result Value Ref Range   Magnesium 1.8 1.5 - 2.5 mg/dL  Basic metabolic panel     Status: Abnormal   Collection Time: 12/21/14  5:35 AM  Result Value Ref Range   Sodium 135 135 - 145 mmol/L    Comment: Please note change in reference range.   Potassium 3.6 3.5 - 5.1 mmol/L    Comment: Please note change in reference range.   Chloride 99 96 - 112 mEq/L   CO2 28 19 - 32 mmol/L   Glucose, Bld 85 70 - 99 mg/dL   BUN <5 (L) 6 - 23 mg/dL    Comment: CONSISTENT WITH PREVIOUS RESULT   Creatinine, Ser 0.74 0.50 - 1.10 mg/dL   Calcium 8.4 8.4 - 10.5 mg/dL   GFR calc non Af Amer 76 (L) >90 mL/min   GFR calc Af Amer 88 (L) >90 mL/min    Comment: (NOTE) The eGFR has been calculated using the CKD EPI equation. This calculation has not been validated in all clinical situations. eGFR's persistently <90 mL/min signify possible Chronic Kidney Disease.    Anion gap 8 5 - 15     HPI :HPI/Subjective: Alyssa Schmitt is a 81 y.o. WF PMHx depression, anxiety, LBBB, HTN, HLD, thyroid disease, COPD, prediabetes, venous  insufficiency Presented to the ED with c/o fever, chills, rigors, body aches, pain in her LLE ankle which has had a venous stasis ulcer for teh past 2.5 weeks. Patient had been putting triamcinalone on BLE recently, and developed abrupt onset of fever and significant increase of pain at the site of ulcer over night this past evening. Fever is 102.3 in the ED. No cough, no SOB, no abd pain, no diarrhea, no dysuria.  HOSPITAL COURSE:  Assessment/Plan: Cellulitis of bilateral ankle Lt > Rt Initiated on vancomycin IV continue for another 7 days, status post PICC line placement Improving cellulitis with good resolution of fever Monitoring physician may decide to extend treatment by another 3-5 days if patient has persistent erythema MRI of the leg is as follows Asymmetric left lower leg subcutaneous edema may reflect cellulitis or sequela of venous stasis. No evidence of soft tissue abscess or myositis. 2. No evidence of osteomyelitis.  -No compression hose until infection resolves Continue low-dose Robaxin for muscle  spasms in the left lower extremity, Vicodin for pain control Ultrasound negative for DVT Discussed plan of care with daughter [name] Discharge to SNF today  Isolated Fever 1 on 1/13 Blood culture negative to date Reviewed blood cultures from 1/9 5 Workup including chest x-ray UA showed mild UTI and patient was restarted on ciprofloxacin Continue ciprofloxacin for another 3 days Urine culture shows insignificant growth    Ulcerated varicose veins of leg -Stable currently don't appear to be contributed to present issues.  HTN -Terazosin 10 mg daily will currently hold, allow progressive HTN and an 81 year old (studies have shown reduce risk of falls)  HLD -lipid panel within NCEP guidelines  COPD -Stable currently not on medication  Hypothyroidism -Continue Synthroid 50 g daily -TSH within normal limits  Hiatal hernia May benefit from outpatient GI  evaluation for swallowing has not been an issue   Code Status: Full  Discharge Exam:  Blood pressure 139/62, pulse 72, temperature 98.7 F (37.1 C), temperature source Oral, resp. rate 16, height 5' 3"  (1.6 m), weight 80.74 kg (178 lb), SpO2 92 %. General: A/O 4, NAD, No acute respiratory distress Lungs: Clear to auscultation bilaterally without wheezes or crackles Cardiovascular: Regular rate and rhythm positive grade 3/6 systolic murmur (previously evaluated per patient), negative gallop or rub normal S1 and S2 Abdomen: Nontender, nondistended, soft, bowel sounds positive, no rebound, no ascites, no appreciable mass Extremities: No significant cyanosis, clubbing, bilateral lower extremities 1-2+ pedal edema, erythema, warm to touch, significant fluctuance, tender to palpation on the outer aspect of the left lower extremity Data Reviewed:        Discharge Instructions    Diet - low sodium heart healthy    Complete by:  As directed      Increase activity slowly    Complete by:  As directed            Follow-up Information    Follow up with Alesia Richards, MD. Schedule an appointment as soon as possible for a visit in 1 week.   Specialty:  Internal Medicine   Contact information:   [address] [place] [address] [phone]       Signed: Reyne Dumas 12/21/2014, 1:14 PM

## 2014-12-21 NOTE — Clinical Social Work Note (Signed)
Pt to be discharged to [facility]. Pt's daughter, [name], updated regarding discharge.  Facility: [facility] Report number: [phone] Transportation: EMS [facility]) scheduled for 2:30pm  Lubertha Sayres, Latanya Presser [phone]) Licensed Clinical Social Worker Orthopedics [phone]) and Surgical [phone])

## 2014-12-21 NOTE — Progress Notes (Signed)
Report was called to [facility].

## 2014-12-21 NOTE — Progress Notes (Signed)
Physical Therapy Treatment Patient Details Name: Alyssa Schmitt MRN: [id number] DOB: 1927/12/19 Today's Date: 12/21/2014    History of Present Illness Patient is a 81 y/o female who presents to the ED with c/o fever, chills, rigors, body aches and pain in her LLE ankle which has had a venous stasis ulcer for the past 2.5 weeks. Patient had been putting triamcinalone on BLE recently, and developed abrupt onset of fever and significant increase of pain at the site of ulcer over night this past evening.  Fever is 102.3 in the ED.     PT Comments    Pt is progressing slowly with her mobility, but she is progressing.  She was able to walk a very short distance with two person max assist and RW today.  She continues to be appropriate for SNF placement at discharge.    Follow Up Recommendations  SNF     Equipment Recommendations  Wheelchair (measurements PT);Wheelchair cushion (measurements PT);Other (comment) (hoyer lift)    Recommendations for Other Services   NA     Precautions / Restrictions Precautions Precautions: Fall Precaution Comments:  h/o falls    Mobility  Bed Mobility Overal bed mobility: Needs Assistance;+2 for physical assistance Bed Mobility: Sit to Supine       Sit to supine: +2 for physical assistance;Max assist   General bed mobility comments: Two person max assist to help lift bil legs and control trunk to transition to supine in bed. Pt is attempting to help, but just too weak to do any component on her own.   Transfers Overall transfer level: Needs assistance Equipment used: Rolling walker (2 wheeled) Transfers: Sit to/from [employer] Sit to Stand: +2 physical assistance;Max assist Stand pivot transfers: +2 physical assistance;Max assist       General transfer comment: Two person max assist to stand and pivot x 2 to [facility] and then to bed from recliner chair. Max verbal and maunal cues for safe hand placement.  Max assist needed to help  pt transition to her feet and then anteriorly weight shift over her feet as she stays posteriorly leaning with feet and knees extended.   Ambulation/Gait Ambulation/Gait assistance: +2 physical assistance;Max assist Ambulation Distance (Feet): 3 Feet Assistive device: Rolling walker (2 wheeled) Gait Pattern/deviations: Step-to pattern;Shuffle;Trunk flexed     General Gait Details: Pt took ~ 6-7 pivotal steps from [facility] to bed with RW.  She needed significant asssit to maintain trunk support over feet during gait.           Balance Overall balance assessment: Needs assistance Sitting-balance support: Feet supported;No upper extremity supported;Bilateral upper extremity supported Sitting balance-Leahy Scale: Poor Sitting balance - Comments: Pt needs min assist to maintain sitting balance EOB.   Postural control: Posterior lean Standing balance support: Bilateral upper extremity supported Standing balance-Leahy Scale: Zero Standing balance comment: max two person assist.                     Cognition Arousal/Alertness: Awake/alert Behavior During Therapy: Anxious Overall Cognitive Status: No family/caregiver present to determine baseline cognitive functioning (not specifically tested during this session)                         General Comments General comments (skin integrity, edema, etc.): Third person needed for peri care after toileting.       Pertinent Vitals/Pain Pain Assessment: Faces Faces Pain Scale: Hurts even more Pain Location: neck, left leg/foot Pain Descriptors /  Indicators: Grimacing;Guarding Pain Intervention(s): Limited activity within patient's tolerance;Monitored during session;Repositioned;Heat applied (heat applied to neck)           PT Goals (current goals can now be found in the care plan section) Acute Rehab PT Goals Patient Stated Goal: to be able to go home  Progress towards PT goals: Progressing toward goals    Frequency   Min 2X/week (due to departmental norms for SNF placement pts)    PT Plan Frequency needs to be updated       End of Session Equipment Utilized During Treatment: Gait belt Activity Tolerance: Patient limited by pain;Patient limited by fatigue Patient left: in bed;with call bell/phone within reach     Time: 1610-9604 PT Time Calculation (min) (ACUTE ONLY): 23 min  Charges:  $Therapeutic Activity: 23-37 mins                     Gardenia Witter B. Pristine Gladhill, PT, DPT [phone]   12/21/2014, 3:10 PM

## 2014-12-25 ENCOUNTER — Ambulatory Visit: Payer: Self-pay | Admitting: Physician Assistant

## 2014-12-25 DIAGNOSIS — I83028 Varicose veins of left lower extremity with ulcer other part of lower leg: Secondary | ICD-10-CM | POA: Diagnosis not present

## 2014-12-25 DIAGNOSIS — I872 Venous insufficiency (chronic) (peripheral): Secondary | ICD-10-CM | POA: Diagnosis not present

## 2014-12-25 DIAGNOSIS — L03116 Cellulitis of left lower limb: Secondary | ICD-10-CM | POA: Diagnosis not present

## 2014-12-25 DIAGNOSIS — N390 Urinary tract infection, site not specified: Secondary | ICD-10-CM | POA: Diagnosis not present

## 2014-12-25 LAB — CULTURE, BLOOD (ROUTINE X 2)
Culture: NO GROWTH
Culture: NO GROWTH

## 2014-12-27 DIAGNOSIS — I872 Venous insufficiency (chronic) (peripheral): Secondary | ICD-10-CM | POA: Diagnosis not present

## 2014-12-27 DIAGNOSIS — I10 Essential (primary) hypertension: Secondary | ICD-10-CM | POA: Diagnosis not present

## 2014-12-27 DIAGNOSIS — L02419 Cutaneous abscess of limb, unspecified: Secondary | ICD-10-CM | POA: Diagnosis not present

## 2014-12-27 DIAGNOSIS — J449 Chronic obstructive pulmonary disease, unspecified: Secondary | ICD-10-CM | POA: Diagnosis not present

## 2014-12-27 DIAGNOSIS — E039 Hypothyroidism, unspecified: Secondary | ICD-10-CM | POA: Diagnosis not present

## 2015-01-02 DIAGNOSIS — L03116 Cellulitis of left lower limb: Secondary | ICD-10-CM | POA: Diagnosis not present

## 2015-01-02 DIAGNOSIS — I83009 Varicose veins of unspecified lower extremity with ulcer of unspecified site: Secondary | ICD-10-CM | POA: Diagnosis not present

## 2015-01-02 DIAGNOSIS — I872 Venous insufficiency (chronic) (peripheral): Secondary | ICD-10-CM | POA: Diagnosis not present

## 2015-01-02 DIAGNOSIS — M6281 Muscle weakness (generalized): Secondary | ICD-10-CM | POA: Diagnosis not present

## 2015-01-08 DIAGNOSIS — M6281 Muscle weakness (generalized): Secondary | ICD-10-CM | POA: Diagnosis not present

## 2015-01-08 DIAGNOSIS — I83005 Varicose veins of unspecified lower extremity with ulcer other part of foot: Secondary | ICD-10-CM | POA: Diagnosis not present

## 2015-01-08 DIAGNOSIS — L03126 Acute lymphangitis of left lower limb: Secondary | ICD-10-CM | POA: Diagnosis not present

## 2015-01-08 DIAGNOSIS — I872 Venous insufficiency (chronic) (peripheral): Secondary | ICD-10-CM | POA: Diagnosis not present

## 2015-01-11 DIAGNOSIS — L03116 Cellulitis of left lower limb: Secondary | ICD-10-CM

## 2015-01-11 DIAGNOSIS — N390 Urinary tract infection, site not specified: Secondary | ICD-10-CM

## 2015-01-11 DIAGNOSIS — R2689 Other abnormalities of gait and mobility: Secondary | ICD-10-CM

## 2015-01-11 DIAGNOSIS — M6281 Muscle weakness (generalized): Secondary | ICD-10-CM

## 2015-01-11 DIAGNOSIS — R279 Unspecified lack of coordination: Secondary | ICD-10-CM | POA: Diagnosis not present

## 2015-01-14 DIAGNOSIS — N390 Urinary tract infection, site not specified: Secondary | ICD-10-CM | POA: Diagnosis not present

## 2015-01-14 DIAGNOSIS — R279 Unspecified lack of coordination: Secondary | ICD-10-CM | POA: Diagnosis not present

## 2015-01-14 DIAGNOSIS — R2689 Other abnormalities of gait and mobility: Secondary | ICD-10-CM | POA: Diagnosis not present

## 2015-01-14 DIAGNOSIS — L03116 Cellulitis of left lower limb: Secondary | ICD-10-CM | POA: Diagnosis not present

## 2015-01-14 DIAGNOSIS — M6281 Muscle weakness (generalized): Secondary | ICD-10-CM | POA: Diagnosis not present

## 2015-01-15 DIAGNOSIS — L03116 Cellulitis of left lower limb: Secondary | ICD-10-CM | POA: Diagnosis not present

## 2015-01-15 DIAGNOSIS — R2689 Other abnormalities of gait and mobility: Secondary | ICD-10-CM | POA: Diagnosis not present

## 2015-01-15 DIAGNOSIS — N390 Urinary tract infection, site not specified: Secondary | ICD-10-CM | POA: Diagnosis not present

## 2015-01-15 DIAGNOSIS — M6281 Muscle weakness (generalized): Secondary | ICD-10-CM | POA: Diagnosis not present

## 2015-01-15 DIAGNOSIS — R279 Unspecified lack of coordination: Secondary | ICD-10-CM | POA: Diagnosis not present

## 2015-01-16 ENCOUNTER — Encounter: Payer: Self-pay | Admitting: Internal Medicine

## 2015-01-16 ENCOUNTER — Ambulatory Visit (INDEPENDENT_AMBULATORY_CARE_PROVIDER_SITE_OTHER): Payer: Medicare Other | Admitting: Internal Medicine

## 2015-01-16 VITALS — BP 140/76 | HR 80 | Temp 97.9°F | Resp 16 | Ht 61.0 in | Wt 169.8 lb

## 2015-01-16 DIAGNOSIS — I10 Essential (primary) hypertension: Secondary | ICD-10-CM | POA: Diagnosis not present

## 2015-01-16 DIAGNOSIS — L03116 Cellulitis of left lower limb: Secondary | ICD-10-CM

## 2015-01-16 DIAGNOSIS — Z79899 Other long term (current) drug therapy: Secondary | ICD-10-CM | POA: Diagnosis not present

## 2015-01-16 LAB — BASIC METABOLIC PANEL WITH GFR
BUN: 18 mg/dL (ref 6–23)
CO2: 29 mEq/L (ref 19–32)
Calcium: 9.5 mg/dL (ref 8.4–10.5)
Chloride: 101 mEq/L (ref 96–112)
Creat: 0.99 mg/dL (ref 0.50–1.10)
GFR, Est African American: 61 mL/min
GFR, Est Non African American: 52 mL/min — ABNORMAL LOW
Glucose, Bld: 80 mg/dL (ref 70–99)
Potassium: 4.4 mEq/L (ref 3.5–5.3)
Sodium: 140 mEq/L (ref 135–145)

## 2015-01-16 LAB — CBC WITH DIFFERENTIAL/PLATELET
Basophils Absolute: 0 10*3/uL (ref 0.0–0.1)
Basophils Relative: 1 % (ref 0–1)
Eosinophils Absolute: 0.1 10*3/uL (ref 0.0–0.7)
Eosinophils Relative: 3 % (ref 0–5)
HCT: 39.7 % (ref 36.0–46.0)
Hemoglobin: 12.9 g/dL (ref 12.0–15.0)
Lymphocytes Relative: 30 % (ref 12–46)
Lymphs Abs: 1.4 10*3/uL (ref 0.7–4.0)
MCH: 29.3 pg (ref 26.0–34.0)
MCHC: 32.5 g/dL (ref 30.0–36.0)
MCV: 90 fL (ref 78.0–100.0)
MPV: 10.5 fL (ref 8.6–12.4)
Monocytes Absolute: 0.6 10*3/uL (ref 0.1–1.0)
Monocytes Relative: 13 % — ABNORMAL HIGH (ref 3–12)
Neutro Abs: 2.5 10*3/uL (ref 1.7–7.7)
Neutrophils Relative %: 53 % (ref 43–77)
Platelets: 199 10*3/uL (ref 150–400)
RBC: 4.41 MIL/uL (ref 3.87–5.11)
RDW: 14 % (ref 11.5–15.5)
WBC: 4.7 10*3/uL (ref 4.0–10.5)

## 2015-01-16 NOTE — Progress Notes (Signed)
Subjective:    Patient ID: Alyssa Schmitt, female    DOB: February 03, 1928, 81 y.o.   MRN: [id number]  HPI Patient was hospitalized Jan 9-15 with cellulitis of the left ankle and treated with Vancomycin & then d/c'd to [facility] for gait therapy and completion of Abx therapy. Since home, patient's been doing well with compression stockings maintaining worsening of edema as well as leg elevation. Denies fever/chills, cardiac, respiratory or GI sx's.   Medication Sig  . acetaminophen (TYLENOL) 325 MG tablet Take 2 tablets (650 mg total) by mouth every 6 (six) hours as needed (prn TEMP>38.1 Celsius).  . busPIRone (BUSPAR) 10 MG tablet TAKE 1/2 - 1 TABLET 3 TIMES A DAY AS NEEDED FOR ANXIETY  . calcium-vitamin D (OSCAL WITH D) 250-125 MG-UNIT per tablet Take 1 tablet by mouth daily.  . Cholecalciferol (VITAMIN D3) 2000 UNITS TABS Take 4 tablets by mouth daily.   . citalopram (CELEXA) 20 MG tablet 1/2-1 pill daily  . fexofenadine (ALLEGRA) 180 MG tablet Take 180 mg by mouth daily.   [name] Kitchen levothyroxine (SYNTHROID, LEVOTHROID) 50 MCG tablet TAKE 1 TABLET BY MOUTH DAILY  . magnesium oxide (MAG-OX) 400 (241.3 MG) MG tablet Take 1 tablet (400 mg total) by mouth 2 (two) times daily.  . Multiple Vitamin (MULITIVITAMIN WITH MINERALS) TABS Take 1 tablet by mouth daily.  [name] Kitchen omeprazole (PRILOSEC) 40 MG capsule TAKE 1 CAPSULE DAILY  . Probiotic Product (PROBIOTIC FORMULA PO) Take 1 tablet by mouth daily.   [name] Kitchen terazosin (HYTRIN) 10 MG capsule Take 1 capsule (10 mg total) by mouth daily.  [name] Kitchen triamcinolone cream (KENALOG) 0.1 % Apply 1 application topically 3 (three) times daily.  . bisacodyl (DULCOLAX) 5 MG EC tablet Take 1 tablet (5 mg total) by mouth 2 (two) times daily.  [name] Kitchen HYDROcodone-acetaminophen (VICODIN) 5-500 MG per tablet Take 1 tablet by mouth every 6 (six) hours as needed for pain.  . methocarbamol (ROBAXIN) 500 MG tablet Take 0.5 tablets (250 mg total) by mouth every 6 (six) hours as needed for muscle  spasms.  [name] Kitchen saccharomyces boulardii (FLORASTOR) 250 MG capsule Take 1 capsule (250 mg total) by mouth 2 (two) times daily.  . vancomycin 1,750 mg in sodium chloride 0.9 % 500 mL Inject 1,750 mg into the vein daily.   Allergies  Allergen Reactions  . Aspirin Swelling  . Celebrex [Celecoxib] Other (See Comments)    "my body lit up"  . Fosamax [Alendronate Sodium] Other (See Comments)    unknown  . Keflex [Cephalexin] Other (See Comments)    unknown  . Penicillins Other (See Comments)    unknown  . Prednisone Itching  . Prevacid [Lansoprazole] Other (See Comments)    unknown  . Sulfa Antibiotics Other (See Comments)    unknown  . Tetanus Toxoids Other (See Comments)    unknown  . Tetracyclines & Related Nausea Only    Nausea    Past Medical History  Diagnosis Date  . Abnormal EKG     LBBB  . H/O echocardiogram   . Ulcer   . Allergy   . Arthritis   . Hypertension   . Thyroid disease   . Hyperlipidemia   . GERD (gastroesophageal reflux disease)   . Anxiety   . Depression   . COPD (chronic obstructive pulmonary disease)   . Osteoporosis   . Prediabetes   . Venous insufficiency   . Vitamin D deficiency    Past Surgical History  Procedure Laterality Date  . Tonsillectomy  childhood  . Vesicovaginal fistula closure w/ tah     Review of Systems 10 pt systems review negative to above.    Objective:   Physical Exam BP 140/76 mmHg  Pulse 80  Temp(Src) 97.9 F (36.6 C)  Resp 16  Ht 5\' 1"  (1.549 m)  Wt 169 lb 12.8 oz (77.021 kg)  BMI 32.10 kg/m2  HEENT - Eac's patent. TM's Nl. EOM's full. PERRLA. NasoOroPharynx clear. Neck - supple. Nl Thyroid. Carotids 2+ & No bruits, nodes, JVD Chest - Clear equal BS w/o Rales, rhonchi, wheezes. Cor - Nl HS. RRR w/o sig MGR. PP 1(+). No edema. Abd - No palpable organomegaly, masses or tenderness. BS nl. MS- FROM w/o deformities. Muscle power, tone and bulk Nl. Gait Nl. Neuro - No obvious Cr N abnormalities. Sensory, motor  and Cerebellar functions appear Nl w/o focal abnormalities. Psyche - Mental status normal & appropriate.  No delusions, ideations or obvious mood abnormalities. Skin - Moderate venous stasis reddish brown hemosiderin pigmentation of the distal 1/2 LE's w/o tenderness or warmth to suggest infection.    Assessment & Plan:   1. Essential hypertension   2. Cellulitis of left lower extremity, Treated  3. Medication management  - CBC with Differential/Platelet - BASIC METABOLIC PANEL WITH GFR

## 2015-01-17 DIAGNOSIS — L03116 Cellulitis of left lower limb: Secondary | ICD-10-CM | POA: Diagnosis not present

## 2015-01-17 DIAGNOSIS — R2689 Other abnormalities of gait and mobility: Secondary | ICD-10-CM | POA: Diagnosis not present

## 2015-01-17 DIAGNOSIS — M6281 Muscle weakness (generalized): Secondary | ICD-10-CM | POA: Diagnosis not present

## 2015-01-17 DIAGNOSIS — N390 Urinary tract infection, site not specified: Secondary | ICD-10-CM | POA: Diagnosis not present

## 2015-01-17 DIAGNOSIS — R279 Unspecified lack of coordination: Secondary | ICD-10-CM | POA: Diagnosis not present

## 2015-01-18 DIAGNOSIS — M6281 Muscle weakness (generalized): Secondary | ICD-10-CM | POA: Diagnosis not present

## 2015-01-18 DIAGNOSIS — L03116 Cellulitis of left lower limb: Secondary | ICD-10-CM | POA: Diagnosis not present

## 2015-01-18 DIAGNOSIS — N390 Urinary tract infection, site not specified: Secondary | ICD-10-CM | POA: Diagnosis not present

## 2015-01-18 DIAGNOSIS — R2689 Other abnormalities of gait and mobility: Secondary | ICD-10-CM | POA: Diagnosis not present

## 2015-01-18 DIAGNOSIS — R279 Unspecified lack of coordination: Secondary | ICD-10-CM | POA: Diagnosis not present

## 2015-01-20 DIAGNOSIS — L03116 Cellulitis of left lower limb: Secondary | ICD-10-CM | POA: Diagnosis not present

## 2015-01-20 DIAGNOSIS — N390 Urinary tract infection, site not specified: Secondary | ICD-10-CM | POA: Diagnosis not present

## 2015-01-20 DIAGNOSIS — R2689 Other abnormalities of gait and mobility: Secondary | ICD-10-CM | POA: Diagnosis not present

## 2015-01-20 DIAGNOSIS — M6281 Muscle weakness (generalized): Secondary | ICD-10-CM | POA: Diagnosis not present

## 2015-01-20 DIAGNOSIS — R279 Unspecified lack of coordination: Secondary | ICD-10-CM | POA: Diagnosis not present

## 2015-01-22 DIAGNOSIS — L03116 Cellulitis of left lower limb: Secondary | ICD-10-CM | POA: Diagnosis not present

## 2015-01-22 DIAGNOSIS — R279 Unspecified lack of coordination: Secondary | ICD-10-CM | POA: Diagnosis not present

## 2015-01-22 DIAGNOSIS — M6281 Muscle weakness (generalized): Secondary | ICD-10-CM | POA: Diagnosis not present

## 2015-01-22 DIAGNOSIS — N390 Urinary tract infection, site not specified: Secondary | ICD-10-CM | POA: Diagnosis not present

## 2015-01-22 DIAGNOSIS — R2689 Other abnormalities of gait and mobility: Secondary | ICD-10-CM | POA: Diagnosis not present

## 2015-01-23 DIAGNOSIS — R279 Unspecified lack of coordination: Secondary | ICD-10-CM | POA: Diagnosis not present

## 2015-01-23 DIAGNOSIS — M6281 Muscle weakness (generalized): Secondary | ICD-10-CM | POA: Diagnosis not present

## 2015-01-23 DIAGNOSIS — R2689 Other abnormalities of gait and mobility: Secondary | ICD-10-CM | POA: Diagnosis not present

## 2015-01-23 DIAGNOSIS — N390 Urinary tract infection, site not specified: Secondary | ICD-10-CM | POA: Diagnosis not present

## 2015-01-23 DIAGNOSIS — L03116 Cellulitis of left lower limb: Secondary | ICD-10-CM | POA: Diagnosis not present

## 2015-01-24 DIAGNOSIS — M6281 Muscle weakness (generalized): Secondary | ICD-10-CM | POA: Diagnosis not present

## 2015-01-24 DIAGNOSIS — N390 Urinary tract infection, site not specified: Secondary | ICD-10-CM | POA: Diagnosis not present

## 2015-01-24 DIAGNOSIS — R279 Unspecified lack of coordination: Secondary | ICD-10-CM | POA: Diagnosis not present

## 2015-01-24 DIAGNOSIS — M79609 Pain in unspecified limb: Secondary | ICD-10-CM | POA: Diagnosis not present

## 2015-01-24 DIAGNOSIS — R2689 Other abnormalities of gait and mobility: Secondary | ICD-10-CM | POA: Diagnosis not present

## 2015-01-24 DIAGNOSIS — L03116 Cellulitis of left lower limb: Secondary | ICD-10-CM | POA: Diagnosis not present

## 2015-01-24 DIAGNOSIS — B351 Tinea unguium: Secondary | ICD-10-CM | POA: Diagnosis not present

## 2015-01-25 DIAGNOSIS — R279 Unspecified lack of coordination: Secondary | ICD-10-CM | POA: Diagnosis not present

## 2015-01-25 DIAGNOSIS — L03116 Cellulitis of left lower limb: Secondary | ICD-10-CM | POA: Diagnosis not present

## 2015-01-25 DIAGNOSIS — R2689 Other abnormalities of gait and mobility: Secondary | ICD-10-CM | POA: Diagnosis not present

## 2015-01-25 DIAGNOSIS — N390 Urinary tract infection, site not specified: Secondary | ICD-10-CM | POA: Diagnosis not present

## 2015-01-25 DIAGNOSIS — M6281 Muscle weakness (generalized): Secondary | ICD-10-CM | POA: Diagnosis not present

## 2015-01-28 DIAGNOSIS — L03116 Cellulitis of left lower limb: Secondary | ICD-10-CM | POA: Diagnosis not present

## 2015-01-28 DIAGNOSIS — R279 Unspecified lack of coordination: Secondary | ICD-10-CM | POA: Diagnosis not present

## 2015-01-28 DIAGNOSIS — R2689 Other abnormalities of gait and mobility: Secondary | ICD-10-CM | POA: Diagnosis not present

## 2015-01-28 DIAGNOSIS — N390 Urinary tract infection, site not specified: Secondary | ICD-10-CM | POA: Diagnosis not present

## 2015-01-28 DIAGNOSIS — M6281 Muscle weakness (generalized): Secondary | ICD-10-CM | POA: Diagnosis not present

## 2015-01-29 DIAGNOSIS — M6281 Muscle weakness (generalized): Secondary | ICD-10-CM | POA: Diagnosis not present

## 2015-01-29 DIAGNOSIS — N390 Urinary tract infection, site not specified: Secondary | ICD-10-CM | POA: Diagnosis not present

## 2015-01-29 DIAGNOSIS — R279 Unspecified lack of coordination: Secondary | ICD-10-CM | POA: Diagnosis not present

## 2015-01-29 DIAGNOSIS — R2689 Other abnormalities of gait and mobility: Secondary | ICD-10-CM | POA: Diagnosis not present

## 2015-01-29 DIAGNOSIS — L03116 Cellulitis of left lower limb: Secondary | ICD-10-CM | POA: Diagnosis not present

## 2015-01-31 DIAGNOSIS — L03116 Cellulitis of left lower limb: Secondary | ICD-10-CM | POA: Diagnosis not present

## 2015-01-31 DIAGNOSIS — M6281 Muscle weakness (generalized): Secondary | ICD-10-CM | POA: Diagnosis not present

## 2015-01-31 DIAGNOSIS — N390 Urinary tract infection, site not specified: Secondary | ICD-10-CM | POA: Diagnosis not present

## 2015-01-31 DIAGNOSIS — R279 Unspecified lack of coordination: Secondary | ICD-10-CM | POA: Diagnosis not present

## 2015-01-31 DIAGNOSIS — R2689 Other abnormalities of gait and mobility: Secondary | ICD-10-CM | POA: Diagnosis not present

## 2015-02-01 DIAGNOSIS — N390 Urinary tract infection, site not specified: Secondary | ICD-10-CM | POA: Diagnosis not present

## 2015-02-01 DIAGNOSIS — R279 Unspecified lack of coordination: Secondary | ICD-10-CM | POA: Diagnosis not present

## 2015-02-01 DIAGNOSIS — R2689 Other abnormalities of gait and mobility: Secondary | ICD-10-CM | POA: Diagnosis not present

## 2015-02-01 DIAGNOSIS — M6281 Muscle weakness (generalized): Secondary | ICD-10-CM | POA: Diagnosis not present

## 2015-02-01 DIAGNOSIS — L03116 Cellulitis of left lower limb: Secondary | ICD-10-CM | POA: Diagnosis not present

## 2015-02-04 DIAGNOSIS — N390 Urinary tract infection, site not specified: Secondary | ICD-10-CM | POA: Diagnosis not present

## 2015-02-04 DIAGNOSIS — R2689 Other abnormalities of gait and mobility: Secondary | ICD-10-CM | POA: Diagnosis not present

## 2015-02-04 DIAGNOSIS — M6281 Muscle weakness (generalized): Secondary | ICD-10-CM | POA: Diagnosis not present

## 2015-02-04 DIAGNOSIS — L03116 Cellulitis of left lower limb: Secondary | ICD-10-CM | POA: Diagnosis not present

## 2015-02-04 DIAGNOSIS — R279 Unspecified lack of coordination: Secondary | ICD-10-CM | POA: Diagnosis not present

## 2015-02-05 ENCOUNTER — Telehealth: Payer: Self-pay | Admitting: *Deleted

## 2015-02-05 NOTE — Telephone Encounter (Signed)
OK to extend PT 2 more weeks at 2 times a week per Dr Melford Aase.

## 2015-02-06 ENCOUNTER — Telehealth: Payer: Self-pay | Admitting: *Deleted

## 2015-02-06 NOTE — Telephone Encounter (Signed)
Left message for [facility] therapist that it is OK for home health aid to see patient 2 times a week x 2 more weeks to assist patient with her shower.

## 2015-02-07 ENCOUNTER — Encounter: Payer: Self-pay | Admitting: Internal Medicine

## 2015-02-07 ENCOUNTER — Ambulatory Visit (INDEPENDENT_AMBULATORY_CARE_PROVIDER_SITE_OTHER): Payer: Medicare Other | Admitting: Internal Medicine

## 2015-02-07 VITALS — BP 114/72 | HR 84 | Temp 97.5°F | Resp 16 | Ht 61.0 in | Wt 171.8 lb

## 2015-02-07 DIAGNOSIS — I10 Essential (primary) hypertension: Secondary | ICD-10-CM | POA: Diagnosis not present

## 2015-02-07 DIAGNOSIS — R7309 Other abnormal glucose: Secondary | ICD-10-CM | POA: Diagnosis not present

## 2015-02-07 DIAGNOSIS — R2689 Other abnormalities of gait and mobility: Secondary | ICD-10-CM | POA: Diagnosis not present

## 2015-02-07 DIAGNOSIS — M6281 Muscle weakness (generalized): Secondary | ICD-10-CM | POA: Diagnosis not present

## 2015-02-07 DIAGNOSIS — Z0000 Encounter for general adult medical examination without abnormal findings: Secondary | ICD-10-CM

## 2015-02-07 DIAGNOSIS — R279 Unspecified lack of coordination: Secondary | ICD-10-CM | POA: Diagnosis not present

## 2015-02-07 DIAGNOSIS — R7303 Prediabetes: Secondary | ICD-10-CM

## 2015-02-07 DIAGNOSIS — E785 Hyperlipidemia, unspecified: Secondary | ICD-10-CM

## 2015-02-07 DIAGNOSIS — E559 Vitamin D deficiency, unspecified: Secondary | ICD-10-CM | POA: Diagnosis not present

## 2015-02-07 DIAGNOSIS — L03116 Cellulitis of left lower limb: Secondary | ICD-10-CM | POA: Diagnosis not present

## 2015-02-07 DIAGNOSIS — Z79899 Other long term (current) drug therapy: Secondary | ICD-10-CM | POA: Diagnosis not present

## 2015-02-07 DIAGNOSIS — N390 Urinary tract infection, site not specified: Secondary | ICD-10-CM | POA: Diagnosis not present

## 2015-02-07 LAB — CBC WITH DIFFERENTIAL/PLATELET
Basophils Absolute: 0 10*3/uL (ref 0.0–0.1)
Basophils Relative: 1 % (ref 0–1)
Eosinophils Absolute: 0.1 10*3/uL (ref 0.0–0.7)
Eosinophils Relative: 3 % (ref 0–5)
HCT: 37.6 % (ref 36.0–46.0)
Hemoglobin: 12.3 g/dL (ref 12.0–15.0)
Lymphocytes Relative: 45 % (ref 12–46)
Lymphs Abs: 1.8 10*3/uL (ref 0.7–4.0)
MCH: 29.6 pg (ref 26.0–34.0)
MCHC: 32.7 g/dL (ref 30.0–36.0)
MCV: 90.4 fL (ref 78.0–100.0)
MPV: 10 fL (ref 8.6–12.4)
Monocytes Absolute: 0.4 10*3/uL (ref 0.1–1.0)
Monocytes Relative: 10 % (ref 3–12)
Neutro Abs: 1.7 10*3/uL (ref 1.7–7.7)
Neutrophils Relative %: 41 % — ABNORMAL LOW (ref 43–77)
Platelets: 180 10*3/uL (ref 150–400)
RBC: 4.16 MIL/uL (ref 3.87–5.11)
RDW: 14 % (ref 11.5–15.5)
WBC: 4.1 10*3/uL (ref 4.0–10.5)

## 2015-02-07 LAB — HEMOGLOBIN A1C
Hgb A1c MFr Bld: 5.6 % (ref <5.7)
Mean Plasma Glucose: 114 mg/dL (ref <117)

## 2015-02-07 NOTE — Patient Instructions (Signed)

## 2015-02-07 NOTE — Progress Notes (Signed)
Patient ID: Alyssa Schmitt, female   DOB: Sep 18, 1928, 81 y.o.   MRN: [id number]  MEDICARE ANNUAL WELLNESS VISIT AND OV  Assessment:   1. Essential hypertension - cont home meds - encouraged monitoring BP at home - continue DASH diet - TSH  2. Hyperlipidemia -Cont diet and exercise - Lipid panel  3. Prediabetes -cont diet and exercise - Hemoglobin A1c - Insulin, fasting  4. Vitamin D deficiency -cont supplementation - Vit D  25 hydroxy (rtn osteoporosis monitoring)  5. Medication management - CBC with Differential/Platelet - BASIC METABOLIC PANEL WITH GFR - Hepatic function panel - Magnesium  Patient offered Prevnar per reccomendations.  We have discussed the benefits of the vaccine and she has declined it at this visit.  She would like to have some information and would like to think about it.   Recommended regular exercise, BP monitoring, weight control, and discussed med and SE's. Recommended labs to assess and monitor clinical status. Further disposition pending results of labs.    Plan:   During the course of the visit the patient was educated and counseled about appropriate screening and preventive services including:    Pneumococcal vaccine   Influenza vaccine  Td vaccine  Screening electrocardiogram  Bone densitometry screening  Colorectal cancer screening  Diabetes screening  Glaucoma screening  Nutrition counseling   Advanced directives: requested  Screening recommendations, referrals: Vaccinations:  Immunization History  Administered Date(s) Administered  . Influenza, High Dose Seasonal PF 09/05/2014  . Pneumococcal Polysaccharide-23 12/18/2007  . Zoster 07/25/2013    Tdap vaccine declined Influenza vaccine not indicated Pneumococcal vaccine not indicated Prevnar vaccine declined Shingles vaccine not indicated Hep B vaccine undecided  Nutrition assessed and recommended  Colonoscopy not indicated Recommended yearly  ophthalmology/optometry visit for glaucoma screening and checkup, Patient to see her eye doctor soon Recommended yearly dental visit for hygiene and checkup Advanced directives - not indicated, patient already has  Conditions/risks identified: BMI: Discussed weight loss, diet, and increase physical activity.  Increase physical activity: AHA recommends 150 minutes of physical activity a week.  Medications reviewed Diabetes is at goal, ACE/ARB therapy: No, Reason not on Ace Inhibitor/ARB therapy:  not indicated at this time Urinary Incontinence is not an issue: discussed non pharmacology and pharmacology options.  Fall risk: low- discussed PT, home fall assessment, medications.   Subjective:   Alyssa Schmitt  presents for Medicare Annual Wellness Visit and complete physical.  Date of last medicare wellness visit was 2015.  This very nice 81 y.o.female presents for 79 month follow up with Hypertension, Hyperlipidemia, Pre-Diabetes and Vitamin D Deficiency.    Patient is treated for HTN & BP has been controlled at home. Today's BP: 114/72 mmHg. Patient has had no complaints of any cardiac type chest pain, palpitations, dyspnea/orthopnea/PND, dizziness, claudication, or dependent edema.   Hyperlipidemia is controlled with diet & meds. Patient denies myalgias or other med SE's. Last Lipids were Cholesterol, Total 110; HDL-C 47; LDL (calc) 53; Triglycerides 49 respectively on 12/16/2014, and she is at goal.  She is not currently on cholesterol medications.     Also, the patient has history of prediabetes and has had no symptoms of reactive hypoglycemia, diabetic polys, paresthesias or visual blurring.  Last A1c was  5.5% on 08/01/2014:   Further, the patient also has history of Vitamin D Deficiency and supplements vitamin D without any suspected side-effects. Last vitamin D was  74 11/26/15.  She is taking her vitamin D supplement.    Patient is  unsure whether she would like to have prevnar 13.   Patient has no other complaints.  She reports medication compliance.     Medication Sig  . acetaminophen (TYLENOL) 325 MG tablet Take 2 tablets (650 mg total) by mouth every 6 (six) hours as needed (prn TEMP>38.1 Celsius).  . busPIRone (BUSPAR) 10 MG tablet TAKE 1/2 - 1 TABLET 3 TIMES A DAY AS NEEDED FOR ANXIETY  . calcium-vitamin D (OSCAL WITH D) 250-125 MG-UNIT per tablet Take 1 tablet by mouth daily.  . Cholecalciferol (VITAMIN D3) 2000 UNITS TABS Take 4 tablets by mouth daily.   . citalopram (CELEXA) 20 MG tablet 1/2-1 pill daily  . fexofenadine (ALLEGRA) 180 MG tablet Take 180 mg by mouth daily.   [name] Kitchen levothyroxine (SYNTHROID, LEVOTHROID) 50 MCG tablet TAKE 1 TABLET BY MOUTH DAILY  . magnesium oxide (MAG-OX) 400 (241.3 MG) MG tablet Take 1 tablet (400 mg total) by mouth 2 (two) times daily.  . Multiple Vitamin (MULITIVITAMIN WITH MINERALS) TABS Take 1 tablet by mouth daily.  . mupirocin ointment (BACTROBAN) 2 % Apply 1 application topically daily.  [name] Kitchen omeprazole (PRILOSEC) 40 MG capsule TAKE 1 CAPSULE DAILY  . Probiotic Product (PROBIOTIC FORMULA PO) Take 1 tablet by mouth daily.   [name] Kitchen terazosin (HYTRIN) 10 MG capsule Take 1 capsule (10 mg total) by mouth daily.     Names of Other Physician/Practitioners you currently use: 1. [facility] here for primary care 2. Herbert Deaner, eye doctor, last visit 2015 3. Patient could not remember the name, dentist, last visit 2015  Patient Care Team: Unk Pinto, MD as PCP - General (Internal Medicine) Thayer Headings, MD (Cardiology) Unk Pinto, MD as Attending Physician (Internal Medicine) Ardis Hughs, PA-C as Physician Assistant (Physician Assistant) Johna Sheriff, MD as Consulting Physician (Ophthalmology) Mal Misty, MD as Consulting Physician (Vascular Surgery) Despina Hick, MD as Referring Physician (Radiology) Garald Balding, MD as Consulting Physician (Orthopedic Surgery) Jeryl Columbia, MD as Consulting Physician (Gastroenterology)  Medication Review:   Current Problems (verified) Patient Active Problem List   Diagnosis Date Noted  . Essential hypertension   . Cellulitis of right ankle   . HLD (hyperlipidemia)   . Other emphysema   . Other specified hypothyroidism   . Hiatal hernia   . Cellulitis of left ankle 12/15/2014  . Morbid Obesity (BMI 33.7) 08/01/2014  . Medication management 03/20/2014  . Hypertension   . Hyperlipidemia   . Prediabetes   . Vitamin D deficiency   . Varicose veins of lower extremities with other complications 75/30/0511  . Ulcerated varicose veins of leg 04/13/2012  . Leg edema 03/25/2012    Screening Tests Health Maintenance  Topic Date Due  . TETANUS/TDAP  10/08/1949  . COLONOSCOPY  10/17/2010  . INFLUENZA VACCINE  07/08/2015  . DEXA SCAN  Completed  . PNEUMOCOCCAL POLYSACCHARIDE VACCINE AGE 42 AND OVER  Completed  . ZOSTAVAX  Completed    Immunization History  Administered Date(s) Administered  . Influenza, High Dose Seasonal PF 09/05/2014  . Pneumococcal Polysaccharide-23 12/18/2007  . Zoster 07/25/2013    Preventative care: Last colonoscopy: 2001, patient declined any further colonoscopy testing History reviewed: allergies, current medications, past family history, past medical history, past social history, past surgical history and problem list  Risk Factors: Tobacco History  Substance Use Topics  . Smoking status: Never Smoker   . Smokeless tobacco: Never Used  . Alcohol Use: No   She does not  smoke.  Patient is not a former smoker. Are there smokers in your home (other than you)?  No Alcohol Current alcohol use: none  Caffeine Current caffeine use: tea several /day  Exercise Current exercise: upper and lower body exercises  Nutrition/Diet Current diet: in general, a "healthy" diet    Cardiac risk factors: advanced age (older than 48 for men, 20 for women), hypertension, obesity (BMI >= 30  kg/m2) and sedentary lifestyle.  Depression Screen (Note: if answer to either of the following is "Yes", a more complete depression screening is indicated)   Q1: Over the past two weeks, have you felt down, depressed or hopeless? No  Q2: Over the past two weeks, have you felt little interest or pleasure in doing things? No  Have you lost interest or pleasure in daily life? No  Do you often feel hopeless? No  Do you cry easily over simple problems? No  Activities of Daily Living In your present state of health, do you have any difficulty performing the following activities?:  Driving? No Managing money?  No Feeding yourself? No Getting from bed to chair? No Climbing a flight of stairs? No Preparing food and eating?: No Bathing or showering? No Getting dressed: No Getting to the toilet? No Using the toilet:No Moving around from place to place: No In the past year have you fallen or had a near fall?:No   Are you sexually active?  No  Do you have more than one partner?  No  Vision Difficulties: No  Hearing Difficulties: No Do you often ask people to speak up or repeat themselves? No Do you experience ringing or noises in your ears? No Do you have difficulty understanding soft or whispered voices? Sometimes.  Cognition  Do you feel that you have a problem with memory?No  Do you often misplace items? No  Do you feel safe at home?  Yes  Advanced directives Does patient have a [place]? Yes Does patient have a Living Will? Yes   ROS: Constitutional: Denies fever, chills, weight loss/gain, headaches, insomnia, fatigue, night sweats, and change in appetite. Eyes: Denies redness, blurred vision, diplopia, discharge, itchy, watery eyes.  ENT: Denies discharge, congestion, post nasal drip, epistaxis, sore throat, earache, hearing loss, dental pain, Tinnitus, Vertigo, Sinus pain, snoring.  Cardio: Denies chest pain, palpitations, irregular heartbeat, syncope,  dyspnea, diaphoresis, orthopnea, PND, claudication, edema Respiratory: denies cough, dyspnea, DOE, pleurisy, hoarseness, laryngitis, wheezing.  Gastrointestinal: Denies dysphagia, heartburn, reflux, water brash, pain, cramps, nausea, vomiting, bloating, diarrhea, constipation, hematemesis, melena, hematochezia, jaundice, hemorrhoids Genitourinary: Denies dysuria, frequency, urgency, nocturia, hesitancy, discharge, hematuria, flank pain Breast: Breast lumps, nipple discharge, bleeding.  Musculoskeletal: Denies arthralgia, myalgia, stiffness, Jt. Swelling, pain, limp, and strain/sprain. Denies falls. Skin: Denies puritis, rash, hives, warts, acne, eczema, changing in skin lesion Neuro: No weakness, tremor, incoordination, spasms, paresthesia, pain Psychiatric: Denies confusion, memory loss, sensory loss. Denies Depression. Endocrine: Denies change in weight, skin, hair change, nocturia, and paresthesia, diabetic polys, visual blurring, hyper / hypo glycemic episodes.  Heme/Lymph: No excessive bleeding, bruising, enlarged lymph nodes  Objective:     BP 114/72 mmHg  Pulse 84  Temp(Src) 97.5 F (36.4 C)  Resp 16  Ht 5\' 1"  (1.549 m)  Wt 171 lb 12.8 oz (77.928 kg)  BMI 32.48 kg/m2  General Appearance: Well nourished, alert, WD/WN, femaleand in no apparent distress. Eyes: PERRLA, EOMs, conjunctiva no swelling or erythema, normal fundi and vessels. Sinuses: No frontal/maxillary tenderness ENT/Mouth: EACs patent / TMs  nl. Nares clear without erythema, swelling, mucoid exudates. Oral hygiene is good. No erythema, swelling, or exudate. Tongue normal, non-obstructing. Tonsils not swollen or erythematous. Hearing normal.  Neck: Supple, thyroid normal. No bruits, nodes or JVD. Respiratory: Respiratory effort normal.  BS equal and clear bilateral without rales, rhonci, wheezing or stridor. Cardio: regular rate and rhythm with soft blowing murmur III/VI heard best at left 2nd ICS, no gallops, clicks, or  rubs. Peripheral pulses normal and equal  without edema. Bilateral venous stasis changes to mid calf.  No calf swelling or tenderness. Peripheral pulses are normal and equal bilaterally without edema. No aortic or femoral bruits. Ted hose in place Chest: symmetric with normal excursions and percussion. Breasts: Symmetric, without lumps, nipple discharge, retractions, or fibrocystic changes.  Abdomen: Flat, soft  with nl bowel sounds. Nontender, no guarding, rebound, hernias, masses, or organomegaly.  Lymphatics: Non tender without lymphadenopathy.  Genitourinary:  Musculoskeletal: Full ROM all peripheral extremities, joint stability, 5/5 strength, and normal gait. Skin: Warm and dry without rashes, lesions, cyanosis, clubbing or  ecchymosis.  Neuro: Cranial nerves intact, reflexes equal bilaterally. Normal muscle tone, no cerebellar symptoms. Sensation intact.  Pysch: Awake and oriented X 3, normal affect, Insight and Judgment appropriate.   Cognitive Testing  Alert? Yes  Normal Appearance?Yes  Oriented to person? Yes  Place? Yes   Time? Yes  Recall of three objects?  Yes  Can perform simple calculations? Yes  Displays appropriate judgment? Yes  Can read the correct time from a watch/clock?Yes  Medicare Attestation I have personally reviewed: The patient's medical and social history Their use of alcohol, tobacco or illicit drugs Their current medications and supplements The patient's functional ability including ADLs,fall risks, home safety risks, cognitive, and hearing and visual impairment Diet and physical activities Evidence for depression or mood disorders  The patient's weight, height, BMI, and visual acuity have been recorded in the chart.  I have made referrals, counseling, and provided education to the patient based on review of the above and I have provided the patient with a written personalized care plan for preventive services.    FORCUCCI, COURTNEY, PA-C   02/07/2015    Allergies  Allergen Reactions  . Aspirin Swelling  . Celebrex [Celecoxib] Other (See Comments)    "my body lit up"  . Fosamax [Alendronate Sodium] Other (See Comments)    unknown  . Keflex [Cephalexin] Other (See Comments)    unknown  . Penicillins Other (See Comments)    unknown  . Prednisone Itching  . Prevacid [Lansoprazole] Other (See Comments)    unknown  . Sulfa Antibiotics Other (See Comments)    unknown  . Tetanus Toxoids Other (See Comments)    unknown  . Tetracyclines & Related Nausea Only    Nausea

## 2015-02-08 DIAGNOSIS — L03116 Cellulitis of left lower limb: Secondary | ICD-10-CM | POA: Diagnosis not present

## 2015-02-08 DIAGNOSIS — N390 Urinary tract infection, site not specified: Secondary | ICD-10-CM | POA: Diagnosis not present

## 2015-02-08 DIAGNOSIS — R279 Unspecified lack of coordination: Secondary | ICD-10-CM | POA: Diagnosis not present

## 2015-02-08 DIAGNOSIS — M6281 Muscle weakness (generalized): Secondary | ICD-10-CM | POA: Diagnosis not present

## 2015-02-08 DIAGNOSIS — R2689 Other abnormalities of gait and mobility: Secondary | ICD-10-CM | POA: Diagnosis not present

## 2015-02-08 LAB — BASIC METABOLIC PANEL WITH GFR
BUN: 19 mg/dL (ref 6–23)
CO2: 29 mEq/L (ref 19–32)
Calcium: 9.7 mg/dL (ref 8.4–10.5)
Chloride: 101 mEq/L (ref 96–112)
Creat: 1.02 mg/dL (ref 0.50–1.10)
GFR, Est African American: 58 mL/min — ABNORMAL LOW
GFR, Est Non African American: 51 mL/min — ABNORMAL LOW
Glucose, Bld: 91 mg/dL (ref 70–99)
Potassium: 4.3 mEq/L (ref 3.5–5.3)
Sodium: 136 mEq/L (ref 135–145)

## 2015-02-08 LAB — LIPID PANEL
Cholesterol: 142 mg/dL (ref 0–200)
HDL: 49 mg/dL (ref >=46)
LDL Cholesterol: 68 mg/dL (ref 0–99)
Total CHOL/HDL Ratio: 2.9 Ratio
Triglycerides: 126 mg/dL (ref <150)
VLDL: 25 mg/dL (ref 0–40)

## 2015-02-08 LAB — TSH: TSH: 3.299 u[IU]/mL (ref 0.350–4.500)

## 2015-02-08 LAB — HEPATIC FUNCTION PANEL
ALT: 18 U/L (ref 0–35)
AST: 32 U/L (ref 0–37)
Albumin: 4.1 g/dL (ref 3.5–5.2)
Alkaline Phosphatase: 72 U/L (ref 39–117)
Bilirubin, Direct: 0.1 mg/dL (ref 0.0–0.3)
Indirect Bilirubin: 0.4 mg/dL (ref 0.2–1.2)
Total Bilirubin: 0.5 mg/dL (ref 0.2–1.2)
Total Protein: 7.3 g/dL (ref 6.0–8.3)

## 2015-02-08 LAB — INSULIN, FASTING: Insulin fasting, serum: 20.2 u[IU]/mL — ABNORMAL HIGH (ref 2.0–19.6)

## 2015-02-08 LAB — VITAMIN D 25 HYDROXY (VIT D DEFICIENCY, FRACTURES): Vit D, 25-Hydroxy: 87 ng/mL (ref 30–100)

## 2015-02-08 LAB — MAGNESIUM: Magnesium: 2 mg/dL (ref 1.5–2.5)

## 2015-02-13 DIAGNOSIS — L03116 Cellulitis of left lower limb: Secondary | ICD-10-CM | POA: Diagnosis not present

## 2015-02-13 DIAGNOSIS — R279 Unspecified lack of coordination: Secondary | ICD-10-CM | POA: Diagnosis not present

## 2015-02-13 DIAGNOSIS — M6281 Muscle weakness (generalized): Secondary | ICD-10-CM | POA: Diagnosis not present

## 2015-02-13 DIAGNOSIS — R2689 Other abnormalities of gait and mobility: Secondary | ICD-10-CM | POA: Diagnosis not present

## 2015-02-13 DIAGNOSIS — N390 Urinary tract infection, site not specified: Secondary | ICD-10-CM | POA: Diagnosis not present

## 2015-02-14 DIAGNOSIS — L03116 Cellulitis of left lower limb: Secondary | ICD-10-CM | POA: Diagnosis not present

## 2015-02-14 DIAGNOSIS — M6281 Muscle weakness (generalized): Secondary | ICD-10-CM | POA: Diagnosis not present

## 2015-02-14 DIAGNOSIS — R2689 Other abnormalities of gait and mobility: Secondary | ICD-10-CM | POA: Diagnosis not present

## 2015-02-14 DIAGNOSIS — R279 Unspecified lack of coordination: Secondary | ICD-10-CM | POA: Diagnosis not present

## 2015-02-14 DIAGNOSIS — N390 Urinary tract infection, site not specified: Secondary | ICD-10-CM | POA: Diagnosis not present

## 2015-02-20 DIAGNOSIS — L03116 Cellulitis of left lower limb: Secondary | ICD-10-CM | POA: Diagnosis not present

## 2015-02-20 DIAGNOSIS — M6281 Muscle weakness (generalized): Secondary | ICD-10-CM | POA: Diagnosis not present

## 2015-02-20 DIAGNOSIS — R279 Unspecified lack of coordination: Secondary | ICD-10-CM | POA: Diagnosis not present

## 2015-02-20 DIAGNOSIS — N390 Urinary tract infection, site not specified: Secondary | ICD-10-CM | POA: Diagnosis not present

## 2015-02-20 DIAGNOSIS — R2689 Other abnormalities of gait and mobility: Secondary | ICD-10-CM | POA: Diagnosis not present

## 2015-02-28 ENCOUNTER — Other Ambulatory Visit: Payer: Self-pay | Admitting: Internal Medicine

## 2015-02-28 DIAGNOSIS — M549 Dorsalgia, unspecified: Secondary | ICD-10-CM

## 2015-03-01 MED ORDER — TRAMADOL HCL 50 MG PO TABS: ORAL_TABLET | ORAL | Status: DC

## 2015-03-02 ENCOUNTER — Other Ambulatory Visit: Payer: Self-pay | Admitting: Physician Assistant

## 2015-03-15 ENCOUNTER — Other Ambulatory Visit: Payer: Self-pay | Admitting: Internal Medicine

## 2015-03-15 MED ORDER — FLUCONAZOLE 150 MG PO TABS: ORAL_TABLET | ORAL | Status: DC

## 2015-04-04 DIAGNOSIS — I739 Peripheral vascular disease, unspecified: Secondary | ICD-10-CM | POA: Diagnosis not present

## 2015-04-04 DIAGNOSIS — L603 Nail dystrophy: Secondary | ICD-10-CM | POA: Diagnosis not present

## 2015-04-05 ENCOUNTER — Encounter: Payer: Self-pay | Admitting: Internal Medicine

## 2015-04-05 ENCOUNTER — Ambulatory Visit (INDEPENDENT_AMBULATORY_CARE_PROVIDER_SITE_OTHER): Payer: Medicare Other | Admitting: Internal Medicine

## 2015-04-05 VITALS — BP 122/66 | HR 86 | Temp 98.2°F | Resp 16 | Ht 61.0 in | Wt 176.0 lb

## 2015-04-05 DIAGNOSIS — I8312 Varicose veins of left lower extremity with inflammation: Secondary | ICD-10-CM | POA: Diagnosis not present

## 2015-04-05 DIAGNOSIS — I872 Venous insufficiency (chronic) (peripheral): Secondary | ICD-10-CM

## 2015-04-05 DIAGNOSIS — I8311 Varicose veins of right lower extremity with inflammation: Secondary | ICD-10-CM

## 2015-04-05 NOTE — Progress Notes (Addendum)
   Subjective:    Patient ID: Alyssa Schmitt, female    DOB: Jun 05, 1928, 81 y.o.   MRN: [id number]  HPI  Patient is an 81 y.o. Female who presents to the office for evaluation of bilateral leg venous stasis changes.  She reports that her visit to the podiatrist for her toenail cutting.  She reports that they were concerned that she may be getting cellulitis again.  She has had no pain, warmth, sores, fever, or redness.     Review of Systems  Constitutional: Negative for fever, chills and fatigue.  Skin: Negative for color change, rash and wound.  Neurological: Negative for dizziness and headaches.       Objective:   Physical Exam  Constitutional: She is oriented to person, place, and time. She appears well-developed and well-nourished. No distress.  HENT:  Head: Normocephalic and atraumatic.  Mouth/Throat: Oropharynx is clear and moist. No oropharyngeal exudate.  Eyes: Conjunctivae and EOM are normal. Pupils are equal, round, and reactive to light. No scleral icterus.  Neck: Normal range of motion. Neck supple. No JVD present. No thyromegaly present.  Cardiovascular: Normal rate, regular rhythm and intact distal pulses.  Exam reveals no gallop and no friction rub.   Murmur heard. Pulses:      Dorsalis pedis pulses are 1+ on the right side, and 1+ on the left side.       Posterior tibial pulses are 1+ on the right side, and 1+ on the left side.  Negative holmans sign bilaterally, no palpable cords.  Pulmonary/Chest: Effort normal and breath sounds normal. No respiratory distress. She has no wheezes. She has no rales. She exhibits no tenderness.  Musculoskeletal: Normal range of motion.  Lymphadenopathy:    She has no cervical adenopathy.  Neurological: She is alert and oriented to person, place, and time.  Skin: Skin is warm and dry. She is not diaphoretic.  Bilateral venous stasis changes to bilateral knees to 2/3 of the tibia.  There is some hardening of the skin with no  palpable warmth, redness, or swelling.  No ulcers noted on either bilateral foot or in between toes.  Nursing note and vitals reviewed.   Filed Vitals:   04/05/15 1122  BP: 122/66  Pulse: 86  Temp: 98.2 F (36.8 C)  Resp: 16         Assessment & Plan:    1. Venous stasis dermatitis of both lower extremities -vitals stable, ROS negative -good pulses easily palpable -no evidence on exam of cellulitis.   -continue to monitor feet -call if signs of infection which we discussed

## 2015-04-05 NOTE — Patient Instructions (Signed)

## 2015-04-12 ENCOUNTER — Encounter: Payer: Self-pay | Admitting: Internal Medicine

## 2015-04-12 ENCOUNTER — Ambulatory Visit (INDEPENDENT_AMBULATORY_CARE_PROVIDER_SITE_OTHER): Payer: Medicare Other | Admitting: Internal Medicine

## 2015-04-12 VITALS — BP 162/84 | HR 72 | Temp 98.2°F | Resp 18 | Ht 61.0 in

## 2015-04-12 DIAGNOSIS — R600 Localized edema: Secondary | ICD-10-CM

## 2015-04-12 DIAGNOSIS — I10 Essential (primary) hypertension: Secondary | ICD-10-CM | POA: Diagnosis not present

## 2015-04-12 MED ORDER — HYDROCHLOROTHIAZIDE 25 MG PO TABS: 25.0000 mg | ORAL_TABLET | Freq: Every day | ORAL | Status: DC

## 2015-04-12 NOTE — Progress Notes (Signed)
   Subjective:    Patient ID: Alyssa Schmitt, female    DOB: 12-03-1928, 81 y.o.   MRN: [id number]  HPI  Patient is an 81 y.o.  Female who presents to the office for evaluation of weeping of the skin which happened 3 days ago.  She has been using eucerin cream which has been helping.  She reports that she has not had any pain.  She has been wearing her compression stockings.  She reports that she still has some swelling, but it has been a lot better than previous weeks.    Review of Systems  Constitutional: Negative for fever, chills and fatigue.  Respiratory: Negative for chest tightness and shortness of breath.   Cardiovascular: Negative for chest pain.       Objective:   Physical Exam  Constitutional: She is oriented to person, place, and time. She appears well-developed and well-nourished. No distress.  HENT:  Head: Normocephalic and atraumatic.  Mouth/Throat: Oropharynx is clear and moist. No oropharyngeal exudate.  Eyes: Conjunctivae and EOM are normal. Pupils are equal, round, and reactive to light. No scleral icterus.  Neck: Normal range of motion. Neck supple. No JVD present. No thyromegaly present.  Cardiovascular: Normal rate, regular rhythm, normal heart sounds and intact distal pulses.  Exam reveals no gallop and no friction rub.   No murmur heard. Pulses:      Dorsalis pedis pulses are 1+ on the right side, and 1+ on the left side.       Posterior tibial pulses are 1+ on the right side, and 1+ on the left side.  Chronic venous stasis changes bilaterally.  No ulcerations of the feet bilaterally, no skin breakdown.  Mild 1+ swelling  Pulmonary/Chest: Effort normal and breath sounds normal. No respiratory distress. She has no wheezes. She has no rales. She exhibits no tenderness.  Musculoskeletal: Normal range of motion.  Lymphadenopathy:    She has no cervical adenopathy.  Neurological: She is alert and oriented to person, place, and time.  Skin: Skin is warm and dry.  She is not diaphoretic.  Psychiatric: She has a normal mood and affect. Her behavior is normal. Judgment and thought content normal.  Nursing note and vitals reviewed.         Assessment & Plan:    1. Essential hypertension  - hydrochlorothiazide (HYDRODIURIL) 25 MG tablet; Take 1 tablet (25 mg total) by mouth daily.  Dispense: 30 tablet; Refill: 0  2. Bilateral edema of lower extremity -cont compression stockings -elevate feet - hydrochlorothiazide (HYDRODIURIL) 25 MG tablet; Take 1 tablet (25 mg total) by mouth daily.  Dispense: 30 tablet; Refill: 0

## 2015-04-12 NOTE — Patient Instructions (Signed)
WEEPING OF THE SKIN IS LIKELY DUE TO SOME OF THE EDEMA.  NO SKIN BREAKDOWN AND NO INFECTION NOTED.  BP IS MILDLY ELEVATED SO WE WILL START HCTZ or HYRDODIURIL TO HELP WITH BOTH BLOOD PRESSURE AND LEG SWELLING.  WE WILL SEE YOU BACK IN A MONTH.

## 2015-05-06 ENCOUNTER — Other Ambulatory Visit: Payer: Self-pay | Admitting: Internal Medicine

## 2015-05-14 ENCOUNTER — Ambulatory Visit (INDEPENDENT_AMBULATORY_CARE_PROVIDER_SITE_OTHER): Payer: Medicare Other | Admitting: Internal Medicine

## 2015-05-14 ENCOUNTER — Encounter: Payer: Self-pay | Admitting: Internal Medicine

## 2015-05-14 VITALS — BP 136/78 | HR 82 | Temp 98.4°F | Resp 18 | Ht 61.0 in | Wt 176.0 lb

## 2015-05-14 DIAGNOSIS — E559 Vitamin D deficiency, unspecified: Secondary | ICD-10-CM

## 2015-05-14 DIAGNOSIS — Z79899 Other long term (current) drug therapy: Secondary | ICD-10-CM

## 2015-05-14 DIAGNOSIS — I83019 Varicose veins of right lower extremity with ulcer of unspecified site: Secondary | ICD-10-CM

## 2015-05-14 DIAGNOSIS — I83011 Varicose veins of right lower extremity with ulcer of thigh: Secondary | ICD-10-CM | POA: Diagnosis not present

## 2015-05-14 DIAGNOSIS — I10 Essential (primary) hypertension: Secondary | ICD-10-CM | POA: Diagnosis not present

## 2015-05-14 DIAGNOSIS — L97919 Non-pressure chronic ulcer of unspecified part of right lower leg with unspecified severity: Secondary | ICD-10-CM

## 2015-05-14 DIAGNOSIS — E785 Hyperlipidemia, unspecified: Secondary | ICD-10-CM | POA: Diagnosis not present

## 2015-05-14 DIAGNOSIS — R7303 Prediabetes: Secondary | ICD-10-CM

## 2015-05-14 DIAGNOSIS — R600 Localized edema: Secondary | ICD-10-CM

## 2015-05-14 DIAGNOSIS — R7309 Other abnormal glucose: Secondary | ICD-10-CM | POA: Diagnosis not present

## 2015-05-14 DIAGNOSIS — E038 Other specified hypothyroidism: Secondary | ICD-10-CM

## 2015-05-14 MED ORDER — HYDROCHLOROTHIAZIDE 25 MG PO TABS: 25.0000 mg | ORAL_TABLET | Freq: Every day | ORAL | Status: DC

## 2015-05-14 NOTE — Progress Notes (Signed)
Patient ID: Alyssa Schmitt, female   DOB: 14-Nov-1928, 81 y.o.   MRN: [id number]  Assessment and Plan:  Hypertension:  -Continue medication,  -monitor blood pressure at home.  -Continue DASH diet.   -Reminder to go to the ER if any CP, SOB, nausea, dizziness, severe HA, changes vision/speech, left arm numbness and tingling, and jaw pain.  Cholesterol: -Continue diet and exercise.  -Check cholesterol.   Pre-diabetes: -Continue diet and exercise.  -Check A1C  Vitamin D Def: -check level -continue medications.   Continue diet and meds as discussed. Further disposition pending results of labs.  HPI 81 y.o. female  presents for 3 month follow up with hypertension, hyperlipidemia, prediabetes and vitamin D.   Her blood pressure has been controlled at home, today their BP is BP: 136/78 mmHg.   She does workout. She denies chest pain, shortness of breath, dizziness.   She is on cholesterol medication and denies myalgias. Her cholesterol is at goal. The cholesterol last visit was:   Lab Results  Component Value Date   CHOL 142 02/07/2015   HDL 49 02/07/2015   LDLCALC 68 02/07/2015   TRIG 126 02/07/2015   CHOLHDL 2.9 02/07/2015     She has been working on diet and exercise for prediabetes, and denies foot ulcerations, hyperglycemia, hypoglycemia , increased appetite, nausea, paresthesia of the feet, polydipsia, polyuria, visual disturbances, vomiting and weight loss. Last A1C in the office was:  Lab Results  Component Value Date   HGBA1C 5.6 02/07/2015    Patient is on Vitamin D supplement.  Lab Results  Component Value Date   VD25OH 87 02/07/2015      Patient reports that her leg swelling is doing much better and she has been checking her legs.    She reports that she is working hard on her PT exercises and her daughter is helping her eat a lot better.  She also has been playing with her grandson.    Current Medications:  Current Outpatient Prescriptions on File Prior to  Visit  Medication Sig Dispense Refill  . acetaminophen (TYLENOL) 325 MG tablet Take 2 tablets (650 mg total) by mouth every 6 (six) hours as needed (prn TEMP>38.1 Celsius). 30 tablet 2  . busPIRone (BUSPAR) 10 MG tablet TAKE 1/2 - 1 TABLET 3 TIMES A DAY AS NEEDED FOR ANXIETY 270 tablet 1  . calcium-vitamin D (OSCAL WITH D) 250-125 MG-UNIT per tablet Take 1 tablet by mouth daily.    . Cholecalciferol (VITAMIN D3) 2000 UNITS TABS Take 4 tablets by mouth daily.     . citalopram (CELEXA) 20 MG tablet 1/2-1 pill daily 90 tablet 0  . fexofenadine (ALLEGRA) 180 MG tablet Take 180 mg by mouth daily.     . fluconazole (DIFLUCAN) 150 MG tablet Take 1 tablet weekly for vaginal yeast infection 3 tablet 3  . hydrochlorothiazide (HYDRODIURIL) 25 MG tablet TAKE 1 TABLET BY MOUTH DAILY 30 tablet 3  . levothyroxine (SYNTHROID, LEVOTHROID) 50 MCG tablet TAKE 1 TABLET BY MOUTH DAILY 90 tablet 3  . magnesium oxide (MAG-OX) 400 (241.3 MG) MG tablet Take 1 tablet (400 mg total) by mouth 2 (two) times daily. 60 tablet 2  . Multiple Vitamin (MULITIVITAMIN WITH MINERALS) TABS Take 1 tablet by mouth daily.    [name] Kitchen omeprazole (PRILOSEC) 40 MG capsule TAKE 1 CAPSULE DAILY 90 capsule 4  . Probiotic Product (PROBIOTIC FORMULA PO) Take 1 tablet by mouth daily.     [name] Kitchen terazosin (HYTRIN) 10 MG capsule Take 1  capsule (10 mg total) by mouth daily. 90 capsule 3  . traMADol (ULTRAM) 50 MG tablet TAKE 1/2-1 TABLET 4 TIMES A DAY AS NEEDED FOR PAIN 100 tablet 0   No current facility-administered medications on file prior to visit.    Medical History:  Past Medical History  Diagnosis Date  . Abnormal EKG     LBBB  . H/O echocardiogram   . Ulcer   . Allergy   . Arthritis   . Hypertension   . Thyroid disease   . Hyperlipidemia   . GERD (gastroesophageal reflux disease)   . Anxiety   . Depression   . COPD (chronic obstructive pulmonary disease)   . Osteoporosis   . Prediabetes   . Venous insufficiency   . Vitamin D  deficiency     Allergies:  Allergies  Allergen Reactions  . Aspirin Swelling  . Celebrex [Celecoxib] Other (See Comments)    "my body lit up"  . Fosamax [Alendronate Sodium] Other (See Comments)    unknown  . Keflex [Cephalexin] Other (See Comments)    unknown  . Penicillins Other (See Comments)    unknown  . Prednisone Itching  . Prevacid [Lansoprazole] Other (See Comments)    unknown  . Sulfa Antibiotics Other (See Comments)    unknown  . Tetanus Toxoids Other (See Comments)    unknown  . Tetracyclines & Related Nausea Only    Nausea      Review of Systems:  Review of Systems  Constitutional: Negative for fever, chills and malaise/fatigue.  HENT: Negative for congestion, ear pain and sore throat.   Eyes: Negative.   Respiratory: Negative for cough, shortness of breath and wheezing.   Cardiovascular: Positive for leg swelling (chronic issues). Negative for chest pain and palpitations.  Gastrointestinal: Negative for heartburn, nausea, vomiting, diarrhea, constipation, blood in stool and melena.  Genitourinary: Negative for dysuria, urgency and frequency.  Skin: Negative.   Neurological: Negative for dizziness, sensory change, loss of consciousness and headaches.  Psychiatric/Behavioral: Negative for depression. The patient is not nervous/anxious and does not have insomnia.     Family history- Review and unchanged  Social history- Review and unchanged  Physical Exam: BP 136/78 mmHg  Pulse 82  Temp(Src) 98.4 F (36.9 C) (Temporal)  Resp 18  Ht 5\' 1"  (1.549 m)  Wt 176 lb (79.833 kg)  BMI 33.27 kg/m2 Wt Readings from Last 3 Encounters:  05/14/15 176 lb (79.833 kg)  04/05/15 176 lb (79.833 kg)  02/07/15 171 lb 12.8 oz (77.928 kg)    General Appearance: Well nourished well developed, in no apparent distress. Eyes: PERRLA, EOMs, conjunctiva no swelling or erythema ENT/Mouth: Ear canals normal without obstruction, swelling, erythma, discharge.  TMs normal  bilaterally.  Oropharynx moist, clear, without exudate, or postoropharyngeal swelling. Neck: Supple, thyroid normal,no cervical adenopathy  Respiratory: Respiratory effort normal, Breath sounds clear A&P without rhonchi, wheeze, or rale.  No retractions, no accessory usage. Cardio: RRR with no  2/6 systolic murmur, RGs. Brisk peripheral pulses without edema.  Abdomen: Soft, + BS,  Non tender, no guarding, rebound, hernias, masses. Musculoskeletal: Full ROM, 5/5 strength, Normal gait Skin: Warm, dry without rashes, lesions, ecchymosis.  Neuro: Awake and oriented X 3, Cranial nerves intact. Normal muscle tone, no cerebellar symptoms. Psych: Normal affect, Insight and Judgment appropriate.    FORCUCCI, Heitor Steinhoff, PA-C 2:57 PM [facility]

## 2015-05-14 NOTE — Patient Instructions (Signed)

## 2015-05-15 LAB — CBC WITH DIFFERENTIAL/PLATELET
Basophils Absolute: 0 10*3/uL (ref 0.0–0.1)
Basophils Relative: 1 % (ref 0–1)
Eosinophils Absolute: 0 10*3/uL (ref 0.0–0.7)
Eosinophils Relative: 1 % (ref 0–5)
HCT: 37.9 % (ref 36.0–46.0)
Hemoglobin: 12.5 g/dL (ref 12.0–15.0)
Lymphocytes Relative: 43 % (ref 12–46)
Lymphs Abs: 1.8 10*3/uL (ref 0.7–4.0)
MCH: 29.3 pg (ref 26.0–34.0)
MCHC: 33 g/dL (ref 30.0–36.0)
MCV: 88.8 fL (ref 78.0–100.0)
MPV: 10.3 fL (ref 8.6–12.4)
Monocytes Absolute: 0.3 10*3/uL (ref 0.1–1.0)
Monocytes Relative: 8 % (ref 3–12)
Neutro Abs: 1.9 10*3/uL (ref 1.7–7.7)
Neutrophils Relative %: 47 % (ref 43–77)
Platelets: 170 10*3/uL (ref 150–400)
RBC: 4.27 MIL/uL (ref 3.87–5.11)
RDW: 14 % (ref 11.5–15.5)
WBC: 4.1 10*3/uL (ref 4.0–10.5)

## 2015-05-15 LAB — BASIC METABOLIC PANEL WITH GFR
BUN: 24 mg/dL — ABNORMAL HIGH (ref 6–23)
CO2: 26 mEq/L (ref 19–32)
Calcium: 9.4 mg/dL (ref 8.4–10.5)
Chloride: 100 mEq/L (ref 96–112)
Creat: 0.94 mg/dL (ref 0.50–1.10)
GFR, Est African American: 64 mL/min
GFR, Est Non African American: 56 mL/min — ABNORMAL LOW
Glucose, Bld: 87 mg/dL (ref 70–99)
Potassium: 4.3 mEq/L (ref 3.5–5.3)
Sodium: 137 mEq/L (ref 135–145)

## 2015-05-15 LAB — HEPATIC FUNCTION PANEL
ALT: 21 U/L (ref 0–35)
AST: 34 U/L (ref 0–37)
Albumin: 4.3 g/dL (ref 3.5–5.2)
Alkaline Phosphatase: 63 U/L (ref 39–117)
Bilirubin, Direct: 0.1 mg/dL (ref 0.0–0.3)
Indirect Bilirubin: 0.4 mg/dL (ref 0.2–1.2)
Total Bilirubin: 0.5 mg/dL (ref 0.2–1.2)
Total Protein: 7.7 g/dL (ref 6.0–8.3)

## 2015-05-15 LAB — MAGNESIUM: Magnesium: 1.9 mg/dL (ref 1.5–2.5)

## 2015-05-15 LAB — LIPID PANEL
Cholesterol: 165 mg/dL (ref 0–200)
HDL: 61 mg/dL (ref >=46)
LDL Cholesterol: 90 mg/dL (ref 0–99)
Total CHOL/HDL Ratio: 2.7 Ratio
Triglycerides: 72 mg/dL (ref <150)
VLDL: 14 mg/dL (ref 0–40)

## 2015-05-15 LAB — HEMOGLOBIN A1C
Hgb A1c MFr Bld: 5.6 % (ref <5.7)
Mean Plasma Glucose: 114 mg/dL (ref <117)

## 2015-05-15 LAB — INSULIN, RANDOM: Insulin: 7.3 u[IU]/mL (ref 2.0–19.6)

## 2015-05-15 LAB — TSH: TSH: 3.536 u[IU]/mL (ref 0.350–4.500)

## 2015-05-17 LAB — VITAMIN D 1,25 DIHYDROXY
Vitamin D 1, 25 (OH)2 Total: 37 pg/mL (ref 18–72)
Vitamin D2 1, 25 (OH)2: <8 pg/mL
Vitamin D3 1, 25 (OH)2: 37 pg/mL

## 2015-05-20 DIAGNOSIS — H01003 Unspecified blepharitis right eye, unspecified eyelid: Secondary | ICD-10-CM | POA: Diagnosis not present

## 2015-05-20 DIAGNOSIS — H2513 Age-related nuclear cataract, bilateral: Secondary | ICD-10-CM | POA: Diagnosis not present

## 2015-05-20 DIAGNOSIS — H40033 Anatomical narrow angle, bilateral: Secondary | ICD-10-CM | POA: Diagnosis not present

## 2015-05-20 DIAGNOSIS — H40013 Open angle with borderline findings, low risk, bilateral: Secondary | ICD-10-CM | POA: Diagnosis not present

## 2015-05-27 ENCOUNTER — Other Ambulatory Visit: Payer: Self-pay | Admitting: Physician Assistant

## 2015-06-20 DIAGNOSIS — I739 Peripheral vascular disease, unspecified: Secondary | ICD-10-CM | POA: Diagnosis not present

## 2015-06-20 DIAGNOSIS — L603 Nail dystrophy: Secondary | ICD-10-CM | POA: Diagnosis not present

## 2015-07-10 DIAGNOSIS — Z1231 Encounter for screening mammogram for malignant neoplasm of breast: Secondary | ICD-10-CM | POA: Diagnosis not present

## 2015-07-10 DIAGNOSIS — M8580 Other specified disorders of bone density and structure, unspecified site: Secondary | ICD-10-CM | POA: Diagnosis not present

## 2015-07-16 ENCOUNTER — Other Ambulatory Visit: Payer: Self-pay | Admitting: Internal Medicine

## 2015-07-16 DIAGNOSIS — M810 Age-related osteoporosis without current pathological fracture: Secondary | ICD-10-CM | POA: Insufficient documentation

## 2015-08-06 ENCOUNTER — Other Ambulatory Visit: Payer: Self-pay | Admitting: Internal Medicine

## 2015-08-09 ENCOUNTER — Encounter: Payer: Self-pay | Admitting: Internal Medicine

## 2015-08-15 ENCOUNTER — Ambulatory Visit (INDEPENDENT_AMBULATORY_CARE_PROVIDER_SITE_OTHER): Payer: Medicare Other | Admitting: Internal Medicine

## 2015-08-15 ENCOUNTER — Encounter: Payer: Self-pay | Admitting: Internal Medicine

## 2015-08-15 VITALS — BP 156/84 | HR 88 | Temp 97.7°F | Resp 16 | Ht 61.0 in | Wt 178.2 lb

## 2015-08-15 DIAGNOSIS — I10 Essential (primary) hypertension: Secondary | ICD-10-CM | POA: Diagnosis not present

## 2015-08-15 DIAGNOSIS — Z789 Other specified health status: Secondary | ICD-10-CM

## 2015-08-15 DIAGNOSIS — E785 Hyperlipidemia, unspecified: Secondary | ICD-10-CM

## 2015-08-15 DIAGNOSIS — K219 Gastro-esophageal reflux disease without esophagitis: Secondary | ICD-10-CM | POA: Diagnosis not present

## 2015-08-15 DIAGNOSIS — M810 Age-related osteoporosis without current pathological fracture: Secondary | ICD-10-CM | POA: Diagnosis not present

## 2015-08-15 DIAGNOSIS — J42 Unspecified chronic bronchitis: Secondary | ICD-10-CM | POA: Diagnosis not present

## 2015-08-15 DIAGNOSIS — Z79899 Other long term (current) drug therapy: Secondary | ICD-10-CM

## 2015-08-15 DIAGNOSIS — R7309 Other abnormal glucose: Secondary | ICD-10-CM | POA: Diagnosis not present

## 2015-08-15 DIAGNOSIS — R7303 Prediabetes: Secondary | ICD-10-CM

## 2015-08-15 DIAGNOSIS — Z9181 History of falling: Secondary | ICD-10-CM

## 2015-08-15 DIAGNOSIS — Z1389 Encounter for screening for other disorder: Secondary | ICD-10-CM | POA: Diagnosis not present

## 2015-08-15 DIAGNOSIS — J449 Chronic obstructive pulmonary disease, unspecified: Secondary | ICD-10-CM | POA: Insufficient documentation

## 2015-08-15 DIAGNOSIS — Z1331 Encounter for screening for depression: Secondary | ICD-10-CM

## 2015-08-15 DIAGNOSIS — Z6833 Body mass index (BMI) 33.0-33.9, adult: Secondary | ICD-10-CM

## 2015-08-15 DIAGNOSIS — Z1212 Encounter for screening for malignant neoplasm of rectum: Secondary | ICD-10-CM

## 2015-08-15 DIAGNOSIS — E559 Vitamin D deficiency, unspecified: Secondary | ICD-10-CM | POA: Diagnosis not present

## 2015-08-15 DIAGNOSIS — E038 Other specified hypothyroidism: Secondary | ICD-10-CM | POA: Diagnosis not present

## 2015-08-15 LAB — CBC WITH DIFFERENTIAL/PLATELET
Basophils Absolute: 0 10*3/uL (ref 0.0–0.1)
Basophils Relative: 1 % (ref 0–1)
Eosinophils Absolute: 0 10*3/uL (ref 0.0–0.7)
Eosinophils Relative: 1 % (ref 0–5)
HCT: 38.9 % (ref 36.0–46.0)
Hemoglobin: 13.1 g/dL (ref 12.0–15.0)
Lymphocytes Relative: 33 % (ref 12–46)
Lymphs Abs: 1.6 10*3/uL (ref 0.7–4.0)
MCH: 30.3 pg (ref 26.0–34.0)
MCHC: 33.7 g/dL (ref 30.0–36.0)
MCV: 90 fL (ref 78.0–100.0)
MPV: 10.4 fL (ref 8.6–12.4)
Monocytes Absolute: 0.4 10*3/uL (ref 0.1–1.0)
Monocytes Relative: 8 % (ref 3–12)
Neutro Abs: 2.7 10*3/uL (ref 1.7–7.7)
Neutrophils Relative %: 57 % (ref 43–77)
Platelets: 169 10*3/uL (ref 150–400)
RBC: 4.32 MIL/uL (ref 3.87–5.11)
RDW: 13.7 % (ref 11.5–15.5)
WBC: 4.7 10*3/uL (ref 4.0–10.5)

## 2015-08-15 LAB — HEPATIC FUNCTION PANEL
ALT: 22 U/L (ref 6–29)
AST: 34 U/L (ref 10–35)
Albumin: 4.4 g/dL (ref 3.6–5.1)
Alkaline Phosphatase: 59 U/L (ref 33–130)
Bilirubin, Direct: 0.2 mg/dL (ref <=0.2)
Indirect Bilirubin: 0.5 mg/dL (ref 0.2–1.2)
Total Bilirubin: 0.7 mg/dL (ref 0.2–1.2)
Total Protein: 8 g/dL (ref 6.1–8.1)

## 2015-08-15 LAB — BASIC METABOLIC PANEL WITH GFR
BUN: 22 mg/dL (ref 7–25)
CO2: 28 mmol/L (ref 20–31)
Calcium: 9.6 mg/dL (ref 8.6–10.4)
Chloride: 99 mmol/L (ref 98–110)
Creat: 0.96 mg/dL — ABNORMAL HIGH (ref 0.60–0.88)
GFR, Est African American: 63 mL/min (ref >=60)
GFR, Est Non African American: 54 mL/min — ABNORMAL LOW (ref >=60)
Glucose, Bld: 86 mg/dL (ref 65–99)
Potassium: 3.9 mmol/L (ref 3.5–5.3)
Sodium: 139 mmol/L (ref 135–146)

## 2015-08-15 LAB — LIPID PANEL
Cholesterol: 157 mg/dL (ref 125–200)
HDL: 56 mg/dL (ref >=46)
LDL Cholesterol: 82 mg/dL (ref <130)
Total CHOL/HDL Ratio: 2.8 Ratio (ref <=5.0)
Triglycerides: 93 mg/dL (ref <150)
VLDL: 19 mg/dL (ref <30)

## 2015-08-15 LAB — MAGNESIUM: Magnesium: 1.9 mg/dL (ref 1.5–2.5)

## 2015-08-15 LAB — TSH: TSH: 2.652 u[IU]/mL (ref 0.350–4.500)

## 2015-08-15 NOTE — Patient Instructions (Signed)
Recommend Adult Low dose Aspirin or coated  Aspirin 81 mg daily   To reduce risk of Colon Cancer 20 %,   Skin Cancer 26 % ,   Melanoma 46%   and   Pancreatic cancer 60% ++++++++++++++++++ Vitamin D goal is between 70-100.   Please make sure that you are taking your Vitamin D as directed.   It is very important as a natural anti-inflammatory   helping hair, skin, and nails, as well as reducing stroke and heart attack risk.   It helps your bones and helps with mood.  It also decreases numerous cancer risks so please take it as directed.   Low Vit D is associated with a 200-300% higher risk for CANCER   and 200-300% higher risk for HEART   ATTACK  &  STROKE.   ......................................  It is also associated with higher death rate at younger ages,   autoimmune diseases like Rheumatoid arthritis, Lupus, Multiple Sclerosis.     Also many other serious conditions, like depression, Alzheimer's  Dementia, infertility, muscle aches, fatigue, fibromyalgia - just to name a few.  +++++++++++++++++++  Recommend the book "The END of DIETING" by Dr Joel Fuhrman   & the book "The END of DIABETES " by Dr Joel Fuhrman  At [employer] - get book & Audio CD's     Being diabetic has a  300% increased risk for heart attack, stroke, cancer, and alzheimer- type vascular dementia. It is very important that you work harder with diet by avoiding all foods that are white. Avoid white rice (brown & wild rice is OK), white potatoes (sweetpotatoes in moderation is OK), White bread or wheat bread or anything made out of white flour like bagels, donuts, rolls, buns, biscuits, cakes, pastries, cookies, pizza crust, and pasta (made from white flour & egg whites) - vegetarian pasta or spinach or wheat pasta is OK. Multigrain breads like Arnold's or [employer], or multigrain sandwich thins or flatbreads.  Diet, exercise and weight loss can reverse and cure diabetes in the early stages.   Diet, exercise and weight loss is very important in the control and prevention of complications of diabetes which affects every system in your body, ie. Brain - dementia/stroke, eyes - glaucoma/blindness, heart - heart attack/heart failure, kidneys - dialysis, stomach - gastric paralysis, intestines - malabsorption, nerves - severe painful neuritis, circulation - gangrene & loss of a leg(s), and finally cancer and Alzheimers.    I recommend avoid fried & greasy foods,  sweets/candy, white rice (brown or wild rice or Quinoa is OK), white potatoes (sweet potatoes are OK) - anything made from white flour - bagels, doughnuts, rolls, buns, biscuits,white and wheat breads, pizza crust and traditional pasta made of white flour & egg white(vegetarian pasta or spinach or wheat pasta is OK).  Multi-grain bread is OK - like multi-grain flat bread or sandwich thins. Avoid alcohol in excess. Exercise is also important.    Eat all the vegetables you want - avoid meat, especially red meat and dairy - especially cheese.  Cheese is the most concentrated form of trans-fats which is the worst thing to clog up our arteries. Veggie cheese is OK which can be found in the fresh produce section at [employer] or [employer] or [employer]  ++++++++++++++++++++++++++   Preventive Care for Adults  A healthy lifestyle and preventive care can promote health and wellness. Preventive health guidelines for women include the following key practices.  A routine yearly physical is a good way   to check with your health care provider about your health and preventive screening. It is a chance to share any concerns and updates on your health and to receive a thorough exam.  Visit your dentist for a routine exam and preventive care every 6 months. Brush your teeth twice a day and floss once a day. Good oral hygiene prevents tooth decay and gum disease.  The frequency of eye exams is based on your age, health, family medical history, use  of contact lenses, and other factors. Follow your health care provider's recommendations for frequency of eye exams.  Eat a healthy diet. Foods like vegetables, fruits, whole grains, low-fat dairy products, and lean protein foods contain the nutrients you need without too many calories. Decrease your intake of foods high in solid fats, added sugars, and salt. Eat the right amount of calories for you.Get information about a proper diet from your health care provider, if necessary.  Regular physical exercise is one of the most important things you can do for your health. Most adults should get at least 150 minutes of moderate-intensity exercise (any activity that increases your heart rate and causes you to sweat) each week. In addition, most adults need muscle-strengthening exercises on 2 or more days a week.  Maintain a healthy weight. The body mass index (BMI) is a screening tool to identify possible weight problems. It provides an estimate of body fat based on height and weight. Your health care provider can find your BMI and can help you achieve or maintain a healthy weight.For adults 20 years and older:  A BMI below 18.5 is considered underweight.  A BMI of 18.5 to 24.9 is normal.  A BMI of 25 to 29.9 is considered overweight.  A BMI of 30 and above is considered obese.  Maintain normal blood lipids and cholesterol levels by exercising and minimizing your intake of saturated fat. Eat a balanced diet with plenty of fruit and vegetables. If your lipid or cholesterol levels are high, you are over 50, or you are at high risk for heart disease, you may need your cholesterol levels checked more frequently.Ongoing high lipid and cholesterol levels should be treated with medicines if diet and exercise are not working.  If you smoke, find out from your health care provider how to quit. If you do not use tobacco, do not start.  Lung cancer screening is recommended for adults aged 55-80 years who are  at high risk for developing lung cancer because of a history of smoking. A yearly low-dose CT scan of the lungs is recommended for people who have at least a 30-pack-year history of smoking and are a current smoker or have quit within the past 15 years. A pack year of smoking is smoking an average of 1 pack of cigarettes a day for 1 year (for example: 1 pack a day for 30 years or 2 packs a day for 15 years). Yearly screening should continue until the smoker has stopped smoking for at least 15 years. Yearly screening should be stopped for people who develop a health problem that would prevent them from having lung cancer treatment.  Avoid use of street drugs. Do not share needles with anyone. Ask for help if you need support or instructions about stopping the use of drugs.  High blood pressure causes heart disease and increases the risk of stroke.  Ongoing high blood pressure should be treated with medicines if weight loss and exercise do not work.  If you are 55-79   years old, ask your health care provider if you should take aspirin to prevent strokes.  Diabetes screening involves taking a blood sample to check your fasting blood sugar level. This should be done once every 3 years, after age 45, if you are within normal weight and without risk factors for diabetes. Testing should be considered at a younger age or be carried out more frequently if you are overweight and have at least 1 risk factor for diabetes.  Breast cancer screening is essential preventive care for women. You should practice "breast self-awareness." This means understanding the normal appearance and feel of your breasts and may include breast self-examination. Any changes detected, no matter how small, should be reported to a health care provider. Women in their 20s and 30s should have a clinical breast exam (CBE) by a health care provider as part of a regular health exam every 1 to 3 years. After age 40, women should have a CBE every  year. Starting at age 40, women should consider having a mammogram (breast X-ray test) every year. Women who have a family history of breast cancer should talk to their health care provider about genetic screening. Women at a high risk of breast cancer should talk to their health care providers about having an MRI and a mammogram every year.  Breast cancer gene (BRCA)-related cancer risk assessment is recommended for women who have family members with BRCA-related cancers. BRCA-related cancers include breast, ovarian, tubal, and peritoneal cancers. Having family members with these cancers may be associated with an increased risk for harmful changes (mutations) in the breast cancer genes BRCA1 and BRCA2. Results of the assessment will determine the need for genetic counseling and BRCA1 and BRCA2 testing.  Routine pelvic exams to screen for cancer are no longer recommended for nonpregnant women who are considered low risk for cancer of the pelvic organs (ovaries, uterus, and vagina) and who do not have symptoms. Ask your health care provider if a screening pelvic exam is right for you.  If you have had past treatment for cervical cancer or a condition that could lead to cancer, you need Pap tests and screening for cancer for at least 20 years after your treatment. If Pap tests have been discontinued, your risk factors (such as having a new sexual partner) need to be reassessed to determine if screening should be resumed. Some women have medical problems that increase the chance of getting cervical cancer. In these cases, your health care provider may recommend more frequent screening and Pap tests.    Colorectal cancer can be detected and often prevented. Most routine colorectal cancer screening begins at the age of 50 years and continues through age 75 years. However, your health care provider may recommend screening at an earlier age if you have risk factors for colon cancer. On a yearly basis, your health  care provider may provide home test kits to check for hidden blood in the stool. Use of a small camera at the end of a tube, to directly examine the colon (sigmoidoscopy or colonoscopy), can detect the earliest forms of colorectal cancer. Talk to your health care provider about this at age 50, when routine screening begins. Direct exam of the colon should be repeated every 5-10 years through age 75 years, unless early forms of pre-cancerous polyps or small growths are found.  Osteoporosis is a disease in which the bones lose minerals and strength with aging. This can result in serious bone fractures or breaks. The risk of osteoporosis   can be identified using a bone density scan. Women ages 65 years and over and women at risk for fractures or osteoporosis should discuss screening with their health care providers. Ask your health care provider whether you should take a calcium supplement or vitamin D to reduce the rate of osteoporosis.  Menopause can be associated with physical symptoms and risks. Hormone replacement therapy is available to decrease symptoms and risks. You should talk to your health care provider about whether hormone replacement therapy is right for you.  Use sunscreen. Apply sunscreen liberally and repeatedly throughout the day. You should seek shade when your shadow is shorter than you. Protect yourself by wearing long sleeves, pants, a wide-brimmed hat, and sunglasses year round, whenever you are outdoors.  Once a month, do a whole body skin exam, using a mirror to look at the skin on your back. Tell your health care provider of new moles, moles that have irregular borders, moles that are larger than a pencil eraser, or moles that have changed in shape or color.  Stay current with required vaccines (immunizations).  Influenza vaccine. All adults should be immunized every year.  Tetanus, diphtheria, and acellular pertussis (Td, Tdap) vaccine. Pregnant women should receive 1 dose of  Tdap vaccine during each pregnancy. The dose should be obtained regardless of the length of time since the last dose. Immunization is preferred during the 27th-36th week of gestation. An adult who has not previously received Tdap or who does not know her vaccine status should receive 1 dose of Tdap. This initial dose should be followed by tetanus and diphtheria toxoids (Td) booster doses every 10 years. Adults with an unknown or incomplete history of completing a 3-dose immunization series with Td-containing vaccines should begin or complete a primary immunization series including a Tdap dose. Adults should receive a Td booster every 10 years.    Zoster vaccine. One dose is recommended for adults aged 60 years or older unless certain conditions are present.    Pneumococcal 13-valent conjugate (PCV13) vaccine. When indicated, a person who is uncertain of her immunization history and has no record of immunization should receive the PCV13 vaccine. An adult aged 19 years or older who has certain medical conditions and has not been previously immunized should receive 1 dose of PCV13 vaccine. This PCV13 should be followed with a dose of pneumococcal polysaccharide (PPSV23) vaccine. The PPSV23 vaccine dose should be obtained at least 8 weeks after the dose of PCV13 vaccine. An adult aged 19 years or older who has certain medical conditions and previously received 1 or more doses of PPSV23 vaccine should receive 1 dose of PCV13. The PCV13 vaccine dose should be obtained 1 or more years after the last PPSV23 vaccine dose.    Pneumococcal polysaccharide (PPSV23) vaccine. When PCV13 is also indicated, PCV13 should be obtained first. All adults aged 65 years and older should be immunized. An adult younger than age 65 years who has certain medical conditions should be immunized. Any person who resides in a nursing home or long-term care facility should be immunized. An adult smoker should be immunized. People with an  immunocompromised condition and certain other conditions should receive both PCV13 and PPSV23 vaccines. People with human immunodeficiency virus (HIV) infection should be immunized as soon as possible after diagnosis. Immunization during chemotherapy or radiation therapy should be avoided. Routine use of PPSV23 vaccine is not recommended for American Indians, [place] Natives, or people younger than 65 years unless there are medical conditions that require   PPSV23 vaccine. When indicated, people who have unknown immunization and have no record of immunization should receive PPSV23 vaccine. One-time revaccination 5 years after the first dose of PPSV23 is recommended for people aged 19-64 years who have chronic kidney failure, nephrotic syndrome, asplenia, or immunocompromised conditions. People who received 1-2 doses of PPSV23 before age 65 years should receive another dose of PPSV23 vaccine at age 65 years or later if at least 5 years have passed since the previous dose. Doses of PPSV23 are not needed for people immunized with PPSV23 at or after age 65 years.   Preventive Services / Frequency  Ages 65 years and over  Blood pressure check.  Lipid and cholesterol check.  Lung cancer screening. / Every year if you are aged 55-80 years and have a 30-pack-year history of smoking and currently smoke or have quit within the past 15 years. Yearly screening is stopped once you have quit smoking for at least 15 years or develop a health problem that would prevent you from having lung cancer treatment.  Clinical breast exam.** / Every year after age 40 years.  BRCA-related cancer risk assessment.** / For women who have family members with a BRCA-related cancer (breast, ovarian, tubal, or peritoneal cancers).  Mammogram.** / Every year beginning at age 40 years and continuing for as long as you are in good health. Consult with your health care provider.  Pap test.** / Every 3 years starting at age 30 years  through age 65 or 70 years with 3 consecutive normal Pap tests. Testing can be stopped between 65 and 70 years with 3 consecutive normal Pap tests and no abnormal Pap or HPV tests in the past 10 years.  Fecal occult blood test (FOBT) of stool. / Every year beginning at age 50 years and continuing until age 75 years. You may not need to do this test if you get a colonoscopy every 10 years.  Flexible sigmoidoscopy or colonoscopy.** / Every 5 years for a flexible sigmoidoscopy or every 10 years for a colonoscopy beginning at age 50 years and continuing until age 75 years.  Hepatitis C blood test.** / For all people born from 1945 through 1965 and any individual with known risks for hepatitis C.  Osteoporosis screening.** / A one-time screening for women ages 65 years and over and women at risk for fractures or osteoporosis.  Skin self-exam. / Monthly.  Influenza vaccine. / Every year.  Tetanus, diphtheria, and acellular pertussis (Tdap/Td) vaccine.** / 1 dose of Td every 10 years.  Zoster vaccine.** / 1 dose for adults aged 60 years or older.  Pneumococcal 13-valent conjugate (PCV13) vaccine.** / Consult your health care provider.  Pneumococcal polysaccharide (PPSV23) vaccine.** / 1 dose for all adults aged 65 years and older. Screening for abdominal aortic aneurysm (AAA)  by ultrasound is recommended for people who have history of high blood pressure or who are current or former smokers. 

## 2015-08-15 NOTE — Progress Notes (Signed)
Patient ID: Alyssa Schmitt, female   DOB: December 10, 1927, 81 y.o.   MRN: [id number]   Comprehensive Examination  This very nice 81 y.o. WWF presents for complete physical.  Patient has been followed for HTN, Prediabetes, Hyperlipidemia, and Vitamin D Deficiency. Patient also has GERD controlled with diet and Omeprazole.     HTN predates since 56. Patient's BP has been controlled at home and patient denies any cardiac symptoms as chest pain, palpitations, shortness of breath, dizziness or ankle swelling. Today's BP was 156/84 and rechecked at 142/82.     Patient's hyperlipidemia is controlled with diet and medications. Patient denies myalgias or other medication SE's. Last lipids were at goal - Cholesterol 165; HDL 61; LDL 90; Triglycerides 72 on 05/14/2015.   Patient has Morbid Obesity (BMI 33+) and consequent  prediabetes predating since Feb 2014 with A1c 5.7% and patient denies reactive hypoglycemic symptoms, visual blurring, diabetic polys, or paresthesias. Last A1c was 5.6% on 05/14/2015.   Finally, patient has history of Vitamin D Deficiency and last Vitamin D was 87 on 02/07/2015.      Medication Sig  . acetaminophen  325 MG tablet Take 2 tablets (650 mg total) by mouth every 6 (six) hours as needed (prn TEMP>38.1 Celsius).  . busPIRon  (BUSPAR) 10 MG tablet TAKE 1/2 - 1 TABLET 3 TIMES A DAY AS NEEDED FOR ANXIETY  . OSCAL WITH D 250-125 mg-u Take 1 tablet by mouth daily.  [name] Kitchen VITAMIN D 2000 UNITS  Take 4 tablets by mouth daily.   . citalopram  20 MG tablet TAKE 1/2-1 TABLET BY MOUTH DAILY  . fexofenadine  180 MG tablet Take 180 mg by mouth daily.   . fluconazole  150 MG tablet Take 1 tablet weekly for vaginal yeast infection - prn  . hctz25 MG tablet Take 1 tablet (25 mg total) by mouth daily.  [name] Kitchen levothyroxine  50 MCG tablet TAKE 1 TABLET BY MOUTH DAILY  . magnesium oxide  400 (241.3 MG)  Take 1 tablet (400 mg total) by mouth 2 (two) times daily.  [name] WITH MINERALS Take 1 tablet by  mouth daily.  [name] Kitchen omeprazole  40 MG capsule TAKE 1 CAPSULE DAILY  . Probiotic Take 1 tablet by mouth daily.   [name] Kitchen terazosin  10 MG capsule Take 1 capsule (10 mg total) by mouth daily.  . traMADol  50 MG tablet TAKE 1/2-1 TABLET 4 TIMES A DAY AS NEEDED FOR PAIN   Allergies  Allergen Reactions  . Aspirin Swelling  . Celebrex [Celecoxib] Other (See Comments)    "my body lit up"  . Fosamax [Alendronate Sodium] Other (See Comments)    unknown  . Keflex [Cephalexin] Other (See Comments)    unknown  . Penicillins Other (See Comments)    unknown  . Prednisone Itching  . Prevacid [Lansoprazole] Other (See Comments)    unknown  . Sulfa Antibiotics Other (See Comments)    unknown  . Tetanus Toxoids Other (See Comments)    unknown  . Tetracyclines & Related Nausea Only    Nausea    Past Medical History  Diagnosis Date  . Hypertension   . Hyperlipidemia   . GERD (gastroesophageal reflux disease)   . COPD (chronic obstructive pulmonary disease)   . Osteoporosis   . Prediabetes   . Vitamin D deficiency    Health Maintenance  Topic Date Due  . TETANUS/TDAP  10/08/1949  . PNA vac Low Risk Adult (2 of 2 - PCV13) 12/17/2008  .  COLONOSCOPY  10/17/2010  . INFLUENZA VACCINE  07/08/2015  . DEXA SCAN  Completed  . ZOSTAVAX  Completed   Immunization History  Administered Date(s) Administered  . Influenza, High Dose Seasonal PF 09/05/2014  . Pneumococcal Polysaccharide-23 12/18/2007  . Zoster 07/25/2013   Past Surgical History  Procedure Laterality Date  . Tonsillectomy      childhood  . Vesicovaginal fistula closure w/ tah     Family History  Problem Relation Age of Onset  . Heart disease Mother   . Other Mother     VARICOSE VEINS  . Diabetes Father   . Other Brother     VARICOSE VEINS   Social History  Substance Use Topics  . Smoking status: Never Smoker   . Smokeless tobacco: Never Used  . Alcohol Use: No    ROS Constitutional: Denies fever, chills, weight loss/gain,  headaches, insomnia,  night sweats, and change in appetite. Does c/o fatigue. Eyes: Denies redness, blurred vision, diplopia, discharge, itchy, watery eyes.  ENT: Denies discharge, congestion, post nasal drip, epistaxis, sore throat, earache, hearing loss, dental pain, Tinnitus, Vertigo, Sinus pain, snoring.  Cardio: Denies chest pain, palpitations, irregular heartbeat, syncope, dyspnea, diaphoresis, orthopnea, PND, claudication, edema Respiratory: denies cough, dyspnea, DOE, pleurisy, hoarseness, laryngitis, wheezing.  Gastrointestinal: Denies dysphagia, heartburn, reflux, water brash, pain, cramps, nausea, vomiting, bloating, diarrhea, constipation, hematemesis, melena, hematochezia, jaundice, hemorrhoids Genitourinary: Denies dysuria, frequency, urgency, nocturia, hesitancy, discharge, hematuria, flank pain Breast: Breast lumps, nipple discharge, bleeding.  Musculoskeletal: Denies arthralgia, myalgia, stiffness, Jt. Swelling, pain, limp, and strain/sprain. Denies falls. Skin: Denies puritis, rash, hives, warts, acne, eczema, changing in skin lesion Neuro: No weakness, tremor, incoordination, spasms, paresthesia, pain Psychiatric: Denies confusion, memory loss, sensory loss. Denies Depression. Endocrine: Denies change in weight, skin, hair change, nocturia, and paresthesia, diabetic polys, visual blurring, hyper / hypo glycemic episodes.  Heme/Lymph: No excessive bleeding, bruising, enlarged lymph nodes.  Physical Exam  BP 156/84  Pulse 88  Temp 97.7 F  Resp 16  Ht 5\' 1"   Wt 178 lb 3.2 oz     BMI 33.69  General Appearance: Over  nourished with central/trunkal obesity and in no apparent distress. Eyes: PERRLA, EOMs, conjunctiva no swelling or erythema, normal fundi and vessels. Sinuses: No frontal/maxillary tenderness ENT/Mouth: EACs patent / TMs  nl. Nares clear without erythema, swelling, mucoid exudates. Oral hygiene is good. No erythema, swelling, or exudate. Tongue normal,  non-obstructing. Tonsils not swollen or erythematous. Hearing normal.  Neck: Supple, thyroid normal. No bruits, nodes or JVD. Respiratory: Respiratory effort normal.  BS equal and clear bilateral without rales, rhonci, wheezing or stridor. Cardio: Heart sounds are normal with regular rate and rhythm and no murmurs, rubs or gallops. Peripheral pulses are faint 1(+) obscured  with 1-2 (+) ankle/pedal edema. No aortic or femoral bruits. Chest: symmetric with normal excursions and percussion. Breasts: Symmetric, pendulous, without lumps, nipple discharge, retractions, or fibrocystic changes.  Abdomen: Flat, soft, with bowel sounds. Nontender, no guarding, rebound, hernias, masses, or organomegaly.  Lymphatics: Non tender without lymphadenopathy.  Musculoskeletal: Full ROM all peripheral extremities, joint stability, 5/5 strength, and gait stabilized by a cane. Skin: Warm and dry without rashes, lesions, cyanosis, clubbing or  ecchymosis.  Neuro: Cranial nerves intact, reflexes equal bilaterally. Normal muscle tone, no cerebellar symptoms. Sensation intact.  Pysch: Awake and oriented X 3, normal affect, Insight and Judgment appropriate.   Assessment and Plan  1. Essential hypertension  - Microalbumin / creatinine urine ratio - EKG 12-Lead - [place], RETROPERITNL  ABD,  LTD - TSH  2. Hyperlipidemia  - Lipid panel  3. Prediabetes  - Hemoglobin A1c - Insulin, random  4. Vitamin D deficiency  - Vit D  25 hydroxy   5. Hypothyroidism   6. Morbid Obesity (BMI 33.7)   7. Osteoporosis   8. Chronic bronchitis   9. Gastroesophageal reflux disease   10. Screening for rectal cancer  - POC Hemoccult Bld/Stl   11. Medication management  - Urinalysis, Routine w reflex microscopic  - CBC with Differential/Platelet - BASIC METABOLIC PANEL WITH GFR - Hepatic function panel - Magnesium   Continue prudent diet as discussed, weight control, BP monitoring, regular exercise, and  medications. Discussed med's effects and SE's. Screening labs and tests as requested with regular follow-up as recommended.  Over 40 minutes of exam, counseling, chart review was performed.

## 2015-08-16 LAB — VITAMIN D 25 HYDROXY (VIT D DEFICIENCY, FRACTURES): Vit D, 25-Hydroxy: 71 ng/mL (ref 30–100)

## 2015-08-16 LAB — URINALYSIS, ROUTINE W REFLEX MICROSCOPIC
Bilirubin Urine: NEGATIVE
Glucose, UA: NEGATIVE
Hgb urine dipstick: NEGATIVE
Ketones, ur: NEGATIVE
Leukocytes, UA: NEGATIVE
Nitrite: NEGATIVE
Protein, ur: NEGATIVE
Specific Gravity, Urine: 1.018 (ref 1.001–1.035)
pH: 7 (ref 5.0–8.0)

## 2015-08-16 LAB — MICROALBUMIN / CREATININE URINE RATIO
Creatinine, Urine: 137.5 mg/dL
Microalb Creat Ratio: 5.1 mg/g (ref 0.0–30.0)
Microalb, Ur: 0.7 mg/dL (ref <2.0)

## 2015-08-16 LAB — HEMOGLOBIN A1C
Hgb A1c MFr Bld: 5.7 % — ABNORMAL HIGH (ref <5.7)
Mean Plasma Glucose: 117 mg/dL — ABNORMAL HIGH (ref <117)

## 2015-08-16 LAB — INSULIN, RANDOM: Insulin: 6.1 u[IU]/mL (ref 2.0–19.6)

## 2015-08-17 ENCOUNTER — Other Ambulatory Visit: Payer: Self-pay | Admitting: Internal Medicine

## 2015-08-21 DIAGNOSIS — H40013 Open angle with borderline findings, low risk, bilateral: Secondary | ICD-10-CM | POA: Diagnosis not present

## 2015-08-21 DIAGNOSIS — H40033 Anatomical narrow angle, bilateral: Secondary | ICD-10-CM | POA: Diagnosis not present

## 2015-08-28 ENCOUNTER — Other Ambulatory Visit: Payer: Self-pay | Admitting: Internal Medicine

## 2015-09-02 DIAGNOSIS — I739 Peripheral vascular disease, unspecified: Secondary | ICD-10-CM | POA: Diagnosis not present

## 2015-09-02 DIAGNOSIS — L603 Nail dystrophy: Secondary | ICD-10-CM | POA: Diagnosis not present

## 2015-09-09 ENCOUNTER — Other Ambulatory Visit: Payer: Self-pay | Admitting: Internal Medicine

## 2015-09-14 ENCOUNTER — Encounter: Payer: Self-pay | Admitting: *Deleted

## 2015-09-17 ENCOUNTER — Ambulatory Visit (INDEPENDENT_AMBULATORY_CARE_PROVIDER_SITE_OTHER): Payer: Medicare Other | Admitting: *Deleted

## 2015-09-17 DIAGNOSIS — Z23 Encounter for immunization: Secondary | ICD-10-CM

## 2015-09-26 ENCOUNTER — Telehealth: Payer: Self-pay | Admitting: *Deleted

## 2015-09-26 NOTE — Telephone Encounter (Signed)
Patient called and states she has had diarrhea and has a rash in her vaginal area and between her legs.  Per Dr Melford Aase, try OTC zinc oxide and  Powder to the area.  Left a message on daughter's voice mail to inform patient.

## 2015-10-02 DIAGNOSIS — H40033 Anatomical narrow angle, bilateral: Secondary | ICD-10-CM | POA: Diagnosis not present

## 2015-10-02 DIAGNOSIS — H40013 Open angle with borderline findings, low risk, bilateral: Secondary | ICD-10-CM | POA: Diagnosis not present

## 2015-10-14 ENCOUNTER — Other Ambulatory Visit: Payer: Self-pay | Admitting: Physician Assistant

## 2015-10-21 ENCOUNTER — Other Ambulatory Visit: Payer: Self-pay | Admitting: *Deleted

## 2015-10-21 DIAGNOSIS — Z1212 Encounter for screening for malignant neoplasm of rectum: Secondary | ICD-10-CM

## 2015-10-21 LAB — POC HEMOCCULT BLD/STL (HOME/3-CARD/SCREEN)
Card #2 Fecal Occult Blod, POC: NEGATIVE
Card #3 Fecal Occult Blood, POC: NEGATIVE
Fecal Occult Blood, POC: NEGATIVE

## 2015-10-27 ENCOUNTER — Encounter: Payer: Self-pay | Admitting: *Deleted

## 2015-11-14 ENCOUNTER — Other Ambulatory Visit: Payer: Self-pay | Admitting: Internal Medicine

## 2015-11-19 ENCOUNTER — Other Ambulatory Visit: Payer: Self-pay | Admitting: Internal Medicine

## 2015-11-20 ENCOUNTER — Encounter: Payer: Self-pay | Admitting: Internal Medicine

## 2015-11-20 ENCOUNTER — Ambulatory Visit (INDEPENDENT_AMBULATORY_CARE_PROVIDER_SITE_OTHER): Payer: Medicare Other | Admitting: Internal Medicine

## 2015-11-20 DIAGNOSIS — I10 Essential (primary) hypertension: Secondary | ICD-10-CM | POA: Diagnosis not present

## 2015-11-20 DIAGNOSIS — Z79899 Other long term (current) drug therapy: Secondary | ICD-10-CM | POA: Diagnosis not present

## 2015-11-20 DIAGNOSIS — E038 Other specified hypothyroidism: Secondary | ICD-10-CM | POA: Diagnosis not present

## 2015-11-20 DIAGNOSIS — Z23 Encounter for immunization: Secondary | ICD-10-CM

## 2015-11-20 DIAGNOSIS — E785 Hyperlipidemia, unspecified: Secondary | ICD-10-CM

## 2015-11-20 DIAGNOSIS — E559 Vitamin D deficiency, unspecified: Secondary | ICD-10-CM

## 2015-11-20 DIAGNOSIS — R7303 Prediabetes: Secondary | ICD-10-CM | POA: Diagnosis not present

## 2015-11-20 NOTE — Progress Notes (Signed)
Patient ID: KAMBRYA TOAL, female   DOB: [birth year], 81 y.o.   MRN: [id number]  Assessment and Plan:  Hypertension:  -Continue medication,  -monitor blood pressure at home.  -Continue DASH diet.   -Reminder to go to the ER if any CP, SOB, nausea, dizziness, severe HA, changes vision/speech, left arm numbness and tingling, and jaw pain.  Cholesterol: -Continue diet and exercise.  -Check cholesterol.   Pre-diabetes: -Continue diet and exercise.  -Check A1C  Vitamin D Def: -check level -continue medications.   Depression -appears to be well controlled -asked her to keep an eye on things and if it gets worse to let us know.  Prevnar updated.  Continue diet and meds as discussed. Further disposition pending results of labs.  HPI 81 y.o. female  presents for 3 month follow up with hypertension, hyperlipidemia, prediabetes and vitamin D.   Her blood pressure has been controlled at home, today their BP is BP: (!) 144/70 mmHg.   She does not workout. She denies chest pain, shortness of breath, dizziness.  She is limited due to her leg pain.     She is on cholesterol medication and denies myalgias. Her cholesterol is at goal. The cholesterol last visit was:   Lab Results  Component Value Date   CHOL 157 08/15/2015   HDL 56 08/15/2015   LDLCALC 82 08/15/2015   TRIG 93 08/15/2015   CHOLHDL 2.8 08/15/2015     She has been working on diet and exercise for prediabetes, and denies foot ulcerations, hyperglycemia, hypoglycemia , increased appetite, nausea, paresthesia of the feet, polydipsia, polyuria, visual disturbances, vomiting and weight loss. Last A1C in the office was:  Lab Results  Component Value Date   HGBA1C 5.7* 08/15/2015    Patient is on Vitamin D supplement.  Lab Results  Component Value Date   VD25OH 43 08/15/2015     She reports that her legs haven't been having any swelling lately. She is elevating her legs.  She reports that she has been elevating using a  pillow.    She reports that she has been having some depression due to the holidays but she reports that she has been well controlled and her daughter has been helping out.     Current Medications:  Current Outpatient Prescriptions on File Prior to Visit  Medication Sig Dispense Refill  . acetaminophen (TYLENOL) 325 MG tablet Take 2 tablets (650 mg total) by mouth every 6 (six) hours as needed (prn TEMP>38.1 Celsius). 30 tablet 2  . busPIRone (BUSPAR) 10 MG tablet TAKE 1/2 - 1 TABLET 3 TIMES A DAY AS NEEDED FOR ANXIETY 270 tablet 1  . calcium-vitamin D (OSCAL WITH D) 250-125 MG-UNIT per tablet Take 1 tablet by mouth daily.    . Cholecalciferol (VITAMIN D3) 2000 UNITS TABS Take 4 tablets by mouth daily.     . citalopram (CELEXA) 20 MG tablet TAKE 1/2-1 TABLET BY MOUTH DAILY 90 tablet 1  . fexofenadine (ALLEGRA) 180 MG tablet Take 180 mg by mouth daily.     . fluconazole (DIFLUCAN) 150 MG tablet Take 1 tablet weekly for vaginal yeast infection 3 tablet 3  . hydrochlorothiazide (HYDRODIURIL) 25 MG tablet TAKE 1 TABLET (25 MG TOTAL) BY MOUTH DAILY. 30 tablet 3  . levothyroxine (SYNTHROID, LEVOTHROID) 50 MCG tablet TAKE 1 TABLET BY MOUTH DAILY 90 tablet 3  . magnesium oxide (MAG-OX) 400 (241.3 MG) MG tablet Take 1 tablet (400 mg total) by mouth 2 (two) times daily. 60 tablet 2  .  Multiple Vitamin (MULITIVITAMIN WITH MINERALS) TABS Take 1 tablet by mouth daily.    [name] Kitchen omeprazole (PRILOSEC) 40 MG capsule TAKE 1 CAPSULE DAILY 90 capsule 4  . Probiotic Product (PROBIOTIC FORMULA PO) Take 1 tablet by mouth daily.     [name] Kitchen terazosin (HYTRIN) 10 MG capsule TAKE 1 CAPSULE (10 MG TOTAL) BY MOUTH DAILY. 90 capsule 3  . traMADol (ULTRAM) 50 MG tablet TAKE 1 TABLET BY MOUTH 4 TIMES A DAY 100 tablet 2   No current facility-administered medications on file prior to visit.    Medical History:  Past Medical History  Diagnosis Date  . Hypertension   . Hyperlipidemia   . GERD (gastroesophageal reflux disease)    . COPD (chronic obstructive pulmonary disease) ([place])   . Osteoporosis   . Prediabetes   . Vitamin D deficiency     Allergies:  Allergies  Allergen Reactions  . Aspirin Swelling  . Celebrex [Celecoxib] Other (See Comments)    "my body lit up"  . Fosamax [Alendronate Sodium] Other (See Comments)    unknown  . Keflex [Cephalexin] Other (See Comments)    unknown  . Penicillins Other (See Comments)    unknown  . Prednisone Itching  . Prevacid [Lansoprazole] Other (See Comments)    unknown  . Sulfa Antibiotics Other (See Comments)    unknown  . Tetanus Toxoids Other (See Comments)    unknown  . Tetracyclines & Related Nausea Only    Nausea      Review of Systems:  Review of Systems  Constitutional: Negative for fever, chills and malaise/fatigue.  HENT: Negative for congestion, ear pain and sore throat.   Respiratory: Negative for cough, shortness of breath and wheezing.   Cardiovascular: Negative for chest pain, palpitations and leg swelling.  Gastrointestinal: Negative for heartburn, diarrhea, constipation, blood in stool and melena.  Genitourinary: Negative.   Skin: Negative.   Neurological: Negative for dizziness, sensory change, loss of consciousness and headaches.  Psychiatric/Behavioral: Positive for depression. The patient is not nervous/anxious and does not have insomnia.     Family history- Review and unchanged  Social history- Review and unchanged  Physical Exam: BP 144/70 mmHg  Pulse 78  Temp(Src) 97.8 F (36.6 C) (Temporal)  Resp 16  Ht 5\' 1"  (1.549 m)  Wt 177 lb (80.287 kg)  BMI 33.46 kg/m2 Wt Readings from Last 3 Encounters:  11/20/15 177 lb (80.287 kg)  08/15/15 178 lb 3.2 oz (80.831 kg)  05/14/15 176 lb (79.833 kg)    General Appearance: Well nourished well developed, in no apparent distress. Eyes: PERRLA, EOMs, conjunctiva no swelling or erythema ENT/Mouth: Ear canals normal without obstruction, swelling, erythma, discharge.  TMs normal  bilaterally.  Oropharynx moist, clear, without exudate, or postoropharyngeal swelling. Neck: Supple, thyroid normal,no cervical adenopathy  Respiratory: Respiratory effort normal, Breath sounds clear A&P without rhonchi, wheeze, or rale.  No retractions, no accessory usage. Cardio: RRR with no MRGs. Brisk peripheral pulses without edema.  Abdomen: Soft, + BS,  Non tender, no guarding, rebound, hernias, masses. Musculoskeletal: Full ROM, 5/5 strength, Normal gait Skin: Warm, dry without rashes, lesions, ecchymosis.  Neuro: Awake and oriented X 3, Cranial nerves intact. Normal muscle tone, no cerebellar symptoms. Psych: Normal affect, Insight and Judgment appropriate.    Starlyn Skeans, PA-C 2:48 PM [facility]

## 2015-11-21 DIAGNOSIS — I739 Peripheral vascular disease, unspecified: Secondary | ICD-10-CM | POA: Diagnosis not present

## 2015-11-21 DIAGNOSIS — L603 Nail dystrophy: Secondary | ICD-10-CM | POA: Diagnosis not present

## 2015-12-26 ENCOUNTER — Ambulatory Visit (INDEPENDENT_AMBULATORY_CARE_PROVIDER_SITE_OTHER): Payer: Medicare Other | Admitting: Physician Assistant

## 2015-12-26 ENCOUNTER — Encounter: Payer: Self-pay | Admitting: Physician Assistant

## 2015-12-26 VITALS — BP 120/70 | HR 105 | Temp 97.9°F | Resp 16 | Ht 61.0 in | Wt 168.0 lb

## 2015-12-26 DIAGNOSIS — R6889 Other general symptoms and signs: Secondary | ICD-10-CM | POA: Diagnosis not present

## 2015-12-26 DIAGNOSIS — E785 Hyperlipidemia, unspecified: Secondary | ICD-10-CM

## 2015-12-26 DIAGNOSIS — Z0001 Encounter for general adult medical examination with abnormal findings: Secondary | ICD-10-CM

## 2015-12-26 DIAGNOSIS — I83893 Varicose veins of bilateral lower extremities with other complications: Secondary | ICD-10-CM

## 2015-12-26 DIAGNOSIS — Z79899 Other long term (current) drug therapy: Secondary | ICD-10-CM

## 2015-12-26 DIAGNOSIS — J0100 Acute maxillary sinusitis, unspecified: Secondary | ICD-10-CM

## 2015-12-26 DIAGNOSIS — I10 Essential (primary) hypertension: Secondary | ICD-10-CM

## 2015-12-26 DIAGNOSIS — R7303 Prediabetes: Secondary | ICD-10-CM

## 2015-12-26 DIAGNOSIS — E038 Other specified hypothyroidism: Secondary | ICD-10-CM | POA: Diagnosis not present

## 2015-12-26 DIAGNOSIS — E559 Vitamin D deficiency, unspecified: Secondary | ICD-10-CM

## 2015-12-26 DIAGNOSIS — Z0000 Encounter for general adult medical examination without abnormal findings: Secondary | ICD-10-CM

## 2015-12-26 DIAGNOSIS — J42 Unspecified chronic bronchitis: Secondary | ICD-10-CM

## 2015-12-26 DIAGNOSIS — M810 Age-related osteoporosis without current pathological fracture: Secondary | ICD-10-CM

## 2015-12-26 DIAGNOSIS — K219 Gastro-esophageal reflux disease without esophagitis: Secondary | ICD-10-CM

## 2015-12-26 MED ORDER — AZITHROMYCIN 250 MG PO TABS: ORAL_TABLET | ORAL | Status: AC

## 2015-12-26 NOTE — Progress Notes (Signed)
MEDICARE ANNUAL WELLNESS VISIT AND FOLLOW UP  Assessment:   1. Essential hypertension - continue medications, DASH diet, exercise and monitor at home. Call if greater than 130/80.   2. Hypothyroidism Hypothyroidism-check TSH level, continue medications the same, reminded to take on an empty stomach 30-43mins before food.   3. Morbid obesity, unspecified obesity type ([place]) Obesity with co morbidities- long discussion about weight loss, diet, and exercise  4. Prediabetes Discussed general issues about diabetes pathophysiology and management., Educational material distributed., Suggested low cholesterol diet., Encouraged aerobic exercise., Discussed foot care., Reminded to get yearly retinal exam.  5. Hyperlipidemia -continue medications, check lipids, decrease fatty foods, increase activity.   6. Medication management  7. Vitamin D deficiency  8. Osteoporosis Declines medications  9. Gastroesophageal reflux disease, esophagitis presence not specified Continue PPI/H2 blocker, diet discussed  10. Chronic bronchitis, unspecified chronic bronchitis type ([place])  11. Varicose veins of bilateral lower extremities with other complications Continue compression stockings  12. Acute maxillary sinusitis, recurrence not specified - azithromycin (ZITHROMAX) 250 MG tablet; Take 2 tablets (500 mg) on  Day 1,  followed by 1 tablet (250 mg) once daily on Days 2 through 5.  Dispense: 6 each; Refill: 1   Plan:   During the course of the visit the patient was educated and counseled about appropriate screening and preventive services including:    Pneumococcal vaccine   Influenza vaccine  Td vaccine  Screening electrocardiogram  Screening mammography  Bone densitometry screening  Colorectal cancer screening  Diabetes screening  Glaucoma screening  Nutrition counseling   Advanced directives: given info/requested  Conditions/risks identified: BMI: Discussed weight loss, diet,  and increase physical activity.  Increase physical activity: AHA recommends 150 minutes of physical activity a week.  Medications reviewed DEXA- declined Diabetes is at goal, ACE/ARB therapy: Yes. Urinary Incontinence is an issue: discussed non pharmacology and pharmacology options.  Fall risk: moderate- discussed PT, home fall assessment, medications.    Subjective:   Alyssa Schmitt is a 82 y.o. female who presents for Medicare Annual Wellness Visit and cold.  Date of last medicare wellness visit was 02/2015  She has had cold symptoms x 1 week, sore throat, runny nose, no fever or chills, fatigue. Some sinus pressure/HA, no SOB, cough. Is on allergy pill daily.    Her blood pressure has been controlled at home, BP: 120/70 mmHg   She does not workout. She denies chest pain, shortness of breath, dizziness.  She is limited due to her leg pain.    She is on cholesterol medication and denies myalgias. Her cholesterol is at goal. The cholesterol last visit was:   Lab Results  Component Value Date   CHOL 157 08/15/2015   HDL 56 08/15/2015   LDLCALC 82 08/15/2015   TRIG 93 08/15/2015   CHOLHDL 2.8 08/15/2015    She has been working on diet and exercise for prediabetes, and denies foot ulcerations, hyperglycemia, hypoglycemia , increased appetite, nausea, paresthesia of the feet, polydipsia, polyuria, visual disturbances, vomiting and weight loss. Last A1C in the office was:  Lab Results  Component Value Date   HGBA1C 5.7* 08/15/2015   Patient is on Vitamin D supplement.  She walks with a walks, denies any falls.  She is on celexa for depression. She is on thyroid medication. Her medication was not changed last visit.   Lab Results  Component Value Date   TSH 2.652 08/15/2015    Names of Other Physician/Practitioners you currently use: 1. [facility]- here for primary care 2. unknown, dentist, last visit 2-3 years ago, has dentures Patient  Care Team: Unk Pinto, MD as PCP - General (Internal Medicine) Thayer Headings, MD (Cardiology) Unk Pinto, MD as Attending Physician (Internal Medicine) Ardis Hughs, PA-C as Physician Assistant (Physician Assistant) Johna Sheriff, MD as Consulting Physician (Ophthalmology) - 06/2015 Mal Misty, MD as Consulting Physician (Vascular Surgery) Mayme Genta, MD as Consulting Physician (Gastroenterology)  Medication Review Current Outpatient Prescriptions on File Prior to Visit  Medication Sig Dispense Refill  . acetaminophen (TYLENOL) 325 MG tablet Take 2 tablets (650 mg total) by mouth every 6 (six) hours as needed (prn TEMP>38.1 Celsius). 30 tablet 2  . busPIRone (BUSPAR) 10 MG tablet TAKE 1/2 - 1 TABLET 3 TIMES A DAY AS NEEDED FOR ANXIETY 270 tablet 1  . calcium-vitamin D (OSCAL WITH D) 250-125 MG-UNIT per tablet Take 1 tablet by mouth daily.    . Cholecalciferol (VITAMIN D3) 2000 UNITS TABS Take 4 tablets by mouth daily.     . citalopram (CELEXA) 20 MG tablet TAKE 1/2-1 TABLET BY MOUTH DAILY 90 tablet 1  . fexofenadine (ALLEGRA) 180 MG tablet Take 180 mg by mouth daily.     . fluconazole (DIFLUCAN) 150 MG tablet Take 1 tablet weekly for vaginal yeast infection 3 tablet 3  . hydrochlorothiazide (HYDRODIURIL) 25 MG tablet TAKE 1 TABLET (25 MG TOTAL) BY MOUTH DAILY. 30 tablet 3  . levothyroxine (SYNTHROID, LEVOTHROID) 50 MCG tablet TAKE 1 TABLET BY MOUTH DAILY 90 tablet 3  . magnesium oxide (MAG-OX) 400 (241.3 MG) MG tablet Take 1 tablet (400 mg total) by mouth 2 (two) times daily. 60 tablet 2  . Multiple Vitamin (MULITIVITAMIN WITH MINERALS) TABS Take 1 tablet by mouth daily.    [name] Kitchen omeprazole (PRILOSEC) 40 MG capsule TAKE 1 CAPSULE DAILY 90 capsule 4  . Probiotic Product (PROBIOTIC FORMULA PO) Take 1 tablet by mouth daily.     [name] Kitchen terazosin (HYTRIN) 10 MG capsule TAKE 1 CAPSULE (10 MG TOTAL) BY MOUTH DAILY. 90 capsule 3  . traMADol (ULTRAM) 50 MG tablet TAKE 1 TABLET BY  MOUTH 4 TIMES A DAY 100 tablet 2   No current facility-administered medications on file prior to visit.    Current Problems (verified) Patient Active Problem List   Diagnosis Date Noted  . COPD 08/15/2015  . GERD  08/15/2015  . Osteoporosis 07/16/2015  . Hypothyroidism   . Morbid Obesity (BMI 33.7) 08/01/2014  . Medication management 03/20/2014  . Hypertension   . Hyperlipidemia   . Prediabetes   . Vitamin D deficiency   . Varicose veins of lower extremities with other complications [id number]  . Ulcerated varicose veins of leg 04/13/2012    Screening Tests Health Maintenance  Topic Date Due  . TETANUS/TDAP  10/08/1947  . INFLUENZA VACCINE  07/07/2016  . DEXA SCAN  Completed  . ZOSTAVAX  Completed  . PNA vac Low Risk Adult  Completed     Immunization History  Administered Date(s) Administered  . Influenza, High Dose Seasonal PF 09/05/2014, 09/17/2015  . Pneumococcal Conjugate-13 11/20/2015  . Pneumococcal Polysaccharide-23 12/18/2007  . Zoster 07/25/2013    Preventative care: Last colonoscopy: 2001 will not get another Last mammogram:07/2015 Last pap smear/pelvic exam: Remote with Dr. Philis Pique   DEXA: 2016 T -3.1 Intolerant of medications Echo 2013  Prior vaccinations: TD or Tdap: ALLERGY  Influenza: 09/2015 Pneumococcal: 2009 Prevnar 13: 2016 Shingles/Zostavax: 2014  Allergies Allergies  Allergen Reactions  .  Aspirin Swelling  . Celebrex [Celecoxib] Other (See Comments)    "my body lit up"  . Fosamax [Alendronate Sodium] Other (See Comments)    unknown  . Keflex [Cephalexin] Other (See Comments)    unknown  . Penicillins Other (See Comments)    unknown  . Prednisone Itching  . Prevacid [Lansoprazole] Other (See Comments)    unknown  . Sulfa Antibiotics Other (See Comments)    unknown  . Tetanus Toxoids Other (See Comments)    unknown  . Tetracyclines & Related Nausea Only    Nausea    Surgical history Past Surgical History  Procedure  Laterality Date  . Tonsillectomy      childhood  . Vesicovaginal fistula closure w/ tah     Family history Family History  Problem Relation Age of Onset  . Heart disease Mother   . Other Mother     VARICOSE VEINS  . Diabetes Father   . Other Brother     VARICOSE VEINS    Risk Factors: Osteoporosis: postmenopausal estrogen deficiency and dietary calcium and/or vitamin D deficiency History of fracture in the past year: no  Tobacco Social History  Substance Use Topics  . Smoking status: Never Smoker   . Smokeless tobacco: Never Used  . Alcohol Use: No   She does not smoke.  Patient is not a former smoker. Are there smokers in your home (other than you)?  No  Alcohol Current alcohol use: none  Caffeine Current caffeine use: coffee 1 /day  Exercise Current exercise: none and but can still walk and be active around house  Nutrition/Diet Current diet: in general, a "healthy" diet    Cardiac risk factors: advanced age (older than 47 for men, 67 for women), dyslipidemia, hypertension, obesity (BMI >= 30 kg/m2) and sedentary lifestyle.  Depression Screen (Note: if answer to either of the following is "Yes", a more complete depression screening is indicated)   Q1: Over the past two weeks, have you felt down, depressed or hopeless?No  Q2: Over the past two weeks, have you felt little interest or pleasure in doing things? No  Have you lost interest or pleasure in daily life? No  Do you often feel hopeless? No  Do you cry easily over simple problems? No  Activities of Daily Living In your present state of health, do you have any difficulty performing the following activities?:  Driving? Yes Managing money?  No Feeding yourself? No Getting from bed to chair? Yes Climbing a flight of stairs? Yes Preparing food and eating?: No Bathing or showering? No Getting dressed: No Getting to the toilet? No Using the toilet:No Moving around from place to place: Yes In the past  year have you fallen or had a near fall?:No  Vision Difficulties: No  Hearing Difficulties: Yes Do you often ask people to speak up or repeat themselves? Yes Do you experience ringing or noises in your ears? No Do you have difficulty understanding soft or whispered voices? No  Cognition  Do you feel that you have a problem with memory?No  Do you often misplace items? No  Do you feel safe at home?  Yes  Advanced directives Does patient have a [place]? Yes Does patient have a Living Will? Yes   Objective:   Blood pressure 120/70, pulse 105, temperature 97.9 F (36.6 C), temperature source Temporal, resp. rate 16, height 5\' 1"  ([id number] m), weight 168 lb (76.204 kg), SpO2 95 %. Body mass index is 31.76 kg/(m^2).  Wt Readings from Last 3 Encounters:  12/26/15 168 lb (76.204 kg)  11/20/15 177 lb (80.287 kg)  08/15/15 178 lb 3.2 oz (80.831 kg)   General appearance: alert, no distress, WD/WN,  female Cognitive Testing  Alert? Yes  Normal Appearance?Yes  Oriented to person? Yes  Place? Yes   Time? Yes  Recall of three objects?  No  Can perform simple calculations? Yes  Displays appropriate judgment?Yes  Can read the correct time from a watch face?Yes  General Appearance: Well nourished, in no apparent distress.  Eyes: PERRLA, EOMs, conjunctiva no swelling or erythema, normal fundi and vessels.  Sinuses: + Frontal/maxillary tenderness  ENT/Mouth: Ext aud canals clear, with TMs without erythema, bulging. + erythema without swelling, or exudate on post pharynx but + mucus. Tonsils not swollen or erythematous. Hearing normal.  Neck: Supple, thyroid normal.  Respiratory: Respiratory effort normal, BS equal bilaterally without rales, rhonci, wheezing or stridor.  Cardio: Heart sounds normal, regular rate and rhythm with 4/6 systolic murmur but no rubs or gallops. Peripheral pulses brisk and equal bilaterally, without edema, hsa compression stockings on.  Abdomen:  Flat, soft, with bowel sounds. Nontender, no guarding, rebound, hernias, masses, or organomegaly.  Lymphatics: Non tender without lymphadenopathy.  Musculoskeletal: Full ROM all peripheral extremities, joint stability, 4/5 strength, and gait with walker Skin: Warm, dry without rashes, lesions, ecchymosis.  Neuro: Cranial nerves intact, reflexes equal bilaterally. Normal muscle tone, no cerebellar symptoms.  Pysch: Awake and oriented X 3, normal affect, Insight and Judgment appropriate.  Breast: defer Gyn: defer Rectal: defer  Medicare Attestation I have personally reviewed: The patient's medical and social history Their use of alcohol, tobacco or illicit drugs Their current medications and supplements The patient's functional ability including ADLs,fall risks, home safety risks, cognitive, and hearing and visual impairment Diet and physical activities Evidence for depression or mood disorders  The patient's weight, height, BMI, and visual acuity have been recorded in the chart.  I have made referrals, counseling, and provided education to the patient based on review of the above and I have provided the patient with a written personalized care plan for preventive services.     Vicie Mutters, PA-C   12/26/2015

## 2015-12-26 NOTE — Patient Instructions (Signed)

## 2016-01-06 ENCOUNTER — Other Ambulatory Visit: Payer: Self-pay | Admitting: Internal Medicine

## 2016-01-29 DIAGNOSIS — H40013 Open angle with borderline findings, low risk, bilateral: Secondary | ICD-10-CM | POA: Diagnosis not present

## 2016-01-29 DIAGNOSIS — H40033 Anatomical narrow angle, bilateral: Secondary | ICD-10-CM | POA: Diagnosis not present

## 2016-02-03 DIAGNOSIS — I739 Peripheral vascular disease, unspecified: Secondary | ICD-10-CM | POA: Diagnosis not present

## 2016-02-03 DIAGNOSIS — L603 Nail dystrophy: Secondary | ICD-10-CM | POA: Diagnosis not present

## 2016-02-09 ENCOUNTER — Other Ambulatory Visit: Payer: Self-pay | Admitting: Internal Medicine

## 2016-02-18 DIAGNOSIS — H40033 Anatomical narrow angle, bilateral: Secondary | ICD-10-CM | POA: Diagnosis not present

## 2016-02-18 DIAGNOSIS — H40013 Open angle with borderline findings, low risk, bilateral: Secondary | ICD-10-CM | POA: Diagnosis not present

## 2016-02-19 ENCOUNTER — Ambulatory Visit: Payer: Self-pay | Admitting: Internal Medicine

## 2016-02-25 DIAGNOSIS — M1711 Unilateral primary osteoarthritis, right knee: Secondary | ICD-10-CM | POA: Diagnosis not present

## 2016-03-04 DIAGNOSIS — H40031 Anatomical narrow angle, right eye: Secondary | ICD-10-CM | POA: Diagnosis not present

## 2016-03-17 ENCOUNTER — Encounter: Payer: Self-pay | Admitting: Internal Medicine

## 2016-03-17 ENCOUNTER — Ambulatory Visit (INDEPENDENT_AMBULATORY_CARE_PROVIDER_SITE_OTHER): Payer: Medicare Other | Admitting: Internal Medicine

## 2016-03-17 VITALS — BP 130/74 | HR 84 | Temp 97.5°F | Resp 16 | Ht 61.0 in | Wt 177.4 lb

## 2016-03-17 DIAGNOSIS — Z79899 Other long term (current) drug therapy: Secondary | ICD-10-CM | POA: Diagnosis not present

## 2016-03-17 DIAGNOSIS — E038 Other specified hypothyroidism: Secondary | ICD-10-CM

## 2016-03-17 DIAGNOSIS — I10 Essential (primary) hypertension: Secondary | ICD-10-CM | POA: Diagnosis not present

## 2016-03-17 DIAGNOSIS — E559 Vitamin D deficiency, unspecified: Secondary | ICD-10-CM | POA: Diagnosis not present

## 2016-03-17 DIAGNOSIS — R7303 Prediabetes: Secondary | ICD-10-CM | POA: Diagnosis not present

## 2016-03-17 DIAGNOSIS — K219 Gastro-esophageal reflux disease without esophagitis: Secondary | ICD-10-CM | POA: Diagnosis not present

## 2016-03-17 DIAGNOSIS — E785 Hyperlipidemia, unspecified: Secondary | ICD-10-CM | POA: Diagnosis not present

## 2016-03-17 DIAGNOSIS — R7309 Other abnormal glucose: Secondary | ICD-10-CM | POA: Diagnosis not present

## 2016-03-17 LAB — CBC WITH DIFFERENTIAL/PLATELET
Basophils Absolute: 56 cells/uL (ref 0–200)
Basophils Relative: 1 %
Eosinophils Absolute: 56 cells/uL (ref 15–500)
Eosinophils Relative: 1 %
HCT: 39.4 % (ref 35.0–45.0)
Hemoglobin: 13 g/dL (ref 11.7–15.5)
Lymphocytes Relative: 43 %
Lymphs Abs: 2408 cells/uL (ref 850–3900)
MCH: 29.7 pg (ref 27.0–33.0)
MCHC: 33 g/dL (ref 32.0–36.0)
MCV: 90 fL (ref 80.0–100.0)
MPV: 10.1 fL (ref 7.5–12.5)
Monocytes Absolute: 448 cells/uL (ref 200–950)
Monocytes Relative: 8 %
Neutro Abs: 2632 cells/uL (ref 1500–7800)
Neutrophils Relative %: 47 %
Platelets: 173 10*3/uL (ref 140–400)
RBC: 4.38 MIL/uL (ref 3.80–5.10)
RDW: 14 % (ref 11.0–15.0)
WBC: 5.6 10*3/uL (ref 3.8–10.8)

## 2016-03-17 LAB — MAGNESIUM: Magnesium: 1.8 mg/dL (ref 1.5–2.5)

## 2016-03-17 LAB — LIPID PANEL
Cholesterol: 173 mg/dL (ref 125–200)
HDL: 75 mg/dL (ref >=46)
LDL Cholesterol: 82 mg/dL (ref <130)
Total CHOL/HDL Ratio: 2.3 Ratio (ref <=5.0)
Triglycerides: 78 mg/dL (ref <150)
VLDL: 16 mg/dL (ref <30)

## 2016-03-17 LAB — BASIC METABOLIC PANEL WITH GFR
BUN: 23 mg/dL (ref 7–25)
CO2: 30 mmol/L (ref 20–31)
Calcium: 9.5 mg/dL (ref 8.6–10.4)
Chloride: 98 mmol/L (ref 98–110)
Creat: 0.98 mg/dL — ABNORMAL HIGH (ref 0.60–0.88)
GFR, Est African American: 61 mL/min (ref >=60)
GFR, Est Non African American: 53 mL/min — ABNORMAL LOW (ref >=60)
Glucose, Bld: 80 mg/dL (ref 65–99)
Potassium: 4 mmol/L (ref 3.5–5.3)
Sodium: 137 mmol/L (ref 135–146)

## 2016-03-17 LAB — TSH: TSH: 3.22 mIU/L

## 2016-03-17 LAB — HEPATIC FUNCTION PANEL
ALT: 23 U/L (ref 6–29)
AST: 33 U/L (ref 10–35)
Albumin: 4.1 g/dL (ref 3.6–5.1)
Alkaline Phosphatase: 49 U/L (ref 33–130)
Bilirubin, Direct: 0.2 mg/dL (ref <=0.2)
Indirect Bilirubin: 0.4 mg/dL (ref 0.2–1.2)
Total Bilirubin: 0.6 mg/dL (ref 0.2–1.2)
Total Protein: 7.6 g/dL (ref 6.1–8.1)

## 2016-03-17 LAB — HEMOGLOBIN A1C
Hgb A1c MFr Bld: 5.4 % (ref <5.7)
Mean Plasma Glucose: 108 mg/dL

## 2016-03-17 NOTE — Patient Instructions (Signed)

## 2016-03-17 NOTE — Progress Notes (Signed)
Patient ID: Alyssa Schmitt, female   DOB: 21-Feb-1928, 82 y.o.   MRN: [id number]   This very nice 82 y.o. [facility] presents for 6 month follow up with Hypertension, Hyperlipidemia, Pre-Diabetes and Vitamin D Deficiency.    Patient is treated for HTN since circa 1990 & BP has been controlled at home. Today's BP: 130/74 mmHg. Patient has had no complaints of any cardiac type chest pain, palpitations, dyspnea/orthopnea/PND, dizziness, claudication, but she does have chronic venous insufficiency with dependent edema.   Hyperlipidemia is controlled with diet & meds. Patient denies myalgias or other med SE's. Current Lipids are at goal with Cholesterol 173; HDL 75; LDL 82; and Triglycerides 78.    Also, the patient has history of Morbid Obesity (BMI 33+) and consequent PreDiabetes with A1c 5.7% in 2014 and has had no symptoms of reactive hypoglycemia, diabetic polys, paresthesias or visual blurring.  Current A1c is at goal with A1c 5.4%.   Further, the patient also has history of Vitamin D Deficiency and supplements vitamin D without any suspected side-effects. Last vitamin D was 71 on 08/15/2015.     Medication Sig  . acetaminophen  325 MG tablet Take 2 tablets (650 mg total) by mouth every 6 (six) hours as needed (prn TEMP>38.1 Celsius).  . busPIRone  10 MG tablet TAKE 1/2 - 1 TABLET 3 TIMES A DAY AS NEEDED FOR ANXIETY  . OSCAL w/ D 250-125 MG-UNIT  Take 1 tablet by mouth daily.  [name] Kitchen VITAMIN D 2000 UNITS  Take 4 tablets by mouth daily.   . Citalopram 20 MG tablet TAKE 1/2-1 TABLET BY MOUTH DAILY  . Fexofenadine 180 MG tablet Take 180 mg by mouth daily.   . fluconazole 150 MG tablet Take 1 tablet weekly for vaginal yeast infection prn  . hydrochlorothiazide25 MG tablet TAKE 1 TABLET (25 MG TOTAL) BY MOUTH DAILY.  [name] Kitchen levothyroxine  50 MCG tablet TAKE 1 TABLET BY MOUTH DAILY  . magnesium  400  MG Take 1 tablet (400 mg total) by mouth 2 (two) times daily.  [name] WITH MINERALS Take 1 tablet by mouth  daily.  [name] Kitchen omeprazole  40 MG capsule TAKE 1 CAPSULE DAILY  . PROBIOTIC FORMULA  Take 1 tablet by mouth daily.   [name] Kitchen terazosin (HYTRIN) 10 MG capsule TAKE 1 CAPSULE (10 MG TOTAL) BY MOUTH DAILY.  . traMADol (ULTRAM) 50 MG tablet TAKE 1 TABLET BY MOUTH 4 TIMES A DAY   Allergies  Allergen Reactions  . Aspirin Swelling  . Celebrex [Celecoxib] Other (See Comments)    "my body lit up"  . Fosamax [Alendronate Sodium] Other (See Comments)    unknown  . Keflex [Cephalexin] Other (See Comments)    unknown  . Penicillins Other (See Comments)    unknown  . Prednisone Itching  . Prevacid [Lansoprazole] Other (See Comments)    unknown  . Sulfa Antibiotics Other (See Comments)    unknown  . Tetanus Toxoids Other (See Comments)    unknown  . Tetracyclines & Related Nausea Only    Nausea    PMHx:   Past Medical History  Diagnosis Date  . Hypertension   . Hyperlipidemia   . GERD (gastroesophageal reflux disease)   . COPD (chronic obstructive pulmonary disease) ([place])   . Osteoporosis   . Prediabetes   . Vitamin D deficiency    Immunization History  Administered Date(s) Administered  . Influenza, High Dose Seasonal PF 09/05/2014, 09/17/2015  . Pneumococcal Conjugate-13 11/20/2015  . Pneumococcal Polysaccharide-23 12/18/2007  .  Zoster 07/25/2013   Past Surgical History  Procedure Laterality Date  . Tonsillectomy      childhood  . Vesicovaginal fistula closure w/ tah     FHx:    Reviewed / unchanged  SHx:    Reviewed / unchanged  Systems Review:  Constitutional: Denies fever, chills, wt changes, headaches, insomnia, fatigue, night sweats, change in appetite. Eyes: Denies redness, blurred vision, diplopia, discharge, itchy, watery eyes.  ENT: Denies discharge, congestion, post nasal drip, epistaxis, sore throat, earache, hearing loss, dental pain, tinnitus, vertigo, sinus pain, snoring.  CV: Denies chest pain, palpitations, irregular heartbeat, syncope, dyspnea, diaphoresis,  orthopnea, PND, claudication or edema. Respiratory: denies cough, dyspnea, DOE, pleurisy, hoarseness, laryngitis, wheezing.  Gastrointestinal: Denies dysphagia, odynophagia, heartburn, reflux, water brash, abdominal pain or cramps, nausea, vomiting, bloating, diarrhea, constipation, hematemesis, melena, hematochezia  or hemorrhoids. Genitourinary: Denies dysuria, frequency, urgency, nocturia, hesitancy, discharge, hematuria or flank pain. Musculoskeletal: Denies arthralgias, myalgias, stiffness, jt. swelling, pain, limping or strain/sprain.  Skin: Denies pruritus, rash, hives, warts, acne, eczema or change in skin lesion(s). Neuro: No weakness, tremor, incoordination, spasms, paresthesia or pain. Psychiatric: Denies confusion, memory loss or sensory loss. Endo: Denies change in weight, skin or hair change.  Heme/Lymph: No excessive bleeding, bruising or enlarged lymph nodes.  Physical Exam  BP 130/74 mmHg  Pulse 84  Temp(Src) 97.5 F (36.4 C)  Resp 16  Ht 5\' 1"  (1.549 m)  Wt 177 lb 6.4 oz (80.468 kg)  BMI 33.54 kg/m2  Appears well nourished and in no distress.  Eyes: PERRLA, EOMs, conjunctiva no swelling or erythema. Sinuses: No frontal/maxillary tenderness ENT/Mouth: EAC's clear, TM's nl w/o erythema, bulging. Nares clear w/o erythema, swelling, exudates. Oropharynx clear without erythema or exudates. Oral hygiene is good. Tongue normal, non obstructing. Hearing intact.  Neck: Supple. Thyroid nl. Car 2+/2+ without bruits, nodes or JVD. Chest: Respirations nl with BS clear & equal w/o rales, rhonchi, wheezing or stridor.  Cor: Heart sounds normal w/ regular rate and rhythm without sig. murmurs, gallops, clicks, or rubs. Peripheral pulses normal and equal  With 1-2 + ankle  edema.  Abdomen: Soft & bowel sounds normal. Non-tender w/o guarding, rebound, hernias, masses, or organomegaly.  Lymphatics: Unremarkable.  Musculoskeletal: Full ROM all peripheral extremities, joint stability, 5/5  strength, and normal gait.  Skin: Warm, dry without exposed rashes, lesions or ecchymosis apparent.  Neuro: Cranial nerves intact, reflexes equal bilaterally. Sensory-motor testing grossly intact. Tendon reflexes grossly intact.  Pysch: Alert & oriented x 3.  Insight and judgement nl & appropriate. No ideations.  Assessment and Plan:  1. Essential hypertension  - TSH  2. Hyperlipidemia  - Lipid panel - TSH  3. Prediabetes  - Hemoglobin A1c - Insulin, random  4. Vitamin D deficiency  - VITAMIN D 25 Hydroxy   5. Hypothyroidism   6. Gastroesophageal reflux disease   7. Medication management  - CBC with Differential/Platelet - BASIC METABOLIC PANEL WITH GFR - Hepatic function panel - Magnesium   Recommended regular exercise, BP monitoring, weight control, and discussed med and SE's. Recommended labs to assess and monitor clinical status. Further disposition pending results of labs. Over 30 minutes of exam, counseling, chart review was performed

## 2016-03-18 LAB — INSULIN, RANDOM: Insulin: 6.9 u[IU]/mL (ref 2.0–19.6)

## 2016-03-18 LAB — VITAMIN D 25 HYDROXY (VIT D DEFICIENCY, FRACTURES): Vit D, 25-Hydroxy: 79 ng/mL (ref 30–100)

## 2016-04-03 ENCOUNTER — Other Ambulatory Visit: Payer: Self-pay | Admitting: Internal Medicine

## 2016-04-13 DIAGNOSIS — L603 Nail dystrophy: Secondary | ICD-10-CM | POA: Diagnosis not present

## 2016-04-13 DIAGNOSIS — I739 Peripheral vascular disease, unspecified: Secondary | ICD-10-CM | POA: Diagnosis not present

## 2016-04-15 DIAGNOSIS — H40013 Open angle with borderline findings, low risk, bilateral: Secondary | ICD-10-CM | POA: Diagnosis not present

## 2016-04-15 DIAGNOSIS — H04123 Dry eye syndrome of bilateral lacrimal glands: Secondary | ICD-10-CM | POA: Diagnosis not present

## 2016-04-15 DIAGNOSIS — H40033 Anatomical narrow angle, bilateral: Secondary | ICD-10-CM | POA: Diagnosis not present

## 2016-04-19 ENCOUNTER — Other Ambulatory Visit: Payer: Self-pay | Admitting: Physician Assistant

## 2016-04-23 ENCOUNTER — Encounter: Payer: Self-pay | Admitting: Internal Medicine

## 2016-04-23 ENCOUNTER — Ambulatory Visit (INDEPENDENT_AMBULATORY_CARE_PROVIDER_SITE_OTHER): Payer: Medicare Other | Admitting: Internal Medicine

## 2016-04-23 VITALS — BP 128/62 | HR 80 | Temp 98.0°F | Resp 16 | Ht 61.0 in | Wt 180.0 lb

## 2016-04-23 DIAGNOSIS — J069 Acute upper respiratory infection, unspecified: Secondary | ICD-10-CM | POA: Diagnosis not present

## 2016-04-23 MED ORDER — DEXAMETHASONE SODIUM PHOSPHATE 100 MG/10ML IJ SOLN
10.0000 mg | Freq: Once | INTRAMUSCULAR | Status: AC
  Administered 2016-04-23: 10 mg via INTRAMUSCULAR

## 2016-04-23 MED ORDER — FLUTICASONE PROPIONATE 50 MCG/ACT NA SUSP: 2.0000 | Freq: Every day | NASAL | Status: DC

## 2016-04-23 MED ORDER — PSEUDOEPH-BROMPHEN-DM 30-2-10 MG/5ML PO SYRP: 5.0000 mL | ORAL_SOLUTION | Freq: Three times a day (TID) | ORAL | Status: DC | PRN

## 2016-04-23 NOTE — Progress Notes (Signed)
Patient ID: Alyssa Schmitt, female   DOB: 10-Sep-1928, 82 y.o.   MRN: [id number] HPI  Patient presents to the office for evaluation of cough.  It has been going on for 2 days.  Patient reports all the time, dry cough.  They also endorse change in voice, postnasal drip and sore throat.  .  They have tried none.  They report that nothing has worked.  They admits to other sick contacts.  She reports that her granddaughter and great grandson have an upper respiratory infection.    Review of Systems  Constitutional: Negative for fever, chills and malaise/fatigue.  HENT: Positive for congestion and sore throat. Negative for ear pain.   Respiratory: Positive for cough. Negative for shortness of breath and wheezing.   Cardiovascular: Negative for chest pain, palpitations and leg swelling.  Skin: Negative.   Neurological: Negative for headaches.    PE:  Filed Vitals:   04/23/16 1400  BP: 128/62  Pulse: 80  Temp: 98 F (36.7 C)  Resp: 16    General:  Alert and non-toxic, WDWN, NAD HEENT: NCAT, PERLA, EOM normal, no occular discharge or erythema.  Nasal mucosal edema with sinus tenderness to palpation.  Oropharynx clear with minimal oropharyngeal edema and erythema.  Mucous membranes moist and pink. Neck:  Cervical adenopathy Chest:  RRR no MRGs.  Lungs clear to auscultation A&P with no wheezes rhonchi or rales.   Abdomen: +BS x 4 quadrants, soft, non-tender, no guarding, rigidity, or rebound. Skin: warm and dry no rash Neuro: A&Ox4, CN II-XII grossly intact  Assessment and Plan:   1. Acute URI -allegra  - fluticasone (FLONASE) 50 MCG/ACT nasal spray; Place 2 sprays into both nostrils daily.  Dispense: 16 g; Refill: 0 - brompheniramine-pseudoephedrine-DM 30-2-10 MG/5ML syrup; Take 5 mLs by mouth 3 (three) times daily as needed.  Dispense: 473 mL; Refill: 0 - dexamethasone (DECADRON) injection 10 mg; Inject 1 mL (10 mg total) into the muscle once.

## 2016-04-27 ENCOUNTER — Ambulatory Visit (INDEPENDENT_AMBULATORY_CARE_PROVIDER_SITE_OTHER): Payer: Medicare Other | Admitting: Internal Medicine

## 2016-04-27 ENCOUNTER — Encounter: Payer: Self-pay | Admitting: Internal Medicine

## 2016-04-27 VITALS — BP 108/60 | HR 86 | Temp 98.6°F | Resp 16 | Ht 61.0 in

## 2016-04-27 DIAGNOSIS — J069 Acute upper respiratory infection, unspecified: Secondary | ICD-10-CM

## 2016-04-27 MED ORDER — AZITHROMYCIN 250 MG PO TABS: ORAL_TABLET | ORAL | Status: DC

## 2016-04-27 MED ORDER — FLUTICASONE FUROATE-VILANTEROL 100-25 MCG/INH IN AEPB: 1.0000 | INHALATION_SPRAY | Freq: Every day | RESPIRATORY_TRACT | Status: DC

## 2016-04-27 NOTE — Progress Notes (Signed)
HPI  Patient presents to the office for evaluation of cough.  It has been going on for 1 weeks.  Patient reports night > day, wet, barky, worse with lying down.  They also endorse change in voice, chills, fever, postnasal drip and sinus congestion, rhinorrhea, headache.  .  They have tried antitussives or decadron.  They report that nothing has worked.  They admits to other sick contacts.  Her temperatures have been running 99.4, 99.7.  She has been taking tylenol to help with this.  She was sweating a lot.  She has not been drinking a lot.    Review of Systems  Constitutional: Positive for fever, chills and malaise/fatigue.  HENT: Positive for congestion and sore throat. Negative for ear pain.   Respiratory: Positive for cough. Negative for shortness of breath and wheezing.   Cardiovascular: Negative for chest pain, palpitations and leg swelling.  Neurological: Positive for headaches.    PE:  Filed Vitals:   04/27/16 0949  BP: 108/60  Pulse: 86  Temp: 98.6 F (37 C)  Resp: 16   General:  Alert and non-toxic, WDWN, NAD HEENT: NCAT, PERLA, EOM normal, no occular discharge or erythema.  Nasal mucosal edema with sinus tenderness to palpation.  Oropharynx clear with minimal oropharyngeal edema and erythema.  Mucous membranes moist and pink. Neck:  Cervical adenopathy Chest:  RRR no RGs. 3/6 precordial systolic murmur Lungs clear to auscultation A&P with no wheezes rhonchi or rales.   Abdomen: +BS x 4 quadrants, soft, non-tender, no guarding, rigidity, or rebound. Skin: warm and dry no rash Neuro: A&Ox4, CN II-XII grossly intact  Assessment and Plan:   1. Acute URI -fill cough syrup -tylenol as needed -push fluids - azithromycin (ZITHROMAX Z-PAK) 250 MG tablet; 2 po day one, then 1 daily x 4 days  Dispense: 6 tablet; Refill: 1 - fluticasone furoate-vilanterol (BREO ELLIPTA) 100-25 MCG/INH AEPB; Inhale 1 puff into the lungs daily.  Dispense: 1 each; Refill: 0

## 2016-04-29 ENCOUNTER — Telehealth: Payer: Self-pay | Admitting: *Deleted

## 2016-04-29 NOTE — Telephone Encounter (Signed)
Patient's daughter, [name], called and was concerned about reaction to brompheniramine-pseudoephedrine-DM 30-2-10.  Daughter states patient told her she was "seeing things and  having hallucinations".  [name] checked her temp to see if she possibly had a fever and [name] states her temp was normal.  Per Starlyn Skeans, PA-C I advised [name] to d/c the cough syrup Rx, continue her Zpak and inhaler AD and to call us if any more concerns or if patient continues to hallucinate. [name] expressed understanding and states she will call us with update on patient tomorrow.

## 2016-05-01 ENCOUNTER — Emergency Department (HOSPITAL_COMMUNITY): Payer: Medicare Other

## 2016-05-01 ENCOUNTER — Encounter (HOSPITAL_BASED_OUTPATIENT_CLINIC_OR_DEPARTMENT_OTHER): Payer: Self-pay

## 2016-05-01 ENCOUNTER — Emergency Department (HOSPITAL_BASED_OUTPATIENT_CLINIC_OR_DEPARTMENT_OTHER): Payer: Medicare Other

## 2016-05-01 ENCOUNTER — Emergency Department (HOSPITAL_BASED_OUTPATIENT_CLINIC_OR_DEPARTMENT_OTHER)
Admission: EM | Admit: 2016-05-01 | Discharge: 2016-05-01 | Disposition: A | Discharge status: Home / Self Care | Payer: Medicare Other | Attending: Emergency Medicine | Admitting: Emergency Medicine

## 2016-05-01 DIAGNOSIS — R443 Hallucinations, unspecified: Secondary | ICD-10-CM | POA: Insufficient documentation

## 2016-05-01 DIAGNOSIS — I10 Essential (primary) hypertension: Secondary | ICD-10-CM | POA: Diagnosis not present

## 2016-05-01 DIAGNOSIS — R059 Cough, unspecified: Secondary | ICD-10-CM

## 2016-05-01 DIAGNOSIS — R509 Fever, unspecified: Secondary | ICD-10-CM | POA: Diagnosis present

## 2016-05-01 DIAGNOSIS — E876 Hypokalemia: Secondary | ICD-10-CM | POA: Insufficient documentation

## 2016-05-01 DIAGNOSIS — J189 Pneumonia, unspecified organism: Secondary | ICD-10-CM

## 2016-05-01 DIAGNOSIS — E785 Hyperlipidemia, unspecified: Secondary | ICD-10-CM | POA: Diagnosis not present

## 2016-05-01 DIAGNOSIS — R05 Cough: Secondary | ICD-10-CM

## 2016-05-01 DIAGNOSIS — J449 Chronic obstructive pulmonary disease, unspecified: Secondary | ICD-10-CM | POA: Insufficient documentation

## 2016-05-01 LAB — CBC WITH DIFFERENTIAL/PLATELET
Basophils Absolute: 0 10*3/uL (ref 0.0–0.1)
Basophils Relative: 0 %
Eosinophils Absolute: 0 10*3/uL (ref 0.0–0.7)
Eosinophils Relative: 0 %
HCT: 35 % — ABNORMAL LOW (ref 36.0–46.0)
Hemoglobin: 12 g/dL (ref 12.0–15.0)
Lymphocytes Relative: 14 %
Lymphs Abs: 1.1 10*3/uL (ref 0.7–4.0)
MCH: 30.5 pg (ref 26.0–34.0)
MCHC: 34.3 g/dL (ref 30.0–36.0)
MCV: 89.1 fL (ref 78.0–100.0)
Monocytes Absolute: 0.4 10*3/uL (ref 0.1–1.0)
Monocytes Relative: 5 %
Neutro Abs: 6.4 10*3/uL (ref 1.7–7.7)
Neutrophils Relative %: 81 %
Platelets: 221 10*3/uL (ref 150–400)
RBC: 3.93 MIL/uL (ref 3.87–5.11)
RDW: 13.5 % (ref 11.5–15.5)
WBC: 7.9 10*3/uL (ref 4.0–10.5)

## 2016-05-01 LAB — URINALYSIS, ROUTINE W REFLEX MICROSCOPIC
Bilirubin Urine: NEGATIVE
Glucose, UA: NEGATIVE mg/dL
Ketones, ur: NEGATIVE mg/dL
Nitrite: NEGATIVE
Protein, ur: NEGATIVE mg/dL
Specific Gravity, Urine: 1.008 (ref 1.005–1.030)
pH: 7 (ref 5.0–8.0)

## 2016-05-01 LAB — URINE MICROSCOPIC-ADD ON

## 2016-05-01 LAB — BASIC METABOLIC PANEL
Anion gap: 9 (ref 5–15)
BUN: 13 mg/dL (ref 6–20)
CO2: 29 mmol/L (ref 22–32)
Calcium: 9 mg/dL (ref 8.9–10.3)
Chloride: 95 mmol/L — ABNORMAL LOW (ref 101–111)
Creatinine, Ser: 0.85 mg/dL (ref 0.44–1.00)
GFR calc Af Amer: >60 mL/min (ref >60)
GFR calc non Af Amer: >60 mL/min (ref >60)
Glucose, Bld: 100 mg/dL — ABNORMAL HIGH (ref 65–99)
Potassium: 3.2 mmol/L — ABNORMAL LOW (ref 3.5–5.1)
Sodium: 133 mmol/L — ABNORMAL LOW (ref 135–145)

## 2016-05-01 MED ORDER — POTASSIUM CHLORIDE CRYS ER 20 MEQ PO TBCR
40.0000 meq | EXTENDED_RELEASE_TABLET | Freq: Once | ORAL | Status: AC
  Administered 2016-05-01: 40 meq via ORAL
  Filled 2016-05-01: qty 2

## 2016-05-01 NOTE — ED Notes (Signed)
Patient transported to X-ray 

## 2016-05-01 NOTE — ED Notes (Signed)
Attempted in and out cath, due to pt anatomy of prolapsed bladder, unable to collect urine sample at this time.  2 nurses (Antonino Nienhuis, rn and brandi leak, rn) attempted.  Dr. Winfred Leeds notified.

## 2016-05-01 NOTE — ED Notes (Signed)
Pt. returned from XR. 

## 2016-05-01 NOTE — ED Notes (Signed)
MD at bedside. 

## 2016-05-01 NOTE — Discharge Instructions (Signed)
Community-Acquired Pneumonia, Adult Call Dr. Idell Pickles office to arrange to be seen if still coughing or if you still have fever by next week. Return to the emergency department for difficulty breathing, vomiting or if concerned for any reason. Your blood potassium was slightly low today at 3.2. Have Dr.McKeown recheck your blood potassium within the next 7-10 days in his office. Pneumonia is an infection of the lungs. One type of pneumonia can happen while a person is in a hospital. A different type can happen when a person is not in a hospital (community-acquired pneumonia). It is easy for this kind to spread from person to person. It can spread to you if you breathe near an infected person who coughs or sneezes. Some symptoms include:  A dry cough.  A wet (productive) cough.  Fever.  Sweating.  Chest pain. HOME CARE  Take over-the-counter and prescription medicines only as told by your doctor.  Only take cough medicine if you are losing sleep.  If you were prescribed an antibiotic medicine, take it as told by your doctor. Do not stop taking the antibiotic even if you start to feel better.  Sleep with your head and neck raised (elevated). You can do this by putting a few pillows under your head, or you can sleep in a recliner.  Do not use tobacco products. These include cigarettes, chewing tobacco, and e-cigarettes. If you need help quitting, ask your doctor.  Drink enough water to keep your pee (urine) clear or pale yellow. A shot (vaccine) can help prevent pneumonia. Shots are often suggested for:  People older than 80 years of age.  People older than 80 years of age:  Who are having cancer treatment.  Who have long-term (chronic) lung disease.  Who have problems with their body's defense system (immune system). You may also prevent pneumonia if you take these actions:  Get the flu (influenza) shot every year.  Go to the dentist as often as told.  Wash your hands often.  If soap and water are not available, use hand sanitizer. GET HELP IF:  You have a fever.  You lose sleep because your cough medicine does not help. GET HELP RIGHT AWAY IF:  You are short of breath and it gets worse.  You have more chest pain.  Your sickness gets worse. This is very serious if:  You are an older adult.  Your body's defense system is weak.  You cough up blood.   This information is not intended to replace advice given to you by your health care provider. Make sure you discuss any questions you have with your health care provider.   Document Released: 05/11/2008 Document Revised: 08/14/2015 Document Reviewed: 03/20/2015 Elsevier Interactive Patient Education [employer].

## 2016-05-01 NOTE — ED Notes (Signed)
Pt made aware to return if symptoms worsen or if any life threatening symptoms occur.   

## 2016-05-01 NOTE — ED Notes (Signed)
Pt placed on 2L [place] for 02 sats 88%.

## 2016-05-01 NOTE — ED Provider Notes (Addendum)
CSN: [id number]     Arrival date & time 05/01/16  1258 History   First MD Initiated Contact with Patient 05/01/16 1309     Chief Complaint  Patient presents with  . Fever     (Consider location/radiation/quality/duration/timing/severity/associated sxs/prior Treatment) HPI Patient with fever which started 5 days ago maximum temperature 102.4, symptoms accompanied by productive cough. Patient denies shortness of breath denies vomiting. She's been seen by her primary care physician's office this past week prescribed azithromycin. She feels much improved over a few days ago. No vomiting. No other associated symptoms. Her daughter reports that "she hallucinates" when she has a fever. Her maximum temperature was 102.4 five days ago. Temperature was 100.3 this morning at 10 AM. She was treated with Tylenol at 10 AM. No other associated symptoms. She denies dysuria however her daughter reports that her urine had a strong smell this morning. Past Medical History  Diagnosis Date  . Hypertension   . Hyperlipidemia   . GERD (gastroesophageal reflux disease)   . COPD (chronic obstructive pulmonary disease) ([place])   . Osteoporosis   . Prediabetes   . Vitamin D deficiency    Past Surgical History  Procedure Laterality Date  . Tonsillectomy      childhood  . Vesicovaginal fistula closure w/ tah     Family History  Problem Relation Age of Onset  . Heart disease Mother   . Other Mother     VARICOSE VEINS  . Diabetes Father   . Other Brother     VARICOSE VEINS   Social History  Substance Use Topics  . Smoking status: Never Smoker   . Smokeless tobacco: Never Used  . Alcohol Use: No   OB History    No data available     Review of Systems  Constitutional: Positive for fever.  HENT: Negative.   Respiratory: Positive for cough. Negative for shortness of breath.   Cardiovascular: Negative.   Gastrointestinal: Negative.   Genitourinary: Negative for dysuria.  Musculoskeletal: Positive for  gait problem.       Walks with walker  Skin: Negative.   Neurological: Negative.   Psychiatric/Behavioral: Positive for hallucinations.  All other systems reviewed and are negative.     Allergies  Aspirin; Celebrex; Fosamax; Keflex; Penicillins; Prednisone; Prevacid; Sulfa antibiotics; Tetanus toxoids; and Tetracyclines & related  Home Medications   Prior to Admission medications   Medication Sig Start Date End Date Taking? Authorizing Provider  acetaminophen (TYLENOL) 325 MG tablet Take 2 tablets (650 mg total) by mouth every 6 (six) hours as needed (prn TEMP>38.1 Celsius). 12/21/14   Reyne Dumas, MD  azithromycin (ZITHROMAX Z-PAK) 250 MG tablet 2 po day one, then 1 daily x 4 days 04/27/16   Starlyn Skeans, PA-C  brompheniramine-pseudoephedrine-DM 30-2-10 MG/5ML syrup Take 5 mLs by mouth 3 (three) times daily as needed. 04/23/16   Courtney Forcucci, PA-C  busPIRone (BUSPAR) 10 MG tablet TAKE 1/2 - 1 TABLET 3 TIMES A DAY AS NEEDED FOR ANXIETY 04/03/16   Unk Pinto, MD  calcium-vitamin D (OSCAL WITH D) 250-125 MG-UNIT per tablet Take 1 tablet by mouth daily.    Historical Provider, MD  Cholecalciferol (VITAMIN D3) 2000 UNITS TABS Take 4 tablets by mouth daily.     Historical Provider, MD  citalopram (CELEXA) 20 MG tablet TAKE 1/2-1 TABLET BY MOUTH DAILY 02/09/16   Unk Pinto, MD  fexofenadine (ALLEGRA) 180 MG tablet Take 180 mg by mouth daily.     Historical Provider, MD  fluconazole (DIFLUCAN)  150 MG tablet Take 1 tablet weekly for vaginal yeast infection 03/15/15   Unk Pinto, MD  fluticasone [facility]) 50 MCG/ACT nasal spray Place 2 sprays into both nostrils daily. 04/23/16   Courtney Forcucci, PA-C  fluticasone furoate-vilanterol (BREO ELLIPTA) 100-25 MCG/INH AEPB Inhale 1 puff into the lungs daily. 04/27/16   Courtney Forcucci, PA-C  hydrochlorothiazide (HYDRODIURIL) 25 MG tablet TAKE 1 TABLET (25 MG TOTAL) BY MOUTH DAILY. 04/20/16   Unk Pinto, MD  levothyroxine  (SYNTHROID, LEVOTHROID) 50 MCG tablet TAKE 1 TABLET BY MOUTH DAILY 08/28/15   Unk Pinto, MD  magnesium oxide (MAG-OX) 400 (241.3 MG) MG tablet Take 1 tablet (400 mg total) by mouth 2 (two) times daily. 12/21/14   Reyne Dumas, MD  Multiple Vitamin (MULITIVITAMIN WITH MINERALS) TABS Take 1 tablet by mouth daily.    Historical Provider, MD  omeprazole (PRILOSEC) 40 MG capsule TAKE 1 CAPSULE DAILY 11/14/15   Courtney Forcucci, PA-C  Probiotic Product (PROBIOTIC FORMULA PO) Take 1 tablet by mouth daily.     Historical Provider, MD  terazosin (HYTRIN) 10 MG capsule TAKE 1 CAPSULE (10 MG TOTAL) BY MOUTH DAILY. 11/19/15   Unk Pinto, MD  traMADol (ULTRAM) 50 MG tablet TAKE 1 TABLET BY MOUTH 4 TIMES A DAY 10/14/15   Unk Pinto, MD   BP 149/78 mmHg  Pulse 77  Temp(Src) 98.8 F (37.1 C) (Oral)  Resp 20  Ht 5\' 3"  (1.6 m)  Wt 196 lb (88.905 kg)  BMI 34.73 kg/m2  SpO2 91% Physical Exam  Constitutional: She appears well-developed and well-nourished. No distress.  HENT:  Head: Normocephalic and atraumatic.  Eyes: Conjunctivae are normal. Pupils are equal, round, and reactive to light.  Neck: Neck supple. No tracheal deviation present. No thyromegaly present.  Cardiovascular: Normal rate and regular rhythm.   No murmur heard. Pulmonary/Chest: Effort normal and breath sounds normal.  Abdominal: Soft. Bowel sounds are normal. She exhibits no distension. There is no tenderness.  Musculoskeletal: Normal range of motion. She exhibits no edema or tenderness.  Neurological: She is alert. Coordination normal.  Skin: Skin is warm and dry. No rash noted.  Psychiatric: She has a normal mood and affect.  Nursing note and vitals reviewed.   ED Course  Procedures (including critical care time) Labs Review Labs Reviewed - No data to display  Imaging Review No results found. I have personally reviewed and evaluated these images and lab results as part of my medical decision-making.   EKG  Interpretation None     Chest x-ray viewed by me. 4 PM patient continues to feel well. Results for orders placed or performed during the hospital encounter of 05/01/16  CBC with Differential/Platelet  Result Value Ref Range   WBC 7.9 4.0 - 10.5 K/uL   RBC 3.93 3.87 - 5.11 MIL/uL   Hemoglobin 12.0 12.0 - 15.0 g/dL   HCT 35.0 (L) 36.0 - 46.0 %   MCV 89.1 78.0 - 100.0 fL   MCH 30.5 26.0 - 34.0 pg   MCHC 34.3 30.0 - 36.0 g/dL   RDW 13.5 11.5 - 15.5 %   Platelets 221 150 - 400 K/uL   Neutrophils Relative % 81 %   Lymphocytes Relative 14 %   Monocytes Relative 5 %   Eosinophils Relative 0 %   Basophils Relative 0 %   Neutro Abs 6.4 1.7 - 7.7 K/uL   Lymphs Abs 1.1 0.7 - 4.0 K/uL   Monocytes Absolute 0.4 0.1 - 1.0 K/uL   Eosinophils Absolute 0.0  0.0 - 0.7 K/uL   Basophils Absolute 0.0 0.0 - 0.1 K/uL   Smear Review MORPHOLOGY UNREMARKABLE   Basic metabolic panel  Result Value Ref Range   Sodium 133 (L) 135 - 145 mmol/L   Potassium 3.2 (L) 3.5 - 5.1 mmol/L   Chloride 95 (L) 101 - 111 mmol/L   CO2 29 22 - 32 mmol/L   Glucose, Bld 100 (H) 65 - 99 mg/dL   BUN 13 6 - 20 mg/dL   Creatinine, Ser 0.85 0.44 - 1.00 mg/dL   Calcium 9.0 8.9 - 10.3 mg/dL   GFR calc non Af Amer >60 >60 mL/min   GFR calc Af Amer >60 >60 mL/min   Anion gap 9 5 - 15  Urinalysis, Routine w reflex microscopic (not at [facility])  Result Value Ref Range   Color, Urine YELLOW YELLOW   APPearance CLEAR CLEAR   Specific Gravity, Urine 1.008 1.005 - 1.030   pH 7.0 5.0 - 8.0   Glucose, UA NEGATIVE NEGATIVE mg/dL   Hgb urine dipstick SMALL (A) NEGATIVE   Bilirubin Urine NEGATIVE NEGATIVE   Ketones, ur NEGATIVE NEGATIVE mg/dL   Protein, ur NEGATIVE NEGATIVE mg/dL   Nitrite NEGATIVE NEGATIVE   Leukocytes, UA TRACE (A) NEGATIVE  Urine microscopic-add on  Result Value Ref Range   Squamous Epithelial / LPF 0-5 (A) NONE SEEN   WBC, UA 0-5 0 - 5 WBC/hpf   RBC / HPF 0-5 0 - 5 RBC/hpf   Bacteria, UA RARE (A) NONE SEEN    Dg Chest 2 View  05/01/2016  CLINICAL DATA:  Cough and chest congestion for the past week. Ex-smoker. EXAM: CHEST  2 VIEW COMPARISON:  12/19/2014. FINDINGS: The cardiac silhouette remains borderline enlarged. A large hiatal hernia is again demonstrated. Interval patchy opacity in the left lower lung zone with a small left pleural effusion. Interval linear density at the right lung base. Diffuse osteopenia. IMPRESSION: 1. Left basilar pneumonia and small left pleural effusion. 2. Mild linear atelectasis at the right lung base. 3. Stable large hiatal hernia. Electronically Signed   By: Claudie Revering M.D.   On: 05/01/2016 14:47    MDM  I will feel the patient needs further treatment for pneumonia. She is still improved over several days. Plan follow-up with Dr.Mckeown's office if still coughing and still has fever by next week. Return if condition worsens. She'll receive oral potassium supplementation prior to discharge Diagnosis#1 community-acquired pneumonia  #2 hypokalemia Final diagnoses:  None        Orlie Dakin, MD 05/01/16 [place], MD 05/01/16 1614

## 2016-05-01 NOTE — ED Notes (Signed)
Pt with fever x 1-2 weeks-was seen by PCP last week-was given a shot-unsure what she was given-no dx-developed prod cough-was seen again by PCP 5 days ago-pt with cont'd fever-last dose tylenol 10am with fever 100.3-pt taken to tx area via w/c

## 2016-05-06 ENCOUNTER — Encounter: Payer: Self-pay | Admitting: Internal Medicine

## 2016-05-06 ENCOUNTER — Ambulatory Visit (INDEPENDENT_AMBULATORY_CARE_PROVIDER_SITE_OTHER): Payer: Medicare Other | Admitting: Internal Medicine

## 2016-05-06 VITALS — BP 124/76 | HR 96 | Temp 97.2°F | Resp 16 | Ht 61.0 in | Wt 176.4 lb

## 2016-05-06 DIAGNOSIS — J189 Pneumonia, unspecified organism: Secondary | ICD-10-CM

## 2016-05-06 DIAGNOSIS — E876 Hypokalemia: Secondary | ICD-10-CM | POA: Diagnosis not present

## 2016-05-06 DIAGNOSIS — Z79899 Other long term (current) drug therapy: Secondary | ICD-10-CM | POA: Diagnosis not present

## 2016-05-06 DIAGNOSIS — I10 Essential (primary) hypertension: Secondary | ICD-10-CM

## 2016-05-06 LAB — CBC WITH DIFFERENTIAL/PLATELET
Basophils Absolute: 64 cells/uL (ref 0–200)
Basophils Relative: 1 %
Eosinophils Absolute: 64 cells/uL (ref 15–500)
Eosinophils Relative: 1 %
HCT: 39.5 % (ref 35.0–45.0)
Hemoglobin: 13 g/dL (ref 11.7–15.5)
Lymphocytes Relative: 29 %
Lymphs Abs: 1856 cells/uL (ref 850–3900)
MCH: 30.2 pg (ref 27.0–33.0)
MCHC: 32.9 g/dL (ref 32.0–36.0)
MCV: 91.9 fL (ref 80.0–100.0)
MPV: 8.9 fL (ref 7.5–12.5)
Monocytes Absolute: 576 cells/uL (ref 200–950)
Monocytes Relative: 9 %
Neutro Abs: 3840 cells/uL (ref 1500–7800)
Neutrophils Relative %: 60 %
Platelets: 363 10*3/uL (ref 140–400)
RBC: 4.3 MIL/uL (ref 3.80–5.10)
RDW: 14 % (ref 11.0–15.0)
WBC: 6.4 10*3/uL (ref 3.8–10.8)

## 2016-05-06 NOTE — Progress Notes (Signed)
  Subjective:    Patient ID: Alyssa Schmitt, female    DOB: 1928/06/11, 82 y.o.   MRN: [id number]  HPI  This very nice 82 yo WWF with HTN, HLD, COPD, was recently treated about 10 days ago at the [place] [facility] for a lower respiratory illness and CXR showed LLL atelectasis and she was dx'd with possible PNA & treated with a Zpak which she completed about 5 days ago. Now she reports minimal sputum is clear & she denies and dyspnea.   Medication Sig  . acetaminophen  325 MG tablet Take 2 tablets (650 mg total) by mouth every 6 (six) hours as needed (prn TEMP>38.1 Celsius).  . busPIRone 10 MG tablet TAKE 1/2 - 1 TABLET 3 TIMES A DAY AS NEEDED FOR ANXIETY  . OSCAL w/ D 250-125  Take 1 tablet by mouth daily.  [name] Kitchen VITAMIN D  2000 UNITS TABS Take 4 tablets by mouth daily.   . citalopram  20 MG tablet TAKE 1/2-1 TABLET BY MOUTH DAILY  . fexofenadine  180 MG tablet Take 180 mg by mouth daily.   . fluconazole  150 MG tablet Take 1 tablet weekly for vaginal yeast infection  . FLONASE nasal spray Place 2 sprays into both nostrils daily.  . hydrochlorothiazide  25 MG tablet TAKE 1 TABLET (25 MG TOTAL) BY MOUTH DAILY.  [name] Kitchen levothyroxine  50 MCG tablet TAKE 1 TABLET BY MOUTH DAILY  . magnesium  400  MG tablet Take 1 tablet (400 mg total) by mouth 2 (two) times daily.  . MultiVit w/Min Take 1 tablet by mouth daily.  . Omeprazole 40 MG capsule TAKE 1 CAPSULE DAILY  . PROBIOTIC FORMULA  Take 1 tablet by mouth daily.   [name] Kitchen terazosin (HYTRIN) 10 MG capsule TAKE 1 CAPSULE (10 MG TOTAL) BY MOUTH DAILY.  . traMADol (ULTRAM) 50 MG tablet TAKE 1 TABLET BY MOUTH 4 TIMES A DAY  . BREO ELLIPTA 100-25 Inhale 1 puff into the lungs daily.   Past Medical History  Diagnosis Date  . Hypertension   . Hyperlipidemia   . GERD (gastroesophageal reflux disease)   . COPD (chronic obstructive pulmonary disease) ([place])   . Osteoporosis   . Prediabetes   . Vitamin D deficiency     Review of Systems 10 point systems review  negative except as above.    Objective:   Physical Exam  BP 124/76 mmHg  Pulse 96  Temp(Src) 97.2 F (36.2 C)  Resp 16  Ht 5\' 1"  (1.549 m)  Wt 176 lb 6.4 oz (80.015 kg)  BMI 33.35 kg/m2  HEENT - Eac's patent. TM's Nl. EOM's full. PERRLA. NasoOroPharynx clear. Neck - supple. Nl Thyroid. Carotids 2+ & No bruits, nodes, JVD Chest - Clear equal BS w/o Rales, rhonchi, wheezes. Cor - Nl HS. RRR w/o sig MGR. PP 1(+). No edema. Abd - No palpable organomegaly, masses or tenderness. BS nl. MS- FROM w/o deformities. Muscle power, tone and bulk Nl. Gait Nl. Neuro - No obvious Cr N abnormalities. Sensory, motor and Cerebellar functions appear Nl w/o focal abnormalities.    Assessment & Plan:   1. Essential hypertension   2. CAP (community acquired pneumonia)  - CBC with Differential/Platelet  3. Hypokalemia  - BASIC METABOLIC PANEL WITH GFR  4. Medication management  - CBC with Differential/Platelet - BASIC METABOLIC PANEL WITH GFR

## 2016-05-07 LAB — BASIC METABOLIC PANEL WITH GFR
BUN: 15 mg/dL (ref 7–25)
CO2: 26 mmol/L (ref 20–31)
Calcium: 9.9 mg/dL (ref 8.6–10.4)
Chloride: 96 mmol/L — ABNORMAL LOW (ref 98–110)
Creat: 1 mg/dL — ABNORMAL HIGH (ref 0.60–0.88)
GFR, Est African American: 59 mL/min — ABNORMAL LOW (ref >=60)
GFR, Est Non African American: 51 mL/min — ABNORMAL LOW (ref >=60)
Glucose, Bld: 93 mg/dL (ref 65–99)
Potassium: 4.5 mmol/L (ref 3.5–5.3)
Sodium: 136 mmol/L (ref 135–146)

## 2016-06-03 IMAGING — MR MR [PERSON_NAME] LOW W/O CM*L*
4 of 6 series · 19 of 40 positions shown · non-contrast
Comparison: None.

CLINICAL DATA: Left lower leg pain, swelling and erythema. History
of venous stasis ulcers. Evaluate for abscess. Initial encounter.

EXAM:
MRI OF LOWER LEFT EXTREMITY WITHOUT CONTRAST
TECHNIQUE: Multiplanar, multisequence MR imaging of the left lower leg was
performed. No intravenous contrast was administered.

[Series 5: T1 · coronal · 5.0mm · 0.80mm/px · 4 of 19 slices shown (1 of 3)]
[im 1/19]
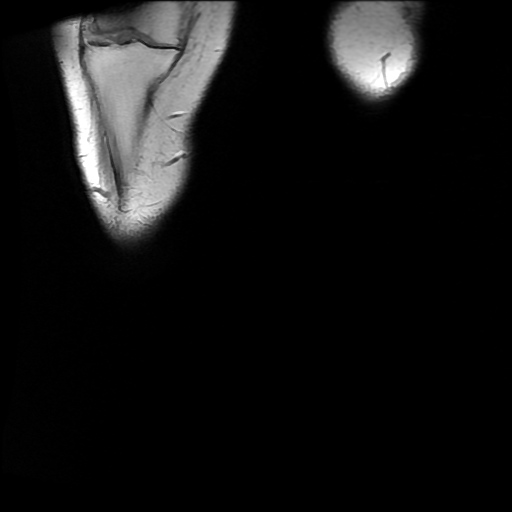
[im 7/19]
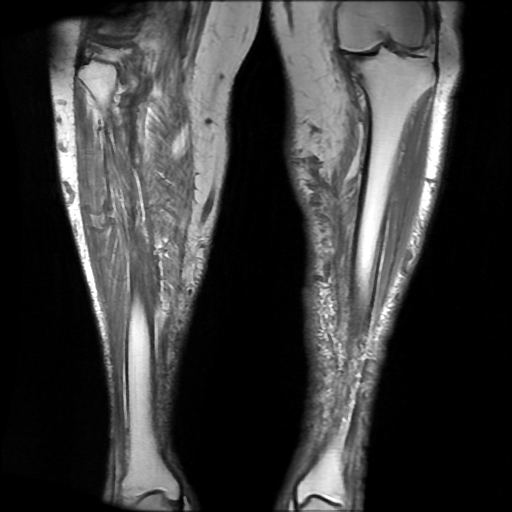
[im 13/19]
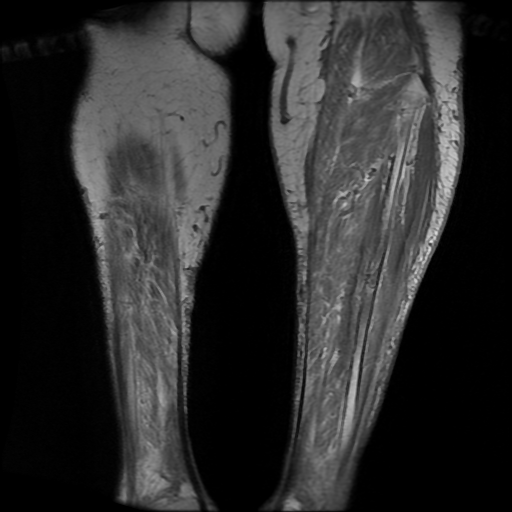
[im 19/19]
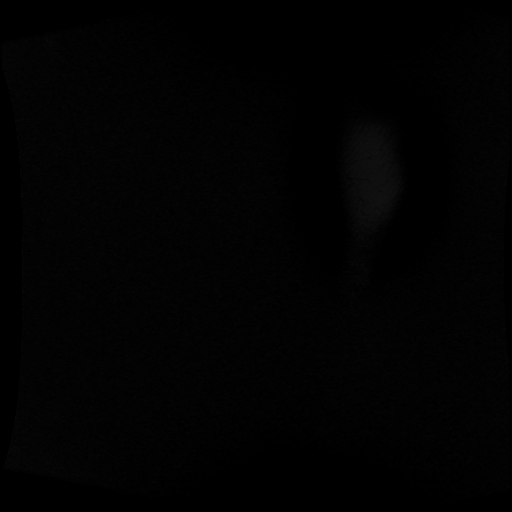

[Series 6: T1 · axial · 7.0mm · 0.68mm/px · z∈[-41,+290]mm · 3 of 45 slices shown (2 of 3)]
[im 5/45]
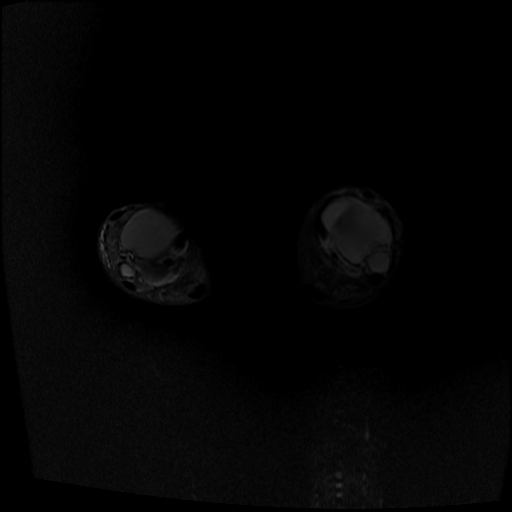
[im 25/45]
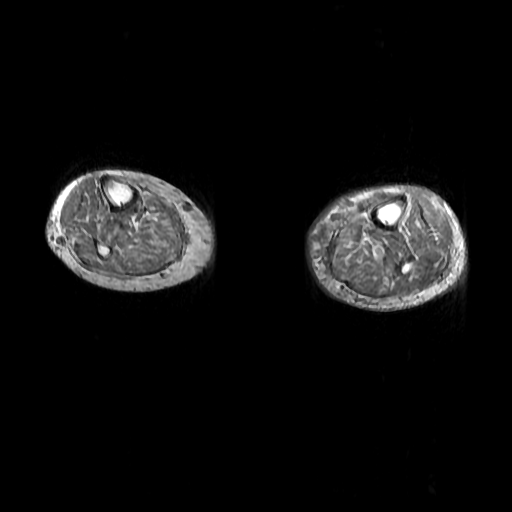
[im 40/45]
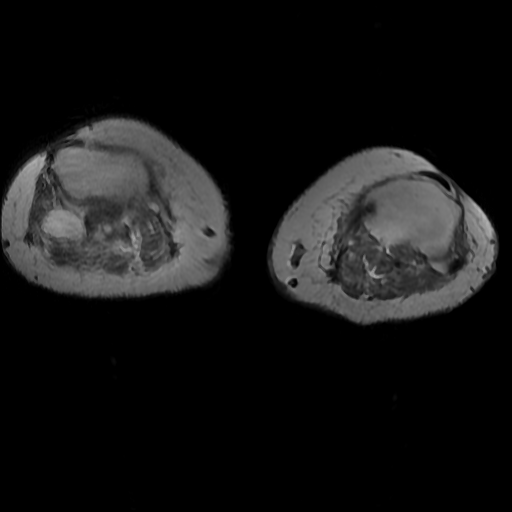

[Series 7: T1 · sagittal · 5.0mm · 0.78mm/px · 3 of 28 slices shown (3 of 3)]
[im 6/28]
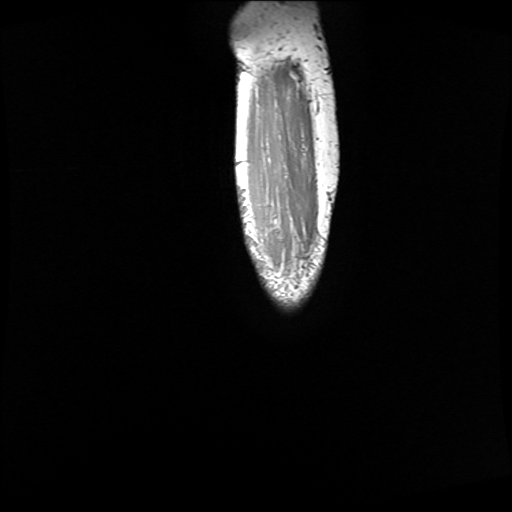
[im 17/28]
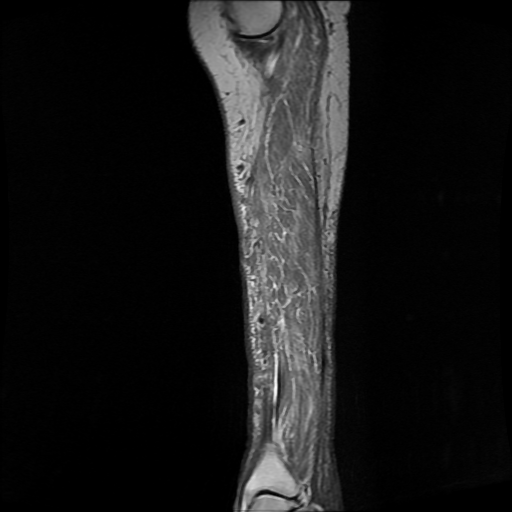
[im 28/28]
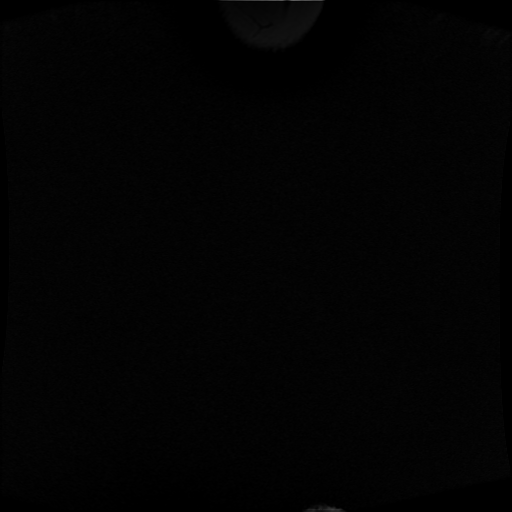

[Series 9: T2 fat-sat · axial · 7.0mm · 0.68mm/px · z∈[-50,+337]mm · 9 of 42 slices shown]
[im 1/42]
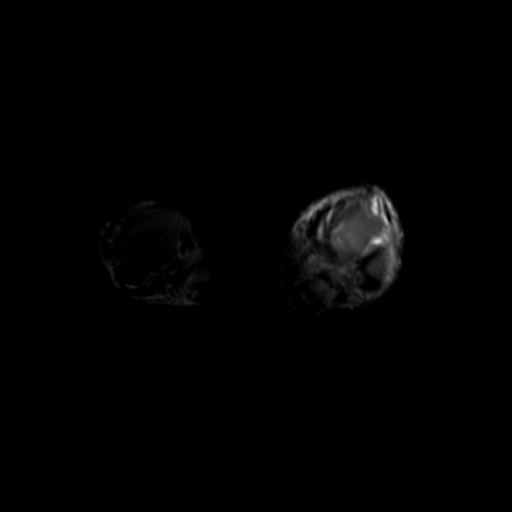
[im 6/42]
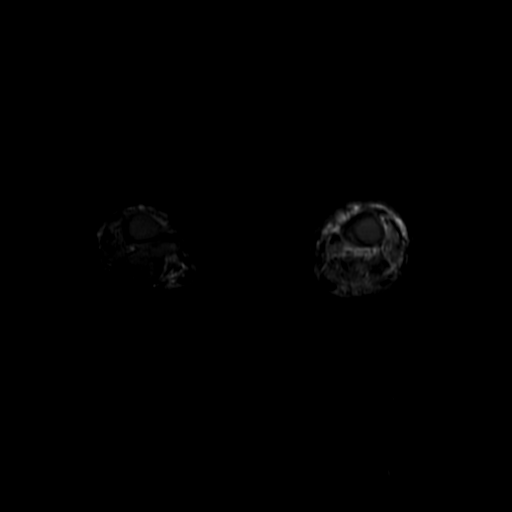
[im 11/42]
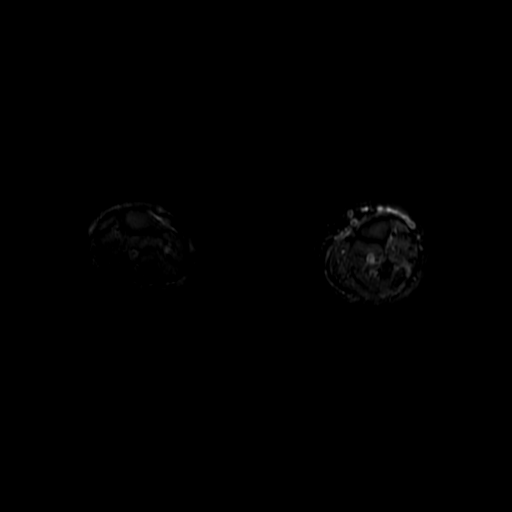
[im 16/42]
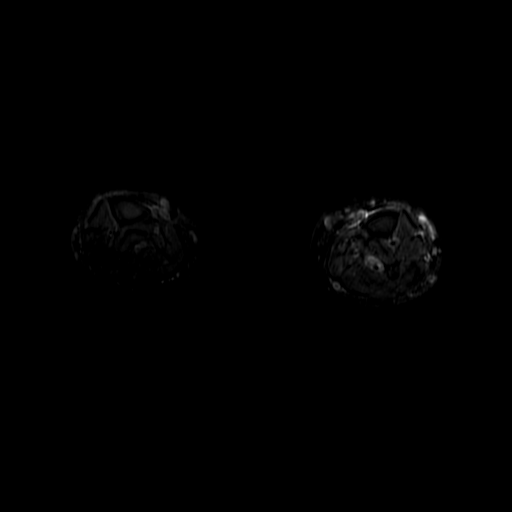
[im 21/42]
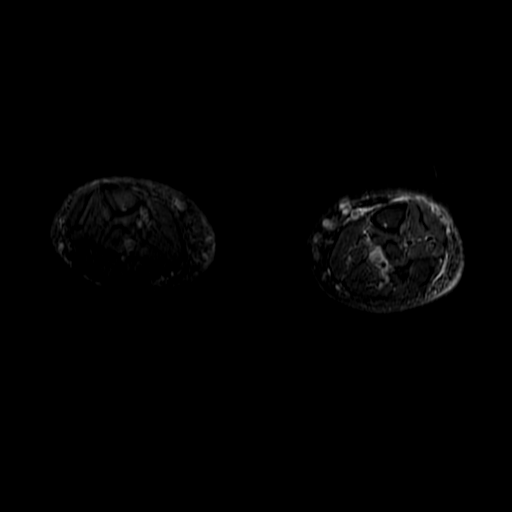
[im 26/42]
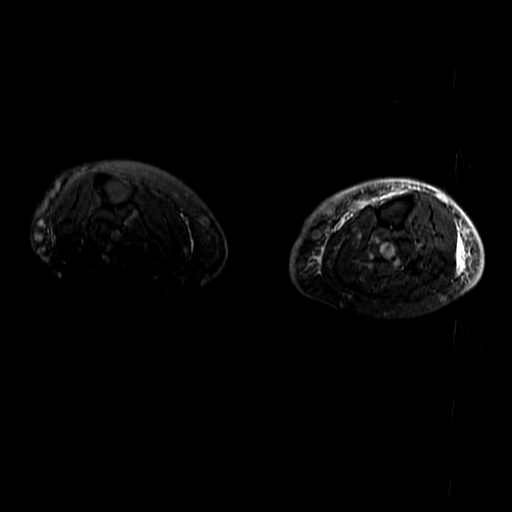
[im 31/42]
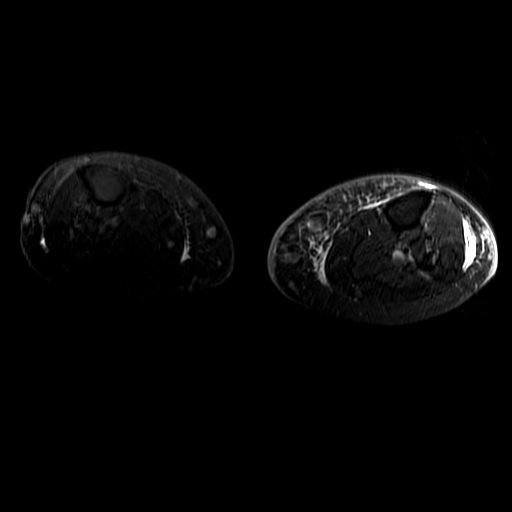
[im 36/42]
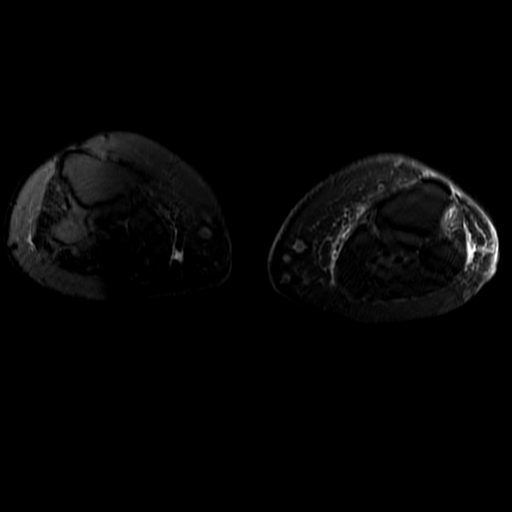
[im 42/42]
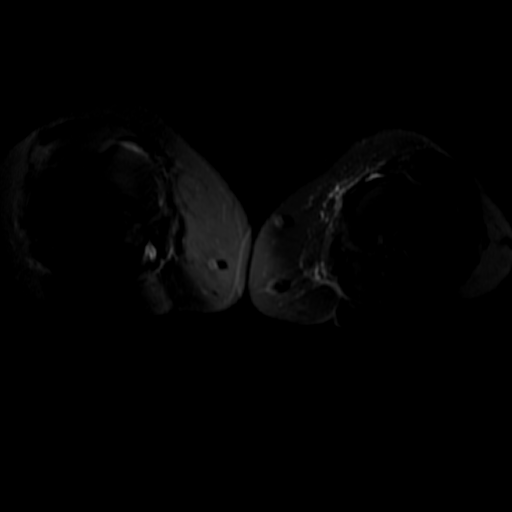

[19 of 40 positions shown; findings below may reference images not displayed]

FINDINGS: Both lower legs are included on the axial and coronal images.

There is asymmetric subcutaneous edema within the left lower leg
which appears fairly diffuse. No focal fluid collection
demonstrated. There is no abnormal signal within the lower leg
musculature. Small superficial varicosities are present within both
lower legs. There is nonspecific mildly increased signal within the
deep veins of the left lower leg, probably due to slow flow
(incompletely evaluated on this noncontrast study).

There is no evidence of osteomyelitis or acute osseous abnormality
in either lower leg.
IMPRESSION: 1. Asymmetric left lower leg subcutaneous edema may reflect
cellulitis or sequela of venous stasis. No evidence of soft tissue
abscess or myositis.
2. No evidence of osteomyelitis.

## 2016-06-11 ENCOUNTER — Other Ambulatory Visit: Payer: Self-pay | Admitting: *Deleted

## 2016-06-11 MED ORDER — TRAMADOL HCL 50 MG PO TABS
50.0000 mg | ORAL_TABLET | Freq: Four times a day (QID) | ORAL | Status: DC
Start: 2016-06-11 — End: 2016-12-30

## 2016-06-22 ENCOUNTER — Ambulatory Visit (INDEPENDENT_AMBULATORY_CARE_PROVIDER_SITE_OTHER): Payer: Medicare Other | Admitting: Physician Assistant

## 2016-06-22 VITALS — BP 138/70 | HR 104 | Temp 97.5°F | Resp 16 | Ht 61.0 in | Wt 176.0 lb

## 2016-06-22 DIAGNOSIS — E038 Other specified hypothyroidism: Secondary | ICD-10-CM

## 2016-06-22 DIAGNOSIS — E559 Vitamin D deficiency, unspecified: Secondary | ICD-10-CM

## 2016-06-22 DIAGNOSIS — I10 Essential (primary) hypertension: Secondary | ICD-10-CM | POA: Diagnosis not present

## 2016-06-22 DIAGNOSIS — E785 Hyperlipidemia, unspecified: Secondary | ICD-10-CM | POA: Diagnosis not present

## 2016-06-22 DIAGNOSIS — Z79899 Other long term (current) drug therapy: Secondary | ICD-10-CM | POA: Diagnosis not present

## 2016-06-22 DIAGNOSIS — R7303 Prediabetes: Secondary | ICD-10-CM | POA: Diagnosis not present

## 2016-06-22 DIAGNOSIS — F325 Major depressive disorder, single episode, in full remission: Secondary | ICD-10-CM

## 2016-06-22 DIAGNOSIS — J42 Unspecified chronic bronchitis: Secondary | ICD-10-CM | POA: Diagnosis not present

## 2016-06-22 LAB — CBC WITH DIFFERENTIAL/PLATELET
Basophils Absolute: 44 cells/uL (ref 0–200)
Basophils Relative: 1 %
Eosinophils Absolute: 44 cells/uL (ref 15–500)
Eosinophils Relative: 1 %
HCT: 39.3 % (ref 35.0–45.0)
Hemoglobin: 12.9 g/dL (ref 11.7–15.5)
Lymphocytes Relative: 42 %
Lymphs Abs: 1848 cells/uL (ref 850–3900)
MCH: 29.9 pg (ref 27.0–33.0)
MCHC: 32.8 g/dL (ref 32.0–36.0)
MCV: 91.2 fL (ref 80.0–100.0)
MPV: 10.9 fL (ref 7.5–12.5)
Monocytes Absolute: 396 cells/uL (ref 200–950)
Monocytes Relative: 9 %
Neutro Abs: 2068 cells/uL (ref 1500–7800)
Neutrophils Relative %: 47 %
Platelets: 186 10*3/uL (ref 140–400)
RBC: 4.31 MIL/uL (ref 3.80–5.10)
RDW: 13.7 % (ref 11.0–15.0)
WBC: 4.4 10*3/uL (ref 3.8–10.8)

## 2016-06-22 NOTE — Progress Notes (Signed)
Patient ID: Alyssa Schmitt, female   DOB: [birth year], 82 y.o.   MRN: [id number]  Assessment and Plan:  Essential hypertension -     CBC with Differential/Platelet -     BASIC METABOLIC PANEL WITH GFR -     Hepatic function panel - continue medications, DASH diet, exercise and monitor at home. Call if   greater than 130/80.   Chronic bronchitis, unspecified chronic bronchitis type ([place]) - continue meds.   Hypothyroidism -     TSH - Hypothyroidism-check TSH level, continue medications the same, reminded to take on an empty stomach 30-35mins before food.   Prediabetes  - monitor  Morbid obesity, unspecified obesity type ([place])  -Obesity with co morbidities- long discussion about weight loss, diet, and  exercise  Hyperlipidemia -     Lipid panel  Vitamin D deficiency  - continue supplement  Medication management -     Magnesium  Depression, major, in remission ([place])  - in remission, continue medicatoins  Continue diet and meds as discussed. Further disposition pending results of labs.  HPI 82 y.o. female  presents for 3 month follow up with hypertension, hyperlipidemia, prediabetes and vitamin D.   Her blood pressure has been controlled at home, today their BP is BP: 138/70 mmHg.   She does not workout. She denies chest pain, shortness of breath, dizziness.  She is limited due to her leg pain.  She has some knee pain today in her right knee, has taken tramadol that helps.    She is on cholesterol medication and denies myalgias. Her cholesterol is at goal. The cholesterol last visit was:   Lab Results  Component Value Date   CHOL 173 03/17/2016   HDL 75 03/17/2016   LDLCALC 82 03/17/2016   TRIG 78 03/17/2016   CHOLHDL 2.3 03/17/2016    She has been working on diet and exercise for prediabetes, and denies foot ulcerations, hyperglycemia, hypoglycemia , increased appetite, nausea, paresthesia of the feet, polydipsia, polyuria, visual disturbances, vomiting and weight loss.  Last A1C in the office was:  Lab Results  Component Value Date   HGBA1C 5.4 03/17/2016   Patient is on Vitamin D supplement.  Lab Results  Component Value Date   VD25OH 79 03/17/2016     BMI is Body mass index is 33.27 kg/(m^2)., she is working on diet and exercise. Wt Readings from Last 3 Encounters:  06/22/16 176 lb (79.833 kg)  05/06/16 176 lb 6.4 oz (80.015 kg)  05/01/16 196 lb (88.905 kg)   She is on thyroid medication. Her medication was not changed last visit.   Lab Results  Component Value Date   TSH 3.22 03/17/2016  .  Depression in remission with celexa/medications.    Current Medications:  Current Outpatient Prescriptions on File Prior to Visit  Medication Sig Dispense Refill  . acetaminophen (TYLENOL) 325 MG tablet Take 2 tablets (650 mg total) by mouth every 6 (six) hours as needed (prn TEMP>38.1 Celsius). 30 tablet 2  . busPIRone (BUSPAR) 10 MG tablet TAKE 1/2 - 1 TABLET 3 TIMES A DAY AS NEEDED FOR ANXIETY 270 tablet 1  . calcium-vitamin D (OSCAL WITH D) 250-125 MG-UNIT per tablet Take 1 tablet by mouth daily.    . Cholecalciferol (VITAMIN D3) 2000 UNITS TABS Take 4 tablets by mouth daily.     . citalopram (CELEXA) 20 MG tablet TAKE 1/2-1 TABLET BY MOUTH DAILY 90 tablet 1  . fexofenadine (ALLEGRA) 180 MG tablet Take 180 mg by mouth daily.     [name] Kitchen  fluconazole (DIFLUCAN) 150 MG tablet Take 1 tablet weekly for vaginal yeast infection 3 tablet 3  . fluticasone (FLONASE) 50 MCG/ACT nasal spray Place 2 sprays into both nostrils daily. 16 g 0  . hydrochlorothiazide (HYDRODIURIL) 25 MG tablet TAKE 1 TABLET (25 MG TOTAL) BY MOUTH DAILY. 30 tablet 3  . levothyroxine (SYNTHROID, LEVOTHROID) 50 MCG tablet TAKE 1 TABLET BY MOUTH DAILY 90 tablet 3  . magnesium oxide (MAG-OX) 400 (241.3 MG) MG tablet Take 1 tablet (400 mg total) by mouth 2 (two) times daily. 60 tablet 2  . Multiple Vitamin (MULITIVITAMIN WITH MINERALS) TABS Take 1 tablet by mouth daily.    [name] Kitchen omeprazole (PRILOSEC)  40 MG capsule TAKE 1 CAPSULE DAILY 90 capsule 4  . Probiotic Product (PROBIOTIC FORMULA PO) Take 1 tablet by mouth daily.     [name] Kitchen terazosin (HYTRIN) 10 MG capsule TAKE 1 CAPSULE (10 MG TOTAL) BY MOUTH DAILY. 90 capsule 3  . traMADol (ULTRAM) 50 MG tablet Take 1 tablet (50 mg total) by mouth 4 (four) times daily. 100 tablet 2   No current facility-administered medications on file prior to visit.    Medical History:  Past Medical History  Diagnosis Date  . Hypertension   . Hyperlipidemia   . GERD (gastroesophageal reflux disease)   . COPD (chronic obstructive pulmonary disease) ([place])   . Osteoporosis   . Prediabetes   . Vitamin D deficiency     Allergies:  Allergies  Allergen Reactions  . Aspirin Swelling  . Celebrex [Celecoxib] Other (See Comments)    "my body lit up"  . Fosamax [Alendronate Sodium] Other (See Comments)    unknown  . Keflex [Cephalexin] Other (See Comments)    unknown  . Penicillins Other (See Comments)    unknown  . Prednisone Itching  . Prevacid [Lansoprazole] Other (See Comments)    unknown  . Sulfa Antibiotics Other (See Comments)    unknown  . Tetanus Toxoids Other (See Comments)    unknown  . Tetracyclines & Related Nausea Only    Nausea      Review of Systems:  Review of Systems  Constitutional: Negative for fever, chills and malaise/fatigue.  HENT: Negative for congestion, ear pain and sore throat.   Respiratory: Negative for cough, shortness of breath and wheezing.   Cardiovascular: Negative for chest pain, palpitations and leg swelling.  Gastrointestinal: Negative for heartburn, diarrhea, constipation, blood in stool and melena.  Genitourinary: Negative.   Musculoskeletal: Positive for joint pain (right knee).  Skin: Negative.   Neurological: Negative for dizziness, sensory change, loss of consciousness and headaches.  Psychiatric/Behavioral: Negative for depression. The patient is not nervous/anxious and does not have insomnia.      Family history- Review and unchanged  Social history- Review and unchanged  Physical Exam: BP 138/70 mmHg  Pulse 104  Temp(Src) 97.5 F (36.4 C) (Temporal)  Resp 16  Ht 5\' 1"  (1.549 m)  Wt 176 lb (79.833 kg)  BMI 33.27 kg/m2  SpO2 95% Wt Readings from Last 3 Encounters:  06/22/16 176 lb (79.833 kg)  05/06/16 176 lb 6.4 oz (80.015 kg)  05/01/16 196 lb (88.905 kg)   General Appearance: Well nourished, in no apparent distress.  Eyes: PERRLA, EOMs, conjunctiva no swelling or erythema, normal fundi and vessels.  ENT/Mouth: Ext aud canals clear, with TMs without erythema, bulging.  Tonsils not swollen or erythematous. Hearing normal.  Neck: Supple, thyroid normal.  Respiratory: Respiratory effort normal, BS equal bilaterally without rales, rhonci, wheezing or stridor.  Cardio: Heart sounds normal, regular rate and rhythm with 4/6 systolic murmur but no rubs or gallops. Peripheral pulses brisk and equal bilaterally, without edema, compression stockings on.  Abdomen: Flat, soft, with bowel sounds. Nontender, no guarding, rebound, hernias, masses, or organomegaly.  Lymphatics: Non tender without lymphadenopathy.  Musculoskeletal: Full ROM all peripheral extremities, joint stability, 4/5 strength, and gait with walker Skin: Warm, dry without rashes, lesions, ecchymosis.  Neuro: Cranial nerves intact, reflexes equal bilaterally. Normal muscle tone, no cerebellar symptoms.  Pysch: Awake and oriented X 3, normal affect, Insight and Judgment appropriate.    Vicie Mutters, PA-C 2:47 PM [facility]

## 2016-06-23 LAB — LIPID PANEL
Cholesterol: 167 mg/dL (ref 125–200)
HDL: 64 mg/dL (ref >=46)
LDL Cholesterol: 84 mg/dL (ref <130)
Total CHOL/HDL Ratio: 2.6 Ratio (ref <=5.0)
Triglycerides: 93 mg/dL (ref <150)
VLDL: 19 mg/dL (ref <30)

## 2016-06-23 LAB — HEPATIC FUNCTION PANEL
ALT: 25 U/L (ref 6–29)
AST: 35 U/L (ref 10–35)
Albumin: 4.2 g/dL (ref 3.6–5.1)
Alkaline Phosphatase: 47 U/L (ref 33–130)
Bilirubin, Direct: 0.2 mg/dL (ref <=0.2)
Indirect Bilirubin: 0.5 mg/dL (ref 0.2–1.2)
Total Bilirubin: 0.7 mg/dL (ref 0.2–1.2)
Total Protein: 7.7 g/dL (ref 6.1–8.1)

## 2016-06-23 LAB — BASIC METABOLIC PANEL WITH GFR
BUN: 22 mg/dL (ref 7–25)
CO2: 26 mmol/L (ref 20–31)
Calcium: 9.6 mg/dL (ref 8.6–10.4)
Chloride: 98 mmol/L (ref 98–110)
Creat: 1.06 mg/dL — ABNORMAL HIGH (ref 0.60–0.88)
GFR, Est African American: 55 mL/min — ABNORMAL LOW (ref >=60)
GFR, Est Non African American: 48 mL/min — ABNORMAL LOW (ref >=60)
Glucose, Bld: 83 mg/dL (ref 65–99)
Potassium: 3.9 mmol/L (ref 3.5–5.3)
Sodium: 138 mmol/L (ref 135–146)

## 2016-06-23 LAB — TSH: TSH: 2.77 mIU/L

## 2016-06-23 LAB — MAGNESIUM: Magnesium: 1.8 mg/dL (ref 1.5–2.5)

## 2016-07-06 DIAGNOSIS — L603 Nail dystrophy: Secondary | ICD-10-CM | POA: Diagnosis not present

## 2016-07-06 DIAGNOSIS — I739 Peripheral vascular disease, unspecified: Secondary | ICD-10-CM | POA: Diagnosis not present

## 2016-07-10 DIAGNOSIS — Z1231 Encounter for screening mammogram for malignant neoplasm of breast: Secondary | ICD-10-CM | POA: Diagnosis not present

## 2016-07-21 DIAGNOSIS — H40013 Open angle with borderline findings, low risk, bilateral: Secondary | ICD-10-CM | POA: Diagnosis not present

## 2016-07-21 DIAGNOSIS — H40033 Anatomical narrow angle, bilateral: Secondary | ICD-10-CM | POA: Diagnosis not present

## 2016-07-21 DIAGNOSIS — H2513 Age-related nuclear cataract, bilateral: Secondary | ICD-10-CM | POA: Diagnosis not present

## 2016-07-21 DIAGNOSIS — H25013 Cortical age-related cataract, bilateral: Secondary | ICD-10-CM | POA: Diagnosis not present

## 2016-08-10 ENCOUNTER — Encounter: Payer: Self-pay | Admitting: *Deleted

## 2016-08-11 ENCOUNTER — Other Ambulatory Visit: Payer: Self-pay | Admitting: Internal Medicine

## 2016-08-24 ENCOUNTER — Other Ambulatory Visit: Payer: Self-pay | Admitting: Internal Medicine

## 2016-08-25 ENCOUNTER — Ambulatory Visit (INDEPENDENT_AMBULATORY_CARE_PROVIDER_SITE_OTHER): Payer: Medicare Other | Admitting: *Deleted

## 2016-08-25 DIAGNOSIS — Z23 Encounter for immunization: Secondary | ICD-10-CM | POA: Diagnosis not present

## 2016-09-07 ENCOUNTER — Encounter: Payer: Self-pay | Admitting: Internal Medicine

## 2016-10-05 DIAGNOSIS — I739 Peripheral vascular disease, unspecified: Secondary | ICD-10-CM | POA: Diagnosis not present

## 2016-10-05 DIAGNOSIS — L603 Nail dystrophy: Secondary | ICD-10-CM | POA: Diagnosis not present

## 2016-10-06 ENCOUNTER — Encounter: Payer: Self-pay | Admitting: Internal Medicine

## 2016-10-06 ENCOUNTER — Ambulatory Visit (INDEPENDENT_AMBULATORY_CARE_PROVIDER_SITE_OTHER): Payer: Medicare Other | Admitting: Internal Medicine

## 2016-10-06 VITALS — BP 128/84 | HR 84 | Temp 97.5°F | Resp 16 | Ht 59.5 in | Wt 179.8 lb

## 2016-10-06 DIAGNOSIS — R7303 Prediabetes: Secondary | ICD-10-CM

## 2016-10-06 DIAGNOSIS — Z0000 Encounter for general adult medical examination without abnormal findings: Secondary | ICD-10-CM

## 2016-10-06 DIAGNOSIS — I10 Essential (primary) hypertension: Secondary | ICD-10-CM | POA: Diagnosis not present

## 2016-10-06 DIAGNOSIS — Z1212 Encounter for screening for malignant neoplasm of rectum: Secondary | ICD-10-CM

## 2016-10-06 DIAGNOSIS — E782 Mixed hyperlipidemia: Secondary | ICD-10-CM | POA: Diagnosis not present

## 2016-10-06 DIAGNOSIS — Z136 Encounter for screening for cardiovascular disorders: Secondary | ICD-10-CM

## 2016-10-06 DIAGNOSIS — E559 Vitamin D deficiency, unspecified: Secondary | ICD-10-CM

## 2016-10-06 DIAGNOSIS — Z0001 Encounter for general adult medical examination with abnormal findings: Secondary | ICD-10-CM

## 2016-10-06 DIAGNOSIS — E038 Other specified hypothyroidism: Secondary | ICD-10-CM | POA: Diagnosis not present

## 2016-10-06 DIAGNOSIS — K219 Gastro-esophageal reflux disease without esophagitis: Secondary | ICD-10-CM

## 2016-10-06 DIAGNOSIS — Z79899 Other long term (current) drug therapy: Secondary | ICD-10-CM

## 2016-10-06 LAB — CBC WITH DIFFERENTIAL/PLATELET
Basophils Absolute: 60 cells/uL (ref 0–200)
Basophils Relative: 1 %
Eosinophils Absolute: 60 cells/uL (ref 15–500)
Eosinophils Relative: 1 %
HCT: 39.7 % (ref 35.0–45.0)
Hemoglobin: 13.2 g/dL (ref 11.7–15.5)
Lymphocytes Relative: 38 %
Lymphs Abs: 2280 cells/uL (ref 850–3900)
MCH: 29.9 pg (ref 27.0–33.0)
MCHC: 33.2 g/dL (ref 32.0–36.0)
MCV: 90 fL (ref 80.0–100.0)
MPV: 10.5 fL (ref 7.5–12.5)
Monocytes Absolute: 480 cells/uL (ref 200–950)
Monocytes Relative: 8 %
Neutro Abs: 3120 cells/uL (ref 1500–7800)
Neutrophils Relative %: 52 %
Platelets: 180 10*3/uL (ref 140–400)
RBC: 4.41 MIL/uL (ref 3.80–5.10)
RDW: 13.8 % (ref 11.0–15.0)
WBC: 6 10*3/uL (ref 3.8–10.8)

## 2016-10-06 LAB — TSH: TSH: 3.27 mIU/L

## 2016-10-06 NOTE — Progress Notes (Signed)
[place] [facility] Unk Pinto, M.D.    [name], P.A.-C      Starlyn Skeans, P.A.-C  [facility]                [address] [place], [place] [id number] Telephone [phone] Telefax [phone]  Annual Screening/Preventative Visit & Comprehensive Evaluation &  Examination     This very nice 82 y.o. [facility] presents for a Screening/Preventative Visit & comprehensive evaluation and management of multiple medical co-morbidities.  Patient has been followed for HTN, T2_NIDDM  Prediabetes, Hyperlipidemia and Vitamin D Deficiency. Patient has GERD controlled on her Omeprazole & diet.       HTN predates circa 1990. Patient's BP has been controlled at home and patient denies any cardiac symptoms as chest pain, palpitations, shortness of breath, dizziness or ankle swelling. Today's BP is 128/84      Patient's hyperlipidemia is controlled with diet and medications. Patient denies myalgias or other medication SE's. Last lipids were at goal: Lab Results  Component Value Date   CHOL 167 06/22/2016   HDL 64 06/22/2016   LDLCALC 84 06/22/2016   TRIG 93 06/22/2016   CHOLHDL 2.6 06/22/2016      Patient has Morbid Obesity (BMI 35+) / prediabetes predating since 2014 with A1c 5.7% and patient denies reactive hypoglycemic symptoms, visual blurring, diabetic polys, or paresthesias. Last A1c was at goal: Lab Results  Component Value Date   HGBA1C 5.4 03/17/2016      Finally, patient has history of Vitamin D Deficiency and last Vitamin D was at goal: Lab Results  Component Value Date   VD25OH 79 03/17/2016   Current Outpatient Prescriptions on File Prior to Visit  Medication Sig  . acetaminophen (TYLENOL) 325 MG tablet Take 2 tablets (650 mg total) by mouth every 6 (six) hours as needed (prn TEMP>38.1 Celsius).  . busPIRone (BUSPAR) 10 MG tablet TAKE 1/2 - 1 TABLET 3 TIMES A DAY AS NEEDED FOR ANXIETY  .  calcium-vitamin D (OSCAL WITH D) 250-125 MG-UNIT per tablet Take 1 tablet by mouth daily.  . Cholecalciferol (VITAMIN D3) 2000 UNITS TABS Take 4 tablets by mouth daily.   . citalopram (CELEXA) 20 MG tablet TAKE 1/2-1 TABLET BY MOUTH DAILY  . fexofenadine (ALLEGRA) 180 MG tablet Take 180 mg by mouth daily.   . fluticasone (FLONASE) 50 MCG/ACT nasal spray Place 2 sprays into both nostrils daily.  . hydrochlorothiazide (HYDRODIURIL) 25 MG tablet TAKE 1 TABLET (25 MG TOTAL) BY MOUTH DAILY.  [name] Kitchen levothyroxine (SYNTHROID, LEVOTHROID) 50 MCG tablet TAKE 1 TABLET BY MOUTH DAILY  . magnesium oxide (MAG-OX) 400 (241.3 MG) MG tablet Take 1 tablet (400 mg total) by mouth 2 (two) times daily.  . Multiple Vitamin (MULITIVITAMIN WITH MINERALS) TABS Take 1 tablet by mouth daily.  [name] Kitchen omeprazole (PRILOSEC) 40 MG capsule TAKE 1 CAPSULE DAILY  . Probiotic Product (PROBIOTIC FORMULA PO) Take 1 tablet by mouth daily.   [name] Kitchen terazosin (HYTRIN) 10 MG capsule TAKE 1 CAPSULE (10 MG TOTAL) BY MOUTH DAILY.  . traMADol (ULTRAM) 50 MG tablet Take 1 tablet (50 mg total) by mouth 4 (four) times daily.   No current facility-administered medications on file prior to visit.    Allergies  Allergen Reactions  . Aspirin Swelling  . Celebrex [Celecoxib] Other (See Comments)    "my body lit up"  . Fosamax [Alendronate Sodium]  Other (See Comments)    unknown  . Keflex [Cephalexin] Other (See Comments)    unknown  . Penicillins Other (See Comments)    unknown  . Prednisone Itching  . Prevacid [Lansoprazole] Other (See Comments)    unknown  . Sulfa Antibiotics Other (See Comments)    unknown  . Tetanus Toxoids Other (See Comments)    unknown  . Tetracyclines & Related Nausea Only    Nausea    Past Medical History:  Diagnosis Date  . COPD (chronic obstructive pulmonary disease) ([place])   . GERD (gastroesophageal reflux disease)   . Hyperlipidemia   . Hypertension   . Osteoporosis   . Prediabetes   . Vitamin D deficiency     Health Maintenance  Topic Date Due  . INFLUENZA VACCINE  Completed  . DEXA SCAN  Completed  . ZOSTAVAX  Completed  . PNA vac Low Risk Adult  Completed   Immunization History  Administered Date(s) Administered  . Influenza, High Dose Seasonal PF 09/05/2014, 09/17/2015, 08/25/2016  . Pneumococcal Conjugate-13 11/20/2015  . Pneumococcal Polysaccharide-23 12/18/2007  . Zoster 07/25/2013   Past Surgical History:  Procedure Laterality Date  . TONSILLECTOMY     childhood  . VESICOVAGINAL FISTULA CLOSURE W/ TAH     Family History  Problem Relation Age of Onset  . Heart disease Mother   . Other Mother     VARICOSE VEINS  . Diabetes Father   . Other Brother     VARICOSE VEINS   Social History  Substance Use Topics  . Smoking status: Never Smoker  . Smokeless tobacco: Never Used  . Alcohol use No    ROS Constitutional: Denies fever, chills, weight loss/gain, headaches, insomnia,  night sweats, and change in appetite. Does c/o fatigue. Eyes: Denies redness, blurred vision, diplopia, discharge, itchy, watery eyes.  ENT: Denies discharge, congestion, post nasal drip, epistaxis, sore throat, earache, hearing loss, dental pain, Tinnitus, Vertigo, Sinus pain, snoring.  Cardio: Denies chest pain, palpitations, irregular heartbeat, syncope, dyspnea, diaphoresis, orthopnea, PND, claudication, edema Respiratory: denies cough, dyspnea, DOE, pleurisy, hoarseness, laryngitis, wheezing.  Gastrointestinal: Denies dysphagia, heartburn, reflux, water brash, pain, cramps, nausea, vomiting, bloating, diarrhea, constipation, hematemesis, melena, hematochezia, jaundice, hemorrhoids Genitourinary: Denies dysuria, frequency, urgency, nocturia, hesitancy, discharge, hematuria, flank pain Breast: Breast lumps, nipple discharge, bleeding.  Musculoskeletal: Denies arthralgia, myalgia, stiffness, Jt. Swelling, pain, limp, and strain/sprain. Denies falls. Skin: Denies puritis, rash, hives, warts, acne,  eczema, changing in skin lesion Neuro: No weakness, tremor, incoordination, spasms, paresthesia, pain Psychiatric: Denies confusion, memory loss, sensory loss. Denies Depression. Endocrine: Denies change in weight, skin, hair change, nocturia, and paresthesia, diabetic polys, visual blurring, hyper / hypo glycemic episodes.  Heme/Lymph: No excessive bleeding, bruising, enlarged lymph nodes.  Physical Exam  BP 128/84   Pulse 84   Temp 97.5 F (36.4 C)   Resp 16   Ht 4' 11.5" (1.511 m)   Wt 179 lb 12.8 oz (81.6 kg)   BMI 35.71 kg/m   General Appearance: Well nourished and in no apparent distress.  Eyes: PERRLA, EOMs, conjunctiva no swelling or erythema, normal fundi and vessels. Sinuses: No frontal/maxillary tenderness ENT/Mouth: EACs patent / TMs  nl. Nares clear without erythema, swelling, mucoid exudates. Oral hygiene is good. No erythema, swelling, or exudate. Tongue normal, non-obstructing. Tonsils not swollen or erythematous. Hearing normal.  Neck: Supple, thyroid normal. No bruits, nodes or JVD. Respiratory: Respiratory effort normal.  BS equal and clear bilateral without rales, rhonci, wheezing or stridor. Cardio: Heart  sounds are normal with regular rate and rhythm and no murmurs, rubs or gallops. Peripheral pulses are normal and equal bilaterally without edema. No aortic or femoral bruits. Chest: moderate gibbous deformity. Breasts: Symmetric, without lumps, nipple discharge, retractions, or fibrocystic changes.  Abdomen: Flat, soft with bowel sounds active. Nontender, no guarding, rebound, hernias, masses, or organomegaly.  Lymphatics: Non tender without lymphadenopathy.  Musculoskeletal: Full ROM all peripheral extremities, joint stability, 5/5 strength, and normal gait. Skin: Warm and dry without rashes, lesions, cyanosis, clubbing or  ecchymosis.  Neuro: Cranial nerves intact, reflexes equal bilaterally. Normal muscle tone, no cerebellar symptoms. Sensation intact.   Pysch: Alert and oriented X 3, normal affect, Insight and Judgment appropriate.   Assessment and Plan  1. Annual Preventative Screening Examination  - Microalbumin / creatinine urine ratio - EKG 12-Lead - POC Hemoccult Bld/Stl  - Urinalysis, Routine w reflex microscopic  - CBC with Differential/Platelet - BASIC METABOLIC PANEL WITH GFR - Hepatic function panel - Magnesium - Lipid panel - TSH - Hemoglobin A1c - Insulin, random - VITAMIN D 25 Hydroxy   2. Essential hypertension  - Microalbumin / creatinine urine ratio - EKG 12-Lead - TSH  3. Mixed hyperlipidemia  - EKG 12-Lead - Lipid panel - TSH  4. Prediabetes  - EKG 12-Lead - Hemoglobin A1c - Insulin, random  5. Vitamin D deficiency  - VITAMIN D 25 Hydroxy   6. Hypothyroidism  - TSH  7. Gastroesophageal reflux disease   8. Screening for rectal cancer  - POC Hemoccult Bld/Stl  9. Screening for ischemic heart disease  - EKG 12-Lead  10. Medication management  - Urinalysis, Routine w reflex microscopic  - CBC with Differential/Platelet - BASIC METABOLIC PANEL WITH GFR - Hepatic function panel - Magnesium      Continue prudent diet as discussed, weight control, BP monitoring, regular exercise, and medications. Discussed med's effects and SE's. Screening labs and tests as requested with regular follow-up as recommended. Over 40 minutes of exam, counseling, chart review and high complex critical decision making was performed.

## 2016-10-06 NOTE — Patient Instructions (Signed)

## 2016-10-07 LAB — URINALYSIS, ROUTINE W REFLEX MICROSCOPIC
Bilirubin Urine: NEGATIVE
Glucose, UA: NEGATIVE
Hgb urine dipstick: NEGATIVE
Nitrite: NEGATIVE
Protein, ur: NEGATIVE
Specific Gravity, Urine: 1.021 (ref 1.001–1.035)
pH: 7 (ref 5.0–8.0)

## 2016-10-07 LAB — BASIC METABOLIC PANEL WITH GFR
BUN: 22 mg/dL (ref 7–25)
CO2: 24 mmol/L (ref 20–31)
Calcium: 10.1 mg/dL (ref 8.6–10.4)
Chloride: 99 mmol/L (ref 98–110)
Creat: 1.12 mg/dL — ABNORMAL HIGH (ref 0.60–0.88)
GFR, Est African American: 52 mL/min — ABNORMAL LOW (ref >=60)
GFR, Est Non African American: 45 mL/min — ABNORMAL LOW (ref >=60)
Glucose, Bld: 93 mg/dL (ref 65–99)
Potassium: 3.8 mmol/L (ref 3.5–5.3)
Sodium: 139 mmol/L (ref 135–146)

## 2016-10-07 LAB — URINALYSIS, MICROSCOPIC ONLY
Casts: NONE SEEN [LPF]
Yeast: NONE SEEN [HPF]

## 2016-10-07 LAB — HEPATIC FUNCTION PANEL
ALT: 26 U/L (ref 6–29)
AST: 40 U/L — ABNORMAL HIGH (ref 10–35)
Albumin: 4.6 g/dL (ref 3.6–5.1)
Alkaline Phosphatase: 53 U/L (ref 33–130)
Bilirubin, Direct: 0.2 mg/dL (ref <=0.2)
Indirect Bilirubin: 0.6 mg/dL (ref 0.2–1.2)
Total Bilirubin: 0.8 mg/dL (ref 0.2–1.2)
Total Protein: 7.9 g/dL (ref 6.1–8.1)

## 2016-10-07 LAB — LIPID PANEL
Cholesterol: 172 mg/dL (ref 125–200)
HDL: 61 mg/dL (ref >=46)
LDL Cholesterol: 90 mg/dL (ref <130)
Total CHOL/HDL Ratio: 2.8 Ratio (ref <=5.0)
Triglycerides: 107 mg/dL (ref <150)
VLDL: 21 mg/dL (ref <30)

## 2016-10-07 LAB — MICROALBUMIN / CREATININE URINE RATIO
Creatinine, Urine: 224 mg/dL (ref 20–320)
Microalb Creat Ratio: 11 mcg/mg creat (ref <30)
Microalb, Ur: 2.5 mg/dL

## 2016-10-07 LAB — INSULIN, RANDOM: Insulin: 6.6 u[IU]/mL (ref 2.0–19.6)

## 2016-10-07 LAB — VITAMIN D 25 HYDROXY (VIT D DEFICIENCY, FRACTURES): Vit D, 25-Hydroxy: 86 ng/mL (ref 30–100)

## 2016-10-07 LAB — HEMOGLOBIN A1C
Hgb A1c MFr Bld: 5.3 % (ref <5.7)
Mean Plasma Glucose: 105 mg/dL

## 2016-10-07 LAB — MAGNESIUM: Magnesium: 1.9 mg/dL (ref 1.5–2.5)

## 2016-10-30 NOTE — Progress Notes (Signed)
[place] [facility]   Unk Pinto, M.D.    [name], P.A.-C      Starlyn Skeans, P.A.-C  [facility]                [address] [place], [place] [id number] Telephone [phone] Telefax [phone] Subjective:    Patient ID: Alyssa Schmitt, female    DOB: Jul 07, 1928, 82 y.o.   MRN: [id number]  HPI  Patient presented with c/o bilateral lower anterior chest wall discomfort and soreness exacerbated with positional changes, but no exertional CP, dyspnea or congestion.  She also reports awakening with clear nasal drainage most mornings.   Medication Sig  . acetaminophen  325 MG tablet Take 2 tab every 6 hours as needed  . busPIRonen 10 MG tablet TAKE 1/2-1 tab 3 TIMES A DAY AS NEEDED  . OSCAL WITH D Take 1 tablet by mouth daily.  [name] Kitchen VITAMIN D3 2000 UNITS Take 4 tablets by mouth daily.   . citalopram 20 MG tablet TAKE 1/2-1 TABLET BY MOUTH DAILY  . fexofenadine ( 180 MG tablet Take 180 mg by mouth daily.   [name] Kitchen FLONASE nasal spray Place 2 sprays into both nostrils daily.  . hctz 25 MG tablet TAKE 1 TAB DAILY.  [name] Kitchen levothyroxine  50 MCG tablet TAKE 1 TABLET BY MOUTH DAILY  . magnesium 400  Take 1 tab 2 (two) times daily.  . MULITI-VIT w/ MINERALS  Take 1 tablet by mouth daily.  [name] Kitchen omeprazole  40 MG capsule TAKE 1 CAPSULE DAILY  . PROBIOTIC  Take 1 tablet by mouth daily.   [name] Kitchen terazosin 10 MG capsule TAKE 1 CAPSULE (10 MG TOTAL) BY MOUTH DAILY.  . traMADol 50 MG tablet Take 1 tabl 4 (four) times daily.   Allergies  Allergen Reactions  . Aspirin Swelling  . Celebrex [Celecoxib] Other (See Comments)    "my body lit up"  . Fosamax [Alendronate Sodium] Other (See Comments)    unknown  . Keflex [Cephalexin] Other (See Comments)    unknown  . Penicillins Other (See Comments)    unknown  . Prednisone Itching  . Prevacid [Lansoprazole] Other (See Comments)    unknown  . Sulfa Antibiotics Other (See  Comments)    unknown  . Tetanus Toxoids Other (See Comments)    unknown  . Tetracyclines & Related Nausea Only    Nausea    Past Medical History:  Diagnosis Date  . COPD (chronic obstructive pulmonary disease) ([place])   . GERD (gastroesophageal reflux disease)   . Hyperlipidemia   . Hypertension   . Osteoporosis   . Prediabetes   . Vitamin D deficiency     Review of Systems     Objective:   Physical Exam  BP 130/76   Pulse 88   Temp 97.5 F (36.4 C)   Resp 16   Ht 4' 11.5" (1.511 m)   Wt 179 lb 9.6 oz (81.5 kg)   BMI 35.67 kg/m   HEENT - Eac's patent. TM's Nl. EOM's full. PERRLA. NasoOroPharynx clear. Neck - supple. Nl Thyroid. Carotids 2+ & No bruits, nodes, JVD Chest - Clear equal BS w/o Rales, rhonchi, wheezes. (+) tender lower ant chest wall to palpitation. Cor - Nl HS. RRR w/o sig MGR. PP 1(+). No edema. Abd - No palpable organomegaly, masses or tenderness. BS nl. MS- FROM w/o deformities. Muscle  power, tone and bulk Nl. Gait Nl. Neuro - No obvious Cr N abnormalities. Sensory, motor and Cerebellar functions appear Nl w/o focal abnormalities. Psyche - Mental status normal & appropriate.  No delusions, ideations or obvious mood abnormalities.    Assessment & Plan:   1. Costochondritis  - Recc try heating pad or tylenol for her Chest Wall soreness.  - reassured  2. Post Nasal Drainage   - recc try Benadryl 1 to 2 tabs at hs

## 2016-11-02 ENCOUNTER — Encounter: Payer: Self-pay | Admitting: Internal Medicine

## 2016-11-02 ENCOUNTER — Ambulatory Visit (INDEPENDENT_AMBULATORY_CARE_PROVIDER_SITE_OTHER): Payer: Medicare Other | Admitting: Internal Medicine

## 2016-11-02 VITALS — BP 130/76 | HR 88 | Temp 97.5°F | Resp 16 | Ht 59.5 in | Wt 179.6 lb

## 2016-11-02 DIAGNOSIS — M940 Chondrocostal junction syndrome [Tietze]: Secondary | ICD-10-CM

## 2016-11-02 DIAGNOSIS — R0982 Postnasal drip: Secondary | ICD-10-CM

## 2016-11-02 NOTE — Patient Instructions (Signed)
Costochondritis Costochondritis is swelling and irritation (inflammation) of the tissue (cartilage) that connects your ribs to your breastbone (sternum). This causes pain in the front of your chest. Usually, the pain:  Starts gradually.  Is in more than one rib. This condition usually goes away on its own over time. Follow these instructions at home:  Do not do anything that makes your pain worse.  If directed, put ice on the painful area:  Put ice in a plastic bag.  Place a towel between your skin and the bag.  Leave the ice on for 20 minutes, 2-3 times a day.  If directed, put heat on the affected area as often as told by your doctor. Use the heat source that your doctor tells you to use, such as a moist heat pack or a heating pad.  Place a towel between your skin and the heat source.  Leave the heat on for 20-30 minutes.  Take off the heat if your skin turns bright red. This is very important if you cannot feel pain, heat, or cold. You may have a greater risk of getting burned.  Take over-the-counter and prescription medicines only as told by your doctor.  Return to your normal activities as told by your doctor. Ask your doctor what activities are safe for you.  Keep all follow-up visits as told by your doctor. This is important. Contact a doctor if:  You have chills or a fever.  Your pain does not go away or it gets worse.  You have a cough that does not go away. Get help right away if:  You are short of breath. This information is not intended to replace advice given to you by your health care provider. Make sure you discuss any questions you have with your health care provider. Document Released: 05/11/2008 Document Revised: 06/12/2016 Document Reviewed: 03/18/2016 Elsevier Interactive Patient Education  2017 [employer]  

## 2016-11-20 ENCOUNTER — Other Ambulatory Visit: Payer: Self-pay | Admitting: Internal Medicine

## 2016-12-12 ENCOUNTER — Other Ambulatory Visit: Payer: Self-pay | Admitting: Physician Assistant

## 2016-12-21 DIAGNOSIS — I739 Peripheral vascular disease, unspecified: Secondary | ICD-10-CM | POA: Diagnosis not present

## 2016-12-21 DIAGNOSIS — L603 Nail dystrophy: Secondary | ICD-10-CM | POA: Diagnosis not present

## 2016-12-30 ENCOUNTER — Other Ambulatory Visit: Payer: Self-pay | Admitting: Internal Medicine

## 2016-12-30 NOTE — Telephone Encounter (Signed)
Please call Tramadol

## 2017-01-07 ENCOUNTER — Encounter: Payer: Self-pay | Admitting: Internal Medicine

## 2017-01-07 ENCOUNTER — Ambulatory Visit (INDEPENDENT_AMBULATORY_CARE_PROVIDER_SITE_OTHER): Payer: Medicare Other | Admitting: Internal Medicine

## 2017-01-07 VITALS — BP 138/66 | HR 86 | Temp 98.0°F | Resp 16 | Ht 59.5 in | Wt 178.0 lb

## 2017-01-07 DIAGNOSIS — E782 Mixed hyperlipidemia: Secondary | ICD-10-CM | POA: Diagnosis not present

## 2017-01-07 DIAGNOSIS — E559 Vitamin D deficiency, unspecified: Secondary | ICD-10-CM

## 2017-01-07 DIAGNOSIS — R7303 Prediabetes: Secondary | ICD-10-CM

## 2017-01-07 DIAGNOSIS — I10 Essential (primary) hypertension: Secondary | ICD-10-CM

## 2017-01-07 DIAGNOSIS — E038 Other specified hypothyroidism: Secondary | ICD-10-CM

## 2017-01-07 DIAGNOSIS — Z79899 Other long term (current) drug therapy: Secondary | ICD-10-CM

## 2017-01-07 NOTE — Patient Instructions (Addendum)
Please start trying to pull your shoulder blades back together like you are trying to pinch somebody's finger.  Hold that position for 5 seconds and then relax.  Try to do 2 sets of 5 repetitions.  Please try pulling your chin back to a neutral position.  Try to hold it for 5 seconds.  Again try to do 2 sets of 5 repetitions.

## 2017-01-07 NOTE — Progress Notes (Signed)
Assessment and Plan:  Hypertension:  -Continue medication -monitor blood pressure at home. -Continue DASH diet -Reminder to go to the ER if any CP, SOB, nausea, dizziness, severe HA, changes vision/speech, left arm numbness and tingling and jaw pain.  Cholesterol - Continue diet and exercise   Prediabetes -Continue diet and exercise.   Vitamin D Def -continue medications.   Osteoarthritis -bilateral knee pain -cont tramadol -if we get samples of pensaid it would be beneficial for her  Severe kyphosis -recommended exercises to help with posture  Continue diet and meds as discussed. Further disposition pending results of labs. Discussed med's effects and SE's.    HPI 83 y.o. female  presents for 3 month follow up with hypertension, hyperlipidemia, diabetes and vitamin D deficiency.   Her blood pressure has been controlled at home, today their BP is  .She does not workout. She denies chest pain, shortness of breath, dizziness.   She is on cholesterol medication and denies myalgias. Her cholesterol is at goal. The cholesterol was:  10/06/2016: Cholesterol 172; HDL 61; LDL Cholesterol 90; Triglycerides 107   She has been working on diet for prediabetes without complications, she is on bASA, she is on ACE/ARB, and denies  foot ulcerations, hyperglycemia, hypoglycemia , increased appetite, nausea, paresthesia of the feet, polydipsia, polyuria, visual disturbances, vomiting and weight loss. Last A1C was: 10/06/2016: Hgb A1c MFr Bld 5.3   Patient is on Vitamin D supplement. 10/06/2016: Vit D, 25-Hydroxy 86  She reports that she has been having some issues with her bilateral knee pain with her osteoarthritis.  She reports that she has been taking good care of her skin. She reports that she is trying to avoid having to take her tramadol.  She reports that the tramadol is working well.    Current Medications:  Current Outpatient Prescriptions on File Prior to Visit  Medication Sig  Dispense Refill  . acetaminophen (TYLENOL) 325 MG tablet Take 2 tablets (650 mg total) by mouth every 6 (six) hours as needed (prn TEMP>38.1 Celsius). 30 tablet 2  . busPIRone (BUSPAR) 10 MG tablet TAKE 1/2 - 1 TABLET 3 TIMES A DAY AS NEEDED FOR ANXIETY 270 tablet 1  . calcium-vitamin D (OSCAL WITH D) 250-125 MG-UNIT per tablet Take 1 tablet by mouth daily.    . Cholecalciferol (VITAMIN D3) 2000 UNITS TABS Take 4 tablets by mouth daily.     . citalopram (CELEXA) 20 MG tablet TAKE 1/2-1 TABLET BY MOUTH DAILY 90 tablet 1  . fexofenadine (ALLEGRA) 180 MG tablet Take 180 mg by mouth daily.     . fluticasone (FLONASE) 50 MCG/ACT nasal spray Place 2 sprays into both nostrils daily. 16 g 0  . hydrochlorothiazide (HYDRODIURIL) 25 MG tablet TAKE 1 TABLET (25 MG TOTAL) BY MOUTH DAILY. 30 tablet 3  . levothyroxine (SYNTHROID, LEVOTHROID) 50 MCG tablet TAKE 1 TABLET BY MOUTH DAILY 90 tablet 3  . magnesium oxide (MAG-OX) 400 (241.3 MG) MG tablet Take 1 tablet (400 mg total) by mouth 2 (two) times daily. 60 tablet 2  . Multiple Vitamin (MULITIVITAMIN WITH MINERALS) TABS Take 1 tablet by mouth daily.    [name] Kitchen omeprazole (PRILOSEC) 40 MG capsule TAKE 1 CAPSULE DAILY 90 capsule 4  . Probiotic Product (PROBIOTIC FORMULA PO) Take 1 tablet by mouth daily.     [name] Kitchen terazosin (HYTRIN) 10 MG capsule TAKE 1 CAPSULE (10 MG TOTAL) BY MOUTH DAILY. 90 capsule 3  . traMADol (ULTRAM) 50 MG tablet TAKE 1 TABLET BY MOUTH 4 TIMES  A DAY 100 tablet 0   No current facility-administered medications on file prior to visit.    Medical History:  Past Medical History:  Diagnosis Date  . COPD (chronic obstructive pulmonary disease) ([place])   . GERD (gastroesophageal reflux disease)   . Hyperlipidemia   . Hypertension   . Osteoporosis   . Prediabetes   . Vitamin D deficiency    Allergies:  Allergies  Allergen Reactions  . Aspirin Swelling  . Celebrex [Celecoxib] Other (See Comments)    "my body lit up"  . Fosamax [Alendronate  Sodium] Other (See Comments)    unknown  . Keflex [Cephalexin] Other (See Comments)    unknown  . Penicillins Other (See Comments)    unknown  . Prednisone Itching  . Prevacid [Lansoprazole] Other (See Comments)    unknown  . Sulfa Antibiotics Other (See Comments)    unknown  . Tetanus Toxoids Other (See Comments)    unknown  . Tetracyclines & Related Nausea Only    Nausea      Review of Systems:  Review of Systems  Constitutional: Negative for chills, fever and malaise/fatigue.  HENT: Negative for congestion, ear pain and sore throat.   Eyes: Negative.   Respiratory: Negative for cough, shortness of breath and wheezing.   Cardiovascular: Negative for chest pain, palpitations and leg swelling.  Gastrointestinal: Negative for abdominal pain, blood in stool, constipation, diarrhea, heartburn and melena.  Genitourinary: Negative.   Skin: Negative.   Neurological: Negative for dizziness, sensory change, loss of consciousness and headaches.  Psychiatric/Behavioral: Negative for depression. The patient is not nervous/anxious and does not have insomnia.     Family history- Review and unchanged  Social history- Review and unchanged  Physical Exam: There were no vitals taken for this visit. Wt Readings from Last 3 Encounters:  11/02/16 179 lb 9.6 oz (81.5 kg)  10/06/16 179 lb 12.8 oz (81.6 kg)  06/22/16 176 lb (79.8 kg)   General Appearance: Well nourished well developed, non-toxic appearing, in no apparent distress. Eyes: PERRLA, EOMs, conjunctiva no swelling or erythema ENT/Mouth: Ear canals clear with no erythema, swelling, or discharge.  TMs normal bilaterally, oropharynx clear, moist, with no exudate.   Neck: Supple, thyroid normal, no JVD, no cervical adenopathy.  Respiratory: Respiratory effort normal, breath sounds clear A&P, no wheeze, rhonchi or rales noted.  No retractions, no accessory muscle usage Cardio: RRR with no MRGs. No noted edema.  Abdomen: Soft, + BS.   Non tender, no guarding, rebound, hernias, masses. Musculoskeletal: Severe Kyphosis, Full ROM, 5/5 strength, Ambulates with walker Skin: Warm, dry without rashes, lesions, ecchymosis.  Neuro: Awake and oriented X 3, Cranial nerves intact. No cerebellar symptoms.  Psych: normal affect, Insight and Judgment appropriate.    Starlyn Skeans, PA-C 2:37 PM [facility]

## 2017-01-26 DIAGNOSIS — H40013 Open angle with borderline findings, low risk, bilateral: Secondary | ICD-10-CM | POA: Diagnosis not present

## 2017-01-26 DIAGNOSIS — H40033 Anatomical narrow angle, bilateral: Secondary | ICD-10-CM | POA: Diagnosis not present

## 2017-02-01 ENCOUNTER — Other Ambulatory Visit: Payer: Self-pay | Admitting: Internal Medicine

## 2017-02-08 ENCOUNTER — Ambulatory Visit (INDEPENDENT_AMBULATORY_CARE_PROVIDER_SITE_OTHER): Payer: Medicare Other | Admitting: Internal Medicine

## 2017-02-08 VITALS — BP 138/66 | HR 92 | Temp 98.4°F | Resp 18 | Ht 59.5 in | Wt 176.0 lb

## 2017-02-08 DIAGNOSIS — J069 Acute upper respiratory infection, unspecified: Secondary | ICD-10-CM | POA: Diagnosis not present

## 2017-02-08 MED ORDER — AZELASTINE HCL 0.1 % NA SOLN: 2.0000 | Freq: Two times a day (BID) | NASAL | 2 refills | Status: DC

## 2017-02-08 MED ORDER — AZITHROMYCIN 250 MG PO TABS: ORAL_TABLET | ORAL | 0 refills | Status: DC

## 2017-02-08 MED ORDER — MONTELUKAST SODIUM 10 MG PO TABS: 10.0000 mg | ORAL_TABLET | Freq: Every day | ORAL | 2 refills | Status: DC

## 2017-02-08 NOTE — Patient Instructions (Signed)
Please continue to take allegra once daily.  Please start using your flonase 2 sprays per nostril at bedtime.  Please use astelin 1 spray per nostril in the morning and evening to help dry up congestion.  Please take the zpak daily with food until it is gone.  Please take delsym twice daily to help prevent coughing as needed.  Please take singulair once nightly at bedtime.  You can take 1 tablet of benadryl 25 mg at bedtime if you need some help sleeping.  Sleeping in a slightly more upright position will likely help minimize your cough (recliner).

## 2017-02-08 NOTE — Progress Notes (Signed)
HPI  Patient presents to the office for evaluation of cough.  It has been going on for 4 days.  Patient reports night > day, wet, barky, worse with lying down.  They also endorse change in voice, postnasal drip and nasal congestion, burning throat, no sinus pain, no ear pain, no headaches, no dizziness or lightheadedness.  .  They have tried none.  They report that nothing has worked.  They admits to other sick contacts.  Susam her daughter and grandaughter Margreta Journey are also sick.    Review of Systems  Constitutional: Positive for malaise/fatigue. Negative for chills and fever.  HENT: Positive for congestion, ear pain, hearing loss and sore throat.   Respiratory: Positive for cough. Negative for sputum production, shortness of breath and wheezing.   Cardiovascular: Negative for chest pain, palpitations and leg swelling.  Neurological: Positive for headaches.    PE:  Vitals:   02/08/17 1129  BP: 138/66  Pulse: 92  Resp: 18  Temp: 98.4 F (36.9 C)    General:  Alert and non-toxic, WDWN, NAD HEENT: NCAT, PERLA, EOM normal, no occular discharge or erythema.  Nasal mucosal edema with sinus tenderness to palpation.  Oropharynx clear with minimal oropharyngeal edema and erythema.  Mucous membranes moist and pink. Neck:  Cervical adenopathy Chest:  RRR no MRGs.  Lungs clear to auscultation A&P with no wheezes rhonchi or rales.   Abdomen: +BS x 4 quadrants, soft, non-tender, no guarding, rigidity, or rebound. Skin: warm and dry no rash Neuro: A&Ox4, CN II-XII grossly intact  Assessment and Plan:   1. Acute URI -cannot tolerate prednisone -flonase -astelin -zpak -continue taking allegra -singulair -delsym as needed for cough

## 2017-02-22 ENCOUNTER — Other Ambulatory Visit: Payer: Self-pay

## 2017-02-22 MED ORDER — HYDROCHLOROTHIAZIDE 25 MG PO TABS: ORAL_TABLET | ORAL | 0 refills | Status: DC

## 2017-02-24 ENCOUNTER — Other Ambulatory Visit: Payer: Self-pay | Admitting: Internal Medicine

## 2017-03-10 DIAGNOSIS — I739 Peripheral vascular disease, unspecified: Secondary | ICD-10-CM | POA: Diagnosis not present

## 2017-03-10 DIAGNOSIS — L603 Nail dystrophy: Secondary | ICD-10-CM | POA: Diagnosis not present

## 2017-03-22 ENCOUNTER — Other Ambulatory Visit: Payer: Self-pay | Admitting: Internal Medicine

## 2017-03-22 MED ORDER — TRAMADOL HCL 50 MG PO TABS: ORAL_TABLET | ORAL | 0 refills | Status: DC

## 2017-03-22 NOTE — Telephone Encounter (Signed)
Take 1 tablet every 4 hours only if needed for severe pain

## 2017-03-24 ENCOUNTER — Other Ambulatory Visit: Payer: Self-pay | Admitting: Internal Medicine

## 2017-03-24 NOTE — Telephone Encounter (Signed)
Tramadol was called into pharmacy on 18 th April 2018 @ 3:14 pm by DD

## 2017-04-13 ENCOUNTER — Other Ambulatory Visit: Payer: Self-pay | Admitting: *Deleted

## 2017-04-13 ENCOUNTER — Ambulatory Visit: Payer: Self-pay | Admitting: Internal Medicine

## 2017-04-13 ENCOUNTER — Ambulatory Visit (INDEPENDENT_AMBULATORY_CARE_PROVIDER_SITE_OTHER): Payer: Medicare Other | Admitting: Internal Medicine

## 2017-04-13 ENCOUNTER — Encounter: Payer: Self-pay | Admitting: Internal Medicine

## 2017-04-13 VITALS — BP 136/84 | HR 104 | Temp 97.3°F | Resp 16 | Ht 59.5 in | Wt 184.4 lb

## 2017-04-13 DIAGNOSIS — R7303 Prediabetes: Secondary | ICD-10-CM | POA: Diagnosis not present

## 2017-04-13 DIAGNOSIS — I10 Essential (primary) hypertension: Secondary | ICD-10-CM

## 2017-04-13 DIAGNOSIS — E559 Vitamin D deficiency, unspecified: Secondary | ICD-10-CM | POA: Diagnosis not present

## 2017-04-13 DIAGNOSIS — E782 Mixed hyperlipidemia: Secondary | ICD-10-CM | POA: Diagnosis not present

## 2017-04-13 DIAGNOSIS — Z79899 Other long term (current) drug therapy: Secondary | ICD-10-CM | POA: Diagnosis not present

## 2017-04-13 MED ORDER — CITALOPRAM HYDROBROMIDE 20 MG PO TABS: ORAL_TABLET | ORAL | 1 refills | Status: DC

## 2017-04-13 NOTE — Patient Instructions (Signed)

## 2017-04-13 NOTE — Progress Notes (Signed)
This very nice 83 y.o. WWF presents for 6 month follow up with Hypertension, Hyperlipidemia, Pre-Diabetes and Vitamin D Deficiency.      Patient is treated for HTN (1990) & BP has been controlled at home. Today's BP is at goal - 136/84. Patient has had no complaints of any cardiac type chest pain, palpitations, dyspnea/orthopnea/PND, dizziness, claudication, or dependent edema.     Hyperlipidemia is controlled with diet & meds. Patient denies myalgias or other med SE's. Last Lipids were at goal:  Lab Results  Component Value Date   CHOL 172 10/06/2016   HDL 61 10/06/2016   LDLCALC 90 10/06/2016   TRIG 107 10/06/2016   CHOLHDL 2.8 10/06/2016      Also, the patient has history of Morbid Obesity (BMI 35+) and consequent PreDiabetes (A1c 5.7% in 2014) and has had no symptoms of reactive hypoglycemia, diabetic polys, paresthesias or visual blurring.  Last A1c was at goal: Lab Results  Component Value Date   HGBA1C 5.3 10/06/2016      Further, the patient also has history of Vitamin D Deficiency and supplements vitamin D without any suspected side-effects. Last vitamin D was at goal:   Lab Results  Component Value Date   VD25OH 86 10/06/2016   Current Outpatient Prescriptions on File Prior to Visit  Medication Sig  . acetaminophen (TYLENOL) 325 MG tablet Take 2 tablets (650 mg total) by mouth every 6 (six) hours as needed (prn TEMP>38.1 Celsius).  [name] Kitchen azelastine (ASTELIN) 0.1 % nasal spray Place 2 sprays into both nostrils 2 (two) times daily. Use in each nostril as directed  . busPIRone (BUSPAR) 10 MG tablet TAKE 1/2 - 1 TABLET 3 TIMES A DAY AS NEEDED FOR ANXIETY  . calcium-vitamin D (OSCAL WITH D) 250-125 MG-UNIT per tablet Take 1 tablet by mouth daily.  . Cholecalciferol (VITAMIN D3) 2000 UNITS TABS Take 4 tablets by mouth daily.   . fexofenadine (ALLEGRA) 180 MG tablet Take 180 mg by mouth daily.   . fluticasone (FLONASE) 50 MCG/ACT nasal spray Place 2 sprays into both nostrils  daily.  . hydrochlorothiazide (HYDRODIURIL) 25 MG tablet TAKE 1 TABLET (25 MG TOTAL) BY MOUTH DAILY.  [name] Kitchen levothyroxine (SYNTHROID, LEVOTHROID) 50 MCG tablet TAKE 1 TABLET BY MOUTH DAILY  . magnesium oxide (MAG-OX) 400 (241.3 MG) MG tablet Take 1 tablet (400 mg total) by mouth 2 (two) times daily.  . montelukast (SINGULAIR) 10 MG tablet Take 1 tablet (10 mg total) by mouth daily.  . Multiple Vitamin (MULITIVITAMIN WITH MINERALS) TABS Take 1 tablet by mouth daily.  [name] Kitchen omeprazole (PRILOSEC) 40 MG capsule TAKE 1 CAPSULE DAILY  . Probiotic Product (PROBIOTIC FORMULA PO) Take 1 tablet by mouth daily.   [name] Kitchen terazosin (HYTRIN) 10 MG capsule TAKE 1 CAPSULE (10 MG TOTAL) BY MOUTH DAILY.  . traMADol (ULTRAM) 50 MG tablet TAKE 1 TABLET BY MOUTH FOUR TIMES A DAY   No current facility-administered medications on file prior to visit.    Allergies  Allergen Reactions  . Aspirin Swelling  . Celebrex [Celecoxib] Other (See Comments)    "my body lit up"  . Fosamax [Alendronate Sodium] Other (See Comments)    unknown  . Keflex [Cephalexin] Other (See Comments)    unknown  . Penicillins Other (See Comments)    unknown  . Prednisone Itching  . Prevacid [Lansoprazole] Other (See Comments)    unknown  . Sulfa Antibiotics Other (See Comments)    unknown  . Tetanus Toxoids Other (See  Comments)    unknown  . Tetracyclines & Related Nausea Only    Nausea    PMHx:   Past Medical History:  Diagnosis Date  . COPD (chronic obstructive pulmonary disease) ([place])   . GERD (gastroesophageal reflux disease)   . Hyperlipidemia   . Hypertension   . Osteoporosis   . Prediabetes   . Vitamin D deficiency    Immunization History  Administered Date(s) Administered  . Influenza, High Dose Seasonal PF 09/05/2014, 09/17/2015, 08/25/2016  . Pneumococcal Conjugate-13 11/20/2015  . Pneumococcal Polysaccharide-23 12/18/2007  . Zoster 07/25/2013   Past Surgical History:  Procedure Laterality Date  . TONSILLECTOMY      childhood  . VESICOVAGINAL FISTULA CLOSURE W/ TAH     FHx:    Reviewed / unchanged  SHx:    Reviewed / unchanged  Systems Review:  Constitutional: Denies fever, chills, wt changes, headaches, insomnia, fatigue, night sweats, change in appetite. Eyes: Denies redness, blurred vision, diplopia, discharge, itchy, watery eyes.  ENT: Denies discharge, congestion, post nasal drip, epistaxis, sore throat, earache, hearing loss, dental pain, tinnitus, vertigo, sinus pain, snoring.  CV: Denies chest pain, palpitations, irregular heartbeat, syncope, dyspnea, diaphoresis, orthopnea, PND, claudication or edema. Respiratory: denies cough, dyspnea, DOE, pleurisy, hoarseness, laryngitis, wheezing.  Gastrointestinal: Denies dysphagia, odynophagia, heartburn, reflux, water brash, abdominal pain or cramps, nausea, vomiting, bloating, diarrhea, constipation, hematemesis, melena, hematochezia  or hemorrhoids. Genitourinary: Denies dysuria, frequency, urgency, nocturia, hesitancy, discharge, hematuria or flank pain. Musculoskeletal: Denies arthralgias, myalgias, stiffness, jt. swelling, pain, limping or strain/sprain.  Skin: Denies pruritus, rash, hives, warts, acne, eczema or change in skin lesion(s). Neuro: No weakness, tremor, incoordination, spasms, paresthesia or pain. Psychiatric: Denies confusion, memory loss or sensory loss. Endo: Denies change in weight, skin or hair change.  Heme/Lymph: No excessive bleeding, bruising or enlarged lymph nodes.  Physical Exam  BP 136/84   Pulse (!) 104   Temp 97.3 F (36.3 C)   Resp 16   Ht 4' 11.5" (1.511 m)   Wt 184 lb 6.4 oz (83.6 kg)   BMI 36.62 kg/m   Appears well nourished, well groomed  and in no distress.  Eyes: PERRLA, EOMs, conjunctiva no swelling or erythema. Sinuses: No frontal/maxillary tenderness ENT/Mouth: EAC's clear, TM's nl w/o erythema, bulging. Nares clear w/o erythema, swelling, exudates. Oropharynx clear without erythema or exudates.  Oral hygiene is good. Tongue normal, non obstructing. Hearing intact.  Neck: Supple. Thyroid nl. Car 2+/2+ without bruits, nodes or JVD. Chest: Respirations nl with BS clear & equal w/o rales, rhonchi, wheezing or stridor.  Cor: Heart sounds normal w/ regular rate and rhythm without sig. murmurs, gallops, clicks or rubs. Peripheral pulses normal and equal  without edema.  Abdomen: Soft & bowel sounds normal. Non-tender w/o guarding, rebound, hernias, masses or organomegaly.  Lymphatics: Unremarkable.  Musculoskeletal: Full ROM all peripheral extremities, joint stability, 5/5 strength and  Gait stabilized with a rolling walker.  Skin: Warm, dry without exposed rashes, lesions or ecchymosis apparent.  Neuro: Cranial nerves intact, reflexes equal bilaterally. Sensory-motor testing grossly intact. Tendon reflexes grossly intact.  Pysch: Alert & oriented x 3.  Insight and judgement nl & appropriate. No ideations.  Assessment and Plan:  1. Essential hypertension  - Continue medication, monitor blood pressure at home.  - Continue DASH diet. Reminder to go to the ER if any CP,  SOB, nausea, dizziness, severe HA, changes vision/speech,  left arm numbness and tingling and jaw pain.  - CBC with Differential/Platelet - BASIC METABOLIC PANEL  WITH GFR - Magnesium - TSH  2. Hyperlipidemia, mixed  - Continue diet/meds, exercise,& lifestyle modifications.  - Continue monitor periodic cholesterol/liver & renal functions   - Hepatic function panel - Lipid panel - TSH  3. Prediabetes  - Continue diet, exercise, lifestyle modifications.  - Monitor appropriate labs.  - Hemoglobin A1c - Insulin, random  4. Vitamin D deficiency  - Continue supplementation.  - VITAMIN D 25 Hydroxy   5. Medication management  - CBC with Differential/Platelet - BASIC METABOLIC PANEL WITH GFR - Hepatic function panel - Magnesium - Lipid panel - TSH - Hemoglobin A1c - Insulin, random - VITAMIN D 25  Hydroxy        Discussed  regular exercise, BP monitoring, weight control to achieve/maintain BMI less than 25 and discussed med and SE's. Recommended labs to assess and monitor clinical status with further disposition pending results of labs. Over 30 minutes of exam, counseling, chart review was performed.

## 2017-04-14 LAB — CBC WITH DIFFERENTIAL/PLATELET
Basophils Absolute: 53 cells/uL (ref 0–200)
Basophils Relative: 1 %
Eosinophils Absolute: 106 cells/uL (ref 15–500)
Eosinophils Relative: 2 %
HCT: 40 % (ref 35.0–45.0)
Hemoglobin: 13.4 g/dL (ref 11.7–15.5)
Lymphocytes Relative: 43 %
Lymphs Abs: 2279 cells/uL (ref 850–3900)
MCH: 30.5 pg (ref 27.0–33.0)
MCHC: 33.5 g/dL (ref 32.0–36.0)
MCV: 90.9 fL (ref 80.0–100.0)
MPV: 10.4 fL (ref 7.5–12.5)
Monocytes Absolute: 636 cells/uL (ref 200–950)
Monocytes Relative: 12 %
Neutro Abs: 2226 cells/uL (ref 1500–7800)
Neutrophils Relative %: 42 %
Platelets: 167 10*3/uL (ref 140–400)
RBC: 4.4 MIL/uL (ref 3.80–5.10)
RDW: 14 % (ref 11.0–15.0)
WBC: 5.3 10*3/uL (ref 3.8–10.8)

## 2017-04-14 LAB — LIPID PANEL
Cholesterol: 174 mg/dL (ref <200)
HDL: 61 mg/dL (ref >50)
LDL Cholesterol: 89 mg/dL (ref <100)
Total CHOL/HDL Ratio: 2.9 Ratio (ref <5.0)
Triglycerides: 121 mg/dL (ref <150)
VLDL: 24 mg/dL (ref <30)

## 2017-04-14 LAB — MAGNESIUM: Magnesium: 1.9 mg/dL (ref 1.5–2.5)

## 2017-04-14 LAB — BASIC METABOLIC PANEL WITH GFR
BUN: 28 mg/dL — ABNORMAL HIGH (ref 7–25)
CO2: 27 mmol/L (ref 20–31)
Calcium: 9.8 mg/dL (ref 8.6–10.4)
Chloride: 100 mmol/L (ref 98–110)
Creat: 1.1 mg/dL — ABNORMAL HIGH (ref 0.60–0.88)
GFR, Est African American: 53 mL/min — ABNORMAL LOW (ref >=60)
GFR, Est Non African American: 46 mL/min — ABNORMAL LOW (ref >=60)
Glucose, Bld: 95 mg/dL (ref 65–99)
Potassium: 4.5 mmol/L (ref 3.5–5.3)
Sodium: 139 mmol/L (ref 135–146)

## 2017-04-14 LAB — HEPATIC FUNCTION PANEL
ALT: 30 U/L — ABNORMAL HIGH (ref 6–29)
AST: 45 U/L — ABNORMAL HIGH (ref 10–35)
Albumin: 4.3 g/dL (ref 3.6–5.1)
Alkaline Phosphatase: 45 U/L (ref 33–130)
Bilirubin, Direct: 0.2 mg/dL (ref <=0.2)
Indirect Bilirubin: 0.5 mg/dL (ref 0.2–1.2)
Total Bilirubin: 0.7 mg/dL (ref 0.2–1.2)
Total Protein: 7.7 g/dL (ref 6.1–8.1)

## 2017-04-14 LAB — TSH: TSH: 4.03 mIU/L

## 2017-04-14 LAB — HEMOGLOBIN A1C
Hgb A1c MFr Bld: 5.1 % (ref <5.7)
Mean Plasma Glucose: 100 mg/dL

## 2017-04-14 LAB — VITAMIN D 25 HYDROXY (VIT D DEFICIENCY, FRACTURES): Vit D, 25-Hydroxy: 88 ng/mL (ref 30–100)

## 2017-04-14 LAB — INSULIN, RANDOM: Insulin: 11.6 u[IU]/mL (ref 2.0–19.6)

## 2017-06-02 DIAGNOSIS — I739 Peripheral vascular disease, unspecified: Secondary | ICD-10-CM | POA: Diagnosis not present

## 2017-06-02 DIAGNOSIS — L603 Nail dystrophy: Secondary | ICD-10-CM | POA: Diagnosis not present

## 2017-06-15 ENCOUNTER — Other Ambulatory Visit: Payer: Self-pay | Admitting: Physician Assistant

## 2017-07-09 ENCOUNTER — Other Ambulatory Visit: Payer: Self-pay | Admitting: Internal Medicine

## 2017-07-12 ENCOUNTER — Other Ambulatory Visit: Payer: Self-pay | Admitting: Physician Assistant

## 2017-07-15 ENCOUNTER — Other Ambulatory Visit: Payer: Self-pay | Admitting: *Deleted

## 2017-07-15 ENCOUNTER — Other Ambulatory Visit: Payer: Self-pay | Admitting: Physician Assistant

## 2017-07-15 MED ORDER — TRAMADOL HCL 50 MG PO TABS: ORAL_TABLET | ORAL | 0 refills | Status: DC

## 2017-07-16 DIAGNOSIS — Z1231 Encounter for screening mammogram for malignant neoplasm of breast: Secondary | ICD-10-CM | POA: Diagnosis not present

## 2017-07-16 NOTE — Telephone Encounter (Signed)
Tramadol was called into pharmacy on 10th Aug 2018 at 8:23am by DD

## 2017-07-22 DIAGNOSIS — H2513 Age-related nuclear cataract, bilateral: Secondary | ICD-10-CM | POA: Diagnosis not present

## 2017-07-22 DIAGNOSIS — H40033 Anatomical narrow angle, bilateral: Secondary | ICD-10-CM | POA: Diagnosis not present

## 2017-07-22 DIAGNOSIS — H25013 Cortical age-related cataract, bilateral: Secondary | ICD-10-CM | POA: Diagnosis not present

## 2017-07-22 DIAGNOSIS — H40013 Open angle with borderline findings, low risk, bilateral: Secondary | ICD-10-CM | POA: Diagnosis not present

## 2017-07-22 LAB — HM DIABETES EYE EXAM

## 2017-08-03 ENCOUNTER — Ambulatory Visit: Payer: Self-pay | Admitting: Physician Assistant

## 2017-08-03 NOTE — Progress Notes (Signed)
This very nice 83 y.o. WWF presents for 3 month follow up with Hypertension, Hyperlipidemia, Pre-Diabetes and Vitamin D Deficiency. Patient ghas GERD controlled on her meds.      Patient is treated for HTN circa 1990 & BP has been controlled at home. Today's BP is at goal - 132/76. Patient has had no complaints of any cardiac type chest pain, palpitations, dyspnea/orthopnea/PND, dizziness, claudication, or dependent edema.     Hyperlipidemia is controlled with diet & meds. Patient denies myalgias or other med SE's. Last Lipids were at goal: Lab Results  Component Value Date   CHOL 174 04/13/2017   HDL 61 04/13/2017   LDLCALC 89 04/13/2017   TRIG 121 04/13/2017   CHOLHDL 2.9 04/13/2017      Also, she also has Morbid Obesity (BMI 35+) and PreDiabetes with A1c 5.7% in 2014 and has had no symptoms of reactive hypoglycemia, diabetic polys, paresthesias or visual blurring.  Last A1c was at goal: Lab Results  Component Value Date   HGBA1C 5.1 04/13/2017      Patient was dx'd Hypothyroid in 2011 and has been on Thyroid Replacement since.  Vitamin D Deficiency and supplements vitamin D without any suspected side-effects. Last vitamin D was at goal: Lab Results  Component Value Date   VD25OH 88 04/13/2017   Current Outpatient Prescriptions on File Prior to Visit  Medication Sig  . acetaminophen (TYLENOL) 325 MG tablet Take 2 tablets (650 mg total) by mouth every 6 (six) hours as needed (prn TEMP>38.1 Celsius).  [name] Kitchen azelastine (ASTELIN) 0.1 % nasal spray Place 2 sprays into both nostrils 2 (two) times daily. Use in each nostril as directed  . busPIRone (BUSPAR) 10 MG tablet TAKE 1/2 - 1 TABLET 3 TIMES A DAY AS NEEDED FOR ANXIETY  . calcium-vitamin D (OSCAL WITH D) 250-125 MG-UNIT per tablet Take 1 tablet by mouth daily.  . Cholecalciferol (VITAMIN D3) 2000 UNITS TABS Take 4 tablets by mouth daily.   . citalopram (CELEXA) 20 MG tablet TAKE 1/2-1 TABLET BY MOUTH DAILY  . fexofenadine (ALLEGRA)  180 MG tablet Take 180 mg by mouth daily.   . fluticasone (FLONASE) 50 MCG/ACT nasal spray Place 2 sprays into both nostrils daily.  . hydrochlorothiazide (HYDRODIURIL) 25 MG tablet TAKE 1 TABLET BY MOUTH EVERY DAY  . levothyroxine (SYNTHROID, LEVOTHROID) 50 MCG tablet TAKE 1 TABLET BY MOUTH DAILY  . magnesium oxide (MAG-OX) 400 (241.3 MG) MG tablet Take 1 tablet (400 mg total) by mouth 2 (two) times daily.  . montelukast (SINGULAIR) 10 MG tablet Take 1 tablet (10 mg total) by mouth daily.  . Multiple Vitamin (MULITIVITAMIN WITH MINERALS) TABS Take 1 tablet by mouth daily.  [name] Kitchen omeprazole (PRILOSEC) 40 MG capsule TAKE 1 CAPSULE DAILY  . Probiotic Product (PROBIOTIC FORMULA PO) Take 1 tablet by mouth daily.   [name] Kitchen terazosin (HYTRIN) 10 MG capsule TAKE 1 CAPSULE (10 MG TOTAL) BY MOUTH DAILY.  . traMADol (ULTRAM) 50 MG tablet TAKE 1 TABLET 4 TIMES A DAY   No current facility-administered medications on file prior to visit.    Allergies  Allergen Reactions  . Aspirin Swelling  . Celebrex [Celecoxib] Other (See Comments)    "my body lit up"  . Fosamax [Alendronate Sodium] Other (See Comments)    unknown  . Keflex [Cephalexin] Other (See Comments)    unknown  . Penicillins Other (See Comments)    unknown  . Prednisone Itching  . Prevacid [Lansoprazole] Other (See Comments)  unknown  . Sulfa Antibiotics Other (See Comments)    unknown  . Tetanus Toxoids Other (See Comments)    unknown  . Tetracyclines & Related Nausea Only    Nausea    PMHx:   Past Medical History:  Diagnosis Date  . COPD (chronic obstructive pulmonary disease) ([place])   . GERD (gastroesophageal reflux disease)   . Hyperlipidemia   . Hypertension   . Osteoporosis   . Prediabetes   . Vitamin D deficiency    Immunization History  Administered Date(s) Administered  . Influenza, High Dose Seasonal PF 09/05/2014, 09/17/2015, 08/25/2016  . Pneumococcal Conjugate-13 11/20/2015  . Pneumococcal Polysaccharide-23  12/18/2007  . Zoster 07/25/2013   Past Surgical History:  Procedure Laterality Date  . TONSILLECTOMY     childhood  . VESICOVAGINAL FISTULA CLOSURE W/ TAH     FHx:    Reviewed / unchanged  SHx:    Reviewed / unchanged  Systems Review:  Constitutional: Denies fever, chills, wt changes, headaches, insomnia, fatigue, night sweats, change in appetite. Eyes: Denies redness, blurred vision, diplopia, discharge, itchy, watery eyes.  ENT: Denies discharge, congestion, post nasal drip, epistaxis, sore throat, earache, hearing loss, dental pain, tinnitus, vertigo, sinus pain, snoring.  CV: Denies chest pain, palpitations, irregular heartbeat, syncope, dyspnea, diaphoresis, orthopnea, PND, claudication or edema. Respiratory: denies cough, dyspnea, DOE, pleurisy, hoarseness, laryngitis, wheezing.  Gastrointestinal: Denies dysphagia, odynophagia, heartburn, reflux, water brash, abdominal pain or cramps, nausea, vomiting, bloating, diarrhea, constipation, hematemesis, melena, hematochezia  or hemorrhoids. Genitourinary: Denies dysuria, frequency, urgency, nocturia, hesitancy, discharge, hematuria or flank pain. Musculoskeletal: Denies arthralgias, myalgias, stiffness, jt. swelling, pain, limping or strain/sprain.  Skin: Denies pruritus, rash, hives, warts, acne, eczema or change in skin lesion(s). Neuro: No weakness, tremor, incoordination, spasms, paresthesia or pain. Psychiatric: Denies confusion, memory loss or sensory loss. Endo: Denies change in weight, skin or hair change.  Heme/Lymph: No excessive bleeding, bruising or enlarged lymph nodes.  Physical Exam  BP 132/76   Pulse (!) 105   Temp (!) 97.3 F (36.3 C)   Resp 16   Ht 4' 11.5" (1.511 m)   Wt 176 lb 14.4 oz (80.2 kg)   SpO2 95%   BMI 35.13 kg/m   Appears well nourished, well groomed  and in no distress.  Eyes: PERRLA, EOMs, conjunctiva no swelling or erythema. Sinuses: No frontal/maxillary tenderness ENT/Mouth: EAC's clear,  TM's nl w/o erythema, bulging. Nares clear w/o erythema, swelling, exudates. Oropharynx clear without erythema or exudates. Oral hygiene is good. Tongue normal, non obstructing. Hearing intact.  Neck: Supple. Thyroid nl. Car 2+/2+ without bruits, nodes or JVD. Chest: Respirations nl with BS clear & equal w/o rales, rhonchi, wheezing or stridor.  Cor: Heart sounds normal w/ regular rate and rhythm without sig. murmurs, gallops, clicks or rubs. Peripheral pulses normal and equal  without edema.  Abdomen: Soft & bowel sounds normal. Non-tender w/o guarding, rebound, hernias, masses or organomegaly.  Lymphatics: Unremarkable.  Musculoskeletal: Full ROM all peripheral extremities, joint stability, 5/5 strength and normal gait.  Skin: Warm, dry without exposed rashes, lesions or ecchymosis apparent.  Neuro: Cranial nerves intact, reflexes equal bilaterally. Sensory-motor testing grossly intact. Tendon reflexes grossly intact.  Pysch: Alert & oriented x 3.  Insight and judgement nl & appropriate. No ideations.  Assessment and Plan:  - Continue medication, monitor blood pressure at home.  - Continue DASH diet. Reminder to go to the ER if any CP,  SOB, nausea, dizziness, severe HA, changes vision/speech.  - Continue diet/meds,  exercise,& lifestyle modifications.  - Continue monitor periodic cholesterol/liver & renal functions   - Continue diet, exercise, lifestyle modifications.  - Monitor appropriate labs. - Continue supplementation.      Discussed  regular exercise, BP monitoring, weight control to achieve/maintain BMI less than 25 and discussed med and SE's. Recommended labs to assess and monitor clinical status with further disposition pending results of labs. Over 30 minutes of exam, counseling, chart review was performed.

## 2017-08-04 ENCOUNTER — Encounter: Payer: Self-pay | Admitting: Physician Assistant

## 2017-08-04 ENCOUNTER — Ambulatory Visit (INDEPENDENT_AMBULATORY_CARE_PROVIDER_SITE_OTHER): Payer: Medicare Other | Admitting: Internal Medicine

## 2017-08-04 VITALS — BP 132/76 | HR 105 | Temp 97.3°F | Resp 16 | Ht 59.5 in | Wt 176.9 lb

## 2017-08-04 DIAGNOSIS — Z79899 Other long term (current) drug therapy: Secondary | ICD-10-CM | POA: Diagnosis not present

## 2017-08-04 DIAGNOSIS — R7303 Prediabetes: Secondary | ICD-10-CM | POA: Diagnosis not present

## 2017-08-04 DIAGNOSIS — E782 Mixed hyperlipidemia: Secondary | ICD-10-CM

## 2017-08-04 DIAGNOSIS — J42 Unspecified chronic bronchitis: Secondary | ICD-10-CM | POA: Diagnosis not present

## 2017-08-04 DIAGNOSIS — E038 Other specified hypothyroidism: Secondary | ICD-10-CM | POA: Diagnosis not present

## 2017-08-04 DIAGNOSIS — E559 Vitamin D deficiency, unspecified: Secondary | ICD-10-CM

## 2017-08-04 DIAGNOSIS — I10 Essential (primary) hypertension: Secondary | ICD-10-CM | POA: Diagnosis not present

## 2017-08-04 NOTE — Patient Instructions (Signed)

## 2017-08-05 LAB — CBC WITH DIFFERENTIAL/PLATELET
Basophils Absolute: 42 cells/uL (ref 0–200)
Basophils Relative: 0.8 %
Eosinophils Absolute: 21 cells/uL (ref 15–500)
Eosinophils Relative: 0.4 %
HCT: 41.1 % (ref 35.0–45.0)
Hemoglobin: 13.8 g/dL (ref 11.7–15.5)
Lymphs Abs: 1532 cells/uL (ref 850–3900)
MCH: 29.9 pg (ref 27.0–33.0)
MCHC: 33.6 g/dL (ref 32.0–36.0)
MCV: 89 fL (ref 80.0–100.0)
MPV: 11.1 fL (ref 7.5–12.5)
Monocytes Relative: 8.9 %
Neutro Abs: 3233 cells/uL (ref 1500–7800)
Neutrophils Relative %: 61 %
Platelets: 177 10*3/uL (ref 140–400)
RBC: 4.62 10*6/uL (ref 3.80–5.10)
RDW: 12.8 % (ref 11.0–15.0)
Total Lymphocyte: 28.9 %
WBC mixed population: 472 cells/uL (ref 200–950)
WBC: 5.3 10*3/uL (ref 3.8–10.8)

## 2017-08-05 LAB — LIPID PANEL
Cholesterol: 166 mg/dL (ref <200)
HDL: 67 mg/dL (ref >50)
LDL Cholesterol (Calc): 83 mg/dL (calc)
Non-HDL Cholesterol (Calc): 99 mg/dL (calc) (ref <130)
Total CHOL/HDL Ratio: 2.5 (calc) (ref <5.0)
Triglycerides: 84 mg/dL (ref <150)

## 2017-08-05 LAB — BASIC METABOLIC PANEL WITH GFR
BUN/Creatinine Ratio: 23 (calc) — ABNORMAL HIGH (ref 6–22)
BUN: 27 mg/dL — ABNORMAL HIGH (ref 7–25)
CO2: 29 mmol/L (ref 20–32)
Calcium: 9.6 mg/dL (ref 8.6–10.4)
Chloride: 98 mmol/L (ref 98–110)
Creat: 1.15 mg/dL — ABNORMAL HIGH (ref 0.60–0.88)
GFR, Est African American: 50 mL/min/{1.73_m2} — ABNORMAL LOW (ref >=60)
GFR, Est Non African American: 43 mL/min/{1.73_m2} — ABNORMAL LOW (ref >=60)
Glucose, Bld: 86 mg/dL (ref 65–99)
Potassium: 3.9 mmol/L (ref 3.5–5.3)
Sodium: 137 mmol/L (ref 135–146)

## 2017-08-05 LAB — HEPATIC FUNCTION PANEL
AG Ratio: 1.4 (calc) (ref 1.0–2.5)
ALT: 31 U/L — ABNORMAL HIGH (ref 6–29)
AST: 40 U/L — ABNORMAL HIGH (ref 10–35)
Albumin: 4.4 g/dL (ref 3.6–5.1)
Alkaline phosphatase (APISO): 46 U/L (ref 33–130)
Bilirubin, Direct: 0.2 mg/dL (ref 0.0–0.2)
Globulin: 3.1 g/dL (calc) (ref 1.9–3.7)
Indirect Bilirubin: 0.6 mg/dL (calc) (ref 0.2–1.2)
Total Bilirubin: 0.8 mg/dL (ref 0.2–1.2)
Total Protein: 7.5 g/dL (ref 6.1–8.1)

## 2017-08-05 LAB — TSH: TSH: 2.58 mIU/L (ref 0.40–4.50)

## 2017-08-05 LAB — HEMOGLOBIN A1C
Hgb A1c MFr Bld: 5.3 % of total Hgb (ref <5.7)
Mean Plasma Glucose: 105 (calc)
eAG (mmol/L): 5.8 (calc)

## 2017-08-05 LAB — VITAMIN D 25 HYDROXY (VIT D DEFICIENCY, FRACTURES): Vit D, 25-Hydroxy: 91 ng/mL (ref 30–100)

## 2017-08-05 LAB — INSULIN, FASTING: Insulin: 7 u[IU]/mL (ref 2.0–19.6)

## 2017-08-05 LAB — MAGNESIUM: Magnesium: 1.9 mg/dL (ref 1.5–2.5)

## 2017-08-07 ENCOUNTER — Encounter: Payer: Self-pay | Admitting: Internal Medicine

## 2017-08-17 ENCOUNTER — Encounter: Payer: Self-pay | Admitting: Internal Medicine

## 2017-08-23 ENCOUNTER — Encounter: Payer: Self-pay | Admitting: Internal Medicine

## 2017-09-03 DIAGNOSIS — I739 Peripheral vascular disease, unspecified: Secondary | ICD-10-CM | POA: Diagnosis not present

## 2017-09-03 DIAGNOSIS — L603 Nail dystrophy: Secondary | ICD-10-CM | POA: Diagnosis not present

## 2017-09-16 ENCOUNTER — Ambulatory Visit: Payer: Self-pay | Admitting: Physician Assistant

## 2017-09-21 DIAGNOSIS — H35033 Hypertensive retinopathy, bilateral: Secondary | ICD-10-CM | POA: Insufficient documentation

## 2017-09-21 NOTE — Progress Notes (Signed)
MEDICARE ANNUAL WELLNESS VISIT AND FOLLOW UP  Assessment:   Alyssa Schmitt was seen today for follow-up.  Diagnoses and all orders for this visit:  Essential hypertension -     Continue hctz 25 mg daily, terazosin 10 mg daily -     CBC with Differential/Platelet -     BASIC METABOLIC PANEL WITH GFR -     Magnesium  Chronic bronchitis, unspecified chronic bronchitis type (HCC)       -     Stable; patient reports she is able to complete ADLs without difficulty  Gastroesophageal reflux disease, esophagitis presence not specified -     Magnesium -     Continue H2blocker/PPI  Hypothyroidism -     Continue levothyroxine 50 mcg daily -     TSH -     Lipid panel  Osteoporosis, unspecified osteoporosis type, unspecified pathological fracture presence -     VITAMIN D 25 Hydroxy (Vit-D Deficiency, Fractures), calcium supplementation -     Has not tolerated medications -     Encouraged to continue with home exercises/weights -     Consider PT as needed for maintenance of strength and stability  Vitamin D deficiency -     VITAMIN D 25 Hydroxy (Vit-D Deficiency, Fractures)  Obesity (BMI 30-39.9)      -      Patient is working on home exercises; BMI trending down slowly. Discussed diet, encouraged reduced sugar/white flour products, push vegetables, lean proteins, whole grains.   Depression, major, in remission ([place])      -      Currently well managed with celexa 20 mg daily, buspar 10 mg TID PRN anxiety  Medication management -     CBC with Differential/Platelet -     BASIC METABOLIC PANEL WITH GFR -     Hepatic function panel -     TSH -     Lipid panel -     Hemoglobin A1c -     Magnesium -     VITAMIN D 25 Hydroxy (Vit-D Deficiency, Fractures)  Hypertensive retinopathy of both eyes      -      Continue close monitoring of BPs; continue annual follow up with Dr. Payton [facility] office  Varicose veins of bilateral lower extremities with other complications        -      Continue with  support hose daily  Prediabetes -     Continue diet/exercise, controlled weight loss -     Hemoglobin A1c  Need for immunization against influenza -     Flu vaccine HIGH DOSE PF (Fluzone High dose)    Over 40 minutes of exam, counseling, chart review and critical decision making was performed Future Appointments Date Time Provider [place]  11/11/2017 2:00 PM Unk Pinto, MD [facility] None     Plan:   During the course of the visit the patient was educated and counseled about appropriate screening and preventive services including:    Pneumococcal vaccine   Prevnar 13  Influenza vaccine  Td vaccine  Screening electrocardiogram  Bone densitometry screening  Colorectal cancer screening  Diabetes screening  Glaucoma screening  Nutrition counseling   Advanced directives: requested   Subjective:  Alyssa Schmitt is a 83 y.o. very pleasant [place] female who presents for Medicare Annual Wellness Visit and 3 month follow up on chronic medical conditions. She denies any concerns today, reports she has been very well these past few months, and has been working hard  on a home exercise routine that she can do from her chair and with her walker. She is driven to our visit today by her daughter whom she lives with after her husband passed away.   Her blood pressure has been controlled at home, today their BP is BP: 116/72  She does workout. She denies chest pain, shortness of breath, dizziness.   She is not on cholesterol medication and denies myalgias. Her cholesterol is at goal. The cholesterol last visit was:   Lab Results  Component Value Date   CHOL 166 08/04/2017   HDL 67 08/04/2017   LDLCALC 89 04/13/2017   TRIG 84 08/04/2017   CHOLHDL 2.5 08/04/2017    She has been working on diet and exercise for prediabetes, and denies increased appetite, nausea, paresthesia of the feet, polydipsia and polyuria. Last A1C in the office was:  Lab Results   Component Value Date   HGBA1C 5.3 08/04/2017   She is on thyroid medication. Her medication was not changed last visit.   Lab Results  Component Value Date   TSH 2.58 08/04/2017   Patient is on Vitamin D supplement and at goal:  Lab Results  Component Value Date   VD25OH 91 08/04/2017      Medication Review: Current Outpatient Prescriptions on File Prior to Visit  Medication Sig Dispense Refill  . acetaminophen (TYLENOL) 325 MG tablet Take 2 tablets (650 mg total) by mouth every 6 (six) hours as needed (prn TEMP>38.1 Celsius). 30 tablet 2  . azelastine (ASTELIN) 0.1 % nasal spray Place 2 sprays into both nostrils 2 (two) times daily. Use in each nostril as directed 30 mL 2  . busPIRone (BUSPAR) 10 MG tablet TAKE 1/2 - 1 TABLET 3 TIMES A DAY AS NEEDED FOR ANXIETY 270 tablet 1  . calcium-vitamin D (OSCAL WITH D) 250-125 MG-UNIT per tablet Take 1 tablet by mouth daily.    . Cholecalciferol (VITAMIN D3) 2000 UNITS TABS Take 4 tablets by mouth daily.     . citalopram (CELEXA) 20 MG tablet TAKE 1/2-1 TABLET BY MOUTH DAILY 90 tablet 1  . fexofenadine (ALLEGRA) 180 MG tablet Take 180 mg by mouth daily.     . fluticasone (FLONASE) 50 MCG/ACT nasal spray Place 2 sprays into both nostrils daily. 16 g 0  . hydrochlorothiazide (HYDRODIURIL) 25 MG tablet TAKE 1 TABLET BY MOUTH EVERY DAY 90 tablet 1  . levothyroxine (SYNTHROID, LEVOTHROID) 50 MCG tablet TAKE 1 TABLET BY MOUTH DAILY 90 tablet 3  . magnesium oxide (MAG-OX) 400 (241.3 MG) MG tablet Take 1 tablet (400 mg total) by mouth 2 (two) times daily. 60 tablet 2  . montelukast (SINGULAIR) 10 MG tablet Take 1 tablet (10 mg total) by mouth daily. 30 tablet 2  . Multiple Vitamin (MULITIVITAMIN WITH MINERALS) TABS Take 1 tablet by mouth daily.    [name] Kitchen omeprazole (PRILOSEC) 40 MG capsule TAKE 1 CAPSULE DAILY 90 capsule 4  . Probiotic Product (PROBIOTIC FORMULA PO) Take 1 tablet by mouth daily.     [name] Kitchen terazosin (HYTRIN) 10 MG capsule TAKE 1 CAPSULE (10  MG TOTAL) BY MOUTH DAILY. 90 capsule 3  . traMADol (ULTRAM) 50 MG tablet TAKE 1 TABLET 4 TIMES A DAY 100 tablet 0   No current facility-administered medications on file prior to visit.     Allergies  Allergen Reactions  . Aspirin Swelling  . Celebrex [Celecoxib] Other (See Comments)    "my body lit up"  . Fosamax [Alendronate Sodium] Other (See Comments)  unknown  . Keflex [Cephalexin] Other (See Comments)    unknown  . Penicillins Other (See Comments)    unknown  . Prednisone Itching  . Prevacid [Lansoprazole] Other (See Comments)    unknown  . Sulfa Antibiotics Other (See Comments)    unknown  . Tetanus Toxoids Other (See Comments)    unknown  . Tetracyclines & Related Nausea Only    Nausea     Current Problems (verified) Patient Active Problem List   Diagnosis Date Noted  . Hypertensive retinopathy of both eyes 09/21/2017  . Depression, major, in remission ([place]) 06/22/2016  . COPD 08/15/2015  . GERD  08/15/2015  . Osteoporosis 07/16/2015  . Hypothyroidism   . Obesity (BMI 30-39.9) 08/01/2014  . Medication management 03/20/2014  . Hypertension   . Prediabetes   . Vitamin D deficiency   . Varicose veins of bilateral lower extremities with other complications 67/61/9509    Screening Tests Immunization History  Administered Date(s) Administered  . Influenza, High Dose Seasonal PF 09/05/2014, 09/17/2015, 08/25/2016, 09/22/2017  . Pneumococcal Conjugate-13 11/20/2015  . Pneumococcal Polysaccharide-23 12/18/2007  . Zoster 07/25/2013   Preventative care: Last colonoscopy: 2001 will not get another Last mammogram:07/19/2017 Last pap smear/pelvic exam: Remote with Dr. Philis Pique   DEXA: 2016 T -3.1 Intolerant of medications  Echo 2013  Prior vaccinations: TD or Tdap: ALLERGY  Influenza: 09/22/2018 Pneumococcal: 2009 Prevnar 13: 2016 Shingles/Zostavax: 2014   Names of Other Physician/Practitioners you currently use: 1. [facility] here for primary care 2. Dr. Marshall Cork, eye doctor, last visit early this year (2018), cannot remember exact date.  3. Wears full dentures, last dentist several years ago, denies issues  Patient Care Team: Unk Pinto, MD as PCP - General (Internal Medicine) Nahser, Wonda Cheng, MD (Cardiology) Unk Pinto, MD as Attending Physician (Internal Medicine) Kelby Aline, PA-C as Physician Assistant (Physician Assistant) Monna Fam, MD as Consulting Physician (Ophthalmology) Mal Misty, MD as Consulting Physician (Vascular Surgery) Despina Hick, MD as Referring Physician (Radiology) Garald Balding, MD as Consulting Physician (Orthopedic Surgery) Clarene Essex, MD as Consulting Physician (Gastroenterology)  SURGICAL HISTORY She  has a past surgical history that includes Tonsillectomy and Vesicovaginal fistula closure w/ TAH. FAMILY HISTORY Her family history includes Diabetes in her father; Heart disease in her mother; Other in her brother and mother. SOCIAL HISTORY She  reports that she has never smoked. She has never used smokeless tobacco. She reports that she does not drink alcohol or use drugs.   MEDICARE WELLNESS OBJECTIVES: Physical activity: Current Exercise Habits: Home exercise routine, Type of exercise: strength training/weights, Time (Minutes): 15, Frequency (Times/Week): 7, Weekly Exercise (Minutes/Week): 105, Intensity: Mild Cardiac risk factors: Cardiac Risk Factors include: advanced age (>86men, >12 women);hypertension;obesity (BMI >30kg/m2);smoking/ tobacco exposure Depression/mood screen:   Depression screen [facility] 2/9 09/22/2017  Decreased Interest 0  Down, Depressed, Hopeless 0  PHQ - 2 Score 0    ADLs:  In your present state of health, do you have any difficulty performing the following activities: 09/22/2017 08/07/2017  Hearing? N N  Vision? N N  Difficulty concentrating or making decisions? N N  Walking or climbing  stairs? N N  Dressing or bathing? N N  Doing errands, shopping? N N  Some recent data might be hidden     Cognitive Testing  Alert? Yes  Normal Appearance?Yes  Oriented to person? Yes  Place? Yes   Time? Yes  Recall of three objects?  Yes  Can  perform simple calculations? Yes  Displays appropriate judgment?Yes  Can read the correct time from a watch face?Yes  EOL planning: Does Patient Have a Medical Advance Directive?: Yes Type of Advance Directive: Living will, Healthcare Power of Attorney Does patient want to make changes to medical advance directive?: No - Patient declined  Review of Systems  Constitutional: Negative.  Negative for malaise/fatigue.  HENT: Negative for congestion, hearing loss and sore throat.   Eyes: Negative for blurred vision.  Respiratory: Positive for sputum production (AM with awakening; clear). Negative for cough, shortness of breath and wheezing.   Cardiovascular: Negative for chest pain, palpitations, orthopnea, claudication and leg swelling.  Gastrointestinal: Negative for abdominal pain, blood in stool, constipation, diarrhea, heartburn, melena, nausea and vomiting.  Genitourinary: Negative.  Negative for frequency.  Musculoskeletal: Negative for falls, joint pain and myalgias.  Skin: Negative.   Neurological: Negative for dizziness, tingling, tremors, sensory change, focal weakness, weakness and headaches.  Endo/Heme/Allergies: Negative.  Negative for polydipsia. Does not bruise/bleed easily.  Psychiatric/Behavioral: Negative.  Negative for depression, memory loss and suicidal ideas.     Objective:     Today's Vitals   09/22/17 1551  BP: 116/72  Pulse: 91  Temp: (!) 97.5 F (36.4 C)  SpO2: 94%  Weight: 177 lb (80.3 kg)  Height: 5' (1.524 m)   Body mass index is 34.57 kg/m.  General appearance: alert, no distress, WD/WN, female with walker HEENT: normocephalic, sclerae anicteric, TMs pearly, nares patent, no discharge or erythema,  pharynx normal Oral cavity: MMM, no lesions, upper and lower dentures present Neck: supple, no lymphadenopathy, no palpable thyromegaly, no masses Heart: RRR, normal S1, S2, 2/6 early systolic decreshendo murmur. Patient reports + history of murmur.  Lungs: CTA bilaterally, no wheezes, rhonchi, or rales Abdomen: +bs, soft, non tender, non distended, no masses, no hepatomegaly, no splenomegaly Musculoskeletal: nontender, no swelling, some kyphosis of upper back with walker use for ambulation Extremities: no edema, no cyanosis, no clubbing Pulses: 2+ symmetric, upper and lower extremities, normal cap refill Neurological: alert, oriented x 3, CN2-12 intact, weak but symmetric upper and lower extremity strength, sensation normal throughout, DTRs 2+ throughout, no cerebellar signs, gait slow with walker Psychiatric: normal affect, behavior normal, pleasant   Medicare Attestation I have personally reviewed: The patient's medical and social history Their use of alcohol, tobacco or illicit drugs Their current medications and supplements The patient's functional ability including ADLs,fall risks, home safety risks, cognitive, and hearing and visual impairment Diet and physical activities Evidence for depression or mood disorders  The patient's weight, height, BMI, and visual acuity have been recorded in the chart.  I have made referrals, counseling, and provided education to the patient based on review of the above and I have provided the patient with a written personalized care plan for preventive services.     Izora Ribas, NP   09/22/2017

## 2017-09-22 ENCOUNTER — Ambulatory Visit (INDEPENDENT_AMBULATORY_CARE_PROVIDER_SITE_OTHER): Payer: Medicare Other | Admitting: Adult Health

## 2017-09-22 ENCOUNTER — Encounter: Payer: Self-pay | Admitting: Adult Health

## 2017-09-22 VITALS — BP 116/72 | HR 91 | Temp 97.5°F | Ht 60.0 in | Wt 177.0 lb

## 2017-09-22 DIAGNOSIS — E038 Other specified hypothyroidism: Secondary | ICD-10-CM | POA: Diagnosis not present

## 2017-09-22 DIAGNOSIS — R6889 Other general symptoms and signs: Secondary | ICD-10-CM

## 2017-09-22 DIAGNOSIS — Z0001 Encounter for general adult medical examination with abnormal findings: Secondary | ICD-10-CM

## 2017-09-22 DIAGNOSIS — F325 Major depressive disorder, single episode, in full remission: Secondary | ICD-10-CM

## 2017-09-22 DIAGNOSIS — R7303 Prediabetes: Secondary | ICD-10-CM

## 2017-09-22 DIAGNOSIS — Z23 Encounter for immunization: Secondary | ICD-10-CM

## 2017-09-22 DIAGNOSIS — K219 Gastro-esophageal reflux disease without esophagitis: Secondary | ICD-10-CM | POA: Diagnosis not present

## 2017-09-22 DIAGNOSIS — I10 Essential (primary) hypertension: Secondary | ICD-10-CM

## 2017-09-22 DIAGNOSIS — J42 Unspecified chronic bronchitis: Secondary | ICD-10-CM | POA: Diagnosis not present

## 2017-09-22 DIAGNOSIS — Z79899 Other long term (current) drug therapy: Secondary | ICD-10-CM

## 2017-09-22 DIAGNOSIS — E669 Obesity, unspecified: Secondary | ICD-10-CM

## 2017-09-22 DIAGNOSIS — E559 Vitamin D deficiency, unspecified: Secondary | ICD-10-CM | POA: Diagnosis not present

## 2017-09-22 DIAGNOSIS — I83893 Varicose veins of bilateral lower extremities with other complications: Secondary | ICD-10-CM

## 2017-09-22 DIAGNOSIS — M810 Age-related osteoporosis without current pathological fracture: Secondary | ICD-10-CM

## 2017-09-22 DIAGNOSIS — Z0000 Encounter for general adult medical examination without abnormal findings: Secondary | ICD-10-CM

## 2017-09-22 DIAGNOSIS — H35033 Hypertensive retinopathy, bilateral: Secondary | ICD-10-CM

## 2017-09-22 NOTE — Patient Instructions (Signed)

## 2017-09-23 LAB — CBC WITH DIFFERENTIAL/PLATELET
Basophils Absolute: 42 cells/uL (ref 0–200)
Basophils Relative: 0.8 %
Eosinophils Absolute: 52 cells/uL (ref 15–500)
Eosinophils Relative: 1 %
HCT: 41.1 % (ref 35.0–45.0)
Hemoglobin: 14 g/dL (ref 11.7–15.5)
Lymphs Abs: 1804 cells/uL (ref 850–3900)
MCH: 30.4 pg (ref 27.0–33.0)
MCHC: 34.1 g/dL (ref 32.0–36.0)
MCV: 89.2 fL (ref 80.0–100.0)
MPV: 11.3 fL (ref 7.5–12.5)
Monocytes Relative: 9.8 %
Neutro Abs: 2792 cells/uL (ref 1500–7800)
Neutrophils Relative %: 53.7 %
Platelets: 166 10*3/uL (ref 140–400)
RBC: 4.61 10*6/uL (ref 3.80–5.10)
RDW: 12.6 % (ref 11.0–15.0)
Total Lymphocyte: 34.7 %
WBC mixed population: 510 cells/uL (ref 200–950)
WBC: 5.2 10*3/uL (ref 3.8–10.8)

## 2017-09-23 LAB — BASIC METABOLIC PANEL WITH GFR
BUN/Creatinine Ratio: 22 (calc) (ref 6–22)
BUN: 25 mg/dL (ref 7–25)
CO2: 23 mmol/L (ref 20–32)
Calcium: 10.3 mg/dL (ref 8.6–10.4)
Chloride: 98 mmol/L (ref 98–110)
Creat: 1.13 mg/dL — ABNORMAL HIGH (ref 0.60–0.88)
GFR, Est African American: 51 mL/min/{1.73_m2} — ABNORMAL LOW (ref >=60)
GFR, Est Non African American: 44 mL/min/{1.73_m2} — ABNORMAL LOW (ref >=60)
Glucose, Bld: 92 mg/dL (ref 65–99)
Potassium: 4 mmol/L (ref 3.5–5.3)
Sodium: 139 mmol/L (ref 135–146)

## 2017-09-23 LAB — TSH: TSH: 2.2 mIU/L (ref 0.40–4.50)

## 2017-09-23 LAB — HEMOGLOBIN A1C
Hgb A1c MFr Bld: 5.4 % of total Hgb (ref <5.7)
Mean Plasma Glucose: 108 (calc)
eAG (mmol/L): 6 (calc)

## 2017-09-23 LAB — HEPATIC FUNCTION PANEL
AG Ratio: 1.3 (calc) (ref 1.0–2.5)
ALT: 22 U/L (ref 6–29)
AST: 33 U/L (ref 10–35)
Albumin: 4.5 g/dL (ref 3.6–5.1)
Alkaline phosphatase (APISO): 48 U/L (ref 33–130)
Bilirubin, Direct: 0.1 mg/dL (ref 0.0–0.2)
Globulin: 3.6 g/dL (calc) (ref 1.9–3.7)
Indirect Bilirubin: 0.6 mg/dL (calc) (ref 0.2–1.2)
Total Bilirubin: 0.7 mg/dL (ref 0.2–1.2)
Total Protein: 8.1 g/dL (ref 6.1–8.1)

## 2017-09-23 LAB — LIPID PANEL
Cholesterol: 175 mg/dL (ref <200)
HDL: 64 mg/dL (ref >50)
LDL Cholesterol (Calc): 92 mg/dL (calc)
Non-HDL Cholesterol (Calc): 111 mg/dL (calc) (ref <130)
Total CHOL/HDL Ratio: 2.7 (calc) (ref <5.0)
Triglycerides: 98 mg/dL (ref <150)

## 2017-09-23 LAB — MAGNESIUM: Magnesium: 1.9 mg/dL (ref 1.5–2.5)

## 2017-09-23 LAB — VITAMIN D 25 HYDROXY (VIT D DEFICIENCY, FRACTURES): Vit D, 25-Hydroxy: 93 ng/mL (ref 30–100)

## 2017-10-06 ENCOUNTER — Other Ambulatory Visit: Payer: Self-pay | Admitting: Physician Assistant

## 2017-10-12 ENCOUNTER — Other Ambulatory Visit: Payer: Self-pay | Admitting: Internal Medicine

## 2017-10-12 ENCOUNTER — Other Ambulatory Visit: Payer: Self-pay | Admitting: Physician Assistant

## 2017-10-24 ENCOUNTER — Other Ambulatory Visit: Payer: Self-pay | Admitting: Physician Assistant

## 2017-11-01 ENCOUNTER — Other Ambulatory Visit: Payer: Self-pay | Admitting: Internal Medicine

## 2017-11-01 MED ORDER — TRAMADOL HCL 50 MG PO TABS: ORAL_TABLET | ORAL | 0 refills | Status: DC

## 2017-11-11 ENCOUNTER — Encounter: Payer: Self-pay | Admitting: Internal Medicine

## 2017-11-11 ENCOUNTER — Ambulatory Visit (INDEPENDENT_AMBULATORY_CARE_PROVIDER_SITE_OTHER): Payer: Medicare Other | Admitting: Internal Medicine

## 2017-11-11 VITALS — BP 136/74 | HR 76 | Temp 97.3°F | Resp 16 | Ht 60.0 in | Wt 178.0 lb

## 2017-11-11 DIAGNOSIS — Z136 Encounter for screening for cardiovascular disorders: Secondary | ICD-10-CM

## 2017-11-11 DIAGNOSIS — K219 Gastro-esophageal reflux disease without esophagitis: Secondary | ICD-10-CM

## 2017-11-11 DIAGNOSIS — I10 Essential (primary) hypertension: Secondary | ICD-10-CM | POA: Diagnosis not present

## 2017-11-11 DIAGNOSIS — Z79899 Other long term (current) drug therapy: Secondary | ICD-10-CM

## 2017-11-11 DIAGNOSIS — R7303 Prediabetes: Secondary | ICD-10-CM | POA: Diagnosis not present

## 2017-11-11 DIAGNOSIS — E559 Vitamin D deficiency, unspecified: Secondary | ICD-10-CM

## 2017-11-11 DIAGNOSIS — Z1212 Encounter for screening for malignant neoplasm of rectum: Secondary | ICD-10-CM

## 2017-11-11 DIAGNOSIS — Z0001 Encounter for general adult medical examination with abnormal findings: Secondary | ICD-10-CM

## 2017-11-11 DIAGNOSIS — E782 Mixed hyperlipidemia: Secondary | ICD-10-CM | POA: Diagnosis not present

## 2017-11-11 DIAGNOSIS — E038 Other specified hypothyroidism: Secondary | ICD-10-CM | POA: Diagnosis not present

## 2017-11-11 DIAGNOSIS — R7309 Other abnormal glucose: Secondary | ICD-10-CM | POA: Diagnosis not present

## 2017-11-11 DIAGNOSIS — F325 Major depressive disorder, single episode, in full remission: Secondary | ICD-10-CM

## 2017-11-11 DIAGNOSIS — Z1211 Encounter for screening for malignant neoplasm of colon: Secondary | ICD-10-CM

## 2017-11-11 DIAGNOSIS — Z0000 Encounter for general adult medical examination without abnormal findings: Secondary | ICD-10-CM

## 2017-11-11 DIAGNOSIS — I700 Atherosclerosis of aorta: Secondary | ICD-10-CM

## 2017-11-11 NOTE — Patient Instructions (Signed)

## 2017-11-11 NOTE — Progress Notes (Signed)
[facility] Unk Pinto, M.D.     Alyssa Schmitt. Alyssa Schmitt, P.A.-C Alyssa Schmitt, Alyssa Schmitt [address] [place] [address] Telephone [phone] Telefax [phone] Annual Screening/Preventative Visit & Comprehensive Evaluation &  Examination     This very nice 83 y.o. [name] presents for a Screening/Preventative Visit & comprehensive evaluation and management of multiple medical co-morbidities.  Patient has been followed for HTN, Prediabetes, Hyperlipidemia and Vitamin D Deficiency. Patient's GERD is controlled on her meds.       HTN predates since 22. Patient's BP has been controlled at home and patient denies any cardiac symptoms as chest pain, palpitations, shortness of breath, dizziness or ankle swelling. Today's BP is at goal -  136/74.      Patient's hyperlipidemia is controlled with diet and medications. Patient denies myalgias or other medication SE's. Last lipids were at goal:  Lab Results  Component Value Date   CHOL 175 09/22/2017   HDL 64 09/22/2017   LDLCALC 89 04/13/2017   TRIG 98 09/22/2017   CHOLHDL 2.7 09/22/2017      Patient has Morbid Obesity (BMI 34+) and consequent prediabetes (A1c 5.7% /2014)     and patient denies reactive hypoglycemic symptoms, visual blurring, diabetic polys, or paresthesias. Last A1c was  normal and at goal: Lab Results  Component Value Date   HGBA1C 5.4 09/22/2017      Patient has been on Thyroid Replacement since 2011.  Finally, patient has history of Vitamin D Deficiency and last Vitamin D was at goal: Lab Results  Component Value Date   VD25OH 93 09/22/2017   Current Outpatient Medications on File Prior to Visit  Medication Sig  . acetaminophen (TYLENOL) 325 MG tablet Take 2 tablets (650 mg total) by mouth every 6 (six) hours as needed (prn TEMP>38.1 Celsius).  [name] Kitchen azelastine (ASTELIN) 0.1 % nasal spray Place 2 sprays into both nostrils 2 (two) times  daily. Use in each nostril as directed  . busPIRone (BUSPAR) 10 MG tablet TAKE 1/2 - 1 TABLET 3 TIMES A DAY AS NEEDED FOR ANXIETY  . calcium-vitamin D (OSCAL WITH D) 250-125 MG-UNIT per tablet Take 1 tablet by mouth daily.  . Cholecalciferol (VITAMIN D3) 2000 UNITS TABS Take 4 tablets by mouth daily.   . citalopram (CELEXA) 20 MG tablet TAKE 1/2-1 TABLET BY MOUTH DAILY  . fexofenadine (ALLEGRA) 180 MG tablet Take 180 mg by mouth daily.   . hydrochlorothiazide (HYDRODIURIL) 25 MG tablet TAKE 1 TABLET BY MOUTH EVERY DAY  . levothyroxine (SYNTHROID, LEVOTHROID) 50 MCG tablet TAKE 1 TABLET BY MOUTH DAILY  . magnesium oxide (MAG-OX) 400 (241.3 MG) MG tablet Take 1 tablet (400 mg total) by mouth 2 (two) times daily.  . montelukast (SINGULAIR) 10 MG tablet Take 1 tablet (10 mg total) by mouth daily.  . Multiple Vitamin (MULITIVITAMIN WITH MINERALS) TABS Take 1 tablet by mouth daily.  [name] Kitchen omeprazole (PRILOSEC) 40 MG capsule TAKE 1 CAPSULE DAILY  . Probiotic Product (PROBIOTIC FORMULA PO) Take 1 tablet by mouth daily.   [name] Kitchen terazosin (HYTRIN) 10 MG capsule TAKE 1 CAPSULE (10 MG TOTAL) BY MOUTH DAILY.   No current facility-administered medications on file prior to visit.    Allergies  Allergen Reactions  . Aspirin Swelling  . Celebrex [Celecoxib] Other (See Comments)    "my body lit up"  . Fosamax [Alendronate Sodium] Other (See Comments)    unknown  . Keflex [Cephalexin] Other (See Comments)  unknown  . Penicillins Other (See Comments)    unknown  . Prednisone Itching  . Prevacid [Lansoprazole] Other (See Comments)    unknown  . Sulfa Antibiotics Other (See Comments)    unknown  . Tetanus Toxoids Other (See Comments)    unknown  . Tetracyclines & Related Nausea Only    Nausea    Past Medical History:  Diagnosis Date  . COPD (chronic obstructive pulmonary disease) ([place])   . GERD (gastroesophageal reflux disease)   . Hyperlipidemia   . Hypertension   . Osteoporosis   . Prediabetes    . Vitamin D deficiency    Health Maintenance  Topic Date Due  . MAMMOGRAM  07/16/2018  . INFLUENZA VACCINE  Completed  . DEXA SCAN  Completed  . PNA vac Low Risk Adult  Completed   Immunization History  Administered Date(s) Administered  . Influenza, High Dose Seasonal PF 09/05/2014, 09/17/2015, 08/25/2016, 09/22/2017  . Pneumococcal Conjugate-13 11/20/2015  . Pneumococcal Polysaccharide-23 12/18/2007  . Zoster 07/25/2013   Past Surgical History:  Procedure Laterality Date  . TONSILLECTOMY     childhood  . VESICOVAGINAL FISTULA CLOSURE W/ TAH     Family History  Problem Relation Age of Onset  . Heart disease Mother   . Other Mother        VARICOSE VEINS  . Diabetes Father   . Other Brother        VARICOSE VEINS   Social History   Tobacco Use  . Smoking status: Never Smoker  . Smokeless tobacco: Never Used  Substance Use Topics  . Alcohol use: No  . Drug use: No    ROS Constitutional: Denies fever, chills, weight loss/gain, headaches, insomnia,  night sweats, and change in appetite. Does c/o fatigue. Eyes: Denies redness, blurred vision, diplopia, discharge, itchy, watery eyes.  ENT: Denies discharge, congestion, post nasal drip, epistaxis, sore throat, earache, hearing loss, dental pain, Tinnitus, Vertigo, Sinus pain, snoring.  Cardio: Denies chest pain, palpitations, irregular heartbeat, syncope, dyspnea, diaphoresis, orthopnea, PND, claudication, edema Respiratory: denies cough, dyspnea, DOE, pleurisy, hoarseness, laryngitis, wheezing.  Gastrointestinal: Denies dysphagia, heartburn, reflux, water brash, pain, cramps, nausea, vomiting, bloating, diarrhea, constipation, hematemesis, melena, hematochezia, jaundice, hemorrhoids Genitourinary: Denies dysuria, frequency, urgency, nocturia, hesitancy, discharge, hematuria, flank pain Breast: Breast lumps, nipple discharge, bleeding.  Musculoskeletal: Denies arthralgia, myalgia, stiffness, Jt. Swelling, pain, limp, and  strain/sprain. Denies falls. Skin: Denies puritis, rash, hives, warts, acne, eczema, changing in skin lesion Neuro: No weakness, tremor, incoordination, spasms, paresthesia, pain Psychiatric: Denies confusion, memory loss, sensory loss. Denies Depression. Endocrine: Denies change in weight, skin, hair change, nocturia, and paresthesia, diabetic polys, visual blurring, hyper / hypo glycemic episodes.  Heme/Lymph: No excessive bleeding, bruising, enlarged lymph nodes.  Physical Exam  BP 136/74   Pulse 76   Temp (!) 97.3 F (36.3 C)   Resp 16   Ht 5' (1.524 m)   Wt 178 lb (80.7 kg)   BMI 34.76 kg/m   General Appearance: Over nourished, well groomed and in no apparent distress.  Eyes: PERRLA, EOMs, conjunctiva no swelling or erythema, normal fundi and vessels. Sinuses: No frontal/maxillary tenderness ENT/Mouth: EACs patent / TMs  nl. Nares clear without erythema, swelling, mucoid exudates. Oral hygiene is good. No erythema, swelling, or exudate. Tongue normal, non-obstructing. Tonsils not swollen or erythematous. Hearing normal.  Neck: Supple, thyroid normal. No bruits, nodes or JVD. Respiratory: Respiratory effort normal.  BS equal and clear bilateral without rales, rhonci, wheezing or stridor. Cardio: Heart sounds  are normal with regular rate and rhythm and no murmurs, rubs or gallops. Peripheral pulses are normal and equal bilaterally without edema. No aortic or femoral bruits. Chest: Kyphotic. Breasts: Symmetric, pendulous without lumps, nipple discharge, retractions, or fibrocystic changes.  Abdomen: Flat, soft with bowel sounds active. Nontender, no guarding, rebound, hernias, masses, or organomegaly.  Lymphatics: Non tender without lymphadenopathy.  Musculoskeletal: Full ROM all peripheral extremities, joint stability, 5/5 strength, and normal gait. Skin: Warm and dry without rashes, lesions, cyanosis, clubbing or  ecchymosis.  Neuro: Cranial nerves intact, reflexes equal  bilaterally. Normal muscle tone, no cerebellar symptoms. Sensation intact.  Pysch: Alert and oriented X 3, normal affect, Insight and Judgment appropriate.   Assessment and Plan  1. Annual Preventative Screening Examination  2. Essential hypertension  - EKG 12-Lead - Urinalysis, Routine w reflex microscopic - Microalbumin / creatinine urine ratio - CBC with Differential/Platelet - BASIC METABOLIC PANEL WITH GFR - Magnesium - TSH  3. Hyperlipidemia, mixed  - EKG 12-Lead - Hepatic function panel - Lipid panel - TSH  4. Prediabetes  - EKG 12-Lead - Hemoglobin A1c - Insulin, random  5. Vitamin D deficiency  - VITAMIN D 25 Hydroxy   6. Abnormal glucose  - Hemoglobin A1c - Insulin, random  7. Aortic atherosclerosis (HCC)  - EKG 12-Lead  8. Gastroesophageal reflux disease   9. Hypothyroidism  - TSH  10. Screening for ischemic heart disease  - EKG 12-Lead  11. Screening for colorectal cancer  - POC Hemoccult Bld/Stl   12. Depression, major, in remission ([place])   13. Medication management  - Urinalysis, Routine w reflex microscopic - Microalbumin / creatinine urine ratio - CBC with Differential/Platelet - BASIC METABOLIC PANEL WITH GFR - Hepatic function panel - Magnesium - Lipid panel - TSH - Hemoglobin A1c - Insulin, random - VITAMIN D 25 Hydroxy        Patient was counseled in prudent diet to achieve/maintain BMI less than 25 for weight control, BP monitoring, regular exercise and medications. Discussed med's effects and SE's. Screening labs and tests as requested with regular follow-up as recommended. Over 40 minutes of exam, counseling, chart review and high complex critical decision making was performed.

## 2017-11-12 LAB — CBC WITH DIFFERENTIAL/PLATELET
Basophils Absolute: 41 cells/uL (ref 0–200)
Basophils Relative: 0.9 %
Eosinophils Absolute: 32 cells/uL (ref 15–500)
Eosinophils Relative: 0.7 %
HCT: 42.2 % (ref 35.0–45.0)
Hemoglobin: 14.2 g/dL (ref 11.7–15.5)
Lymphs Abs: 1499 cells/uL (ref 850–3900)
MCH: 29.8 pg (ref 27.0–33.0)
MCHC: 33.6 g/dL (ref 32.0–36.0)
MCV: 88.7 fL (ref 80.0–100.0)
MPV: 11.2 fL (ref 7.5–12.5)
Monocytes Relative: 9.3 %
Neutro Abs: 2511 cells/uL (ref 1500–7800)
Neutrophils Relative %: 55.8 %
Platelets: 179 10*3/uL (ref 140–400)
RBC: 4.76 10*6/uL (ref 3.80–5.10)
RDW: 12.3 % (ref 11.0–15.0)
Total Lymphocyte: 33.3 %
WBC mixed population: 419 cells/uL (ref 200–950)
WBC: 4.5 10*3/uL (ref 3.8–10.8)

## 2017-11-12 LAB — MICROALBUMIN / CREATININE URINE RATIO
Creatinine, Urine: 118 mg/dL (ref 20–275)
Microalb Creat Ratio: 5 mcg/mg creat (ref <30)
Microalb, Ur: 0.6 mg/dL

## 2017-11-12 LAB — BASIC METABOLIC PANEL WITH GFR
BUN/Creatinine Ratio: 28 (calc) — ABNORMAL HIGH (ref 6–22)
BUN: 28 mg/dL — ABNORMAL HIGH (ref 7–25)
CO2: 28 mmol/L (ref 20–32)
Calcium: 10.3 mg/dL (ref 8.6–10.4)
Chloride: 97 mmol/L — ABNORMAL LOW (ref 98–110)
Creat: 0.99 mg/dL — ABNORMAL HIGH (ref 0.60–0.88)
GFR, Est African American: 59 mL/min/{1.73_m2} — ABNORMAL LOW (ref >=60)
GFR, Est Non African American: 51 mL/min/{1.73_m2} — ABNORMAL LOW (ref >=60)
Glucose, Bld: 100 mg/dL — ABNORMAL HIGH (ref 65–99)
Potassium: 3.6 mmol/L (ref 3.5–5.3)
Sodium: 136 mmol/L (ref 135–146)

## 2017-11-12 LAB — TSH: TSH: 1.84 mIU/L (ref 0.40–4.50)

## 2017-11-12 LAB — URINALYSIS, ROUTINE W REFLEX MICROSCOPIC
Bilirubin Urine: NEGATIVE
Glucose, UA: NEGATIVE
Hgb urine dipstick: NEGATIVE
Ketones, ur: NEGATIVE
Leukocytes, UA: NEGATIVE
Nitrite: NEGATIVE
Protein, ur: NEGATIVE
Specific Gravity, Urine: 1.02 (ref 1.001–1.03)
pH: 6.5 (ref 5.0–8.0)

## 2017-11-12 LAB — HEPATIC FUNCTION PANEL
AG Ratio: 1.2 (calc) (ref 1.0–2.5)
ALT: 35 U/L — ABNORMAL HIGH (ref 6–29)
AST: 41 U/L — ABNORMAL HIGH (ref 10–35)
Albumin: 4.4 g/dL (ref 3.6–5.1)
Alkaline phosphatase (APISO): 47 U/L (ref 33–130)
Bilirubin, Direct: 0.2 mg/dL (ref 0.0–0.2)
Globulin: 3.7 g/dL (calc) (ref 1.9–3.7)
Indirect Bilirubin: 0.6 mg/dL (calc) (ref 0.2–1.2)
Total Bilirubin: 0.8 mg/dL (ref 0.2–1.2)
Total Protein: 8.1 g/dL (ref 6.1–8.1)

## 2017-11-12 LAB — HEMOGLOBIN A1C
Hgb A1c MFr Bld: 5.4 % of total Hgb (ref <5.7)
Mean Plasma Glucose: 108 (calc)
eAG (mmol/L): 6 (calc)

## 2017-11-12 LAB — LIPID PANEL
Cholesterol: 170 mg/dL (ref <200)
HDL: 67 mg/dL (ref >50)
LDL Cholesterol (Calc): 87 mg/dL (calc)
Non-HDL Cholesterol (Calc): 103 mg/dL (calc) (ref <130)
Total CHOL/HDL Ratio: 2.5 (calc) (ref <5.0)
Triglycerides: 71 mg/dL (ref <150)

## 2017-11-12 LAB — VITAMIN D 25 HYDROXY (VIT D DEFICIENCY, FRACTURES): Vit D, 25-Hydroxy: 90 ng/mL (ref 30–100)

## 2017-11-12 LAB — INSULIN, RANDOM: Insulin: 14.5 u[IU]/mL (ref 2.0–19.6)

## 2017-11-12 LAB — MAGNESIUM: Magnesium: 2 mg/dL (ref 1.5–2.5)

## 2017-11-26 ENCOUNTER — Other Ambulatory Visit: Payer: Self-pay | Admitting: Internal Medicine

## 2017-11-26 MED ORDER — BUSPIRONE HCL 10 MG PO TABS: ORAL_TABLET | ORAL | 1 refills | Status: DC

## 2017-12-02 DIAGNOSIS — L603 Nail dystrophy: Secondary | ICD-10-CM | POA: Diagnosis not present

## 2017-12-02 DIAGNOSIS — I739 Peripheral vascular disease, unspecified: Secondary | ICD-10-CM | POA: Diagnosis not present

## 2017-12-08 ENCOUNTER — Other Ambulatory Visit: Payer: Self-pay | Admitting: *Deleted

## 2017-12-08 MED ORDER — HYDROCHLOROTHIAZIDE 25 MG PO TABS: 25.0000 mg | ORAL_TABLET | Freq: Every day | ORAL | 1 refills | Status: DC

## 2017-12-20 ENCOUNTER — Other Ambulatory Visit: Payer: Self-pay | Admitting: Internal Medicine

## 2017-12-20 MED ORDER — TRAMADOL HCL 50 MG PO TABS: ORAL_TABLET | ORAL | 0 refills | Status: DC

## 2018-01-19 ENCOUNTER — Other Ambulatory Visit: Payer: Self-pay

## 2018-01-19 DIAGNOSIS — Z1211 Encounter for screening for malignant neoplasm of colon: Secondary | ICD-10-CM

## 2018-01-19 DIAGNOSIS — Z1212 Encounter for screening for malignant neoplasm of rectum: Principal | ICD-10-CM

## 2018-01-19 LAB — POC HEMOCCULT BLD/STL (HOME/3-CARD/SCREEN)
Card #2 Fecal Occult Blod, POC: NEGATIVE
Card #3 Fecal Occult Blood, POC: NEGATIVE
Fecal Occult Blood, POC: NEGATIVE

## 2018-01-20 ENCOUNTER — Other Ambulatory Visit: Payer: Self-pay | Admitting: Internal Medicine

## 2018-01-20 DIAGNOSIS — Z1212 Encounter for screening for malignant neoplasm of rectum: Secondary | ICD-10-CM | POA: Diagnosis not present

## 2018-02-02 DIAGNOSIS — H40013 Open angle with borderline findings, low risk, bilateral: Secondary | ICD-10-CM | POA: Diagnosis not present

## 2018-02-02 DIAGNOSIS — H40033 Anatomical narrow angle, bilateral: Secondary | ICD-10-CM | POA: Diagnosis not present

## 2018-02-15 NOTE — Progress Notes (Signed)
FOLLOW UP  Assessment and Plan:   Hypertension Well controlled with current medications  Monitor blood pressure at home; patient to call if consistently greater than 130/80 Continue DASH diet.   Reminder to go to the ER if any CP, SOB, nausea, dizziness, severe HA, changes vision/speech, left arm numbness and tingling and jaw pain.  Cholesterol Currently at goal by lifestyle modification -  Continue low cholesterol diet and exercise.  Defer lipid panel   Other abnormal glucose Recent A1Cs at goal Discussed diet/exercise, weight management  Defer A1C; check BMP  Obesity with co morbidities Long discussion about weight loss, diet, and exercise Recommended diet heavy in fruits and veggies and low in animal meats, cheeses, and dairy products, appropriate calorie intake Discussed ideal weight for height  Patient will work on increasing exercise, portion control Will follow up in 3 months  Hypothyroidism continue medications the same reminded to take on an empty stomach 30-69mins before food.  check TSH level  Vitamin D Def At goal at last visit; continue supplementation to maintain goal of 70-100 Defer Vit D level  GERD Well managed on current medications Declines switch to H2 inhibitor from PPI despite discussion of risks associated with chronic PPI use Advised to take supplements in the evening, at least 4 hours apart from PPI Discussed diet, avoiding triggers and other lifestyle changes   Continue diet and meds as discussed. Further disposition pending results of labs. Discussed med's effects and SE's.   Over 30 minutes of exam, counseling, chart review, and critical decision making was performed.   Future Appointments  Date Time Provider [place]  05/25/2018  2:30 PM Unk Pinto, MD [facility] None  11/23/2018  3:45 PM Unk Pinto, MD [facility] None     ----------------------------------------------------------------------------------------------------------------------  HPI 84 y.o. female  presents for 3 month follow up on hypertension, cholesterol, glucose management, weight, GERD and vitamin D deficiency.   she has a diagnosis of GERD which is currently managed by prilosec 40 mg. Discussed risks regarding chronic use and association with dementia; patient feels she is well controlled and declines switch to H2 inhibitor.  she reports symptoms is currently well controlled, and denies breakthrough reflux, burning in chest, hoarseness or cough.    BMI is Body mass index is 35.74 kg/m., she has been working on diet and exercise (as able with walker and chair exercises).  Wt Readings from Last 3 Encounters:  02/16/18 183 lb (83 kg)  11/11/17 178 lb (80.7 kg)  09/22/17 177 lb (80.3 kg)   Her blood pressure has been controlled at home, today their BP is BP: 132/76  She does workout. She denies chest pain, shortness of breath, dizziness.   She is not on cholesterol medication and denies myalgias. Her cholesterol is at goal. The cholesterol last visit was:   Lab Results  Component Value Date   CHOL 170 11/11/2017   HDL 67 11/11/2017   LDLCALC 87 11/11/2017   TRIG 71 11/11/2017   CHOLHDL 2.5 11/11/2017    She has been working on diet and exercise for glucose management (hx of prediabetes), and denies foot ulcerations, increased appetite, nausea, paresthesia of the feet, polydipsia, polyuria, visual disturbances, vomiting and weight loss. Last A1C in the office was:  Lab Results  Component Value Date   HGBA1C 5.4 11/11/2017    She is on thyroid medication. Her medication was not changed last visit.   Lab Results  Component Value Date   TSH 1.84 11/11/2017   Patient is on  Vitamin D supplement and at goal at recent check:   Lab Results  Component Value Date   VD25OH 90 11/11/2017        Current Medications:  Current  Outpatient Medications on File Prior to Visit  Medication Sig  . acetaminophen (TYLENOL) 325 MG tablet Take 2 tablets (650 mg total) by mouth every 6 (six) hours as needed (prn TEMP>38.1 Celsius).  . busPIRone (BUSPAR) 10 MG tablet TAKE 1/2 - 1 TABLET 3 TIMES A DAY AS NEEDED FOR ANXIETY  . calcium-vitamin D (OSCAL WITH D) 250-125 MG-UNIT per tablet Take 1 tablet by mouth daily.  . Cholecalciferol (VITAMIN D3) 2000 UNITS TABS Take 4 tablets by mouth daily.   . citalopram (CELEXA) 20 MG tablet TAKE 1/2-1 TABLET BY MOUTH DAILY  . fexofenadine (ALLEGRA) 180 MG tablet Take 180 mg by mouth daily.   . hydrochlorothiazide (HYDRODIURIL) 25 MG tablet Take 1 tablet (25 mg total) by mouth daily.  [name] Kitchen levothyroxine (SYNTHROID, LEVOTHROID) 50 MCG tablet TAKE 1 TABLET BY MOUTH DAILY  . magnesium oxide (MAG-OX) 400 (241.3 MG) MG tablet Take 1 tablet (400 mg total) by mouth 2 (two) times daily.  . Multiple Vitamin (MULITIVITAMIN WITH MINERALS) TABS Take 1 tablet by mouth daily.  [name] Kitchen omeprazole (PRILOSEC) 40 MG capsule TAKE 1 CAPSULE DAILY  . Probiotic Product (PROBIOTIC FORMULA PO) Take 1 tablet by mouth daily.   [name] Kitchen terazosin (HYTRIN) 10 MG capsule TAKE 1 CAPSULE (10 MG TOTAL) BY MOUTH DAILY.  . traMADol (ULTRAM) 50 MG tablet PLEASE SEE ATTACHED FOR DETAILED DIRECTIONS  . montelukast (SINGULAIR) 10 MG tablet Take 1 tablet (10 mg total) by mouth daily.   No current facility-administered medications on file prior to visit.      Allergies:  Allergies  Allergen Reactions  . Aspirin Swelling  . Celebrex [Celecoxib] Other (See Comments)    "my body lit up"  . Fosamax [Alendronate Sodium] Other (See Comments)    unknown  . Keflex [Cephalexin] Other (See Comments)    unknown  . Penicillins Other (See Comments)    unknown  . Prednisone Itching  . Prevacid [Lansoprazole] Other (See Comments)    unknown  . Sulfa Antibiotics Other (See Comments)    unknown  . Tetanus Toxoids Other (See Comments)    unknown  .  Tetracyclines & Related Nausea Only    Nausea      Medical History:  Past Medical History:  Diagnosis Date  . COPD (chronic obstructive pulmonary disease) ([place])   . GERD (gastroesophageal reflux disease)   . Hyperlipidemia   . Hypertension   . Osteoporosis   . Prediabetes   . Vitamin D deficiency    Family history- Reviewed and unchanged Social history- Reviewed and unchanged   Review of Systems:  Review of Systems  Constitutional: Negative for malaise/fatigue and weight loss.  HENT: Negative for hearing loss and tinnitus.   Eyes: Negative for blurred vision and double vision.  Respiratory: Negative for cough, shortness of breath and wheezing.   Cardiovascular: Negative for chest pain, palpitations, orthopnea, claudication and leg swelling.  Gastrointestinal: Negative for abdominal pain, blood in stool, constipation, diarrhea, heartburn, melena, nausea and vomiting.  Genitourinary: Negative.   Musculoskeletal: Negative for joint pain and myalgias.  Skin: Negative for rash.  Neurological: Negative for dizziness, tingling, sensory change, weakness and headaches.  Endo/Heme/Allergies: Negative for polydipsia.  Psychiatric/Behavioral: Negative.   All other systems reviewed and are negative.     Physical Exam: BP 132/76   Pulse 77  Temp 97.9 F (36.6 C)   Ht 5' (1.524 m)   Wt 183 lb (83 kg)   SpO2 96%   BMI 35.74 kg/m  Wt Readings from Last 3 Encounters:  02/16/18 183 lb (83 kg)  11/11/17 178 lb (80.7 kg)  09/22/17 177 lb (80.3 kg)   General Appearance: Well nourished, elderly female in no apparent distress. Eyes: PERRLA, EOMs, conjunctiva no swelling or erythema Sinuses: No Frontal/maxillary tenderness ENT/Mouth: Ext aud canals clear, TMs without erythema, bulging. No erythema, swelling, or exudate on post pharynx.  Tonsils not swollen or erythematous. Hearing normal.  Neck: Supple, thyroid normal.  Respiratory: Respiratory effort normal, BS equal bilaterally  without rales, rhonchi, wheezing or stridor.  Cardio: RRR with no MRGs. Brisk peripheral pulses without edema.  Abdomen: Soft, + BS.  Non tender, no guarding, rebound, hernias, masses. Lymphatics: Non tender without lymphadenopathy.  Musculoskeletal:  Kyphotic, no other obvious deformity symmetrical strength, Ambulates slowly with walker gait Skin: Warm, dry without rashes, lesions, ecchymosis.  Neuro: Cranial nerves intact. No cerebellar symptoms.  Psych: Awake and oriented X 3, normal affect, Insight and Judgment appropriate.    Izora Ribas, NP 2:45 PM [facility]

## 2018-02-16 ENCOUNTER — Encounter: Payer: Self-pay | Admitting: Adult Health

## 2018-02-16 ENCOUNTER — Ambulatory Visit (INDEPENDENT_AMBULATORY_CARE_PROVIDER_SITE_OTHER): Payer: Medicare Other | Admitting: Adult Health

## 2018-02-16 VITALS — BP 132/76 | HR 77 | Temp 97.9°F | Ht 60.0 in | Wt 183.0 lb

## 2018-02-16 DIAGNOSIS — E038 Other specified hypothyroidism: Secondary | ICD-10-CM | POA: Diagnosis not present

## 2018-02-16 DIAGNOSIS — E669 Obesity, unspecified: Secondary | ICD-10-CM | POA: Diagnosis not present

## 2018-02-16 DIAGNOSIS — I10 Essential (primary) hypertension: Secondary | ICD-10-CM

## 2018-02-16 DIAGNOSIS — R7309 Other abnormal glucose: Secondary | ICD-10-CM

## 2018-02-16 DIAGNOSIS — E559 Vitamin D deficiency, unspecified: Secondary | ICD-10-CM | POA: Diagnosis not present

## 2018-02-16 DIAGNOSIS — Z79899 Other long term (current) drug therapy: Secondary | ICD-10-CM

## 2018-02-16 DIAGNOSIS — K219 Gastro-esophageal reflux disease without esophagitis: Secondary | ICD-10-CM

## 2018-02-16 NOTE — Patient Instructions (Signed)
Please take your supplements (vitamin D, multivitamin, magnesium, calcium) in the evening instead of the morning - your acid medication (prilosec/omeprazole) may be reducing the absorption of these.      Omeprazole tablets  What is this medicine? OMEPRAZOLE (oh ME pray zol) prevents the production of acid in the stomach. It is used to treat the symptoms of heartburn. You can buy this medicine without a prescription. This product is not for long-term use, unless otherwise directed by your doctor or health care professional. This medicine may be used for other purposes; ask your health care provider or pharmacist if you have questions. COMMON BRAND NAME(S): Prilosec OTC What should I tell my health care provider before I take this medicine? They need to know if you have any of these conditions: -black or bloody stools -chest pain -difficulty swallowing -have had heartburn for over 3 months -have heartburn with dizziness, lightheadedness or sweating -liver disease -lupus -stomach pain -unexplained weight loss -vomiting with blood -wheezing -an unusual or allergic reaction to omeprazole, other medicines, foods, dyes, or preservatives -pregnant or trying to get pregnant -breast-feeding How should I use this medicine? Take this medicine by mouth. Follow the directions on the product label. If you are taking this medicine without a prescription, take one tablet every day. Do not use for longer than 14 days or repeat a course of treatment more often than every 4 months unless directed by a doctor or healthcare professional. Take your dose at regular intervals every 24 hours. Swallow the tablet whole with a drink of water. Do not crush, break or chew. This medicine works best if taken on an empty stomach 30 minutes before breakfast. If you are using this medicine with the prescription of your doctor or healthcare professional, follow the directions you were given. Do not take your medicine more  often than directed. Talk to your pediatrician regarding the use of this medicine in children. Special care may be needed. Overdosage: If you think you have taken too much of this medicine contact a poison control center or emergency room at once. NOTE: This medicine is only for you. Do not share this medicine with others. What if I miss a dose? If you miss a dose, take it as soon as you can. If it is almost time for your next dose, take only that dose. Do not take double or extra doses. What may interact with this medicine? Do not take this medicine with any of the following medications: -atazanavir -clopidogrel -nelfinavir This medicine may also interact with the following medications: -ampicillin -certain medicines for anxiety or sleep -certain medicines that treat or prevent blood clots like warfarin -cyclosporine -diazepam -digoxin -disulfiram -iron salts -methotrexate -mycophenolate mofetil -phenytoin -prescription medicine for fungal or yeast infection like itraconazole, ketoconazole, voriconazole -saquinavir -tacrolimus This list may not describe all possible interactions. Give your health care provider a list of all the medicines, herbs, non-prescription drugs, or dietary supplements you use. Also tell them if you smoke, drink alcohol, or use illegal drugs. Some items may interact with your medicine. What should I watch for while using this medicine? It can take several days before your heartburn gets better. Check with your doctor or health care professional if your condition does not start to get better, or if it gets worse. Do not treat diarrhea with over the counter products. Contact your doctor if you have diarrhea that lasts more than 2 days or if it is severe and watery. Do not treat yourself for heartburn  with this medicine for more than 14 days in a row. You should only use this medicine for a 2-week treatment period once every 4 months. If your symptoms return shortly  after your therapy is complete, or within the 4 month time frame, call your doctor or health care professional. What side effects may I notice from receiving this medicine? Side effects that you should report to your doctor or health care professional as soon as possible: -allergic reactions like skin rash, itching or hives, swelling of the face, lips, or tongue -bone, muscle or joint pain -breathing problems -chest pain or chest tightness -dark yellow or brown urine -diarrhea -dizziness -fast, irregular heartbeat -feeling faint or lightheaded -fever or sore throat -muscle spasm -palpitations -rash on cheeks or arms that gets worse in the sun -redness, blistering, peeling or loosening of the skin, including inside the mouth -seizures -tremors -unusual bleeding or bruising -unusually weak or tired -yellowing of the eyes or skin Side effects that usually do not require medical attention (report to your doctor or health care professional if they continue or are bothersome): -constipation -dry mouth -headache -loose stools -nausea This list may not describe all possible side effects. Call your doctor for medical advice about side effects. You may report side effects to FDA at 1-800-FDA-1088. Where should I keep my medicine? Keep out of the reach of children. Store at room temperature between 20 and 25 degrees C (68 and 77 degrees F). Protect from light and moisture. Throw away any unused medicine after the expiration date. NOTE: This sheet is a summary. It may not cover all possible information. If you have questions about this medicine, talk to your doctor, pharmacist, or health care provider.  2018 Elsevier/Gold Standard (2015-12-26 13:06:31)

## 2018-02-17 LAB — BASIC METABOLIC PANEL WITH GFR
BUN/Creatinine Ratio: 20 (calc) (ref 6–22)
BUN: 23 mg/dL (ref 7–25)
CO2: 29 mmol/L (ref 20–32)
Calcium: 9.6 mg/dL (ref 8.6–10.4)
Chloride: 97 mmol/L — ABNORMAL LOW (ref 98–110)
Creat: 1.14 mg/dL — ABNORMAL HIGH (ref 0.60–0.88)
GFR, Est African American: 50 mL/min/{1.73_m2} — ABNORMAL LOW (ref >=60)
GFR, Est Non African American: 43 mL/min/{1.73_m2} — ABNORMAL LOW (ref >=60)
Glucose, Bld: 90 mg/dL (ref 65–99)
Potassium: 3.9 mmol/L (ref 3.5–5.3)
Sodium: 136 mmol/L (ref 135–146)

## 2018-02-17 LAB — CBC WITH DIFFERENTIAL/PLATELET
Basophils Absolute: 49 cells/uL (ref 0–200)
Basophils Relative: 0.9 %
Eosinophils Absolute: 81 cells/uL (ref 15–500)
Eosinophils Relative: 1.5 %
HCT: 39.8 % (ref 35.0–45.0)
Hemoglobin: 13.7 g/dL (ref 11.7–15.5)
Lymphs Abs: 2225 cells/uL (ref 850–3900)
MCH: 30.4 pg (ref 27.0–33.0)
MCHC: 34.4 g/dL (ref 32.0–36.0)
MCV: 88.2 fL (ref 80.0–100.0)
MPV: 11 fL (ref 7.5–12.5)
Monocytes Relative: 9.9 %
Neutro Abs: 2511 cells/uL (ref 1500–7800)
Neutrophils Relative %: 46.5 %
Platelets: 161 10*3/uL (ref 140–400)
RBC: 4.51 10*6/uL (ref 3.80–5.10)
RDW: 12.3 % (ref 11.0–15.0)
Total Lymphocyte: 41.2 %
WBC mixed population: 535 cells/uL (ref 200–950)
WBC: 5.4 10*3/uL (ref 3.8–10.8)

## 2018-02-17 LAB — HEPATIC FUNCTION PANEL
AG Ratio: 1.3 (calc) (ref 1.0–2.5)
ALT: 29 U/L (ref 6–29)
AST: 36 U/L — ABNORMAL HIGH (ref 10–35)
Albumin: 4.1 g/dL (ref 3.6–5.1)
Alkaline phosphatase (APISO): 41 U/L (ref 33–130)
Bilirubin, Direct: 0.1 mg/dL (ref 0.0–0.2)
Globulin: 3.2 g/dL (calc) (ref 1.9–3.7)
Indirect Bilirubin: 0.4 mg/dL (calc) (ref 0.2–1.2)
Total Bilirubin: 0.5 mg/dL (ref 0.2–1.2)
Total Protein: 7.3 g/dL (ref 6.1–8.1)

## 2018-02-17 LAB — TSH: TSH: 4.24 mIU/L (ref 0.40–4.50)

## 2018-02-17 LAB — MAGNESIUM: Magnesium: 1.8 mg/dL (ref 1.5–2.5)

## 2018-02-18 DIAGNOSIS — I739 Peripheral vascular disease, unspecified: Secondary | ICD-10-CM | POA: Diagnosis not present

## 2018-02-18 DIAGNOSIS — L603 Nail dystrophy: Secondary | ICD-10-CM | POA: Diagnosis not present

## 2018-02-22 ENCOUNTER — Other Ambulatory Visit: Payer: Self-pay | Admitting: Internal Medicine

## 2018-03-23 ENCOUNTER — Other Ambulatory Visit: Payer: Self-pay | Admitting: Internal Medicine

## 2018-03-24 ENCOUNTER — Telehealth: Payer: Self-pay | Admitting: *Deleted

## 2018-03-24 MED ORDER — GABAPENTIN 100 MG PO CAPS: 100.0000 mg | ORAL_CAPSULE | Freq: Three times a day (TID) | ORAL | 0 refills | Status: DC

## 2018-03-24 NOTE — Telephone Encounter (Signed)
Patient called and requested a refill of Tramadol.  Per Dr Melford Aase, he can not continue to give the medication for chronic pain, since it is a controlled substance. The patient was offered a referral to pain management, which she declined. Per Dr Melford Aase, an RX was offered to the patient for Gabapentin 100 mg, which she was hesitant to try.  After speaking to her daughter, she agreed to a small amount to try and an RX was sent to her pharmacy.

## 2018-04-05 ENCOUNTER — Other Ambulatory Visit: Payer: Self-pay | Admitting: Internal Medicine

## 2018-04-11 ENCOUNTER — Other Ambulatory Visit: Payer: Self-pay | Admitting: Internal Medicine

## 2018-05-18 DIAGNOSIS — I739 Peripheral vascular disease, unspecified: Secondary | ICD-10-CM | POA: Diagnosis not present

## 2018-05-18 DIAGNOSIS — L603 Nail dystrophy: Secondary | ICD-10-CM | POA: Diagnosis not present

## 2018-05-25 ENCOUNTER — Ambulatory Visit (INDEPENDENT_AMBULATORY_CARE_PROVIDER_SITE_OTHER): Payer: Medicare Other | Admitting: Internal Medicine

## 2018-05-25 VITALS — BP 132/78 | HR 84 | Temp 97.9°F | Resp 16 | Ht 60.0 in | Wt 185.2 lb

## 2018-05-25 DIAGNOSIS — E038 Other specified hypothyroidism: Secondary | ICD-10-CM

## 2018-05-25 DIAGNOSIS — E782 Mixed hyperlipidemia: Secondary | ICD-10-CM | POA: Diagnosis not present

## 2018-05-25 DIAGNOSIS — E559 Vitamin D deficiency, unspecified: Secondary | ICD-10-CM | POA: Diagnosis not present

## 2018-05-25 DIAGNOSIS — R7303 Prediabetes: Secondary | ICD-10-CM

## 2018-05-25 DIAGNOSIS — R7309 Other abnormal glucose: Secondary | ICD-10-CM | POA: Diagnosis not present

## 2018-05-25 DIAGNOSIS — Z79899 Other long term (current) drug therapy: Secondary | ICD-10-CM

## 2018-05-25 DIAGNOSIS — I10 Essential (primary) hypertension: Secondary | ICD-10-CM | POA: Diagnosis not present

## 2018-05-25 NOTE — Patient Instructions (Signed)

## 2018-05-25 NOTE — Progress Notes (Signed)
This very nice 84 y.o. WWF presents for 6 month follow up with HTN, HLD, Pre-Diabetes and Vitamin D Deficiency.      Patient is treated for HTN (1990) & BP has been controlled at home. Today's BP is at goal - 132/78. Patient has had no complaints of any cardiac type chest pain, palpitations, dyspnea / orthopnea / PND, dizziness, claudication, or dependent edema.     Hyperlipidemia is controlled with diet & meds. Patient denies myalgias or other med SE's. Last Lipids were at goal: Lab Results  Component Value Date   CHOL 170 11/11/2017   HDL 67 11/11/2017   LDLCALC 87 11/11/2017   TRIG 71 11/11/2017   CHOLHDL 2.5 11/11/2017      Also, the patient has history of Morbid Obesity (BMI 36+) and PreDiabetes (A1c 5.7% /2014) and has had no symptoms of reactive hypoglycemia, diabetic polys, paresthesias or visual blurring.  Last A1c was Normal & at goal: Lab Results  Component Value Date   HGBA1C 5.4 11/11/2017      Patient was started on Thyroid Replacement in 2011.      Further, the patient also has history of Vitamin D Deficiency and supplements vitamin D without any suspected side-effects. Last vitamin D was at goal:  Lab Results  Component Value Date   VD25OH 90 11/11/2017   Current Outpatient Medications on File Prior to Visit  Medication Sig  . acetaminophen (TYLENOL) 325 MG tablet Take 2 tablets (650 mg total) by mouth every 6 (six) hours as needed (prn TEMP>38.1 Celsius).  . busPIRone (BUSPAR) 10 MG tablet TAKE 1/2 - 1 TABLET 3 TIMES A DAY AS NEEDED FOR ANXIETY  . calcium-vitamin D (OSCAL WITH D) 250-125 MG-UNIT per tablet Take 1 tablet by mouth daily.  . Cholecalciferol (VITAMIN D3) 2000 UNITS TABS Take 4 tablets by mouth daily.   . citalopram (CELEXA) 20 MG tablet TAKE 1/2-1 TABLET BY MOUTH DAILY  . fexofenadine (ALLEGRA) 180 MG tablet Take 180 mg by mouth daily.   [name] Kitchen gabapentin (NEURONTIN) 100 MG capsule Take 1 capsule (100 mg total) by mouth 3 (three) times daily.  .  hydrochlorothiazide (HYDRODIURIL) 25 MG tablet Take 1 tablet (25 mg total) by mouth daily.  [name] Kitchen levothyroxine (SYNTHROID, LEVOTHROID) 50 MCG tablet TAKE 1 TABLET BY MOUTH DAILY  . magnesium oxide (MAG-OX) 400 (241.3 MG) MG tablet Take 1 tablet (400 mg total) by mouth 2 (two) times daily.  . Multiple Vitamin (MULITIVITAMIN WITH MINERALS) TABS Take 1 tablet by mouth daily.  [name] Kitchen omeprazole (PRILOSEC) 40 MG capsule TAKE ONE CAPSULE BY MOUTH EVERY DAY  . Probiotic Product (PROBIOTIC FORMULA PO) Take 1 tablet by mouth daily.   [name] Kitchen terazosin (HYTRIN) 10 MG capsule TAKE 1 CAPSULE (10 MG TOTAL) BY MOUTH DAILY.   No current facility-administered medications on file prior to visit.    Allergies  Allergen Reactions  . Aspirin Swelling  . Celebrex [Celecoxib] Other (See Comments)    "my body lit up"  . Fosamax [Alendronate Sodium] Other (See Comments)    unknown  . Keflex [Cephalexin] Other (See Comments)    unknown  . Penicillins Other (See Comments)    unknown  . Prednisone Itching  . Prevacid [Lansoprazole] Other (See Comments)    unknown  . Sulfa Antibiotics Other (See Comments)    unknown  . Tetanus Toxoids Other (See Comments)    unknown  . Tetracyclines & Related Nausea Only    Nausea    PMHx:  Past Medical History:  Diagnosis Date  . COPD (chronic obstructive pulmonary disease) ([place])   . GERD (gastroesophageal reflux disease)   . Hyperlipidemia   . Hypertension   . Osteoporosis   . Prediabetes   . Vitamin D deficiency    Immunization History  Administered Date(s) Administered  . Influenza, High Dose Seasonal PF 09/05/2014, 09/17/2015, 08/25/2016, 09/22/2017  . Pneumococcal Conjugate-13 11/20/2015  . Pneumococcal Polysaccharide-23 12/18/2007  . Zoster 07/25/2013   Past Surgical History:  Procedure Laterality Date  . TONSILLECTOMY     childhood  . VESICOVAGINAL FISTULA CLOSURE W/ TAH     FHx:    Reviewed / unchanged  SHx:    Reviewed / unchanged  Systems  Review:  Constitutional: Denies fever, chills, wt changes, headaches, insomnia, fatigue, night sweats, change in appetite. Eyes: Denies redness, blurred vision, diplopia, discharge, itchy, watery eyes.  ENT: Denies discharge, congestion, post nasal drip, epistaxis, sore throat, earache, hearing loss, dental pain, tinnitus, vertigo, sinus pain, snoring.  CV: Denies chest pain, palpitations, irregular heartbeat, syncope, dyspnea, diaphoresis, orthopnea, PND, claudication or edema. Respiratory: denies cough, dyspnea, DOE, pleurisy, hoarseness, laryngitis, wheezing.  Gastrointestinal: Denies dysphagia, odynophagia, heartburn, reflux, water brash, abdominal pain or cramps, nausea, vomiting, bloating, diarrhea, constipation, hematemesis, melena, hematochezia  or hemorrhoids. Genitourinary: Denies dysuria, frequency, urgency, nocturia, hesitancy, discharge, hematuria or flank pain. Musculoskeletal: Denies arthralgias, myalgias, stiffness, jt. swelling, pain, limping or strain/sprain.  Skin: Denies pruritus, rash, hives, warts, acne, eczema or change in skin lesion(s). Neuro: No weakness, tremor, incoordination, spasms, paresthesia or pain. Psychiatric: Denies confusion, memory loss or sensory loss. Endo: Denies change in weight, skin or hair change.  Heme/Lymph: No excessive bleeding, bruising or enlarged lymph nodes.  Physical Exam  BP 132/78   Pulse 84   Temp 97.9 F (36.6 C)   Resp 16   Ht 5' (1.524 m)   Wt 185 lb 3.2 oz (84 kg)   BMI 36.17 kg/m   Appears  Over nourished, well groomed  and in no distress.  Eyes: PERRLA, EOMs, conjunctiva no swelling or erythema. Sinuses: No frontal/maxillary tenderness ENT/Mouth: EAC's clear, TM's nl w/o erythema, bulging. Nares clear w/o erythema, swelling, exudates. Oropharynx clear without erythema or exudates. Oral hygiene is good. Tongue normal, non obstructing. Hearing intact.  Neck: Supple. Thyroid not palpable. Car 2+/2+ without bruits, nodes or  JVD. Chest: Respirations nl with BS clear & equal w/o rales, rhonchi, wheezing or stridor.  Cor: Heart sounds normal w/ regular rate and rhythm without sig. murmurs, gallops, clicks or rubs. Peripheral pulses normal and equal  without edema.  Abdomen: Soft & bowel sounds normal. Non-tender w/o guarding, rebound, hernias, masses or organomegaly.  Lymphatics: Unremarkable.  Musculoskeletal: Full ROM all peripheral extremities, joint stability, 5/5 strength and normal gait.  Skin: Warm, dry without exposed rashes, lesions or ecchymosis apparent.  Neuro: Cranial nerves intact, reflexes equal bilaterally. Sensory-motor testing grossly intact. Tendon reflexes grossly intact.  Pysch: Alert & oriented x 3.  Insight and judgement nl & appropriate. No ideations.  Assessment and Plan:  1. Essential hypertension  - Continue medication, monitor blood pressure at home.  - Continue DASH diet.  Reminder to go to the ER if any CP,  SOB, nausea, dizziness, severe HA, changes vision/speech.  - CBC with Differential/Platelet - COMPLETE METABOLIC PANEL WITH GFR - Magnesium - TSH  2. Hyperlipidemia, mixed  - Continue diet/meds, exercise,& lifestyle modifications.  - Continue monitor periodic cholesterol/liver & renal functions   - Lipid panel - TSH  3. Abnormal glucose  - Continue diet, exercise, lifestyle modifications.  - Monitor appropriate labs.  - Hemoglobin A1c  4. Vitamin D deficiency  - Continue supplementation.   - VITAMIN D 25 Hydroxyl  5. Prediabetes  - Hemoglobin A1c - Insulin, random  6. Hypothyroidism  - TSH  7. Medication management  - CBC with Differential/Platelet - COMPLETE METABOLIC PANEL WITH GFR - Magnesium - Lipid panel - TSH - Hemoglobin A1c - Insulin, random - VITAMIN D 25 Hydroxyl     Discussed  regular exercise, BP monitoring, weight control to achieve/maintain BMI less than 25 and discussed med and SE's. Recommended labs to assess and monitor  clinical status with further disposition pending results of labs. Over 30 minutes of exam, counseling, chart review was performed.

## 2018-05-26 LAB — CBC WITH DIFFERENTIAL/PLATELET
Basophils Absolute: 40 cells/uL (ref 0–200)
Basophils Relative: 0.7 %
Eosinophils Absolute: 40 cells/uL (ref 15–500)
Eosinophils Relative: 0.7 %
HCT: 40.8 % (ref 35.0–45.0)
Hemoglobin: 14.1 g/dL (ref 11.7–15.5)
Lymphs Abs: 1904 cells/uL (ref 850–3900)
MCH: 30.4 pg (ref 27.0–33.0)
MCHC: 34.6 g/dL (ref 32.0–36.0)
MCV: 87.9 fL (ref 80.0–100.0)
MPV: 10.6 fL (ref 7.5–12.5)
Monocytes Relative: 9.3 %
Neutro Abs: 3186 cells/uL (ref 1500–7800)
Neutrophils Relative %: 55.9 %
Platelets: 187 10*3/uL (ref 140–400)
RBC: 4.64 10*6/uL (ref 3.80–5.10)
RDW: 12.4 % (ref 11.0–15.0)
Total Lymphocyte: 33.4 %
WBC mixed population: 530 cells/uL (ref 200–950)
WBC: 5.7 10*3/uL (ref 3.8–10.8)

## 2018-05-26 LAB — COMPLETE METABOLIC PANEL WITH GFR
AG Ratio: 1.2 (calc) (ref 1.0–2.5)
ALT: 36 U/L — ABNORMAL HIGH (ref 6–29)
AST: 43 U/L — ABNORMAL HIGH (ref 10–35)
Albumin: 4.4 g/dL (ref 3.6–5.1)
Alkaline phosphatase (APISO): 46 U/L (ref 33–130)
BUN/Creatinine Ratio: 20 (calc) (ref 6–22)
BUN: 22 mg/dL (ref 7–25)
CO2: 27 mmol/L (ref 20–32)
Calcium: 9.9 mg/dL (ref 8.6–10.4)
Chloride: 98 mmol/L (ref 98–110)
Creat: 1.11 mg/dL — ABNORMAL HIGH (ref 0.60–0.88)
GFR, Est African American: 52 mL/min/{1.73_m2} — ABNORMAL LOW (ref >=60)
GFR, Est Non African American: 45 mL/min/{1.73_m2} — ABNORMAL LOW (ref >=60)
Globulin: 3.6 g/dL (calc) (ref 1.9–3.7)
Glucose, Bld: 102 mg/dL — ABNORMAL HIGH (ref 65–99)
Potassium: 3.9 mmol/L (ref 3.5–5.3)
Sodium: 137 mmol/L (ref 135–146)
Total Bilirubin: 0.8 mg/dL (ref 0.2–1.2)
Total Protein: 8 g/dL (ref 6.1–8.1)

## 2018-05-26 LAB — MAGNESIUM: Magnesium: 1.8 mg/dL (ref 1.5–2.5)

## 2018-05-26 LAB — LIPID PANEL
Cholesterol: 177 mg/dL (ref <200)
HDL: 61 mg/dL (ref >50)
LDL Cholesterol (Calc): 95 mg/dL (calc)
Non-HDL Cholesterol (Calc): 116 mg/dL (calc) (ref <130)
Total CHOL/HDL Ratio: 2.9 (calc) (ref <5.0)
Triglycerides: 112 mg/dL (ref <150)

## 2018-05-26 LAB — VITAMIN D 25 HYDROXY (VIT D DEFICIENCY, FRACTURES): Vit D, 25-Hydroxy: 73 ng/mL (ref 30–100)

## 2018-05-26 LAB — TSH: TSH: 4 mIU/L (ref 0.40–4.50)

## 2018-05-26 LAB — INSULIN, RANDOM: Insulin: 7.5 u[IU]/mL (ref 2.0–19.6)

## 2018-05-26 LAB — HEMOGLOBIN A1C
Hgb A1c MFr Bld: 5.3 % of total Hgb (ref <5.7)
Mean Plasma Glucose: 105 (calc)
eAG (mmol/L): 5.8 (calc)

## 2018-05-28 ENCOUNTER — Encounter: Payer: Self-pay | Admitting: Internal Medicine

## 2018-05-31 ENCOUNTER — Other Ambulatory Visit: Payer: Self-pay | Admitting: Internal Medicine

## 2018-05-31 ENCOUNTER — Telehealth: Payer: Self-pay | Admitting: *Deleted

## 2018-05-31 MED ORDER — AZITHROMYCIN 250 MG PO TABS: ORAL_TABLET | ORAL | 0 refills | Status: DC

## 2018-05-31 MED ORDER — PROMETHAZINE-DM 6.25-15 MG/5ML PO SYRP: ORAL_SOLUTION | ORAL | 1 refills | Status: DC

## 2018-05-31 MED ORDER — DEXAMETHASONE 0.5 MG PO TABS: ORAL_TABLET | ORAL | 0 refills | Status: DC

## 2018-05-31 NOTE — Telephone Encounter (Signed)
Patient called and complained of sore throat, sinus drainage and pressure with a cough.  Dr Melford Aase ent in RX's for a Z-Pak, Dexamethasone and Promethazine-DM for the patient.  The patient is aware and will call back on 06/06/2018, or PRN, for an appointment , if not better.

## 2018-06-20 ENCOUNTER — Other Ambulatory Visit: Payer: Self-pay | Admitting: Internal Medicine

## 2018-06-20 ENCOUNTER — Other Ambulatory Visit: Payer: Self-pay | Admitting: *Deleted

## 2018-06-20 ENCOUNTER — Telehealth: Payer: Self-pay | Admitting: *Deleted

## 2018-06-20 MED ORDER — PROMETHAZINE-DM 6.25-15 MG/5ML PO SYRP: ORAL_SOLUTION | ORAL | 1 refills | Status: DC

## 2018-06-20 NOTE — Telephone Encounter (Signed)
Patient called and reported she has started coughing since yesterday and causing her ears and throat to be sore. She has tried [employer].  Dr Melford Aase sent in an RX for Promethazine -DM.  The patient is aware.

## 2018-06-20 NOTE — Telephone Encounter (Signed)
No answer

## 2018-06-22 ENCOUNTER — Ambulatory Visit (INDEPENDENT_AMBULATORY_CARE_PROVIDER_SITE_OTHER): Payer: Medicare Other | Admitting: Adult Health

## 2018-06-22 ENCOUNTER — Other Ambulatory Visit: Payer: Self-pay | Admitting: Adult Health

## 2018-06-22 ENCOUNTER — Ambulatory Visit (HOSPITAL_COMMUNITY)
Admission: RE | Admit: 2018-06-22 | Discharge: 2018-06-22 | Disposition: A | Discharge status: Home / Self Care | Payer: Medicare Other | Source: Ambulatory Visit | Attending: Adult Health | Admitting: Adult Health

## 2018-06-22 ENCOUNTER — Encounter: Payer: Self-pay | Admitting: Adult Health

## 2018-06-22 VITALS — BP 110/68 | HR 79 | Temp 97.7°F | Ht 60.0 in | Wt 187.0 lb

## 2018-06-22 DIAGNOSIS — R05 Cough: Secondary | ICD-10-CM | POA: Diagnosis not present

## 2018-06-22 DIAGNOSIS — R059 Cough, unspecified: Secondary | ICD-10-CM

## 2018-06-22 DIAGNOSIS — I517 Cardiomegaly: Secondary | ICD-10-CM | POA: Diagnosis not present

## 2018-06-22 DIAGNOSIS — J9811 Atelectasis: Secondary | ICD-10-CM | POA: Diagnosis not present

## 2018-06-22 DIAGNOSIS — K449 Diaphragmatic hernia without obstruction or gangrene: Secondary | ICD-10-CM | POA: Diagnosis not present

## 2018-06-22 MED ORDER — PREDNISONE 20 MG PO TABS: ORAL_TABLET | ORAL | 0 refills | Status: DC

## 2018-06-22 MED ORDER — LEVOFLOXACIN 750 MG PO TABS: 750.0000 mg | ORAL_TABLET | Freq: Every day | ORAL | 0 refills | Status: AC

## 2018-06-22 MED ORDER — BENZONATATE 100 MG PO CAPS: 100.0000 mg | ORAL_CAPSULE | Freq: Three times a day (TID) | ORAL | 0 refills | Status: DC | PRN

## 2018-06-22 NOTE — Progress Notes (Signed)
Assessment and Plan:  Alyssa Schmitt was seen today for cough.  Diagnoses and all orders for this visit:  Cough Bronchitis, r/o pneumonia of LLL Switch from allegra to zyrtec ER precautions given -     DG Chest 2 View; Future -     levofloxacin (LEVAQUIN) 750 MG tablet; Take 1 tablet (750 mg total) by mouth daily for 5 days. -     predniSONE (DELTASONE) 20 MG tablet; 2 tablets daily for 3 days, 1 tablet daily for 4 days. -     benzonatate (TESSALON) 100 MG capsule; Take 1 capsule (100 mg total) by mouth 3 (three) times daily as needed for cough.  Further disposition pending results of labs. Discussed med's effects and SE's.   Over 15 minutes of exam, counseling, chart review, and critical decision making was performed.   Future Appointments  Date Time Provider [place]  08/25/2018  2:30 PM [name], NP [facility] None  11/23/2018  3:45 PM Unk Pinto, MD [facility] None    ------------------------------------------------------------------------------------------------------------------   HPI 84 y.o.female with hx of GERD and mild COPD (not treated by inhalers) presents for evaluation of cough, call in and was prescribed zpak, prednisone, promethazine-DM and symptoms resolved, but started coughing again 3 days ago and has coughed so hard she had an episode of emesis. She reports is productive of sticky clear discharge, she endorses some hoarseness, denies nasal congestion, chest congestion, dyspnea, chest pain. Daughter is concerned she has heard some wheezing. She has had some right sided ear pressure and facial pressure with cough, but this has resolved. She is currently taking promethazine DM but reports she has made her drowsy and "drunk." Daughter is worried about early pneumonia. Patient appears without distress and speaks in complete sentences.   She does take allergra daily, takes prilosec 40 mg daily for GERD  Past Medical History:  Diagnosis Date  . COPD  (chronic obstructive pulmonary disease) ([place])   . GERD (gastroesophageal reflux disease)   . Hyperlipidemia   . Hypertension   . Osteoporosis   . Prediabetes   . Vitamin D deficiency      Allergies  Allergen Reactions  . Aspirin Swelling  . Celebrex [Celecoxib] Other (See Comments)    "my body lit up"  . Fosamax [Alendronate Sodium] Other (See Comments)    unknown  . Keflex [Cephalexin] Other (See Comments)    unknown  . Penicillins Other (See Comments)    unknown  . Prednisone Itching  . Prevacid [Lansoprazole] Other (See Comments)    unknown  . Sulfa Antibiotics Other (See Comments)    unknown  . Tetanus Toxoids Other (See Comments)    unknown  . Tetracyclines & Related Nausea Only    Nausea     Current Outpatient Medications on File Prior to Visit  Medication Sig  . acetaminophen (TYLENOL) 325 MG tablet Take 2 tablets (650 mg total) by mouth every 6 (six) hours as needed (prn TEMP>38.1 Celsius).  . busPIRone (BUSPAR) 10 MG tablet TAKE 1/2 - 1 TABLET 3 TIMES A DAY AS NEEDED FOR ANXIETY  . calcium-vitamin D (OSCAL WITH D) 250-125 MG-UNIT per tablet Take 1 tablet by mouth daily.  . Cholecalciferol (VITAMIN D3) 2000 UNITS TABS Take 4 tablets by mouth daily.   . citalopram (CELEXA) 20 MG tablet TAKE 1/2-1 TABLET BY MOUTH DAILY  . fexofenadine (ALLEGRA) 180 MG tablet Take 180 mg by mouth daily.   [name] Kitchen gabapentin (NEURONTIN) 100 MG capsule Take 1 capsule (100 mg total) by mouth  3 (three) times daily.  . hydrochlorothiazide (HYDRODIURIL) 25 MG tablet Take 1 tablet (25 mg total) by mouth daily.  [name] Kitchen levothyroxine (SYNTHROID, LEVOTHROID) 50 MCG tablet TAKE 1 TABLET BY MOUTH DAILY  . magnesium oxide (MAG-OX) 400 (241.3 MG) MG tablet Take 1 tablet (400 mg total) by mouth 2 (two) times daily.  . Multiple Vitamin (MULITIVITAMIN WITH MINERALS) TABS Take 1 tablet by mouth daily.  [name] Kitchen omeprazole (PRILOSEC) 40 MG capsule TAKE ONE CAPSULE BY MOUTH EVERY DAY  . Probiotic Product (PROBIOTIC  FORMULA PO) Take 1 tablet by mouth daily.   . promethazine-dextromethorphan (PROMETHAZINE-DM) 6.25-15 MG/5ML syrup Take 1 to 2 tsp enery 4 hours if needed for cough  . terazosin (HYTRIN) 10 MG capsule TAKE 1 CAPSULE (10 MG TOTAL) BY MOUTH DAILY.   No current facility-administered medications on file prior to visit.     ROS: all negative except above.   Physical Exam:  Ht 5' (1.524 m)   Wt 187 lb (84.8 kg)   BMI 36.52 kg/m   General Appearance: Well nourished, in no apparent distress. Eyes: PERRLA, EOMs, conjunctiva no swelling or erythema Sinuses: No Frontal/maxillary tenderness ENT/Mouth: Ext aud canals clear, TMs without erythema, bulging. No erythema, swelling, or exudate on post pharynx.  Tonsils absent. Hearing normal.  Neck: Supple, thyroid normal.  Respiratory: Respiratory effort normal, BS demonstrate rhonchi to right middle lobe, somewhat quiet in LLL without rales, wheezing or stridor.  Cardio: RRR with 3/6 blowing systolic murmur (per daughter she does have murmur at baseline). Brisk peripheral pulses without edema.  Abdomen: Soft, + BS.  Non tender, no guarding, rebound, hernias, masses. Lymphatics: Non tender without lymphadenopathy.  Musculoskeletal: Full ROM, symmetrical strength, slow steady gait with walker.   Skin: Warm, dry without rashes, lesions, ecchymosis.  Neuro: Cranial nerves intact. Normal muscle tone, no cerebellar symptoms. Sensation intact.  Psych: Awake and oriented X 3, normal affect, Insight and Judgment appropriate.     Izora Ribas, NP 2:15 PM [facility]

## 2018-06-22 NOTE — Patient Instructions (Signed)

## 2018-06-24 ENCOUNTER — Telehealth: Payer: Self-pay

## 2018-06-24 NOTE — Telephone Encounter (Signed)
Taking 10mg  of Prednisone 4 times a day, started yesterday, took 4 yesterday and 1 today and she is breaking out on her skin, arms and chest.  Per provider advised to stop taking the Levaquin and Prednisone. Can take Benadryl and Ranitidine. Is to go to the ER if symptoms get worse and has SOB.

## 2018-07-11 ENCOUNTER — Ambulatory Visit (INDEPENDENT_AMBULATORY_CARE_PROVIDER_SITE_OTHER): Payer: Medicare Other

## 2018-07-11 ENCOUNTER — Other Ambulatory Visit: Payer: Self-pay | Admitting: Internal Medicine

## 2018-07-11 DIAGNOSIS — R300 Dysuria: Secondary | ICD-10-CM

## 2018-07-11 MED ORDER — CIPROFLOXACIN HCL 250 MG PO TABS: ORAL_TABLET | ORAL | 0 refills | Status: DC

## 2018-07-11 NOTE — Progress Notes (Signed)
Pt reports hurting when voiding w/ some burning. (1 day or more). Pt would like something called into pharmacy today if possible.

## 2018-07-12 ENCOUNTER — Telehealth: Payer: Self-pay | Admitting: *Deleted

## 2018-07-12 DIAGNOSIS — R300 Dysuria: Secondary | ICD-10-CM | POA: Diagnosis not present

## 2018-07-12 NOTE — Telephone Encounter (Signed)
Patient called and complained of nervousness with joints and legs hurting.  Dr Melford Aase advised it should not be the Cipro and needs to try to take the medication.

## 2018-07-13 LAB — URINE CULTURE
MICRO NUMBER:: 90929935
SPECIMEN QUALITY:: ADEQUATE

## 2018-07-13 LAB — URINALYSIS, ROUTINE W REFLEX MICROSCOPIC
Bilirubin Urine: NEGATIVE
Glucose, UA: NEGATIVE
Ketones, ur: NEGATIVE
Nitrite: POSITIVE — AB
Specific Gravity, Urine: 1.029 (ref 1.001–1.03)
pH: 5.5 (ref 5.0–8.0)

## 2018-07-27 DIAGNOSIS — Z1231 Encounter for screening mammogram for malignant neoplasm of breast: Secondary | ICD-10-CM | POA: Diagnosis not present

## 2018-07-27 LAB — HM MAMMOGRAPHY

## 2018-08-03 DIAGNOSIS — L603 Nail dystrophy: Secondary | ICD-10-CM | POA: Diagnosis not present

## 2018-08-03 DIAGNOSIS — I739 Peripheral vascular disease, unspecified: Secondary | ICD-10-CM | POA: Diagnosis not present

## 2018-08-24 NOTE — Progress Notes (Signed)
MEDICARE ANNUAL WELLNESS VISIT AND FOLLOW UP  Assessment:   Alyssa Schmitt was seen today for follow-up.  Diagnoses and all orders for this visit:  Essential hypertension -     Continue hctz 25 mg daily, terazosin 10 mg daily -     CBC with Differential/Platelet -     CMP/GFR -     Magnesium  Chronic bronchitis, unspecified chronic bronchitis type ([place])       -     Stable; patient reports she is able to complete ADLs without difficulty  Gastroesophageal reflux disease, esophagitis presence not specified -     Magnesium -     Continue H2blocker/PPI  Hypothyroidism -     Continue levothyroxine 50 mcg daily -     TSH  Osteoporosis, unspecified osteoporosis type, unspecified pathological fracture presence -     Has not tolerated medications -     Encouraged to continue with home exercises/weights -     Consider PT as needed for maintenance of strength and stability, which she declines today  Vitamin D deficiency At goal at recent check; continue to recommend supplementation for goal of 70-100 Defer vitamin D level  Morbid obesity (BMI 35-39.9)      -      Patient is working on home exercises. Discussed diet, encouraged reduced sugar/white flour products, push vegetables, lean proteins, whole grains.   Depression, major, in remission ([place])      -      Currently well managed with celexa 20 mg daily, buspar 10 mg TID PRN anxiety  Medication management -     CBC with Differential/Platelet -     CMP/GFR -     TSH -     Magnesium  Hypertensive retinopathy of both eyes      -      Continue close monitoring of BPs; continue annual follow up with Dr. Payton [place] office  Varicose veins of bilateral lower extremities with other complications        -      Continue with support hose daily  Other abnormal glucose -     Continue diet/exercise, controlled weight loss Recent A1Cs at goal Discussed diet/exercise, weight management  Defer A1C; check CMP  Need for immunization against  influenza -     Flu vaccine HIGH DOSE PF (Fluzone High dose)   Over 40 minutes of exam, counseling, chart review and critical decision making was performed Future Appointments  Date Time Provider [place]  11/23/2018  3:45 PM Unk Pinto, MD [facility] None     Plan:   During the course of the visit the patient was educated and counseled about appropriate screening and preventive services including:    Pneumococcal vaccine   Prevnar 13  Influenza vaccine  Td vaccine  Screening electrocardiogram  Bone densitometry screening  Colorectal cancer screening  Diabetes screening  Glaucoma screening  Nutrition counseling   Advanced directives: requested   Subjective:  Alyssa Schmitt is a 84 y.o. very pleasant [place] female who presents for Medicare Annual Wellness Visit and 3 month follow up on chronic medical conditions.   She denies any concerns today, reports she has been very well these past few months, and has been working hard on a home exercise routine that she can do from her chair and with her walker. She is driven to our visit today by her daughter whom she lives with after her husband passed away nearly 10 years.  BMI is Body mass index is 36.[place]  kg/m., she has been working on diet and exercise. Having oatmeal and applesauce for breakfast, banana for snack, yogurt, 1/2 sandwich with corn chips for lunch. Drinks water all day.  Wt Readings from Last 3 Encounters:  08/25/18 189 lb (85.7 kg)  07/11/18 186 lb (84.4 kg)  06/22/18 187 lb (84.8 kg)   Her blood pressure has been controlled at home, today their BP is BP: 110/74  She does workout. She denies chest pain, shortness of breath, dizziness.   She is not on cholesterol medication and denies myalgias. Her cholesterol is at goal. The cholesterol last visit was:   Lab Results  Component Value Date   CHOL 177 05/25/2018   HDL 61 05/25/2018   LDLCALC 95 05/25/2018   TRIG 112 05/25/2018    CHOLHDL 2.9 05/25/2018    She has been working on diet and exercise for glucose management, and denies increased appetite, nausea, paresthesia of the feet, polydipsia and polyuria. Last A1C in the office was:  Lab Results  Component Value Date   HGBA1C 5.3 05/25/2018   She is on thyroid medication. Her medication was not changed last visit.   Lab Results  Component Value Date   TSH 4.00 05/25/2018   Patient is on Vitamin D supplement and at goal:  Lab Results  Component Value Date   VD25OH 73 05/25/2018      Medication Review: Current Outpatient Medications on File Prior to Visit  Medication Sig Dispense Refill  . acetaminophen (TYLENOL) 325 MG tablet Take 2 tablets (650 mg total) by mouth every 6 (six) hours as needed (prn TEMP>38.1 Celsius). 30 tablet 2  . busPIRone (BUSPAR) 10 MG tablet TAKE 1/2 - 1 TABLET 3 TIMES A DAY AS NEEDED FOR ANXIETY 270 tablet 1  . calcium-vitamin D (OSCAL WITH D) 250-125 MG-UNIT per tablet Take 1 tablet by mouth daily.    . Cholecalciferol (VITAMIN D3) 2000 UNITS TABS Take 4 tablets by mouth daily.     . citalopram (CELEXA) 20 MG tablet TAKE 1/2-1 TABLET BY MOUTH DAILY 90 tablet 1  . fexofenadine (ALLEGRA) 180 MG tablet Take 180 mg by mouth daily.     [name] Kitchen gabapentin (NEURONTIN) 100 MG capsule Take 1 capsule (100 mg total) by mouth 3 (three) times daily. 30 capsule 0  . hydrochlorothiazide (HYDRODIURIL) 25 MG tablet Take 1 tablet (25 mg total) by mouth daily. 90 tablet 1  . levothyroxine (SYNTHROID, LEVOTHROID) 50 MCG tablet TAKE 1 TABLET BY MOUTH DAILY 90 tablet 3  . magnesium oxide (MAG-OX) 400 (241.3 MG) MG tablet Take 1 tablet (400 mg total) by mouth 2 (two) times daily. 60 tablet 2  . Multiple Vitamin (MULITIVITAMIN WITH MINERALS) TABS Take 1 tablet by mouth daily.    [name] Kitchen omeprazole (PRILOSEC) 40 MG capsule TAKE ONE CAPSULE BY MOUTH EVERY DAY 90 capsule 4  . Probiotic Product (PROBIOTIC FORMULA PO) Take 1 tablet by mouth daily.     [name] Kitchen terazosin (HYTRIN)  10 MG capsule TAKE 1 CAPSULE (10 MG TOTAL) BY MOUTH DAILY. 90 capsule 3  . benzonatate (TESSALON) 100 MG capsule Take 1 capsule (100 mg total) by mouth 3 (three) times daily as needed for cough. 60 capsule 0  . ciprofloxacin (CIPRO) 250 MG tablet Take 1 tablet 2 x /day with food for 7 days for UTI 14 tablet 0  . predniSONE (DELTASONE) 20 MG tablet 2 tablets daily for 3 days, 1 tablet daily for 4 days. 10 tablet 0  . promethazine-dextromethorphan (PROMETHAZINE-DM) 6.25-15 MG/5ML syrup  Take 1 to 2 tsp enery 4 hours if needed for cough 360 mL 1   No current facility-administered medications on file prior to visit.     Allergies  Allergen Reactions  . Aspirin Swelling  . Celebrex [Celecoxib] Other (See Comments)    "my body lit up"  . Fosamax [Alendronate Sodium] Other (See Comments)    unknown  . Keflex [Cephalexin] Other (See Comments)    unknown  . Penicillins Other (See Comments)    unknown  . Prednisone Itching  . Prevacid [Lansoprazole] Other (See Comments)    unknown  . Sulfa Antibiotics Other (See Comments)    unknown  . Tetanus Toxoids Other (See Comments)    unknown  . Tetracyclines & Related Nausea Only    Nausea     Current Problems (verified) Patient Active Problem List   Diagnosis Date Noted  . Hypertensive retinopathy of both eyes 09/21/2017  . Depression, major, in remission ([place]) 06/22/2016  . COPD 08/15/2015  . GERD  08/15/2015  . Osteoporosis 07/16/2015  . Hypothyroidism   . Morbid obesity ([place]) 08/01/2014  . Medication management 03/20/2014  . Hypertension   . Other abnormal glucose   . Vitamin D deficiency   . Varicose veins of bilateral lower extremities with other complications 74/25/9563    Screening Tests Immunization History  Administered Date(s) Administered  . Influenza, High Dose Seasonal PF 09/05/2014, 09/17/2015, 08/25/2016, 09/22/2017  . Pneumococcal Conjugate-13 11/20/2015  . Pneumococcal Polysaccharide-23 12/18/2007  . Zoster  07/25/2013   Preventative care: Last colonoscopy: 2001 will not get another Last mammogram: had this year at [facility], "was perfect" - pending receipt of report Last pap smear/pelvic exam: Remote with Dr. Philis Pique   DEXA: 2016 T -3.1 Intolerant of medications, declines further Echo 2013  Prior vaccinations: TD or Tdap: ALLERGY  Influenza: 2018, DUE TODAY Pneumococcal: 2009 Prevnar 13: 2016 Shingles/Zostavax: 2014  Names of Other Physician/Practitioners you currently use: 1. [facility] here for primary care 2. Dr. Marshall Cork, eye doctor, last visit early this year (2018), has scheduled 08/31/2018 3. Wears full dentures, last dentist several years ago, denies issues  Patient Care Team: Unk Pinto, MD as PCP - General (Internal Medicine) Nahser, Wonda Cheng, MD (Cardiology) Unk Pinto, MD as Attending Physician (Internal Medicine) Kelby Aline, PA-C as Physician Assistant (Physician Assistant) Monna Fam, MD as Consulting Physician (Ophthalmology) Mal Misty, MD as Consulting Physician (Vascular Surgery) Despina Hick, MD as Referring Physician (Radiology) Garald Balding, MD as Consulting Physician (Orthopedic Surgery) Clarene Essex, MD as Consulting Physician (Gastroenterology)  SURGICAL HISTORY She  has a past surgical history that includes Tonsillectomy and Vesicovaginal fistula closure w/ TAH. FAMILY HISTORY Her family history includes Diabetes in her father; Heart disease in her mother; Other in her brother and mother. SOCIAL HISTORY She  reports that she has never smoked. She has never used smokeless tobacco. She reports that she does not drink alcohol or use drugs.   MEDICARE WELLNESS OBJECTIVES: Physical activity: Current Exercise Habits: Home exercise routine, Type of exercise: stretching;strength training/weights;walking, Time (Minutes): 20, Frequency (Times/Week): 7, Weekly Exercise (Minutes/Week):  140, Intensity: Mild, Exercise limited by: orthopedic condition(s) Cardiac risk factors: Cardiac Risk Factors include: advanced age (>42men, >9 women);hypertension;obesity (BMI >30kg/m2);sedentary lifestyle Depression/mood screen:   Depression screen [facility] 2/9 08/25/2018  Decreased Interest 0  Down, Depressed, Hopeless 0  PHQ - 2 Score 0    ADLs:  In your present state of health, do you have any difficulty  performing the following activities: 08/25/2018 05/28/2018  Hearing? N N  Vision? N N  Difficulty concentrating or making decisions? N N  Walking or climbing stairs? Y N  Comment uses walker, does well -  Dressing or bathing? N N  Doing errands, shopping? Y N  Comment driven by daughter -  Conservation officer, nature and eating ? N -  Using the Toilet? N -  In the past six months, have you accidently leaked urine? N -  Do you have problems with loss of bowel control? N -  Managing your Medications? N -  Managing your Finances? N -  Housekeeping or managing your Housekeeping? N -  Some recent data might be hidden     Cognitive Testing  Alert? Yes  Normal Appearance?Yes  Oriented to person? Yes  Place? Yes   Time? Yes  Recall of three objects?  Yes  Can perform simple calculations? Yes  Displays appropriate judgment?Yes  Can read the correct time from a watch face?Yes  EOL planning: Does Patient Have a Medical Advance Directive?: Yes Type of Advance Directive: Healthcare Power of Attorney, Living will Does patient want to make changes to medical advance directive?: No - Patient declined Copy of [place] in Chart?: Yes  Review of Systems  Constitutional: Negative.  Negative for malaise/fatigue.  HENT: Negative for congestion, hearing loss and sore throat.   Eyes: Negative for blurred vision.  Respiratory: Positive for sputum production (AM with awakening; clear). Negative for cough, shortness of breath and wheezing.   Cardiovascular: Negative for chest pain,  palpitations, orthopnea, claudication and leg swelling.  Gastrointestinal: Negative for abdominal pain, blood in stool, constipation, diarrhea, heartburn, melena, nausea and vomiting.  Genitourinary: Negative.  Negative for frequency.  Musculoskeletal: Negative for falls, joint pain and myalgias.  Skin: Negative.   Neurological: Negative for dizziness, tingling, tremors, sensory change, focal weakness, weakness and headaches.  Endo/Heme/Allergies: Negative.  Negative for polydipsia. Does not bruise/bleed easily.  Psychiatric/Behavioral: Negative.  Negative for depression, memory loss and suicidal ideas.     Objective:     Today's Vitals   08/25/18 1425  BP: 110/74  Pulse: 81  Temp: (!) 97.3 F (36.3 C)  SpO2: 96%  Weight: 189 lb (85.7 kg)  Height: 5' (1.524 m)   Body mass index is 36.91 kg/m.  General appearance: alert, no distress, WD/WN, female with walker HEENT: normocephalic, sclerae anicteric, TMs pearly, nares patent, no discharge or erythema, pharynx normal Oral cavity: MMM, no lesions, upper and lower dentures present Neck: supple, no lymphadenopathy, no palpable thyromegaly, no masses Heart: RRR, normal S1, S2, 2/6 early systolic decreshendo murmur. Patient reports + history of murmur.  Lungs: CTA bilaterally, no wheezes, rhonchi, or rales Abdomen: +bs, soft, non tender, non distended, no masses, no hepatomegaly, no splenomegaly Musculoskeletal: nontender, no swelling, some kyphosis of upper back with walker use for ambulation Extremities: no edema, no cyanosis, no clubbing Pulses: 2+ symmetric, upper and lower extremities, normal cap refill Neurological: alert, oriented x 3, CN2-12 intact, weak but symmetric upper and lower extremity strength, sensation normal throughout, DTRs 2+ throughout, no cerebellar signs, gait slow with walker Psychiatric: normal affect, behavior normal, pleasant   Medicare Attestation I have personally reviewed: The patient's medical and  social history Their use of alcohol, tobacco or illicit drugs Their current medications and supplements The patient's functional ability including ADLs,fall risks, home safety risks, cognitive, and hearing and visual impairment Diet and physical activities Evidence for depression or mood disorders  The  patient's weight, height, BMI, and visual acuity have been recorded in the chart.  I have made referrals, counseling, and provided education to the patient based on review of the above and I have provided the patient with a written personalized care plan for preventive services.     Izora Ribas, NP   08/25/2018

## 2018-08-25 ENCOUNTER — Ambulatory Visit (INDEPENDENT_AMBULATORY_CARE_PROVIDER_SITE_OTHER): Payer: Medicare Other | Admitting: Adult Health

## 2018-08-25 ENCOUNTER — Encounter: Payer: Self-pay | Admitting: Adult Health

## 2018-08-25 VITALS — BP 110/74 | HR 81 | Temp 97.3°F | Ht 60.0 in | Wt 189.0 lb

## 2018-08-25 DIAGNOSIS — I83893 Varicose veins of bilateral lower extremities with other complications: Secondary | ICD-10-CM

## 2018-08-25 DIAGNOSIS — M810 Age-related osteoporosis without current pathological fracture: Secondary | ICD-10-CM

## 2018-08-25 DIAGNOSIS — I10 Essential (primary) hypertension: Secondary | ICD-10-CM | POA: Diagnosis not present

## 2018-08-25 DIAGNOSIS — Z79899 Other long term (current) drug therapy: Secondary | ICD-10-CM

## 2018-08-25 DIAGNOSIS — E559 Vitamin D deficiency, unspecified: Secondary | ICD-10-CM

## 2018-08-25 DIAGNOSIS — J42 Unspecified chronic bronchitis: Secondary | ICD-10-CM

## 2018-08-25 DIAGNOSIS — E038 Other specified hypothyroidism: Secondary | ICD-10-CM | POA: Diagnosis not present

## 2018-08-25 DIAGNOSIS — R7309 Other abnormal glucose: Secondary | ICD-10-CM | POA: Diagnosis not present

## 2018-08-25 DIAGNOSIS — K219 Gastro-esophageal reflux disease without esophagitis: Secondary | ICD-10-CM

## 2018-08-25 DIAGNOSIS — Z0001 Encounter for general adult medical examination with abnormal findings: Secondary | ICD-10-CM

## 2018-08-25 DIAGNOSIS — Z0000 Encounter for general adult medical examination without abnormal findings: Secondary | ICD-10-CM

## 2018-08-25 DIAGNOSIS — R6889 Other general symptoms and signs: Secondary | ICD-10-CM

## 2018-08-25 DIAGNOSIS — F325 Major depressive disorder, single episode, in full remission: Secondary | ICD-10-CM

## 2018-08-25 DIAGNOSIS — Z23 Encounter for immunization: Secondary | ICD-10-CM | POA: Diagnosis not present

## 2018-08-25 DIAGNOSIS — H35033 Hypertensive retinopathy, bilateral: Secondary | ICD-10-CM

## 2018-08-25 LAB — COMPLETE METABOLIC PANEL WITH GFR
AG Ratio: 1.2 (calc) (ref 1.0–2.5)
ALT: 45 U/L — ABNORMAL HIGH (ref 6–29)
AST: 60 U/L — ABNORMAL HIGH (ref 10–35)
Albumin: 4.4 g/dL (ref 3.6–5.1)
Alkaline phosphatase (APISO): 47 U/L (ref 33–130)
BUN/Creatinine Ratio: 21 (calc) (ref 6–22)
BUN: 22 mg/dL (ref 7–25)
CO2: 28 mmol/L (ref 20–32)
Calcium: 10.4 mg/dL (ref 8.6–10.4)
Chloride: 94 mmol/L — ABNORMAL LOW (ref 98–110)
Creat: 1.03 mg/dL — ABNORMAL HIGH (ref 0.60–0.88)
GFR, Est African American: 57 mL/min/{1.73_m2} — ABNORMAL LOW (ref >=60)
GFR, Est Non African American: 49 mL/min/{1.73_m2} — ABNORMAL LOW (ref >=60)
Globulin: 3.6 g/dL (calc) (ref 1.9–3.7)
Glucose, Bld: 98 mg/dL (ref 65–99)
Potassium: 3.8 mmol/L (ref 3.5–5.3)
Sodium: 135 mmol/L (ref 135–146)
Total Bilirubin: 0.6 mg/dL (ref 0.2–1.2)
Total Protein: 8 g/dL (ref 6.1–8.1)

## 2018-08-25 LAB — CBC WITH DIFFERENTIAL/PLATELET
Basophils Absolute: 29 cells/uL (ref 0–200)
Basophils Relative: 0.7 %
Eosinophils Absolute: 59 cells/uL (ref 15–500)
Eosinophils Relative: 1.4 %
HCT: 41.3 % (ref 35.0–45.0)
Hemoglobin: 14.1 g/dL (ref 11.7–15.5)
Lymphs Abs: 1504 cells/uL (ref 850–3900)
MCH: 30.8 pg (ref 27.0–33.0)
MCHC: 34.1 g/dL (ref 32.0–36.0)
MCV: 90.2 fL (ref 80.0–100.0)
MPV: 11.2 fL (ref 7.5–12.5)
Monocytes Relative: 11.7 %
Neutro Abs: 2117 cells/uL (ref 1500–7800)
Neutrophils Relative %: 50.4 %
Platelets: 174 10*3/uL (ref 140–400)
RBC: 4.58 10*6/uL (ref 3.80–5.10)
RDW: 12.2 % (ref 11.0–15.0)
Total Lymphocyte: 35.8 %
WBC mixed population: 491 cells/uL (ref 200–950)
WBC: 4.2 10*3/uL (ref 3.8–10.8)

## 2018-08-25 LAB — MAGNESIUM: Magnesium: 1.7 mg/dL (ref 1.5–2.5)

## 2018-08-25 LAB — TSH: TSH: 3.91 mIU/L (ref 0.40–4.50)

## 2018-08-25 NOTE — Patient Instructions (Addendum)
  [name], nurse practitioner    Ms. Alyssa Schmitt , Thank you for taking time to come for your Medicare Wellness Visit. I appreciate your ongoing commitment to your health goals. Please review the following plan we discussed and let me know if I can assist you in the future.   These are the goals we discussed: Goals    . Blood Pressure < 140/90    . Exercise 5 x per week (30 min per time)       This is a list of the screening recommended for you and due dates:  Health Maintenance  Topic Date Due  . Flu Shot  07/07/2018  . Mammogram  07/16/2018  . DEXA scan (bone density measurement)  Completed  . Pneumonia vaccines  Completed     Aim for 7+ servings of fruits and vegetables daily  65-80+ fluid ounces of water or unsweet tea for healthy kidneys  Limit to max 1 drink of alcohol per day; avoid smoking/tobacco  Limit animal fats in diet for cholesterol and heart health - choose grass fed whenever available  Avoid highly processed foods, and foods high in saturated/trans fats  Aim for low stress - take time to unwind and care for your mental health  Aim for 150 min of moderate intensity exercise weekly for heart health, and weights twice weekly for bone health  Aim for 7-9 hours of sleep daily

## 2018-08-25 NOTE — Addendum Note (Signed)
Addended by: Chancy Hurter on: 08/25/2018 03:21 PM   Modules accepted: Orders

## 2018-08-26 ENCOUNTER — Encounter: Payer: Self-pay | Admitting: Adult Health

## 2018-08-26 DIAGNOSIS — R945 Abnormal results of liver function studies: Secondary | ICD-10-CM

## 2018-08-26 DIAGNOSIS — R7989 Other specified abnormal findings of blood chemistry: Secondary | ICD-10-CM | POA: Insufficient documentation

## 2018-08-31 DIAGNOSIS — H40013 Open angle with borderline findings, low risk, bilateral: Secondary | ICD-10-CM | POA: Diagnosis not present

## 2018-08-31 DIAGNOSIS — H25013 Cortical age-related cataract, bilateral: Secondary | ICD-10-CM | POA: Diagnosis not present

## 2018-08-31 DIAGNOSIS — H2513 Age-related nuclear cataract, bilateral: Secondary | ICD-10-CM | POA: Diagnosis not present

## 2018-08-31 DIAGNOSIS — H40033 Anatomical narrow angle, bilateral: Secondary | ICD-10-CM | POA: Diagnosis not present

## 2018-08-31 LAB — HM DIABETES EYE EXAM

## 2018-09-01 ENCOUNTER — Encounter: Payer: Self-pay | Admitting: *Deleted

## 2018-09-19 ENCOUNTER — Other Ambulatory Visit: Payer: Self-pay | Admitting: Internal Medicine

## 2018-09-19 MED ORDER — TRIAMCINOLONE ACETONIDE 0.5 % EX OINT: 1.0000 "application " | TOPICAL_OINTMENT | Freq: Two times a day (BID) | CUTANEOUS | 3 refills | Status: DC

## 2018-09-26 NOTE — Progress Notes (Signed)
Assessment and Plan:  Alyssa Schmitt was seen today for wound check.  Diagnoses and all orders for this visit:  Open wound of left ankle, initial encounter No signs of cellulitis or concerning infection; doing well with betadine/sugar dressings Continue, discussed wound care, concerning signs to monitor for Follow up if any signs of infection, or with new pain, fever/chills, confusion Dressing reapplied prior to departure.   Hypertension Continue medications Monitor blood pressure at home; call if consistently over 130/80 Continue DASH diet.   Reminder to go to the ER if any CP, SOB, nausea, dizziness, severe HA, changes vision/speech, left arm numbness and tingling and jaw pain.   Further disposition pending results of labs. Discussed med's effects and SE's.   Over 15 minutes of exam, counseling, chart review, and critical decision making was performed.   Future Appointments  Date Time Provider [place]  12/21/2018  3:00 PM Unk Pinto, MD [facility] None    ------------------------------------------------------------------------------------------------------------------   HPI BP 122/78   Pulse (!) 102   Temp 97.7 F (36.5 C)   Ht 5' (1.524 m)   Wt 185 lb (83.9 kg)   SpO2 92%   BMI 36.13 kg/m   84 y.o.female presents accompanied by a family member for evalauation of leg; she presents with small superficial open wound to left lateral ankle, approx 1.5 cm, open without notable drainage, heat or redness. She reports she had a dry patch of skin that tore ~2 weeks ago, and after consulting Dr. Melford Aase has been applying mixture of betadine and sugar with daily dressing changes. Denies pain, fever/chills, reports overall improving in appearance.   Discussed O2 sats; has hx of COPD that has been asymptomatic, not on inhalers; she does not wear O2, denies dyspnea, chest pain, dizziness - this appears consistent with her history.   Her blood pressure has been controlled at  home, today their BP is BP: 122/78  She does not workout. She denies chest pain, shortness of breath, dizziness.   Past Medical History:  Diagnosis Date  . COPD (chronic obstructive pulmonary disease) ([place])   . GERD (gastroesophageal reflux disease)   . Hyperlipidemia   . Hypertension   . Osteoporosis   . Prediabetes   . Vitamin D deficiency      Allergies  Allergen Reactions  . Aspirin Swelling  . Celebrex [Celecoxib] Other (See Comments)    "my body lit up"  . Fosamax [Alendronate Sodium] Other (See Comments)    unknown  . Keflex [Cephalexin] Other (See Comments)    unknown  . Penicillins Other (See Comments)    unknown  . Prednisone Itching  . Prevacid [Lansoprazole] Other (See Comments)    unknown  . Sulfa Antibiotics Other (See Comments)    unknown  . Tetanus Toxoids Other (See Comments)    unknown  . Tetracyclines & Related Nausea Only    Nausea     Current Outpatient Medications on File Prior to Visit  Medication Sig  . acetaminophen (TYLENOL) 325 MG tablet Take 2 tablets (650 mg total) by mouth every 6 (six) hours as needed (prn TEMP>38.1 Celsius).  . benzonatate (TESSALON) 100 MG capsule Take 1 capsule (100 mg total) by mouth 3 (three) times daily as needed for cough.  . busPIRone (BUSPAR) 10 MG tablet TAKE 1/2 - 1 TABLET 3 TIMES A DAY AS NEEDED FOR ANXIETY  . calcium-vitamin D (OSCAL WITH D) 250-125 MG-UNIT per tablet Take 1 tablet by mouth daily.  . Cholecalciferol (VITAMIN D3) 2000 UNITS TABS Take  4 tablets by mouth daily.   . ciprofloxacin (CIPRO) 250 MG tablet Take 1 tablet 2 x /day with food for 7 days for UTI  . citalopram (CELEXA) 20 MG tablet TAKE 1/2-1 TABLET BY MOUTH DAILY  . fexofenadine (ALLEGRA) 180 MG tablet Take 180 mg by mouth daily.   [name] Kitchen gabapentin (NEURONTIN) 100 MG capsule Take 1 capsule (100 mg total) by mouth 3 (three) times daily.  . hydrochlorothiazide (HYDRODIURIL) 25 MG tablet Take 1 tablet (25 mg total) by mouth daily.  [name] Kitchen  levothyroxine (SYNTHROID, LEVOTHROID) 50 MCG tablet TAKE 1 TABLET BY MOUTH DAILY  . magnesium oxide (MAG-OX) 400 (241.3 MG) MG tablet Take 1 tablet (400 mg total) by mouth 2 (two) times daily.  . Multiple Vitamin (MULITIVITAMIN WITH MINERALS) TABS Take 1 tablet by mouth daily.  [name] Kitchen omeprazole (PRILOSEC) 40 MG capsule TAKE ONE CAPSULE BY MOUTH EVERY DAY  . predniSONE (DELTASONE) 20 MG tablet 2 tablets daily for 3 days, 1 tablet daily for 4 days.  . Probiotic Product (PROBIOTIC FORMULA PO) Take 1 tablet by mouth daily.   . promethazine-dextromethorphan (PROMETHAZINE-DM) 6.25-15 MG/5ML syrup Take 1 to 2 tsp enery 4 hours if needed for cough  . terazosin (HYTRIN) 10 MG capsule TAKE 1 CAPSULE (10 MG TOTAL) BY MOUTH DAILY.  [name] Kitchen triamcinolone ointment (KENALOG) 0.5 % Apply 1 application topically 2 (two) times daily. Apply sparingly to rash  2 x /day   No current facility-administered medications on file prior to visit.     ROS: all negative except above.   Physical Exam:  BP 122/78   Pulse (!) 102   Temp 97.7 F (36.5 C)   Ht 5' (1.524 m)   Wt 185 lb (83.9 kg)   SpO2 92%   BMI 36.13 kg/m   General Appearance: Well nourished, in no apparent distress. Eyes: conjunctiva no swelling or erythema ENT/Mouth: Hearing normal.  Neck: Supple Respiratory: Respiratory effort normal, BS equal bilaterally without rales, rhonchi, wheezing or stridor.  Cardio: RRR with blowing 3/6 systolic murmur (not new per patient). Brisk peripheral pulses without edema.  Abdomen: Soft, + BS.  Non tender. Lymphatics: Non tender without lymphadenopathy.  Musculoskeletal: In wheelchair Skin: Warm, dry without rashes, ecchymosis. Superficial left lateral ankle wound, approx 1.5 cm round area without notable discharge, injection, swelling, erythema or heat. Surrounding area non-tender.  Neuro: Cranial nerves intact. Normal muscle tone, no cerebellar symptoms. Sensation intact.  Psych: Awake and oriented X 3, normal affect,  Insight and Judgment appropriate.     Izora Ribas, NP 2:54 PM [facility]

## 2018-09-27 ENCOUNTER — Ambulatory Visit (INDEPENDENT_AMBULATORY_CARE_PROVIDER_SITE_OTHER): Payer: Medicare Other | Admitting: Adult Health

## 2018-09-27 ENCOUNTER — Encounter: Payer: Self-pay | Admitting: Adult Health

## 2018-09-27 VITALS — BP 122/78 | HR 102 | Temp 97.7°F | Ht 60.0 in | Wt 185.0 lb

## 2018-09-27 DIAGNOSIS — I10 Essential (primary) hypertension: Secondary | ICD-10-CM | POA: Diagnosis not present

## 2018-09-27 DIAGNOSIS — S91002A Unspecified open wound, left ankle, initial encounter: Secondary | ICD-10-CM | POA: Diagnosis not present

## 2018-09-27 DIAGNOSIS — J42 Unspecified chronic bronchitis: Secondary | ICD-10-CM | POA: Diagnosis not present

## 2018-09-27 NOTE — Patient Instructions (Signed)
Wound care to do every day . Take off old bandage . Rinse off leg thoroughly in shower, wipe off any excess thick yellow discharge until you see a clean wound with some gauze or soft washcloth . Mix up betadine slurry: Combine sugar and betadine in a container that you can seal and keep; mix ingredients to create a paste the consistency of peanut butter . Apply betadine and sugar mixture to wound to cover all edges, apply non-adherent dressing pads, then an absorbent dressing (gauze or ABD) to catch any excess leaking, then wrap with ace, gauze wrapping and or tape to hold dressing in place.  . Remember betadine will stain clothing and sheets - make sure to cover/protect any furniture or linens - you can put down absorbent chucks pad or trash bags     Wound Care, Adult Taking care of your wound properly can help to prevent pain and infection. It can also help your wound to heal more quickly. How is this treated? Wound care  Follow instructions from your health care provider about how to take care of your wound. Make sure you: ? Wash your hands with soap and water before you change the bandage (dressing). If soap and water are not available, use hand sanitizer. ? Change your dressing as told by your health care provider. ? Leave stitches (sutures), skin glue, or adhesive strips in place. These skin closures may need to stay in place for 2 weeks or longer. If adhesive strip edges start to loosen and curl up, you may trim the loose edges. Do not remove adhesive strips completely unless your health care provider tells you to do that.  Check your wound area every day for signs of infection. Check for: ? More redness, swelling, or pain. ? More fluid or blood. ? Warmth. ? Pus or a bad smell.  Ask your health care provider if you should clean the wound with mild soap and water. Doing this may include: ? Using a clean towel to pat the wound dry after cleaning it. Do not rub or scrub the  wound. ? Applying a cream or ointment. Do this only as told by your health care provider. ? Covering the incision with a clean dressing.  Ask your health care provider when you can leave the wound uncovered. Medicines   If you were prescribed an antibiotic medicine, cream, or ointment, take or use the antibiotic as told by your health care provider. Do not stop taking or using the antibiotic even if your condition improves.  Take over-the-counter and prescription medicines only as told by your health care provider. If you were prescribed pain medicine, take it at least 30 minutes before doing any wound care or as told by your health care provider. General instructions  Return to your normal activities as told by your health care provider. Ask your health care provider what activities are safe.  Do not scratch or pick at the wound.  Keep all follow-up visits as told by your health care provider. This is important.  Eat a diet that includes protein, vitamin A, vitamin C, and other nutrient-rich foods. These help the wound heal: ? Protein-rich foods include meat, dairy, beans, nuts, and other sources. ? Vitamin A-rich foods include carrots and dark green, leafy vegetables. ? Vitamin C-rich foods include citrus, tomatoes, and other fruits and vegetables. ? Nutrient-rich foods have protein, carbohydrates, fat, vitamins, or minerals. Eat a variety of healthy foods including vegetables, fruits, and whole grains. Contact a health care  provider if:  You received a tetanus shot and you have swelling, severe pain, redness, or bleeding at the injection site.  Your pain is not controlled with medicine.  You have more redness, swelling, or pain around the wound.  You have more fluid or blood coming from the wound.  Your wound feels warm to the touch.  You have pus or a bad smell coming from the wound.  You have a fever or chills.  You are nauseous or you vomit.  You are dizzy. Get help  right away if:  You have a red streak going away from your wound.  The edges of the wound open up and separate.  Your wound is bleeding and the bleeding does not stop with gentle pressure.  You have a rash.  You faint.  You have trouble breathing. This information is not intended to replace advice given to you by your health care provider. Make sure you discuss any questions you have with your health care provider. Document Released: 09/01/2008 Document Revised: 07/22/2016 Document Reviewed: 06/09/2016 Elsevier Interactive Patient Education  2017 [employer].

## 2018-10-04 ENCOUNTER — Encounter: Payer: Self-pay | Admitting: Adult Health Nurse Practitioner

## 2018-10-04 ENCOUNTER — Ambulatory Visit (INDEPENDENT_AMBULATORY_CARE_PROVIDER_SITE_OTHER): Payer: Medicare Other | Admitting: Adult Health Nurse Practitioner

## 2018-10-04 VITALS — BP 126/80 | HR 84 | Temp 98.4°F | Resp 18 | Ht 60.0 in | Wt 185.0 lb

## 2018-10-04 DIAGNOSIS — B372 Candidiasis of skin and nail: Secondary | ICD-10-CM | POA: Diagnosis not present

## 2018-10-04 DIAGNOSIS — S31829A Unspecified open wound of left buttock, initial encounter: Secondary | ICD-10-CM

## 2018-10-04 DIAGNOSIS — S91001D Unspecified open wound, right ankle, subsequent encounter: Secondary | ICD-10-CM

## 2018-10-04 DIAGNOSIS — B379 Candidiasis, unspecified: Secondary | ICD-10-CM | POA: Diagnosis not present

## 2018-10-04 MED ORDER — NYSTATIN 100000 UNIT/GM EX POWD: Freq: Three times a day (TID) | CUTANEOUS | 0 refills | Status: DC

## 2018-10-04 NOTE — Patient Instructions (Addendum)
Continue benzocaine/sugar slurry to right outer ankle and cover with non-adherent bandage. Monitor pressure point of ankle and elevate on pillows. Yeast to buttocks, apply nystatin powder 3 times a day. Keep open area clean and dry Move positions frequently to decrease pressure.      Skin Yeast Infection Skin yeast infection is a condition in which there is an overgrowth of yeast (candida) that normally lives on the skin. This condition usually occurs in areas of the skin that are constantly warm and moist, such as the armpits or the groin. What are the causes? This condition is caused by a change in the normal balance of the yeast and bacteria that live on the skin. What increases the risk? This condition is more likely to develop in:  People who are obese.  Pregnant women.  Women who take birth control pills.  People who have diabetes.  People who take antibiotic medicines.  People who take steroid medicines.  People who are malnourished.  People who have a weak defense (immune) system.  People who are 90 years of age or older.  What are the signs or symptoms? Symptoms of this condition include:  A red, swollen area of the skin.  Bumps on the skin.  Itchiness.  How is this diagnosed? This condition is diagnosed with a medical history and physical exam. Your health care provider may check for yeast by taking light scrapings of the skin to be viewed under a microscope. How is this treated? This condition is treated with medicine. Medicines may be prescribed or be available over-the-counter. The medicines may be:  Taken by mouth (orally). Applied as a cream or powder.  Follow these instructions at home:  Take or apply over-the-counter and prescription medicines only as told by your health care provider.  Eat more yogurt. This may help to keep your yeast infection from returning.  Maintain a healthy weight. If you need help losing weight, talk with your health  care provider.  Keep your skin clean and dry.  If you have diabetes, keep your blood sugar under control. Contact a health care provider if:  Your symptoms go away and then return.  Your symptoms do not get better with treatment.  Your symptoms get worse.  Your rash spreads.  You have a fever or chills.  You have new symptoms.  You have new warmth or redness of your skin. This information is not intended to replace advice given to you by your health care provider. Make sure you discuss any questions you have with your health care provider. Document Released: 08/11/2011 Document Revised: 07/19/2016 Document Reviewed: 05/27/2015 Elsevier Interactive Patient Education  2018 [employer]. Venous Ulcer A venous ulcer is a shallow sore on your lower leg that is caused by poor circulation in your veins. This condition used to be called stasis ulcer. Veins have valves that help return blood to the heart. If these valves do not work properly, it can cause blood to flow backward and to back up into the veins near the skin. When that happens, blood can pool in your lower legs. The blood can then leak out of your veins, which can irritate your skin. This may cause a break in your skin that becomes a venous ulcer. Venous ulcer is the most common type of lower leg ulcer. You may have venous ulcers on one leg or on both legs. The area where this condition most commonly develops is around the ankles. A venous ulcer may last for a long  time (chronic ulcer) or it may return repeatedly (recurrent ulcer). What are the causes? Any condition that causes poor circulation to your legs can lead to a venous ulcer. What increases the risk? This condition is more likely to develop in:  People who are 82 years of age or older.  People who are overweight.  People who are not active.  People who have had a leg ulcer in the past.  People who have clots in their lower leg veins (deep vein  thrombosis).  People who have inflammation of their leg veins (phlebitis).  Women who have given birth.  People who smoke.  What are the signs or symptoms? The most common symptom of this condition is an open sore near your ankle. Other symptoms may include:  Swelling.  Thickening of the skin.  Fluid leaking from the ulcer.  Bleeding.  Itching.  Pain and swelling that gets worse when you stand up and feels better when you raise your leg.  Blotchy skin.  Darkening of the skin.  How is this diagnosed? Your health care provider may suspect a venous ulcer based on your medical history and your risk factors. Your health care provider will check the skin on your legs. Other tests may be done to learn more about the ulcer and to determine the best way to treat it. Tests that may be done include:  Measuring the blood pressure in your arms and legs.  Using sound waves (ultrasound) to measure the blood flow in your leg veins.  How is this treated? You may need to try several different types of treatment to get your venous ulcer to heal. Healing may take a long time. Treatment may include:  Keeping your leg raised (elevated).  Wearing a type of bandage or stocking to compress the veins of your leg (compression therapy). Venous wounds are not likely to heal or to stay healed without compression.  Taking medicines to improve blood flow.  Taking antibiotic medicines to treat infection.  Cleaning your ulcer and removing any dead tissue from the wound (debridement).  Placing various types of medicated bandage (dressings) or medicated wraps on your ulcer. This helps the ulcer to heal and helps to prevent infection.  Surgery is sometimes needed to close the wound using a piece of skin taken from another area of your body (graft). You may need surgery if other treatments are not working or if your ulcer is very deep. Follow these instructions at home: Wound care  Follow instructions  from your health care provider about: ? How to take care of your wound. ? When and how you should change your bandage (dressing). ? When you should remove your dressing. If your dressing is dry and sticks to your leg when you try to remove it, moisten or wet the dressing with saline solution or water so that the dressing can be removed without harming your skin or wound tissue.  Check your wound every day for signs of infection. Have a caregiver do this for you if you are not able to do it yourself. Check for: ? More redness, swelling, or pain. ? More fluid or blood. ? Pus, warmth, or a bad smell. Medicines  Take over-the-counter and prescription medicines only as told by your health care provider.  If you were prescribed an antibiotic medicine, take it or apply it as told by your health care provider. Do not stop taking or using the antibiotic even if your condition improves. Activity  Do not stand or  sit in one position for a long period of time. Rest with your legs raised during the day. If possible, keep your legs above your heart for 30 minutes, 3-4 times a day, or as told by your health care provider.  Do not sit with your legs crossed.  Walk often to increase the blood flow in your legs.Ask your health care provider what level of activity is safe for you.  If you are taking a long ride in a car or plane, take a break to walk around at least once every two hours, or as often as your health care provider recommends. Ask your health care provider if you should take aspirin before long trips. General instructions   Wear elastic stockings, compression stockings, or support hose as told by your health care provider. This is very important.  Raise the foot of your bed as told by your health care provider.  Do not smoke.  Keep all follow-up visits as told by your health care provider. This is important. Contact a health care provider if:  You have a fever.  Your ulcer is getting  larger or is not healing.  Your pain gets worse.  You have more redness or swelling around your ulcer.  You have more fluid, blood, or pus coming from your ulcer after it has been cleaned by you or your health care provider.  You have warmth or a bad smell coming from your ulcer. This information is not intended to replace advice given to you by your health care provider. Make sure you discuss any questions you have with your health care provider. Document Released: 08/18/2001 Document Revised: 04/30/2016 Document Reviewed: 04/03/2015 Elsevier Interactive Patient Education  [employer].

## 2018-10-04 NOTE — Progress Notes (Signed)
Assessment and Plan:  Alyssa Schmitt was seen today for other.  Diagnoses and all orders for this visit:  Open wound of ankle with complication, right, subsequent encounter  Continue benzocaine sugar slurry and cover BID.  Monitor surrounding skin.  Consider leaving off plastic wrap for periods during the day Continue monitoring area, contact office with new or worsening symptoms Place pillow under right leg to prevent rubbing or contact with wound  Candidiasis of skin: intergluteal cleft -     nystatin (NYSTATIN) powder; Apply topically 3 (three) times daily to intergluteal cleft. Recently completed antibiotic course, likely from this Denies any oral or vaginal symptoms  Intertriginous candidiasis -     nystatin (NYSTATIN) powder; Apply topically 3 (three) times daily to affected area.  Wound of left buttock May use slurry on this cover.   Keep area clean and dry Move frequently and change positions throughout the day Discussed keeping pressure off of left buttock  Further disposition pending results of labs. Discussed med's effects and SE's.   Over 30 minutes of exam, counseling, chart review, and critical decision making was performed.   Future Appointments  Date Time Provider [place]  12/21/2018  3:00 PM Unk Pinto, MD [facility] None    ------------------------------------------------------------------------------------------------------------------   HPI 84 y.o.female presents for sore area to her left buttock about two weeks ago.  She reports that her daughter put some neosporin on there area that has not really helped.  She reports it is very itchy.  She has noted a scant amount of blood from the area on her under garments.  She also present with existing wound to right lateral ankle.  Reports her daughter is helping to care for this and she is concerned that the surrounding skin is becoming increasingly red.  Patient denies any pain or discomfort from this.  The  patient is alert and oriented and is accompanied by a friend to the appointment.  She is in a wheelchair for transportation only and does ambulate around her home with walker.   Past Medical History:  Diagnosis Date  . COPD (chronic obstructive pulmonary disease) ([place])   . GERD (gastroesophageal reflux disease)   . Hyperlipidemia   . Hypertension   . Osteoporosis   . Prediabetes   . Vitamin D deficiency      Allergies  Allergen Reactions  . Aspirin Swelling  . Celebrex [Celecoxib] Other (See Comments)    "my body lit up"  . Fosamax [Alendronate Sodium] Other (See Comments)    unknown  . Keflex [Cephalexin] Other (See Comments)    unknown  . Penicillins Other (See Comments)    unknown  . Prednisone Itching  . Prevacid [Lansoprazole] Other (See Comments)    unknown  . Sulfa Antibiotics Other (See Comments)    unknown  . Tetanus Toxoids Other (See Comments)    unknown  . Tetracyclines & Related Nausea Only    Nausea     Current Outpatient Medications on File Prior to Visit  Medication Sig  . acetaminophen (TYLENOL) 325 MG tablet Take 2 tablets (650 mg total) by mouth every 6 (six) hours as needed (prn TEMP>38.1 [place]).  . busPIRone (BUSPAR) 10 MG tablet TAKE 1/2 - 1 TABLET 3 TIMES A DAY AS NEEDED FOR ANXIETY  . calcium-vitamin D (OSCAL WITH D) 250-125 MG-UNIT per tablet Take 1 tablet by mouth daily.  . Cholecalciferol (VITAMIN D3) 2000 UNITS TABS Take 4 tablets by mouth daily.   . citalopram (CELEXA) 20 MG tablet TAKE 1/2-1  TABLET BY MOUTH DAILY  . fexofenadine (ALLEGRA) 180 MG tablet Take 180 mg by mouth daily.   [name] Kitchen gabapentin (NEURONTIN) 100 MG capsule Take 1 capsule (100 mg total) by mouth 3 (three) times daily.  . hydrochlorothiazide (HYDRODIURIL) 25 MG tablet Take 1 tablet (25 mg total) by mouth daily.  [name] Kitchen levothyroxine (SYNTHROID, LEVOTHROID) 50 MCG tablet TAKE 1 TABLET BY MOUTH DAILY  . magnesium oxide (MAG-OX) 400 (241.3 MG) MG tablet Take 1 tablet (400 mg total)  by mouth 2 (two) times daily.  . Multiple Vitamin (MULITIVITAMIN WITH MINERALS) TABS Take 1 tablet by mouth daily.  [name] Kitchen omeprazole (PRILOSEC) 40 MG capsule TAKE ONE CAPSULE BY MOUTH EVERY DAY  . Probiotic Product (PROBIOTIC FORMULA PO) Take 1 tablet by mouth daily.   [name] Kitchen terazosin (HYTRIN) 10 MG capsule TAKE 1 CAPSULE (10 MG TOTAL) BY MOUTH DAILY.  [name] Kitchen triamcinolone ointment (KENALOG) 0.5 % Apply 1 application topically 2 (two) times daily. Apply sparingly to rash  2 x /day   No current facility-administered medications on file prior to visit.     ROS: Review of Systems  Constitutional: Negative for chills, diaphoresis, fever, malaise/fatigue and weight loss.  Eyes: Negative for blurred vision.  Respiratory: Negative for cough, shortness of breath and wheezing.   Cardiovascular: Negative for chest pain, palpitations, orthopnea and leg swelling.  Gastrointestinal: Negative for abdominal pain, heartburn, nausea and vomiting.  Genitourinary: Negative for dysuria, frequency and urgency.  Musculoskeletal: Negative for falls and myalgias.  Skin: Positive for itching.       Wound to right lateral ankle, covered with dressing Left buttock, sore area with itching.  Neurological: Negative for tingling and sensory change.    Physical Exam:  BP 126/80   Pulse 84   Temp 98.4 F (36.9 C)   Resp 18   Ht 5' (1.524 m)   Wt 185 lb (83.9 kg)   SpO2 94%   BMI 36.13 kg/m   General Appearance: Well nourished, in no apparent distress. Eyes: PERRLA, EOMs, conjunctiva no swelling or erythema Sinuses: No Frontal/maxillary tenderness ENT/Mouth: Ext aud canals clear, TMs without erythema, bulging. No erythema, swelling, or exudate on post pharynx.  Tonsils not swollen or erythematous. Hearing normal.  Neck: Supple, thyroid normal.  Respiratory: Respiratory effort normal, BS equal bilaterally without rales, rhonchi, wheezing or stridor.  Cardio: RRR with no MRGs. Brisk peripheral pulses without edema.   Abdomen: Soft, + BS.  Non tender, no guarding, rebound, hernias, masses. Lymphatics: Non tender without lymphadenopathy.  Musculoskeletal: Full ROM, 5/5 strength, normal gait.  Skin: Warm, dry without rashes, lesions, ecchymosis. Left, gluteal fold, 2cm circumerfiencial with exudate noted with areas of excoriation.  There is white exudate noted in gluteal folds.  Left lateral ankle, venus stais not with 2cm open area covered with benzocaine slurry. Nonadherant pad overtop with plastic wrap holding in place. Neuro: Cranial nerves intact. Normal muscle tone, no cerebellar symptoms. Sensation intact.  Psych: Awake and oriented X 3, normal affect, Insight and Judgment appropriate.     Garnet Sierras, NP 5:18 PM [facility]

## 2018-10-05 ENCOUNTER — Encounter: Payer: Self-pay | Admitting: Adult Health Nurse Practitioner

## 2018-10-05 DIAGNOSIS — B372 Candidiasis of skin and nail: Secondary | ICD-10-CM | POA: Insufficient documentation

## 2018-10-05 DIAGNOSIS — S31829A Unspecified open wound of left buttock, initial encounter: Secondary | ICD-10-CM | POA: Insufficient documentation

## 2018-10-05 DIAGNOSIS — S31811A Laceration without foreign body of right buttock, initial encounter: Secondary | ICD-10-CM | POA: Insufficient documentation

## 2018-10-15 ENCOUNTER — Other Ambulatory Visit: Payer: Self-pay | Admitting: Adult Health

## 2018-10-26 ENCOUNTER — Other Ambulatory Visit: Payer: Self-pay | Admitting: Internal Medicine

## 2018-10-27 DIAGNOSIS — I739 Peripheral vascular disease, unspecified: Secondary | ICD-10-CM | POA: Diagnosis not present

## 2018-10-27 DIAGNOSIS — L603 Nail dystrophy: Secondary | ICD-10-CM | POA: Diagnosis not present

## 2018-11-07 ENCOUNTER — Other Ambulatory Visit: Payer: Self-pay | Admitting: Internal Medicine

## 2018-11-10 ENCOUNTER — Other Ambulatory Visit: Payer: Self-pay | Admitting: Internal Medicine

## 2018-11-10 MED ORDER — NEOMYCIN-POLYMYXIN-DEXAMETH 3.5-10000-0.1 OP SUSP: OPHTHALMIC | 0 refills | Status: DC

## 2018-11-11 ENCOUNTER — Other Ambulatory Visit: Payer: Self-pay | Admitting: Internal Medicine

## 2018-11-11 MED ORDER — TOBRAMYCIN-DEXAMETHASONE 0.3-0.1 % OP SUSP: OPHTHALMIC | 0 refills | Status: DC

## 2018-11-14 ENCOUNTER — Encounter: Payer: Self-pay | Admitting: *Deleted

## 2018-11-15 ENCOUNTER — Encounter: Payer: Self-pay | Admitting: Internal Medicine

## 2018-11-23 ENCOUNTER — Encounter: Payer: Self-pay | Admitting: Internal Medicine

## 2018-12-15 DIAGNOSIS — H04123 Dry eye syndrome of bilateral lacrimal glands: Secondary | ICD-10-CM | POA: Diagnosis not present

## 2018-12-15 DIAGNOSIS — H01003 Unspecified blepharitis right eye, unspecified eyelid: Secondary | ICD-10-CM | POA: Diagnosis not present

## 2018-12-21 ENCOUNTER — Ambulatory Visit (INDEPENDENT_AMBULATORY_CARE_PROVIDER_SITE_OTHER): Payer: Medicare Other | Admitting: Internal Medicine

## 2018-12-21 ENCOUNTER — Encounter: Payer: Self-pay | Admitting: Internal Medicine

## 2018-12-21 VITALS — BP 126/74 | HR 92 | Temp 97.1°F | Resp 16 | Ht 60.0 in | Wt 184.3 lb

## 2018-12-21 DIAGNOSIS — N3000 Acute cystitis without hematuria: Secondary | ICD-10-CM

## 2018-12-21 DIAGNOSIS — Z0000 Encounter for general adult medical examination without abnormal findings: Secondary | ICD-10-CM | POA: Diagnosis not present

## 2018-12-21 DIAGNOSIS — R7309 Other abnormal glucose: Secondary | ICD-10-CM

## 2018-12-21 DIAGNOSIS — I700 Atherosclerosis of aorta: Secondary | ICD-10-CM

## 2018-12-21 DIAGNOSIS — Z79899 Other long term (current) drug therapy: Secondary | ICD-10-CM

## 2018-12-21 DIAGNOSIS — F325 Major depressive disorder, single episode, in full remission: Secondary | ICD-10-CM

## 2018-12-21 DIAGNOSIS — Z136 Encounter for screening for cardiovascular disorders: Secondary | ICD-10-CM | POA: Diagnosis not present

## 2018-12-21 DIAGNOSIS — R7303 Prediabetes: Secondary | ICD-10-CM | POA: Diagnosis not present

## 2018-12-21 DIAGNOSIS — E038 Other specified hypothyroidism: Secondary | ICD-10-CM

## 2018-12-21 DIAGNOSIS — E559 Vitamin D deficiency, unspecified: Secondary | ICD-10-CM | POA: Diagnosis not present

## 2018-12-21 DIAGNOSIS — I10 Essential (primary) hypertension: Secondary | ICD-10-CM

## 2018-12-21 DIAGNOSIS — M810 Age-related osteoporosis without current pathological fracture: Secondary | ICD-10-CM

## 2018-12-21 DIAGNOSIS — Z6837 Body mass index (BMI) 37.0-37.9, adult: Secondary | ICD-10-CM

## 2018-12-21 DIAGNOSIS — Z1212 Encounter for screening for malignant neoplasm of rectum: Secondary | ICD-10-CM

## 2018-12-21 DIAGNOSIS — E782 Mixed hyperlipidemia: Secondary | ICD-10-CM | POA: Diagnosis not present

## 2018-12-21 DIAGNOSIS — Z0001 Encounter for general adult medical examination with abnormal findings: Secondary | ICD-10-CM

## 2018-12-21 DIAGNOSIS — Z1211 Encounter for screening for malignant neoplasm of colon: Secondary | ICD-10-CM

## 2018-12-21 NOTE — Progress Notes (Signed)
ADULT & ADOLESCENT INTERNAL MEDICINE Unk Pinto, M.D.     [name], P.A.-C [name], [place] [address] [place], [place] [address] Telephone [phone] Telefax [phone] Annual Screening/Preventative Visit & Comprehensive Evaluation &  Examination     This very nice 85 y.o. WWF presents for a Screening /Preventative Visit & comprehensive evaluation and management of multiple medical co-morbidities.  Patient has been followed for HTN, HLD, Prediabetes  and Vitamin D Deficiency. Patient has GERD controlled with her meds.       HTN predates circa 1990. Patient's BP has been controlled at home and patient denies any cardiac symptoms as chest pain, palpitations, shortness of breath, dizziness or ankle swelling. Today's BP is at goal - 126/74      Patient's hyperlipidemia is controlled with diet and medications. Patient denies myalgias or other medication SE's. Last lipids were at goal: Lab Results  Component Value Date   CHOL 177 05/25/2018   HDL 61 05/25/2018   LDLCALC 95 05/25/2018   TRIG 112 05/25/2018   CHOLHDL 2.9 05/25/2018      Patient has Morbid Obhesity (BMI 36+) and  hx/o prediabetes (A1c 5.7% / 2014) and patient denies reactive hypoglycemic symptoms, visual blurring, diabetic polys or paresthesias. Last A1c was Normal & at goal: Lab Results  Component Value Date   HGBA1C 5.3 05/25/2018      Patient was initiated on Thyroid Replacement in 2011.         Finally, patient has history of Vitamin D Deficiency and last Vitamin D was at goal: Lab Results  Component Value Date   VD25OH 73 05/25/2018   Current Outpatient Medications on File Prior to Visit  Medication Sig  . acetaminophen (TYLENOL) 325 MG tablet Take 2 tablets (650 mg total) by mouth every 6 (six) hours as needed (prn TEMP>38.1 Celsius).  . busPIRone (BUSPAR) 10 MG tablet TAKE 1/2 - 1 TABLET 3 TIMES A DAY AS NEEDED FOR ANXIETY  .  calcium-vitamin D (OSCAL WITH D) 250-125 MG-UNIT per tablet Take 1 tablet by mouth daily.  . Cholecalciferol (VITAMIN D3) 2000 UNITS TABS Take 4 tablets by mouth daily.   . citalopram (CELEXA) 20 MG tablet TAKE 1/2-1 TABLET BY MOUTH DAILY  . fexofenadine (ALLEGRA) 180 MG tablet Take 180 mg by mouth daily.   [name] Kitchen gabapentin (NEURONTIN) 100 MG capsule Take 1 capsule (100 mg total) by mouth 3 (three) times daily.  . hydrochlorothiazide (HYDRODIURIL) 25 MG tablet TAKE 1 TABLET BY MOUTH EVERY DAY  . levothyroxine (SYNTHROID, LEVOTHROID) 50 MCG tablet TAKE 1 TABLET BY MOUTH DAILY  . magnesium oxide (MAG-OX) 400 (241.3 MG) MG tablet Take 1 tablet (400 mg total) by mouth 2 (two) times daily.  . Multiple Vitamin (MULITIVITAMIN WITH MINERALS) TABS Take 1 tablet by mouth daily.  [name] Kitchen neomycin-polymyxin b-dexamethasone (MAXITROL) 3.5-10000-0.1 OINT   . nystatin (NYSTATIN) powder Apply topically 3 (three) times daily.  [name] Kitchen omeprazole (PRILOSEC) 40 MG capsule TAKE ONE CAPSULE BY MOUTH EVERY DAY  . Probiotic Product (PROBIOTIC FORMULA PO) Take 1 tablet by mouth daily.   [name] Kitchen terazosin (HYTRIN) 10 MG capsule TAKE 1 CAPSULE BY MOUTH EVERY DAY  . tobramycin-dexamethasone (TOBRADEX) ophthalmic solution Instill 1 to 2 drops to the affected eye  3 to 4 x /day   No current facility-administered medications on file prior to visit.    Allergies  Allergen Reactions  . Aspirin Swelling  . Celebrex [Celecoxib] Other (See Comments)    "my body  lit up"  . Fosamax [Alendronate Sodium] Other (See Comments)    unknown  . Keflex [Cephalexin] Other (See Comments)    unknown  . Penicillins Other (See Comments)    unknown  . Prednisone Itching  . Prevacid [Lansoprazole] Other (See Comments)    unknown  . Sulfa Antibiotics Other (See Comments)    unknown  . Tetanus Toxoids Other (See Comments)    unknown  . Tetracyclines & Related Nausea Only    Nausea    Past Medical History:  Diagnosis Date  . COPD (chronic obstructive  pulmonary disease) ([place])   . GERD (gastroesophageal reflux disease)   . Hyperlipidemia   . Hypertension   . Osteoporosis   . Prediabetes   . Vitamin D deficiency    Health Maintenance  Topic Date Due  . MAMMOGRAM  07/28/2019  . INFLUENZA VACCINE  Completed  . DEXA SCAN  Completed  . PNA vac Low Risk Adult  Completed   Immunization History  Administered Date(s) Administered  . Influenza, High Dose Seasonal PF 09/05/2014, 09/17/2015, 08/25/2016, 09/22/2017, 08/25/2018  . Pneumococcal Conjugate-13 11/20/2015  . Pneumococcal Polysaccharide-23 12/18/2007  . Zoster 07/25/2013   Last Colon - 10/17/2010 Dr Watt Climes & patient declined recommended f/u's.  Last MGM - 07/27/2018  Past Surgical History:  Procedure Laterality Date  . TONSILLECTOMY     childhood  . VESICOVAGINAL FISTULA CLOSURE W/ TAH     Family History  Problem Relation Age of Onset  . Heart disease Mother   . Other Mother        VARICOSE VEINS  . Diabetes Father   . Other Brother        VARICOSE VEINS   Social History   Tobacco Use  . Smoking status: Never Smoker  . Smokeless tobacco: Never Used  Substance Use Topics  . Alcohol use: No  . Drug use: No    ROS Constitutional: Denies fever, chills, weight loss/gain, headaches, insomnia,  night sweats, and change in appetite. Does c/o fatigue. Eyes: Denies redness, blurred vision, diplopia, discharge, itchy, watery eyes.  ENT: Denies discharge, congestion, post nasal drip, epistaxis, sore throat, earache, hearing loss, dental pain, Tinnitus, Vertigo, Sinus pain, snoring.  Cardio: Denies chest pain, palpitations, irregular heartbeat, syncope, dyspnea, diaphoresis, orthopnea, PND, claudication, edema Respiratory: denies cough, dyspnea, DOE, pleurisy, hoarseness, laryngitis, wheezing.  Gastrointestinal: Denies dysphagia, heartburn, reflux, water brash, pain, cramps, nausea, vomiting, bloating, diarrhea, constipation, hematemesis, melena, hematochezia, jaundice,  hemorrhoids Genitourinary: Denies dysuria, frequency, urgency, nocturia, hesitancy, discharge, hematuria, flank pain Breast: Breast lumps, nipple discharge, bleeding.  Musculoskeletal: Patient dependent on a wheelchair. Denies falls. Skin: Denies puritis, rash, hives, warts, acne, eczema, changing in skin lesion Neuro: No weakness, tremor, incoordination, spasms, paresthesia, pain Psychiatric: Denies confusion, memory loss, sensory loss. Denies Depression. Endocrine: Denies change in weight, skin, hair change, nocturia, and paresthesia, diabetic polys, visual blurring, hyper / hypo glycemic episodes.  Heme/Lymph: No excessive bleeding, bruising, enlarged lymph nodes.  Physical Exam  BP 126/74   Pulse 92   Temp (!) 97.1 F (36.2 C)   Resp 16   Ht 5' (1.524 m)   Wt 184 lb 4.8 oz (83.6 kg)   BMI 35.99 kg/m   General Appearance: Over nourished, well groomed and in no apparent distress.  Eyes: PERRLA, EOMs, conjunctiva no swelling or erythema, normal fundi and vessels. Sinuses: No frontal/maxillary tenderness ENT/Mouth: EACs patent / TMs  nl. Nares clear without erythema, swelling, mucoid exudates. Oral hygiene is good. No erythema, swelling, or exudate.  Tongue normal, non-obstructing. Tonsils not swollen or erythematous. Hearing normal.  Neck: Supple, thyroid not palpable. No bruits, nodes or JVD. Respiratory: Respiratory effort normal.  BS equal and clear bilateral without rales, rhonci, wheezing or stridor. Cardio: Heart sounds are normal with regular rate and rhythm and no murmurs, rubs or gallops. Peripheral pulses are normal and equal bilaterally without edema. No aortic or femoral bruits. Chest:  Kyphosis/gibbous deformity and symmetric with normal excursions and percussion. Breasts: Symmetric, without lumps, nipple discharge, retractions, or fibrocystic changes.  Abdomen: Flat, soft with bowel sounds active. Nontender, no guarding, rebound, hernias, masses, or organomegaly.   Lymphatics: Non tender without lymphadenopathy.  Genitourinary:  Musculoskeletal: Full ROM all peripheral extremities, generalized decrease in muscle power, tone & bulk. She is in a wheelchair. Skin: Warm and dry without rashes, lesions, cyanosis, clubbing or  ecchymosis.  Neuro: Cranial nerves intact, reflexes equal bilaterally. Normal muscle tone, no cerebellar symptoms. Sensation intact.  Pysch: Alert and oriented X 3, normal affect, Insight and Judgment fair.   Assessment and Plan  1. Annual Preventative Screening Examination  2. Essential hypertension  - EKG 12-Lead - Urinalysis, Routine w reflex microscopic - Microalbumin / creatinine urine ratio - CBC with Differential/Platelet - COMPLETE METABOLIC PANEL WITH GFR - Magnesium - TSH  3. Hyperlipidemia, mixed  - EKG 12-Lead - Lipid panel - TSH  4. Abnormal glucose  - EKG 12-Lead - Hemoglobin A1c - Insulin, random  5. Vitamin D deficiency  - VITAMIN D 25 Hydroxy (Vit-D Deficiency, Fractures)  6. Hypothyroidism  - TSH  7. Osteoporosis,  - COMPLETE METABOLIC PANEL WITH GFR  8. Class 2 severe obesity due to excess calories with serious comorbidity and body mass index (BMI) of 37.0 to 37.9 in adult [facility])  - Lipid panel - Hemoglobin A1c  9. Aortic atherosclerosis (HCC)  - EKG 12-Lead  10. Prediabetes  - EKG 12-Lead - Hemoglobin A1c - Insulin, random  11. Screening for colorectal cancer  - POC Hemoccult Bld/Stl  12. Screening for ischemic heart disease  - EKG 12-Lead  13. Depression, major, in remission ([place])  - Hemoglobin A1c  14. Medication management  - Urinalysis, Routine w reflex microscopic - Microalbumin / creatinine urine ratio - CBC with Differential/Platelet - COMPLETE METABOLIC PANEL WITH GFR - Magnesium - Lipid panel - TSH - Hemoglobin A1c - Insulin, random - VITAMIN D 25 Hydroxyl  15. Acute cystitis without hematuria  - Urine Culture       Patient was counseled in  prudent diet to achieve/maintain BMI less than 25 for weight control, BP monitoring, regular exercise and medications. Discussed med's effects and SE's. Screening labs and tests as requested with regular follow-up as recommended. Over 40 minutes of exam, counseling, chart review and high complex critical decision making was performed.

## 2018-12-21 NOTE — Patient Instructions (Signed)
We Do NOT Approve of  [facility] [place] Soliciting Our Patients  To Do Home Visits & We Do NOT Approve of [employer] > > > > > > > > > > > > > > > > > > > > > > > > > > > > > > > > > > > > > > >  Preventive Care for Adults  A healthy lifestyle and preventive care can promote health and wellness. Preventive health guidelines for women include the following key practices.  A routine yearly physical is a good way to check with your health care provider about your health and preventive screening. It is a chance to share any concerns and updates on your health and to receive a thorough exam.  Visit your dentist for a routine exam and preventive care every 6 months. Brush your teeth twice a day and floss once a day. Good oral hygiene prevents tooth decay and gum disease.  The frequency of eye exams is based on your age, health, family medical history, use of contact lenses, and other factors. Follow your health care provider's recommendations for frequency of eye exams.  Eat a healthy diet. Foods like vegetables, fruits, whole grains, low-fat dairy products, and lean protein foods contain the nutrients you need without too many calories. Decrease your intake of foods high in solid fats, added sugars, and salt. Eat the right amount of calories for you. Get information about a proper diet from your health care provider, if necessary.  Regular physical exercise is one of the most important things you can do for your health. Most adults should get at least 150 minutes of moderate-intensity exercise (any activity that increases your heart rate and causes you to sweat) each week. In addition, most adults need muscle-strengthening exercises on 2 or more days a week.  Maintain a healthy weight. The body mass index (BMI) is a screening tool to identify possible weight problems. It provides an estimate of body fat based on height and weight. Your health care provider can find your BMI  and can help you achieve or maintain a healthy weight. For adults 20 years and older:  A BMI below 18.5 is considered underweight.  A BMI of 18.5 to 24.9 is normal.  A BMI of 25 to 29.9 is considered overweight.  A BMI of 30 and above is considered obese.  Maintain normal blood lipids and cholesterol levels by exercising and minimizing your intake of saturated fat. Eat a balanced diet with plenty of fruit and vegetables. If your lipid or cholesterol levels are high, you are over 50, or you are at high risk for heart disease, you may need your cholesterol levels checked more frequently. Ongoing high lipid and cholesterol levels should be treated with medicines if diet and exercise are not working.  If you smoke, find out from your health care provider how to quit. If you do not use tobacco, do not start.  Lung cancer screening is recommended for adults aged 55-80 years who are at high risk for developing lung cancer because of a history of smoking. A yearly low-dose CT scan of the lungs is recommended for people who have at least a 30-pack-year history of smoking and are a current smoker or have quit within the past 15 years. A pack year of smoking is smoking an average of 1 pack of cigarettes a day for 1 year (for example: 1 pack a day for 30 years or 2 packs a day   for 15 years). Yearly screening should continue until the smoker has stopped smoking for at least 15 years. Yearly screening should be stopped for people who develop a health problem that would prevent them from having lung cancer treatment.  Avoid use of street drugs. Do not share needles with anyone. Ask for help if you need support or instructions about stopping the use of drugs.  High blood pressure causes heart disease and increases the risk of stroke.  Ongoing high blood pressure should be treated with medicines if weight loss and exercise do not work.  If you are 68-67 years old, ask your health care provider if you should take  aspirin to prevent strokes.  Diabetes screening involves taking a blood sample to check your fasting blood sugar level. This should be done once every 3 years, after age 23, if you are within normal weight and without risk factors for diabetes. Testing should be considered at a younger age or be carried out more frequently if you are overweight and have at least 1 risk factor for diabetes.  Breast cancer screening is essential preventive care for women. You should practice "breast self-awareness." This means understanding the normal appearance and feel of your breasts and may include breast self-examination. Any changes detected, no matter how small, should be reported to a health care provider. Women in their 50s and 30s should have a clinical breast exam (CBE) by a health care provider as part of a regular health exam every 1 to 3 years. After age 33, women should have a CBE every year. Starting at age 72, women should consider having a mammogram (breast X-ray test) every year. Women who have a family history of breast cancer should talk to their health care provider about genetic screening. Women at a high risk of breast cancer should talk to their health care providers about having an MRI and a mammogram every year.  Breast cancer gene (BRCA)-related cancer risk assessment is recommended for women who have family members with BRCA-related cancers. BRCA-related cancers include breast, ovarian, tubal, and peritoneal cancers. Having family members with these cancers may be associated with an increased risk for harmful changes (mutations) in the breast cancer genes BRCA1 and BRCA2. Results of the assessment will determine the need for genetic counseling and BRCA1 and BRCA2 testing.  Routine pelvic exams to screen for cancer are no longer recommended for nonpregnant women who are considered low risk for cancer of the pelvic organs (ovaries, uterus, and vagina) and who do not have symptoms. Ask your health  care provider if a screening pelvic exam is right for you.  If you have had past treatment for cervical cancer or a condition that could lead to cancer, you need Pap tests and screening for cancer for at least 20 years after your treatment. If Pap tests have been discontinued, your risk factors (such as having a new sexual partner) need to be reassessed to determine if screening should be resumed. Some women have medical problems that increase the chance of getting cervical cancer. In these cases, your health care provider may recommend more frequent screening and Pap tests.    Colorectal cancer can be detected and often prevented. Most routine colorectal cancer screening begins at the age of 11 years and continues through age 68 years. However, your health care provider may recommend screening at an earlier age if you have risk factors for colon cancer. On a yearly basis, your health care provider may provide home test kits to check  for hidden blood in the stool. Use of a small camera at the end of a tube, to directly examine the colon (sigmoidoscopy or colonoscopy), can detect the earliest forms of colorectal cancer. Talk to your health care provider about this at age 50, when routine screening begins.  Direct exam of the colon should be repeated every 5-10 years through age 75 years, unless early forms of pre-cancerous polyps or small growths are found.  Osteoporosis is a disease in which the bones lose minerals and strength with aging. This can result in serious bone fractures or breaks. The risk of osteoporosis can be identified using a bone density scan. Women ages 65 years and over and women at risk for fractures or osteoporosis should discuss screening with their health care providers. Ask your health care provider whether you should take a calcium supplement or vitamin D to reduce the rate of osteoporosis.  Menopause can be associated with physical symptoms and risks. Hormone replacement therapy  is available to decrease symptoms and risks. You should talk to your health care provider about whether hormone replacement therapy is right for you.  Use sunscreen. Apply sunscreen liberally and repeatedly throughout the day. You should seek shade when your shadow is shorter than you. Protect yourself by wearing long sleeves, pants, a wide-brimmed hat, and sunglasses year round, whenever you are outdoors.  Once a month, do a whole body skin exam, using a mirror to look at the skin on your back. Tell your health care provider of new moles, moles that have irregular borders, moles that are larger than a pencil eraser, or moles that have changed in shape or color.  Stay current with required vaccines (immunizations).  Influenza vaccine. All adults should be immunized every year.  Tetanus, diphtheria, and acellular pertussis (Td, Tdap) vaccine. Pregnant women should receive 1 dose of Tdap vaccine during each pregnancy. The dose should be obtained regardless of the length of time since the last dose. Immunization is preferred during the 27th-36th week of gestation. An adult who has not previously received Tdap or who does not know her vaccine status should receive 1 dose of Tdap. This initial dose should be followed by tetanus and diphtheria toxoids (Td) booster doses every 10 years. Adults with an unknown or incomplete history of completing a 3-dose immunization series with Td-containing vaccines should begin or complete a primary immunization series including a Tdap dose. Adults should receive a Td booster every 10 years.    Zoster vaccine. One dose is recommended for adults aged 60 years or older unless certain conditions are present.    Pneumococcal 13-valent conjugate (PCV13) vaccine. When indicated, a person who is uncertain of her immunization history and has no record of immunization should receive the PCV13 vaccine. An adult aged 19 years or older who has certain medical conditions and has not  been previously immunized should receive 1 dose of PCV13 vaccine. This PCV13 should be followed with a dose of pneumococcal polysaccharide (PPSV23) vaccine. The PPSV23 vaccine dose should be obtained at least 1 or more year(s) after the dose of PCV13 vaccine. An adult aged 19 years or older who has certain medical conditions and previously received 1 or more doses of PPSV23 vaccine should receive 1 dose of PCV13. The PCV13 vaccine dose should be obtained 1 or more years after the last PPSV23 vaccine dose.    Pneumococcal polysaccharide (PPSV23) vaccine. When PCV13 is also indicated, PCV13 should be obtained first. All adults aged 65 years and older should   be immunized. An adult younger than age 65 years who has certain medical conditions should be immunized. Any person who resides in a nursing home or long-term care facility should be immunized. An adult smoker should be immunized. People with an immunocompromised condition and certain other conditions should receive both PCV13 and PPSV23 vaccines. People with human immunodeficiency virus (HIV) infection should be immunized as soon as possible after diagnosis. Immunization during chemotherapy or radiation therapy should be avoided. Routine use of PPSV23 vaccine is not recommended for American Indians, [place] Natives, or people younger than 65 years unless there are medical conditions that require PPSV23 vaccine. When indicated, people who have unknown immunization and have no record of immunization should receive PPSV23 vaccine. One-time revaccination 5 years after the first dose of PPSV23 is recommended for people aged 19-64 years who have chronic kidney failure, nephrotic syndrome, asplenia, or immunocompromised conditions. People who received 1-2 doses of PPSV23 before age 65 years should receive another dose of PPSV23 vaccine at age 65 years or later if at least 5 years have passed since the previous dose. Doses of PPSV23 are not needed for people immunized  with PPSV23 at or after age 65 years.   Preventive Services / Frequency  Ages 65 years and over  Blood pressure check.  Lipid and cholesterol check.  Lung cancer screening. / Every year if you are aged 55-80 years and have a 30-pack-year history of smoking and currently smoke or have quit within the past 15 years. Yearly screening is stopped once you have quit smoking for at least 15 years or develop a health problem that would prevent you from having lung cancer treatment.  Clinical breast exam.** / Every year after age 40 years.   BRCA-related cancer risk assessment.** / For women who have family members with a BRCA-related cancer (breast, ovarian, tubal, or peritoneal cancers).  Mammogram.** / Every year beginning at age 40 years and continuing for as long as you are in good health. Consult with your health care provider.  Pap test.** / Every 3 years starting at age 30 years through age 65 or 70 years with 3 consecutive normal Pap tests. Testing can be stopped between 65 and 70 years with 3 consecutive normal Pap tests and no abnormal Pap or HPV tests in the past 10 years.  Fecal occult blood test (FOBT) of stool. / Every year beginning at age 50 years and continuing until age 75 years. You may not need to do this test if you get a colonoscopy every 10 years.  Flexible sigmoidoscopy or colonoscopy.** / Every 5 years for a flexible sigmoidoscopy or every 10 years for a colonoscopy beginning at age 50 years and continuing until age 75 years.  Hepatitis C blood test.** / For all people born from 1945 through 1965 and any individual with known risks for hepatitis C.  Osteoporosis screening.** / A one-time screening for women ages 65 years and over and women at risk for fractures or osteoporosis.  Skin self-exam. / Monthly.  Influenza vaccine. / Every year.  Tetanus, diphtheria, and acellular pertussis (Tdap/Td) vaccine.** / 1 dose of Td every 10 years.  Zoster vaccine.** / 1 dose  for adults aged 60 years or older.  Pneumococcal 13-valent conjugate (PCV13) vaccine.** / Consult your health care provider.  Pneumococcal polysaccharide (PPSV23) vaccine.** / 1 dose for all adults aged 65 years and older. Screening for abdominal aortic aneurysm (AAA)  by ultrasound is recommended for people who have history of high blood pressure   or who are current or former smokers. ++++++++++++++++++++ Recommend Adult Low Dose Aspirin or  coated  Aspirin 81 mg daily  To reduce risk of Colon Cancer 20 %,  Skin Cancer 26 % ,  Melanoma 46%  and  Pancreatic cancer 60% ++++++++++++++++++++ Vitamin D goal  is between 70-100.  Please make sure that you are taking your Vitamin D as directed.  It is very important as a natural anti-inflammatory  helping hair, skin, and nails, as well as reducing stroke and heart attack risk.  It helps your bones and helps with mood. It also decreases numerous cancer risks so please take it as directed.  Low Vit D is associated with a 200-300% higher risk for CANCER  and 200-300% higher risk for HEART   ATTACK  &  STROKE.   .....................................[name] Kitchen It is also associated with higher death rate at younger ages,  autoimmune diseases like Rheumatoid arthritis, Lupus, Multiple Sclerosis.    Also many other serious conditions, like depression, Alzheimer's Dementia, infertility, muscle aches, fatigue, fibromyalgia - just to name a few. ++++++++++++++++++ Recommend the book "The END of DIETING" by Dr Excell Seltzer  & the book "The END of DIABETES " by Dr Excell Seltzer At [email or web address] - get book & Audio CD's    Being diabetic has a  300% increased risk for heart attack, stroke, cancer, and alzheimer- type vascular dementia. It is very important that you work harder with diet by avoiding all foods that are white. Avoid white rice (brown & wild rice is OK), white potatoes (sweetpotatoes in moderation is OK), White bread or wheat bread or anything made out of  white flour like bagels, donuts, rolls, buns, biscuits, cakes, pastries, cookies, pizza crust, and pasta (made from white flour & egg whites) - vegetarian pasta or spinach or wheat pasta is OK. Multigrain breads like Arnold's or Pepperidge [employer], or multigrain sandwich thins or flatbreads.  Diet, exercise and weight loss can reverse and cure diabetes in the early stages.  Diet, exercise and weight loss is very important in the control and prevention of complications of diabetes which affects every system in your body, ie. Brain - dementia/stroke, eyes - glaucoma/blindness, heart - heart attack/heart failure, kidneys - dialysis, stomach - gastric paralysis, intestines - malabsorption, nerves - severe painful neuritis, circulation - gangrene & loss of a leg(s), and finally cancer and Alzheimers.    I recommend avoid fried & greasy foods,  sweets/candy, white rice (brown or wild rice or Quinoa is OK), white potatoes (sweet potatoes are OK) - anything made from white flour - bagels, doughnuts, rolls, buns, biscuits,white and wheat breads, pizza crust and traditional pasta made of white flour & egg white(vegetarian pasta or spinach or wheat pasta is OK).  Multi-grain bread is OK - like multi-grain flat bread or sandwich thins. Avoid alcohol in excess. Exercise is also important.    Eat all the vegetables you want - avoid meat, especially red meat and dairy - especially cheese.  Cheese is the most concentrated form of trans-fats which is the worst thing to clog up our arteries. Veggie cheese is OK which can be found in the fresh produce section at [employer] or [employer] or [employer]  +++++++++++++++++++ DASH Eating Plan  DASH stands for "Dietary Approaches to Stop Hypertension."   The DASH eating plan is a healthy eating plan that has been shown to reduce high blood pressure (hypertension). Additional health benefits may include reducing the risk of type 2 diabetes mellitus, heart disease,  and stroke. The  DASH eating plan may also help with weight loss. WHAT DO I NEED TO KNOW ABOUT THE DASH EATING PLAN? For the DASH eating plan, you will follow these general guidelines:  Choose foods with a percent daily value for sodium of less than 5% (as listed on the food label).  Use salt-free seasonings or herbs instead of table salt or sea salt.  Check with your health care provider or pharmacist before using salt substitutes.  Eat lower-sodium products, often labeled as "lower sodium" or "no salt added."  Eat fresh foods.  Eat more vegetables, fruits, and low-fat dairy products.  Choose whole grains. Look for the word "whole" as the first word in the ingredient list.  Choose fish   Limit sweets, desserts, sugars, and sugary drinks.  Choose heart-healthy fats.  Eat veggie cheese   Eat more home-cooked food and less restaurant, buffet, and fast food.  Limit fried foods.  Cook foods using methods other than frying.  Limit canned vegetables. If you do use them, rinse them well to decrease the sodium.  When eating at a restaurant, ask that your food be prepared with less salt, or no salt if possible.                      WHAT FOODS CAN I EAT? Read Dr Fara Olden Fuhrman's books on The End of Dieting & The End of Diabetes  Grains Whole grain or whole wheat bread. Brown rice. Whole grain or whole wheat pasta. Quinoa, bulgur, and whole grain cereals. Low-sodium cereals. Corn or whole wheat flour tortillas. Whole grain cornbread. Whole grain crackers. Low-sodium crackers.  Vegetables Fresh or frozen vegetables (raw, steamed, roasted, or grilled). Low-sodium or reduced-sodium tomato and vegetable juices. Low-sodium or reduced-sodium tomato sauce and paste. Low-sodium or reduced-sodium canned vegetables.   Fruits All fresh, canned (in natural juice), or frozen fruits.  Protein Products  All fish and seafood.  Dried beans, peas, or lentils. Unsalted nuts and seeds. Unsalted canned  beans.  Dairy Low-fat dairy products, such as skim or 1% milk, 2% or reduced-fat cheeses, low-fat ricotta or cottage cheese, or plain low-fat yogurt. Low-sodium or reduced-sodium cheeses.  Fats and Oils Tub margarines without trans fats. Light or reduced-fat mayonnaise and salad dressings (reduced sodium). Avocado. Safflower, olive, or canola oils. Natural peanut or almond butter.  Other Unsalted popcorn and pretzels. The items listed above may not be a complete list of recommended foods or beverages. Contact your dietitian for more options.  +++++++++++++++  WHAT FOODS ARE NOT RECOMMENDED? Grains/ White flour or wheat flour White bread. White pasta. White rice. Refined cornbread. Bagels and croissants. Crackers that contain trans fat.  Vegetables  Creamed or fried vegetables. Vegetables in a . Regular canned vegetables. Regular canned tomato sauce and paste. Regular tomato and vegetable juices.  Fruits Dried fruits. Canned fruit in light or heavy syrup. Fruit juice.  Meat and Other Protein Products Meat in general - RED meat & White meat.  Fatty cuts of meat. Ribs, chicken wings, all processed meats as bacon, sausage, bologna, salami, fatback, hot dogs, bratwurst and packaged luncheon meats.  Dairy Whole or 2% milk, cream, half-and-half, and cream cheese. Whole-fat or sweetened yogurt. Full-fat cheeses or blue cheese. Non-dairy creamers and whipped toppings. Processed cheese, cheese spreads, or cheese curds.  Condiments Onion and garlic salt, seasoned salt, table salt, and sea salt. Canned and packaged gravies. Worcestershire sauce. Tartar sauce. Barbecue sauce. Teriyaki sauce. Soy sauce, including reduced  sodium. Steak sauce. Fish sauce. Oyster sauce. Cocktail sauce. Horseradish. Ketchup and mustard. Meat flavorings and tenderizers. Bouillon cubes. Hot sauce. Tabasco sauce. Marinades. Taco seasonings. Relishes.  Fats and Oils Butter, stick margarine, lard, shortening and bacon  fat. Coconut, palm kernel, or palm oils. Regular salad dressings.  Pickles and olives. Salted popcorn and pretzels.  The items listed above may not be a complete list of foods and beverages to avoid.

## 2018-12-22 ENCOUNTER — Other Ambulatory Visit: Payer: Self-pay | Admitting: Internal Medicine

## 2018-12-22 DIAGNOSIS — N3000 Acute cystitis without hematuria: Secondary | ICD-10-CM

## 2018-12-22 LAB — CBC WITH DIFFERENTIAL/PLATELET
Absolute Monocytes: 621 cells/uL (ref 200–950)
Basophils Absolute: 41 cells/uL (ref 0–200)
Basophils Relative: 0.7 %
Eosinophils Absolute: 58 cells/uL (ref 15–500)
Eosinophils Relative: 1 %
HCT: 41.8 % (ref 35.0–45.0)
Hemoglobin: 14.3 g/dL (ref 11.7–15.5)
Lymphs Abs: 2047 cells/uL (ref 850–3900)
MCH: 30.7 pg (ref 27.0–33.0)
MCHC: 34.2 g/dL (ref 32.0–36.0)
MCV: 89.7 fL (ref 80.0–100.0)
MPV: 11.2 fL (ref 7.5–12.5)
Monocytes Relative: 10.7 %
Neutro Abs: 3033 cells/uL (ref 1500–7800)
Neutrophils Relative %: 52.3 %
Platelets: 191 10*3/uL (ref 140–400)
RBC: 4.66 10*6/uL (ref 3.80–5.10)
RDW: 12.7 % (ref 11.0–15.0)
Total Lymphocyte: 35.3 %
WBC: 5.8 10*3/uL (ref 3.8–10.8)

## 2018-12-22 LAB — COMPLETE METABOLIC PANEL WITH GFR
AG Ratio: 1.4 (calc) (ref 1.0–2.5)
ALT: 35 U/L — ABNORMAL HIGH (ref 6–29)
AST: 56 U/L — ABNORMAL HIGH (ref 10–35)
Albumin: 4.4 g/dL (ref 3.6–5.1)
Alkaline phosphatase (APISO): 48 U/L (ref 33–130)
BUN/Creatinine Ratio: 20 (calc) (ref 6–22)
BUN: 21 mg/dL (ref 7–25)
CO2: 27 mmol/L (ref 20–32)
Calcium: 10.1 mg/dL (ref 8.6–10.4)
Chloride: 98 mmol/L (ref 98–110)
Creat: 1.04 mg/dL — ABNORMAL HIGH (ref 0.60–0.88)
GFR, Est African American: 56 mL/min/{1.73_m2} — ABNORMAL LOW (ref >=60)
GFR, Est Non African American: 48 mL/min/{1.73_m2} — ABNORMAL LOW (ref >=60)
Globulin: 3.2 g/dL (calc) (ref 1.9–3.7)
Glucose, Bld: 87 mg/dL (ref 65–99)
Potassium: 3.6 mmol/L (ref 3.5–5.3)
Sodium: 136 mmol/L (ref 135–146)
Total Bilirubin: 0.9 mg/dL (ref 0.2–1.2)
Total Protein: 7.6 g/dL (ref 6.1–8.1)

## 2018-12-22 LAB — VITAMIN D 25 HYDROXY (VIT D DEFICIENCY, FRACTURES): Vit D, 25-Hydroxy: 79 ng/mL (ref 30–100)

## 2018-12-22 LAB — TSH: TSH: 3.84 mIU/L (ref 0.40–4.50)

## 2018-12-22 LAB — HEMOGLOBIN A1C
Hgb A1c MFr Bld: 5.4 % of total Hgb (ref <5.7)
Mean Plasma Glucose: 108 (calc)
eAG (mmol/L): 6 (calc)

## 2018-12-22 LAB — URINALYSIS, ROUTINE W REFLEX MICROSCOPIC
Bilirubin Urine: NEGATIVE
Glucose, UA: NEGATIVE
Hgb urine dipstick: NEGATIVE
Ketones, ur: NEGATIVE
Nitrite: NEGATIVE
Specific Gravity, Urine: 1.019 (ref 1.001–1.03)
Squamous Epithelial / LPF: NONE SEEN /HPF (ref <=5)
WBC, UA: >=60 /HPF — AB (ref 0–5)
pH: 7 (ref 5.0–8.0)

## 2018-12-22 LAB — LIPID PANEL
Cholesterol: 171 mg/dL (ref <200)
HDL: 56 mg/dL (ref >50)
LDL Cholesterol (Calc): 90 mg/dL (calc)
Non-HDL Cholesterol (Calc): 115 mg/dL (calc) (ref <130)
Total CHOL/HDL Ratio: 3.1 (calc) (ref <5.0)
Triglycerides: 153 mg/dL — ABNORMAL HIGH (ref <150)

## 2018-12-22 LAB — INSULIN, RANDOM: Insulin: 11 u[IU]/mL (ref 2.0–19.6)

## 2018-12-22 LAB — MICROALBUMIN / CREATININE URINE RATIO
Creatinine, Urine: 135 mg/dL (ref 20–275)
Microalb Creat Ratio: 19 mcg/mg creat (ref <30)
Microalb, Ur: 2.5 mg/dL

## 2018-12-22 LAB — MAGNESIUM: Magnesium: 1.8 mg/dL (ref 1.5–2.5)

## 2018-12-22 MED ORDER — CIPROFLOXACIN HCL 250 MG PO TABS: ORAL_TABLET | ORAL | 0 refills | Status: DC

## 2018-12-23 DIAGNOSIS — N3000 Acute cystitis without hematuria: Secondary | ICD-10-CM | POA: Diagnosis not present

## 2018-12-24 LAB — URINE CULTURE
MICRO NUMBER:: 72002
SPECIMEN QUALITY:: ADEQUATE

## 2018-12-25 ENCOUNTER — Encounter: Payer: Self-pay | Admitting: Internal Medicine

## 2019-01-18 ENCOUNTER — Other Ambulatory Visit: Payer: Self-pay | Admitting: Internal Medicine

## 2019-01-18 DIAGNOSIS — N3000 Acute cystitis without hematuria: Secondary | ICD-10-CM

## 2019-01-19 ENCOUNTER — Telehealth: Payer: Self-pay | Admitting: Internal Medicine

## 2019-01-19 ENCOUNTER — Ambulatory Visit (INDEPENDENT_AMBULATORY_CARE_PROVIDER_SITE_OTHER): Payer: Medicare Other

## 2019-01-19 DIAGNOSIS — N3000 Acute cystitis without hematuria: Secondary | ICD-10-CM | POA: Diagnosis not present

## 2019-01-19 NOTE — Telephone Encounter (Signed)
patient called with pain in abdomen, and strong ordor from urine. dropping off urine samples. Per Dr Melford Aase, recommends AZO for pain and discomfort, must wait on UA/CS to result before and prescriptions sent in to [place].

## 2019-01-19 NOTE — Progress Notes (Signed)
At the time of the visit the patient could not leave a urine sample.  Patient has since returned and just to drop off urine. No vitals were done at the first visit due to patient going straight to the restroom after checking in which she stated she needed to go bad but was unable to complete.  Once patient's daughter returned urine sample she report that her mother was still having urinary issues and would like another ABX sent to pharmacy

## 2019-01-20 LAB — URINALYSIS, ROUTINE W REFLEX MICROSCOPIC
Bacteria, UA: NONE SEEN /HPF
Bilirubin Urine: NEGATIVE
Glucose, UA: NEGATIVE
Hyaline Cast: NONE SEEN /LPF
Ketones, ur: NEGATIVE
Nitrite: NEGATIVE
RBC / HPF: NONE SEEN /HPF (ref 0–2)
Specific Gravity, Urine: 1.02 (ref 1.001–1.03)
pH: 6.5 (ref 5.0–8.0)

## 2019-01-20 LAB — URINE CULTURE
MICRO NUMBER:: 194064
SPECIMEN QUALITY:: ADEQUATE

## 2019-01-20 LAB — MICROSCOPIC MESSAGE

## 2019-01-25 ENCOUNTER — Other Ambulatory Visit: Payer: Self-pay

## 2019-01-25 DIAGNOSIS — Z1212 Encounter for screening for malignant neoplasm of rectum: Principal | ICD-10-CM

## 2019-01-25 DIAGNOSIS — Z1211 Encounter for screening for malignant neoplasm of colon: Secondary | ICD-10-CM | POA: Diagnosis not present

## 2019-01-25 LAB — POC HEMOCCULT BLD/STL (HOME/3-CARD/SCREEN)
Card #2 Fecal Occult Blod, POC: NEGATIVE
Card #3 Fecal Occult Blood, POC: NEGATIVE
Fecal Occult Blood, POC: NEGATIVE

## 2019-02-02 DIAGNOSIS — L603 Nail dystrophy: Secondary | ICD-10-CM | POA: Diagnosis not present

## 2019-02-02 DIAGNOSIS — I739 Peripheral vascular disease, unspecified: Secondary | ICD-10-CM | POA: Diagnosis not present

## 2019-03-21 NOTE — Progress Notes (Signed)
Virtual Visit via Telephone Note  I connected with Alyssa Schmitt on 03/22/19 at  9:30 AM EDT by telephone and verified that I am speaking with the correct person using two identifiers.   I discussed the limitations, risks, security and privacy concerns of performing an evaluation and management service by telephone and the availability of in person appointments. I also discussed with the patient that there may be a patient responsible charge related to this service. The patient expressed understanding and agreed to proceed.   I discussed the assessment and treatment plan with the patient. The patient was provided an opportunity to ask questions and all were answered. The patient agreed with the plan and demonstrated an understanding of the instructions.   The patient was advised to call back or seek an in-person evaluation if the symptoms worsen or if the condition fails to improve as anticipated.  I provided 30 minutes of non-face-to-face time during this encounter.   Izora Ribas, NP     MEDICARE ANNUAL WELLNESS VISIT AND FOLLOW UP  Assessment:   Encounter for Annual Medicare Wellness Visit  Essential hypertension -     Continue hctz 25 mg daily, terazosin 10 mg daily -     CBC with Differential/Platelet -     CMP/GFR -     Magnesium  Chronic bronchitis, unspecified chronic bronchitis type ([place])       -     Stable; patient reports she is able to complete ADLs without difficulty  Gastroesophageal reflux disease, esophagitis presence not specified -     Magnesium -     Continue H2blocker/PPI  Hypothyroidism -     Continue levothyroxine 50 mcg daily -     TSH -     Lipid panel  Osteoporosis, unspecified osteoporosis type, unspecified pathological fracture presence -     VITAMIN D 25 Hydroxy (Vit-D Deficiency, Fractures), calcium supplementation -     Has not tolerated medications -     Encouraged to continue with home exercises/weights -     Consider PT as needed  for maintenance of strength and stability  Vitamin D deficiency -     VITAMIN D 25 Hydroxy (Vit-D Deficiency, Fractures)  Obesity (BMI 30-39.9)      -      Patient is working on home exercises; BMI trending down slowly. Discussed diet, encouraged reduced sugar/white flour products, push vegetables, lean proteins, whole grains.   Depression, major, in remission ([place])      -      Currently well managed with celexa 20 mg daily, buspar 10 mg TID PRN anxiety  Medication management -     CBC with Differential/Platelet -     CMP/GFR -     Magnesium  Hypertensive retinopathy of both eyes      -      Continue close monitoring of BPs; continue annual follow up with Dr. Payton [facility] office  Varicose veins of bilateral lower extremities with other complications        -      Continue with support hose daily  Abnormal glucose Recent A1Cs at goal Discussed diet/exercise, weight management  Defer A1C; check CMP  Defer labs due to televisit   Over 40 minutes of exam, counseling, chart review and critical decision making was performed Future Appointments  Date Time Provider [place]  06/21/2019  3:30 PM Unk Pinto, MD [facility] None  02/06/2020  3:00 PM Unk Pinto, MD [facility] None     Plan:  During the course of the visit the patient was educated and counseled about appropriate screening and preventive services including:    Pneumococcal vaccine   Prevnar 13  Influenza vaccine  Td vaccine  Screening electrocardiogram  Bone densitometry screening  Colorectal cancer screening  Diabetes screening  Glaucoma screening  Nutrition counseling   Advanced directives: requested   Subjective:  Alyssa Schmitt is a 85 y.o. very pleasant [place] female who presents for Medicare Annual Wellness Visit and 3 month follow up on chronic medical conditions.   She denies any concerns today, reports she has been very well these past few months, and has been working  hard on a home exercise routine that she can do from her chair and with her walker.   She is driven by her daughter whom she lives with after her husband passed away.   she has a diagnosis of GERD which is currently managed by omeprazole 40 mg  she reports symptoms is currently well controlled, and denies breakthrough reflux, burning in chest, hoarseness or cough.    BMI is There is no height or weight on file to calculate BMI., she has been working on diet and exercise (does chair exercises and walks in home with walker).  Wt Readings from Last 3 Encounters:  12/21/18 184 lb 4.8 oz (83.6 kg)  10/04/18 185 lb (83.9 kg)  09/27/18 185 lb (83.9 kg)   Her blood pressure has been controlled at home, today their BP is BP: (!) 144/72  She does workout. She denies chest pain, shortness of breath, dizziness.   She is not on cholesterol medication and denies myalgias. Her cholesterol is at goal. The cholesterol last visit was:   Lab Results  Component Value Date   CHOL 171 12/21/2018   HDL 56 12/21/2018   LDLCALC 90 12/21/2018   TRIG 153 (H) 12/21/2018   CHOLHDL 3.1 12/21/2018    She has been working on diet and exercise for glucose management, and denies increased appetite, nausea, paresthesia of the feet, polydipsia and polyuria. Last A1C in the office was:  Lab Results  Component Value Date   HGBA1C 5.4 12/21/2018   She is on thyroid medication. Her medication was not changed last visit.   Lab Results  Component Value Date   TSH 3.84 12/21/2018   Patient is on Vitamin D supplement and at goal:  Lab Results  Component Value Date   VD25OH 79 12/21/2018      Medication Review: Current Outpatient Medications on File Prior to Visit  Medication Sig Dispense Refill  . acetaminophen (TYLENOL) 325 MG tablet Take 2 tablets (650 mg total) by mouth every 6 (six) hours as needed (prn TEMP>38.1 Celsius). 30 tablet 2  . busPIRone (BUSPAR) 10 MG tablet TAKE 1/2 - 1 TABLET 3 TIMES A DAY AS  NEEDED FOR ANXIETY 90 tablet 5  . calcium-vitamin D (OSCAL WITH D) 250-125 MG-UNIT per tablet Take 1 tablet by mouth daily.    . Cholecalciferol (VITAMIN D3) 2000 UNITS TABS Take 4 tablets by mouth daily.     . citalopram (CELEXA) 20 MG tablet TAKE 1/2-1 TABLET BY MOUTH DAILY 90 tablet 1  . fexofenadine (ALLEGRA) 180 MG tablet Take 180 mg by mouth daily.     . hydrochlorothiazide (HYDRODIURIL) 25 MG tablet TAKE 1 TABLET BY MOUTH EVERY DAY 90 tablet 1  . levothyroxine (SYNTHROID, LEVOTHROID) 50 MCG tablet TAKE 1 TABLET BY MOUTH DAILY 90 tablet 3  . magnesium oxide (MAG-OX) 400 (241.3 MG) MG  tablet Take 1 tablet (400 mg total) by mouth 2 (two) times daily. (Patient taking differently: Take 400 mg by mouth daily. ) 60 tablet 2  . Multiple Vitamin (MULITIVITAMIN WITH MINERALS) TABS Take 1 tablet by mouth daily.    [name] Kitchen nystatin (NYSTATIN) powder Apply topically 3 (three) times daily. 15 g 0  . omeprazole (PRILOSEC) 40 MG capsule TAKE ONE CAPSULE BY MOUTH EVERY DAY 90 capsule 4  . Probiotic Product (PROBIOTIC FORMULA PO) Take 1 tablet by mouth daily.     [name] Kitchen terazosin (HYTRIN) 10 MG capsule TAKE 1 CAPSULE BY MOUTH EVERY DAY 90 capsule 3  . tobramycin-dexamethasone (TOBRADEX) ophthalmic solution Instill 1 to 2 drops to the affected eye  3 to 4 x /day (Patient taking differently: Instill 1 to 2 drops to the affected eye  3 to 4 x /day as needed) 5 mL 0  . ciprofloxacin (CIPRO) 250 MG tablet Take 1 tablet 2 x /day  With a meal  for UTI 20 tablet 0  . gabapentin (NEURONTIN) 100 MG capsule Take 1 capsule (100 mg total) by mouth 3 (three) times daily. (Patient not taking: Reported on 03/22/2019) 30 capsule 0  . neomycin-polymyxin b-dexamethasone (MAXITROL) 3.5-10000-0.1 OINT      No current facility-administered medications on file prior to visit.     Allergies  Allergen Reactions  . Aspirin Swelling  . Celebrex [Celecoxib] Other (See Comments)    "my body lit up"  . Fosamax [Alendronate Sodium] Other (See  Comments)    unknown  . Keflex [Cephalexin] Other (See Comments)    unknown  . Penicillins Other (See Comments)    unknown  . Prednisone Itching  . Prevacid [Lansoprazole] Other (See Comments)    unknown  . Sulfa Antibiotics Other (See Comments)    unknown  . Tetanus Toxoids Other (See Comments)    unknown  . Tetracyclines & Related Nausea Only    Nausea     Current Problems (verified) Patient Active Problem List   Diagnosis Date Noted  . Elevated LFTs 08/26/2018  . Hypertensive retinopathy of both eyes 09/21/2017  . Depression, major, in remission ([place]) 06/22/2016  . COPD 08/15/2015  . GERD  08/15/2015  . Osteoporosis 07/16/2015  . Hypothyroidism   . Morbid obesity ([place]) 08/01/2014  . Medication management 03/20/2014  . Hypertension   . Other abnormal glucose   . Vitamin D deficiency   . Varicose veins of bilateral lower extremities with other complications 26/94/8546    Screening Tests Immunization History  Administered Date(s) Administered  . Influenza, High Dose Seasonal PF 09/05/2014, 09/17/2015, 08/25/2016, 09/22/2017, 08/25/2018  . Pneumococcal Conjugate-13 11/20/2015  . Pneumococcal Polysaccharide-23 12/18/2007  . Zoster 07/25/2013   Preventative care: Last colonoscopy: 2001 will not get another Last mammogram:07/2018 Last pap smear/pelvic exam: Remote with Dr. Philis Pique   DEXA: 2016 T -3.1 Intolerant of medications  Echo 2013  Prior vaccinations:  TD or Tdap: ALLERGY  Influenza: 09/22/2018 Pneumococcal: 2009 Prevnar 13: 2016 Shingles/Zostavax: 2014  Names of Other Physician/Practitioners you currently use: 1. [facility] here for primary care 2. Dr. Marshall Cork, eye doctor, last visit early this year 2020 3. Wears full dentures, last dentist several years ago, denies issues  Patient Care Team: Unk Pinto, MD as PCP - General (Internal Medicine) Nahser, Wonda Cheng, MD (Cardiology) Unk Pinto, MD as Attending Physician (Internal Medicine) Kelby Aline, PA-C as Physician Assistant (Physician Assistant) Monna Fam, MD as Consulting Physician (Ophthalmology) Mal Misty, MD (Inactive)  as Consulting Physician (Vascular Surgery) Despina Hick, MD as Referring Physician (Radiology) Garald Balding, MD as Consulting Physician (Orthopedic Surgery) Clarene Essex, MD as Consulting Physician (Gastroenterology)  SURGICAL HISTORY She  has a past surgical history that includes Tonsillectomy and Vesicovaginal fistula closure w/ TAH. FAMILY HISTORY Her family history includes Diabetes in her father; Heart disease in her mother; Other in her brother and mother. SOCIAL HISTORY She  reports that she has never smoked. She has never used smokeless tobacco. She reports that she does not drink alcohol or use drugs.   MEDICARE WELLNESS OBJECTIVES: Physical activity: Current Exercise Habits: The patient does not participate in regular exercise at present, Exercise limited by: Other - see comments(cellulitis, ortho) Cardiac risk factors: Cardiac Risk Factors include: sedentary lifestyle;hypertension;dyslipidemia;advanced age (>54men, >32 women);obesity (BMI >30kg/m2) Depression/mood screen:   Depression screen [facility] 2/9 03/22/2019  Decreased Interest 0  Down, Depressed, Hopeless 0  PHQ - 2 Score 0    ADLs:  In your present state of health, do you have any difficulty performing the following activities: 03/22/2019 12/25/2018  Hearing? N N  Vision? N N  Comment doing well with readers -  Difficulty concentrating or making decisions? N N  Walking or climbing stairs? Y N  Comment doing well with walker and electric chair -  Dressing or bathing? N N  Doing errands, shopping? Y N  Comment driven by family  -  Conservation officer, nature and eating ? N -  Using the Toilet? N -  In the past six months, have you accidently leaked urine? N -  Do you have problems with loss of bowel control? N -   Managing your Medications? N -  Managing your Finances? N -  Housekeeping or managing your Housekeeping? Y -  Comment family assists -  Some recent data might be hidden     Cognitive Testing  Alert? Yes  Normal Appearance?Yes  Oriented to person? Yes  Place? Yes   Time? Yes  Recall of three objects?  Yes  Can perform simple calculations? Yes  Displays appropriate judgment?Yes  Can read the correct time from a watch face?Yes  EOL planning: Does Patient Have a Medical Advance Directive?: Yes Type of Advance Directive: Healthcare Power of Attorney, Living will Does patient want to make changes to medical advance directive?: No - Patient declined Copy of [place] in Chart?: No - copy requested  Review of Systems  Constitutional: Negative.  Negative for malaise/fatigue.  HENT: Negative for congestion, hearing loss and sore throat.   Eyes: Negative for blurred vision.  Respiratory: Negative for cough, sputum production, shortness of breath and wheezing.   Cardiovascular: Negative for chest pain, palpitations, orthopnea, claudication and leg swelling.  Gastrointestinal: Negative for abdominal pain, blood in stool, constipation, diarrhea, heartburn, melena, nausea and vomiting.  Genitourinary: Negative.  Negative for frequency.  Musculoskeletal: Negative for falls, joint pain and myalgias.  Skin: Negative.   Neurological: Negative for dizziness, tingling, tremors, sensory change, focal weakness, weakness and headaches.  Endo/Heme/Allergies: Negative.  Negative for polydipsia. Does not bruise/bleed easily.  Psychiatric/Behavioral: Negative.  Negative for depression, memory loss and suicidal ideas.     Objective:     Today's Vitals   03/22/19 0936  BP: (!) 144/72  Pulse: 71  Temp: (!) 97.5 F (36.4 C)   There is no height or weight on file to calculate BMI.  General : Well sounding patient in no apparent distress HEENT: no hoarseness, no cough for duration  of visit Lungs: speaks in complete sentences, no audible wheezing, no apparent distress Neurological: alert, oriented x 3 Psychiatric: pleasant, judgement appropriate    Medicare Attestation I have personally reviewed: The patient's medical and social history Their use of alcohol, tobacco or illicit drugs Their current medications and supplements The patient's functional ability including ADLs,fall risks, home safety risks, cognitive, and hearing and visual impairment Diet and physical activities Evidence for depression or mood disorders  The patient's weight, height, BMI, and visual acuity have been recorded in the chart.  I have made referrals, counseling, and provided education to the patient based on review of the above and I have provided the patient with a written personalized care plan for preventive services.     Izora Ribas, NP   03/22/2019

## 2019-03-22 ENCOUNTER — Encounter: Payer: Self-pay | Admitting: Adult Health

## 2019-03-22 ENCOUNTER — Other Ambulatory Visit: Payer: Self-pay

## 2019-03-22 ENCOUNTER — Telehealth: Payer: Medicare Other | Admitting: Adult Health

## 2019-03-22 ENCOUNTER — Telehealth: Payer: Self-pay | Admitting: Adult Health

## 2019-03-22 VITALS — BP 144/72 | HR 71 | Temp 97.5°F

## 2019-03-22 DIAGNOSIS — R945 Abnormal results of liver function studies: Secondary | ICD-10-CM

## 2019-03-22 DIAGNOSIS — E038 Other specified hypothyroidism: Secondary | ICD-10-CM

## 2019-03-22 DIAGNOSIS — J42 Unspecified chronic bronchitis: Secondary | ICD-10-CM | POA: Diagnosis not present

## 2019-03-22 DIAGNOSIS — Z79899 Other long term (current) drug therapy: Secondary | ICD-10-CM

## 2019-03-22 DIAGNOSIS — Z0000 Encounter for general adult medical examination without abnormal findings: Secondary | ICD-10-CM

## 2019-03-22 DIAGNOSIS — R7309 Other abnormal glucose: Secondary | ICD-10-CM

## 2019-03-22 DIAGNOSIS — R6889 Other general symptoms and signs: Secondary | ICD-10-CM | POA: Diagnosis not present

## 2019-03-22 DIAGNOSIS — E559 Vitamin D deficiency, unspecified: Secondary | ICD-10-CM

## 2019-03-22 DIAGNOSIS — Z0001 Encounter for general adult medical examination with abnormal findings: Secondary | ICD-10-CM | POA: Diagnosis not present

## 2019-03-22 DIAGNOSIS — M810 Age-related osteoporosis without current pathological fracture: Secondary | ICD-10-CM

## 2019-03-22 DIAGNOSIS — K219 Gastro-esophageal reflux disease without esophagitis: Secondary | ICD-10-CM

## 2019-03-22 DIAGNOSIS — R7989 Other specified abnormal findings of blood chemistry: Secondary | ICD-10-CM

## 2019-03-22 DIAGNOSIS — I10 Essential (primary) hypertension: Secondary | ICD-10-CM

## 2019-03-22 DIAGNOSIS — H35033 Hypertensive retinopathy, bilateral: Secondary | ICD-10-CM

## 2019-03-22 DIAGNOSIS — I83893 Varicose veins of bilateral lower extremities with other complications: Secondary | ICD-10-CM

## 2019-03-22 DIAGNOSIS — F325 Major depressive disorder, single episode, in full remission: Secondary | ICD-10-CM

## 2019-04-05 ENCOUNTER — Other Ambulatory Visit: Payer: Self-pay | Admitting: Physician Assistant

## 2019-04-14 ENCOUNTER — Other Ambulatory Visit: Payer: Self-pay | Admitting: Adult Health

## 2019-05-12 DIAGNOSIS — H40033 Anatomical narrow angle, bilateral: Secondary | ICD-10-CM | POA: Diagnosis not present

## 2019-05-12 DIAGNOSIS — H40013 Open angle with borderline findings, low risk, bilateral: Secondary | ICD-10-CM | POA: Diagnosis not present

## 2019-05-14 ENCOUNTER — Other Ambulatory Visit: Payer: Self-pay | Admitting: Internal Medicine

## 2019-06-21 ENCOUNTER — Other Ambulatory Visit: Payer: Self-pay

## 2019-06-21 ENCOUNTER — Ambulatory Visit (INDEPENDENT_AMBULATORY_CARE_PROVIDER_SITE_OTHER): Payer: Medicare Other | Admitting: Internal Medicine

## 2019-06-21 ENCOUNTER — Encounter: Payer: Self-pay | Admitting: Internal Medicine

## 2019-06-21 VITALS — BP 120/80 | HR 92 | Temp 97.1°F | Resp 12 | Ht 61.0 in | Wt 181.0 lb

## 2019-06-21 DIAGNOSIS — R7309 Other abnormal glucose: Secondary | ICD-10-CM | POA: Diagnosis not present

## 2019-06-21 DIAGNOSIS — E782 Mixed hyperlipidemia: Secondary | ICD-10-CM | POA: Diagnosis not present

## 2019-06-21 DIAGNOSIS — I10 Essential (primary) hypertension: Secondary | ICD-10-CM | POA: Diagnosis not present

## 2019-06-21 DIAGNOSIS — E038 Other specified hypothyroidism: Secondary | ICD-10-CM

## 2019-06-21 DIAGNOSIS — Z79899 Other long term (current) drug therapy: Secondary | ICD-10-CM

## 2019-06-21 DIAGNOSIS — E559 Vitamin D deficiency, unspecified: Secondary | ICD-10-CM | POA: Diagnosis not present

## 2019-06-21 DIAGNOSIS — R7303 Prediabetes: Secondary | ICD-10-CM | POA: Diagnosis not present

## 2019-06-21 MED ORDER — CITALOPRAM HYDROBROMIDE 20 MG PO TABS: ORAL_TABLET | ORAL | 1 refills | Status: DC

## 2019-06-21 NOTE — Progress Notes (Signed)
History of Present Illness:      This very nice 85 y.o. WWF  presents for 6 month follow up with HTN, HLD, Pre-Diabetes and Vitamin D Deficiency.       Patient is treated for HTN & BP has been controlled at home. Today's BP is at goal - 120/80. Patient has had no complaints of any cardiac type chest pain, palpitations, dyspnea / orthopnea / PND, dizziness, claudication, or dependent edema.      Hyperlipidemia is controlled with diet & meds. Patient denies myalgias or other med SE's. Last Lipids were at goal: Lab Results  Component Value Date   CHOL 179 06/21/2019   HDL 61 06/21/2019   LDLCALC 99 06/21/2019   TRIG 92 06/21/2019   CHOLHDL 2.9 06/21/2019       Also, the patient has history of PreDiabetes and has had no symptoms of reactive hypoglycemia, diabetic polys, paresthesias or visual blurring.  Last A1c was Normal & at goal: Lab Results  Component Value Date   HGBA1C 5.4 06/21/2019       Further, the patient also has history of Vitamin D Deficiency and supplements vitamin D without any suspected side-effects. Last vitamin D was at goal: Lab Results  Component Value Date   VD25OH 94 06/21/2019   Current Outpatient Medications on File Prior to Visit  Medication Sig  . acetaminophen (TYLENOL) 325 MG tablet Take 2 tablets (650 mg total) by mouth every 6 (six) hours as needed (prn TEMP>38.1 Celsius).  . busPIRone (BUSPAR) 10 MG tablet TAKE 1/2 - 1 TABLET 3 TIMES A DAY AS NEEDED FOR ANXIETY  . calcium-vitamin D (OSCAL WITH D) 250-125 MG-UNIT per tablet Take 1 tablet by mouth daily.  . Cholecalciferol (VITAMIN D3) 2000 UNITS TABS Take 4 tablets by mouth daily.   . fexofenadine (ALLEGRA) 180 MG tablet Take 180 mg by mouth daily.   . hydrochlorothiazide (HYDRODIURIL) 25 MG tablet Take 1 tablet Daily for BP & Fluid  . levothyroxine (SYNTHROID) 50 MCG tablet Take 1 tablet daily on an empty stomach with only water for 30 minutes & no Antacid meds, Calcium or Magnesium for 4 hours & avoid  Biotin  . magnesium oxide (MAG-OX) 400 (241.3 MG) MG tablet Take 1 tablet (400 mg total) by mouth 2 (two) times daily. (Patient taking differently: Take 400 mg by mouth 2 (two) times daily. Takes one tablet daily)  . Multiple Vitamin (MULITIVITAMIN WITH MINERALS) TABS Take 1 tablet by mouth daily.  [name] Kitchen neomycin-polymyxin b-dexamethasone (MAXITROL) 3.5-10000-0.1 OINT   . nystatin (NYSTATIN) powder Apply topically 3 (three) times daily.  [name] Kitchen omeprazole (PRILOSEC) 40 MG capsule TAKE 1 CAPSULE BY MOUTH EVERY DAY  . Probiotic Product (PROBIOTIC FORMULA PO) Take 1 tablet by mouth daily.   [name] Kitchen terazosin (HYTRIN) 10 MG capsule TAKE 1 CAPSULE BY MOUTH EVERY DAY  . tobramycin-dexamethasone (TOBRADEX) ophthalmic solution Instill 1 to 2 drops to the affected eye  3 to 4 x /day (Patient taking differently: Instill 1 to 2 drops to the affected eye  3 to 4 x /day as needed)   No current facility-administered medications on file prior to visit.    Allergies  Allergen Reactions  . Aspirin Swelling  . Celebrex [Celecoxib] Other (See Comments)    "my body lit up"  . Fosamax [Alendronate Sodium] Other (See Comments)    unknown  . Keflex [Cephalexin] Other (See Comments)    unknown  . Penicillins Other (See Comments)    unknown  . Prednisone Itching  .  Prevacid [Lansoprazole] Other (See Comments)    unknown  . Sulfa Antibiotics Other (See Comments)    unknown  . Tetanus Toxoids Other (See Comments)    unknown  . Tetracyclines & Related Nausea Only    Nausea    PMHx:   Past Medical History:  Diagnosis Date  . COPD (chronic obstructive pulmonary disease) ([place])   . GERD (gastroesophageal reflux disease)   . Hyperlipidemia   . Hypertension   . Osteoporosis   . Prediabetes   . Vitamin D deficiency    Immunization History  Administered Date(s) Administered  . Influenza, High Dose Seasonal PF 09/05/2014, 09/17/2015, 08/25/2016, 09/22/2017, 08/25/2018  . Pneumococcal Conjugate-13 11/20/2015  .  Pneumococcal Polysaccharide-23 12/18/2007  . Zoster 07/25/2013   Past Surgical History:  Procedure Laterality Date  . TONSILLECTOMY     childhood  . VESICOVAGINAL FISTULA CLOSURE W/ TAH     FHx:    Reviewed / unchanged  SHx:    Reviewed / unchanged   Systems Review:  Constitutional: Denies fever, chills, wt changes, headaches, insomnia, fatigue, night sweats, change in appetite. Eyes: Denies redness, blurred vision, diplopia, discharge, itchy, watery eyes.  ENT: Denies discharge, congestion, post nasal drip, epistaxis, sore throat, earache, hearing loss, dental pain, tinnitus, vertigo, sinus pain, snoring.  CV: Denies chest pain, palpitations, irregular heartbeat, syncope, dyspnea, diaphoresis, orthopnea, PND, claudication or edema. Respiratory: denies cough, dyspnea, DOE, pleurisy, hoarseness, laryngitis, wheezing.  Gastrointestinal: Denies dysphagia, odynophagia, heartburn, reflux, water brash, abdominal pain or cramps, nausea, vomiting, bloating, diarrhea, constipation, hematemesis, melena, hematochezia  or hemorrhoids. Genitourinary: Denies dysuria, frequency, urgency, nocturia, hesitancy, discharge, hematuria or flank pain. Musculoskeletal: Denies arthralgias, myalgias, stiffness, jt. swelling, pain, limping or strain/sprain.  Skin: Denies pruritus, rash, hives, warts, acne, eczema or change in skin lesion(s). Neuro: No weakness, tremor, incoordination, spasms, paresthesia or pain. Psychiatric: Denies confusion, memory loss or sensory loss. Endo: Denies change in weight, skin or hair change.  Heme/Lymph: No excessive bleeding, bruising or enlarged lymph nodes.  Physical Exam  BP 120/80   Pulse 92   Temp (!) 97.1 F (36.2 C)   Resp 12   Ht 5\' 1"  (1.549 m)   Wt 181 lb (82.1 kg)   SpO2 95%   BMI 34.20 kg/m   Appears  well nourished, well groomed  and in no distress.  Eyes: PERRLA, EOMs, conjunctiva no swelling or erythema. Sinuses: No frontal/maxillary  tenderness ENT/Mouth: EAC's clear, TM's nl w/o erythema, bulging. Nares clear w/o erythema, swelling, exudates. Oropharynx clear without erythema or exudates. Oral hygiene is good. Tongue normal, non obstructing. Hearing intact.  Neck: Supple. Thyroid not palpable. Car 2+/2+ without bruits, nodes or JVD. Chest: Respirations nl with BS clear & equal w/o rales, rhonchi, wheezing or stridor.  Cor: Heart sounds normal w/ regular rate and rhythm without sig. murmurs, gallops, clicks or rubs. Peripheral pulses normal and equal  without edema.  Abdomen: Soft & bowel sounds normal. Non-tender w/o guarding, rebound, hernias, masses or organomegaly.  Lymphatics: Unremarkable.  Musculoskeletal: Full ROM all peripheral extremities, joint stability, 5/5 strength and normal gait.  Skin: Warm, dry without exposed rashes, lesions or ecchymosis apparent.  Neuro: Cranial nerves intact, reflexes equal bilaterally. Sensory-motor testing grossly intact. Tendon reflexes grossly intact.  Pysch: Alert & oriented x 3.  Insight and judgement nl & appropriate. No ideations.  Assessment and Plan:  1. Essential hypertension  - Continue medication, monitor blood pressure at home.  - Continue DASH diet.  Reminder to go to the ER  if any CP,  SOB, nausea, dizziness, severe HA, changes vision/speech.  - CBC with Differential/Platelet - COMPLETE METABOLIC PANEL WITH GFR - Magnesium - TSH  2. Hyperlipidemia, mixed  - Continue diet/meds, exercise,& lifestyle modifications.  - Continue monitor periodic cholesterol/liver & renal functions   - Lipid panel - TSH  3. Abnormal glucose  - Continue diet, exercise  - Lifestyle modifications.  - Monitor appropriate labs.  - Hemoglobin A1c - Insulin, random  4. Vitamin D deficiency  - Continue supplementation.  - VITAMIN D 25 Hydroxyl  5. Prediabetes  - Hemoglobin A1c - Insulin, random  6. Hypothyroidism  - TSH  7. Medication management  - CBC with  Differential/Platelet - COMPLETE METABOLIC PANEL WITH GFR - Magnesium - Lipid panel - TSH - Hemoglobin A1c - Insulin, random - VITAMIN D 25 Hydroxyl        Discussed  regular exercise, BP monitoring, weight control to achieve/maintain BMI less than 25 and discussed med and SE's. Recommended labs to assess and monitor clinical status with further disposition pending results of labs.  I discussed the assessment and treatment plan with the patient. The patient was provided an opportunity to ask questions and all were answered. The patient agreed with the plan and demonstrated an understanding of the instructions.  I provided over 30 minutes of exam, counseling, chart review and  complex critical decision making.  Kirtland Bouchard, MD

## 2019-06-21 NOTE — Patient Instructions (Signed)

## 2019-06-22 LAB — COMPLETE METABOLIC PANEL WITH GFR
AG Ratio: 1.4 (calc) (ref 1.0–2.5)
ALT: 60 U/L — ABNORMAL HIGH (ref 6–29)
AST: 73 U/L — ABNORMAL HIGH (ref 10–35)
Albumin: 4.6 g/dL (ref 3.6–5.1)
Alkaline phosphatase (APISO): 58 U/L (ref 37–153)
BUN/Creatinine Ratio: 22 (calc) (ref 6–22)
BUN: 20 mg/dL (ref 7–25)
CO2: 30 mmol/L (ref 20–32)
Calcium: 10.2 mg/dL (ref 8.6–10.4)
Chloride: 98 mmol/L (ref 98–110)
Creat: 0.93 mg/dL — ABNORMAL HIGH (ref 0.60–0.88)
GFR, Est African American: 64 mL/min/{1.73_m2} (ref >=60)
GFR, Est Non African American: 55 mL/min/{1.73_m2} — ABNORMAL LOW (ref >=60)
Globulin: 3.3 g/dL (calc) (ref 1.9–3.7)
Glucose, Bld: 102 mg/dL — ABNORMAL HIGH (ref 65–99)
Potassium: 3.6 mmol/L (ref 3.5–5.3)
Sodium: 137 mmol/L (ref 135–146)
Total Bilirubin: 0.9 mg/dL (ref 0.2–1.2)
Total Protein: 7.9 g/dL (ref 6.1–8.1)

## 2019-06-22 LAB — HEMOGLOBIN A1C
Hgb A1c MFr Bld: 5.4 % of total Hgb (ref <5.7)
Mean Plasma Glucose: 108 (calc)
eAG (mmol/L): 6 (calc)

## 2019-06-22 LAB — CBC WITH DIFFERENTIAL/PLATELET
Absolute Monocytes: 428 cells/uL (ref 200–950)
Basophils Absolute: 28 cells/uL (ref 0–200)
Basophils Relative: 0.6 %
Eosinophils Absolute: 28 cells/uL (ref 15–500)
Eosinophils Relative: 0.6 %
HCT: 44.4 % (ref 35.0–45.0)
Hemoglobin: 15 g/dL (ref 11.7–15.5)
Lymphs Abs: 1242 cells/uL (ref 850–3900)
MCH: 30.5 pg (ref 27.0–33.0)
MCHC: 33.8 g/dL (ref 32.0–36.0)
MCV: 90.2 fL (ref 80.0–100.0)
MPV: 11.1 fL (ref 7.5–12.5)
Monocytes Relative: 9.3 %
Neutro Abs: 2875 cells/uL (ref 1500–7800)
Neutrophils Relative %: 62.5 %
Platelets: 169 10*3/uL (ref 140–400)
RBC: 4.92 10*6/uL (ref 3.80–5.10)
RDW: 12.7 % (ref 11.0–15.0)
Total Lymphocyte: 27 %
WBC: 4.6 10*3/uL (ref 3.8–10.8)

## 2019-06-22 LAB — TSH: TSH: 2.58 mIU/L (ref 0.40–4.50)

## 2019-06-22 LAB — INSULIN, RANDOM: Insulin: 15.6 u[IU]/mL

## 2019-06-22 LAB — VITAMIN D 25 HYDROXY (VIT D DEFICIENCY, FRACTURES): Vit D, 25-Hydroxy: 94 ng/mL (ref 30–100)

## 2019-06-22 LAB — LIPID PANEL
Cholesterol: 179 mg/dL (ref <200)
HDL: 61 mg/dL (ref >=50)
LDL Cholesterol (Calc): 99 mg/dL (calc)
Non-HDL Cholesterol (Calc): 118 mg/dL (calc) (ref <130)
Total CHOL/HDL Ratio: 2.9 (calc) (ref <5.0)
Triglycerides: 92 mg/dL (ref <150)

## 2019-06-22 LAB — MAGNESIUM: Magnesium: 1.7 mg/dL (ref 1.5–2.5)

## 2019-06-25 ENCOUNTER — Encounter: Payer: Self-pay | Admitting: Internal Medicine

## 2019-07-03 ENCOUNTER — Telehealth: Payer: Self-pay | Admitting: *Deleted

## 2019-07-03 NOTE — Telephone Encounter (Signed)
Patient called and states she is having muscle spasms. Per Dr Melford Aase, the patient was advised to take OTC Ibuprofen 2 tablets up to 4 times a day.

## 2019-07-21 ENCOUNTER — Other Ambulatory Visit: Payer: Self-pay | Admitting: Internal Medicine

## 2019-07-24 ENCOUNTER — Telehealth: Payer: Self-pay | Admitting: *Deleted

## 2019-07-24 NOTE — Telephone Encounter (Signed)
Order for a lift chair faxed to [employer] at [phone].The diagnoses were DJD, severe deconditioning and weakness.

## 2019-09-18 DIAGNOSIS — I739 Peripheral vascular disease, unspecified: Secondary | ICD-10-CM | POA: Diagnosis not present

## 2019-09-18 DIAGNOSIS — L603 Nail dystrophy: Secondary | ICD-10-CM | POA: Diagnosis not present

## 2019-09-20 DIAGNOSIS — H40033 Anatomical narrow angle, bilateral: Secondary | ICD-10-CM | POA: Diagnosis not present

## 2019-09-20 DIAGNOSIS — H2513 Age-related nuclear cataract, bilateral: Secondary | ICD-10-CM | POA: Diagnosis not present

## 2019-09-20 DIAGNOSIS — H40013 Open angle with borderline findings, low risk, bilateral: Secondary | ICD-10-CM | POA: Diagnosis not present

## 2019-09-20 DIAGNOSIS — H538 Other visual disturbances: Secondary | ICD-10-CM | POA: Diagnosis not present

## 2019-09-20 LAB — HM DIABETES EYE EXAM

## 2019-09-21 NOTE — Progress Notes (Signed)
3 MONTH FOLLOW UP  Assessment:   Alyssa Schmitt was seen today for follow-up.  Diagnoses and all orders for this visit:  Essential hypertension -     Continue hctz 25 mg daily, terazosin 10 mg daily  -     CBC with Differential/Platelet -     CMP/GFR -     Magnesium  Chronic bronchitis, unspecified chronic bronchitis type ([place])       -     By imaging, Stable without inhalers; patient reports she is able to complete ADLs without difficulty  Gastroesophageal reflux disease, esophagitis presence not specified -     Magnesium -     Continue H2blocker/PPI  Hypothyroidism -     Continue levothyroxine 50 mcg daily -     TSH  Vitamin D deficiency At goal at recent check; continue to recommend supplementation for goal of 60-100 Defer vitamin D level  Obesity (BMI 30-34.9) with comorbidities (htn, hyperlipidemia, GERD)      -      Patient is working on home exercises. Discussed diet, encouraged reduced sugar/white flour products, push vegetables, lean proteins, whole grains.   Depression, major, in remission ([place])      -      Currently well managed with celexa 20 mg daily, buspar 10 mg TID PRN anxiety  Medication management -     CBC with Differential/Platelet -     CMP/GFR -     TSH -     Magnesium  Hypertensive retinopathy of both eyes      -      Continue close monitoring of BPs; continue annual follow up with Dr. Payton [place] office  Other abnormal glucose -     Continue diet/exercise, controlled weight loss Recent A1Cs at goal Discussed diet/exercise, weight management  Defer A1C; check CMP  Need for immunization against influenza  -     Flu vaccine HIGH DOSE PF (Fluzone High dose)   Over 40 minutes of exam, counseling, chart review and critical decision making was performed Future Appointments  Date Time Provider [place]  02/06/2020  3:00 PM Unk Pinto, MD [facility] None  05/09/2020 10:00 AM [name], NP [facility] None     Subjective:  Alyssa Schmitt is a 85 y.o. very pleasant [place] female who presents for  3 month follow up on chronic medical conditions.   She denies any concerns today, reports she has been very well these past few months, and has been working hard on a home exercise routine that she can do from her chair and with her walker, doing twice a day. She is driven to our visit today by her daughter (waiting in parking lot) whom she lives with after her husband passed away nearly 10 years.  She has depression with anxiety in remission/well controlled on celexa and buspar (taking 10 mg BID), not crying as much.   BMI is Body mass index is 34.58 kg/m., she has been working on diet and exercise. Having oatmeal and applesauce for breakfast, banana for snack, yogurt, 1/2 sandwich with corn chips for lunch. Drinks water all day.  Wt Readings from Last 3 Encounters:  09/25/19 183 lb (83 kg)  06/21/19 181 lb (82.1 kg)  12/21/18 184 lb 4.8 oz (83.6 kg)   Her blood pressure has been controlled at home, today their BP is BP: 112/60  She does workout. She denies chest pain, shortness of breath, dizziness.   She is not on cholesterol medication and denies myalgias. Her cholesterol  is at goal. The cholesterol last visit was:   Lab Results  Component Value Date   CHOL 179 06/21/2019   HDL 61 06/21/2019   LDLCALC 99 06/21/2019   TRIG 92 06/21/2019   CHOLHDL 2.9 06/21/2019    She has been working on diet and exercise for glucose management, and denies increased appetite, nausea, paresthesia of the feet, polydipsia and polyuria. Last A1C in the office was:  Lab Results  Component Value Date   HGBA1C 5.4 06/21/2019   She is on thyroid medication. Her medication was not changed last visit. She takes 50 mcg daily and stable for several years.  Lab Results  Component Value Date   TSH 2.58 06/21/2019   Patient is on Vitamin D supplement and at goal:  Lab Results  Component Value Date   VD25OH 94 06/21/2019      Medication  Review: Current Outpatient Medications on File Prior to Visit  Medication Sig Dispense Refill  . acetaminophen (TYLENOL) 325 MG tablet Take 2 tablets (650 mg total) by mouth every 6 (six) hours as needed (prn TEMP>38.1 Celsius). 30 tablet 2  . busPIRone (BUSPAR) 10 MG tablet TAKE 1/2 - 1 TABLET 3 TIMES A DAY AS NEEDED FOR ANXIETY 90 tablet 5  . calcium-vitamin D (OSCAL WITH D) 250-125 MG-UNIT per tablet Take 1 tablet by mouth daily.    . Cholecalciferol (VITAMIN D3) 2000 UNITS TABS Take 4 tablets by mouth daily.     . citalopram (CELEXA) 20 MG tablet Take 1/2 to one tablet daily 90 tablet 1  . fexofenadine (ALLEGRA) 180 MG tablet Take 180 mg by mouth daily.     . hydrochlorothiazide (HYDRODIURIL) 25 MG tablet Take 1 tablet Daily for BP & Fluid 90 tablet 3  . levothyroxine (SYNTHROID) 50 MCG tablet Take 1 tablet daily on an empty stomach with only water for 30 minutes & no Antacid meds, Calcium or Magnesium for 4 hours & avoid Biotin 90 tablet 3  . magnesium oxide (MAG-OX) 400 (241.3 MG) MG tablet Take 1 tablet (400 mg total) by mouth 2 (two) times daily. (Patient taking differently: Take 400 mg by mouth 2 (two) times daily. Takes one tablet daily) 60 tablet 2  . Multiple Vitamin (MULITIVITAMIN WITH MINERALS) TABS Take 1 tablet by mouth daily.    [name] Kitchen neomycin-polymyxin b-dexamethasone (MAXITROL) 3.5-10000-0.1 OINT     . nystatin (NYSTATIN) powder Apply topically 3 (three) times daily. 15 g 0  . omeprazole (PRILOSEC) 40 MG capsule TAKE 1 CAPSULE BY MOUTH EVERY DAY 90 capsule 4  . Probiotic Product (PROBIOTIC FORMULA PO) Take 1 tablet by mouth daily.     [name] Kitchen terazosin (HYTRIN) 10 MG capsule TAKE 1 CAPSULE BY MOUTH EVERY DAY 90 capsule 3  . tobramycin-dexamethasone (TOBRADEX) ophthalmic solution Instill 1 to 2 drops to the affected eye  3 to 4 x /day (Patient taking differently: Instill 1 to 2 drops to the affected eye  3 to 4 x /day as needed) 5 mL 0   No current facility-administered medications on  file prior to visit.     Allergies  Allergen Reactions  . Aspirin Swelling  . Celebrex [Celecoxib] Other (See Comments)    "my body lit up"  . Fosamax [Alendronate Sodium] Other (See Comments)    unknown  . Keflex [Cephalexin] Other (See Comments)    unknown  . Penicillins Other (See Comments)    unknown  . Prednisone Itching  . Prevacid [Lansoprazole] Other (See Comments)    unknown  .  Sulfa Antibiotics Other (See Comments)    unknown  . Tetanus Toxoids Other (See Comments)    unknown  . Tetracyclines & Related Nausea Only    Nausea     Current Problems (verified) Patient Active Problem List   Diagnosis Date Noted  . Elevated LFTs 08/26/2018  . Hypertensive retinopathy of both eyes 09/21/2017  . Depression, major, in remission ([place]) 06/22/2016  . COPD 08/15/2015  . GERD  08/15/2015  . Osteoporosis 07/16/2015  . Hypothyroidism   . Morbid obesity ([place]) 08/01/2014  . Medication management 03/20/2014  . Essential hypertension   . Hyperlipidemia, mixed   . Abnormal glucose   . Vitamin D deficiency   . Varicose veins of bilateral lower extremities with other complications [id number]    SURGICAL HISTORY She  has a past surgical history that includes Tonsillectomy and Vesicovaginal fistula closure w/ TAH. FAMILY HISTORY Her family history includes Diabetes in her father; Heart disease in her mother; Other in her brother and mother. SOCIAL HISTORY She  reports that she has never smoked. She has never used smokeless tobacco. She reports that she does not drink alcohol or use drugs.   Review of Systems  Constitutional: Negative.  Negative for malaise/fatigue.  HENT: Negative for congestion, hearing loss and sore throat.   Eyes: Negative for blurred vision.  Respiratory: Positive for sputum production (AM with awakening; clear). Negative for cough, shortness of breath and wheezing.   Cardiovascular: Negative for chest pain, palpitations, orthopnea, claudication and leg  swelling.  Gastrointestinal: Negative for abdominal pain, blood in stool, constipation, diarrhea, heartburn, melena, nausea and vomiting.  Genitourinary: Negative.  Negative for frequency.  Musculoskeletal: Negative for falls, joint pain and myalgias.  Skin: Negative.   Neurological: Negative for dizziness, tingling, tremors, sensory change, focal weakness, weakness and headaches.  Endo/Heme/Allergies: Negative.  Negative for polydipsia. Does not bruise/bleed easily.  Psychiatric/Behavioral: Negative.  Negative for depression, memory loss and suicidal ideas.     Objective:     Today's Vitals   09/25/19 1541  BP: 112/60  Pulse: (!) 111  Temp: 97.7 F (36.5 C)  SpO2: 94%  Weight: 183 lb (83 kg)   Body mass index is 34.58 kg/m.  General appearance: alert, no distress, WD/WN, elderly female with walker HEENT: normocephalic, sclerae anicteric, TMs pearly, nares patent, no discharge or erythema, pharynx normal Oral cavity: MMM, no lesions, upper and lower dentures present Neck: supple, no lymphadenopathy, no palpable thyromegaly, no masses Heart: RRR, normal S1, S2, 2/6 early systolic decreshendo murmur. Patient reports + history of murmur.  Lungs: CTA bilaterally, no wheezes, rhonchi, or rales Abdomen: +bs, soft, non tender, non distended, no masses, no hepatomegaly, no splenomegaly Musculoskeletal: nontender, no swelling, some kyphosis of upper back with walker use for ambulation Extremities: no edema (compression hose in place), no cyanosis, no clubbing Pulses: 2+ symmetric, upper and lower extremities, normal cap refill Neurological: alert, oriented x 3, CN2-12 intact, weak but symmetric upper and lower extremity strength, sensation normal throughout, DTRs 2+ throughout, no cerebellar signs, gait slow with walker Psychiatric: normal affect, behavior normal, pleasant     Izora Ribas, NP   09/25/2019

## 2019-09-25 ENCOUNTER — Ambulatory Visit (INDEPENDENT_AMBULATORY_CARE_PROVIDER_SITE_OTHER): Payer: Medicare Other | Admitting: Adult Health

## 2019-09-25 ENCOUNTER — Other Ambulatory Visit: Payer: Self-pay

## 2019-09-25 ENCOUNTER — Encounter: Payer: Self-pay | Admitting: Adult Health

## 2019-09-25 VITALS — BP 112/60 | HR 105 | Temp 97.7°F | Wt 183.0 lb

## 2019-09-25 DIAGNOSIS — I10 Essential (primary) hypertension: Secondary | ICD-10-CM | POA: Diagnosis not present

## 2019-09-25 DIAGNOSIS — K219 Gastro-esophageal reflux disease without esophagitis: Secondary | ICD-10-CM | POA: Diagnosis not present

## 2019-09-25 DIAGNOSIS — E782 Mixed hyperlipidemia: Secondary | ICD-10-CM | POA: Diagnosis not present

## 2019-09-25 DIAGNOSIS — Z79899 Other long term (current) drug therapy: Secondary | ICD-10-CM

## 2019-09-25 DIAGNOSIS — F325 Major depressive disorder, single episode, in full remission: Secondary | ICD-10-CM

## 2019-09-25 DIAGNOSIS — Z23 Encounter for immunization: Secondary | ICD-10-CM

## 2019-09-25 DIAGNOSIS — R7309 Other abnormal glucose: Secondary | ICD-10-CM

## 2019-09-25 DIAGNOSIS — E038 Other specified hypothyroidism: Secondary | ICD-10-CM | POA: Diagnosis not present

## 2019-09-25 DIAGNOSIS — J42 Unspecified chronic bronchitis: Secondary | ICD-10-CM

## 2019-09-25 DIAGNOSIS — E559 Vitamin D deficiency, unspecified: Secondary | ICD-10-CM

## 2019-09-25 DIAGNOSIS — Z6834 Body mass index (BMI) 34.0-34.9, adult: Secondary | ICD-10-CM

## 2019-09-25 NOTE — Addendum Note (Signed)
Addended by: Chancy Hurter on: 09/25/2019 04:13 PM   Modules accepted: Orders

## 2019-09-25 NOTE — Patient Instructions (Signed)
Goals    . Blood Pressure < 140/90    . Exercise 5 x per week (30 min per time)       Keep up the great work with daily exercise!     Preventing Influenza, Adult Influenza, more commonly known as "the flu," is a viral infection that mainly affects the respiratory tract. The respiratory tract includes structures that help you breathe, such as the lungs, nose, and throat. The flu causes many common cold symptoms, as well as a high fever and body aches. The flu spreads easily from person to person (is contagious). The flu is most common from December through March. This is called flu season.You can catch the flu virus by:  Breathing in droplets from an infected person's cough or sneeze.  Touching something that was recently contaminated with the virus and then touching your mouth, nose, or eyes. What can I do to lower my risk?        You can decrease your risk of getting the flu by:  Getting a flu shot (influenza vaccination) every year. This is the best way to prevent the flu. A flu shot is recommended for everyone age 69 months and older. ? It is best to get a flu shot in the fall, as soon as it is available. Getting a flu shot during winter or spring instead is still a good idea. Flu season can last into early spring. ? Preventing the flu through vaccination requires getting a new flu shot every year. This is because the flu virus changes slightly (mutates) from one year to the next. Even if a flu shot does not completely protect you from all flu virus mutations, it can reduce the severity of your illness and prevent dangerous complications of the flu. ? If you are pregnant, you can and should get a flu shot. ? If you have had a reaction to the shot in the past or if you are allergic to eggs, check with your health care provider before getting a flu shot. ? Sometimes the vaccine is available as a nasal spray. In some years, the nasal spray has not been as effective against the flu  virus. Check with your health care provider if you have questions about this.  Practicing good health habits. This is especially important during flu season. ? Avoid contact with people who are sick with flu or cold symptoms. ? Wash your hands with soap and water often. If soap and water are not available, use alcohol-based hand sanitizer. ? Avoid touching your hands to your face, especially when you have not washed your hands recently. ? Use a disinfectant to clean surfaces at home and at work that may be contaminated with the flu virus. ? Keep your body's disease-fighting system (immune system) in good shape by eating a healthy diet, drinking plenty of fluids, getting enough sleep, and exercising regularly. If you do get the flu, avoid spreading it to others by:  Staying home until your symptoms have been gone for at least one day.  Covering your mouth and nose when you cough or sneeze.  Avoiding close contact with others, especially babies and elderly people. Why are these changes important? Getting a flu shot and practicing good health habits protects you as well as other people. If you get the flu, your friends, family, and co-workers are also at risk of getting it, because it spreads so easily to others. Each year, about 2 out of every 10 people get the flu. Having  the flu can lead to complications, such as pneumonia, ear infection, and sinus infection. The flu also can be deadly, especially for babies, people older than age 37, and people who have serious long-term diseases. How is this treated? Most people recover from the flu by resting at home and drinking plenty of fluids. However, a prescription antiviral medicine may reduce your flu symptoms and may make your flu go away sooner. This medicine must be started within a few days of getting flu symptoms. You can talk with your health care provider about whether you need an antiviral medicine. Antiviral medicine may be prescribed for  people who are at risk for more serious flu symptoms. This includes people who:  Are older than age 76.  Are pregnant.  Have a condition that makes the flu worse or more dangerous. Where to find more information  Centers for Disease Control and Prevention: http://www.smith-bell.org/  [employer].[employer].ee: azureicus.com  American Academy of Family Physicians: familydoctor.org/familydoctor/en/kids/vaccines/preventing-the-flu.html Contact a health care provider if:  You have influenza and you develop new symptoms.  You have: ? Chest pain. ? Diarrhea. ? A fever.  Your cough gets worse, or you produce more mucus. Summary  The best way to prevent the flu is to get a flu shot every year in the fall.  Even if you get the flu after you have received the yearly vaccine, your flu may be milder and go away sooner because of your flu shot.  If you get the flu, antiviral medicines that are started with a few days of symptoms may reduce your flu symptoms and may make your flu go away sooner.  You can also help prevent the flu by practicing good health habits. This information is not intended to replace advice given to you by your health care provider. Make sure you discuss any questions you have with your health care provider. Document Released: 12/08/2015 Document Revised: 11/05/2017 Document Reviewed: 08/01/2016 Elsevier Patient Education  2020 [employer].

## 2019-09-26 LAB — COMPLETE METABOLIC PANEL WITH GFR
AG Ratio: 1.2 (calc) (ref 1.0–2.5)
ALT: 30 U/L — ABNORMAL HIGH (ref 6–29)
AST: 41 U/L — ABNORMAL HIGH (ref 10–35)
Albumin: 4.2 g/dL (ref 3.6–5.1)
Alkaline phosphatase (APISO): 51 U/L (ref 37–153)
BUN/Creatinine Ratio: 20 (calc) (ref 6–22)
BUN: 20 mg/dL (ref 7–25)
CO2: 29 mmol/L (ref 20–32)
Calcium: 10.3 mg/dL (ref 8.6–10.4)
Chloride: 95 mmol/L — ABNORMAL LOW (ref 98–110)
Creat: 1.02 mg/dL — ABNORMAL HIGH (ref 0.60–0.88)
GFR, Est African American: 57 mL/min/{1.73_m2} — ABNORMAL LOW (ref >=60)
GFR, Est Non African American: 49 mL/min/{1.73_m2} — ABNORMAL LOW (ref >=60)
Globulin: 3.4 g/dL (calc) (ref 1.9–3.7)
Glucose, Bld: 99 mg/dL (ref 65–99)
Potassium: 3.8 mmol/L (ref 3.5–5.3)
Sodium: 137 mmol/L (ref 135–146)
Total Bilirubin: 0.7 mg/dL (ref 0.2–1.2)
Total Protein: 7.6 g/dL (ref 6.1–8.1)

## 2019-09-26 LAB — CBC WITH DIFFERENTIAL/PLATELET
Absolute Monocytes: 546 cells/uL (ref 200–950)
Basophils Absolute: 51 cells/uL (ref 0–200)
Basophils Relative: 1 %
Eosinophils Absolute: 82 cells/uL (ref 15–500)
Eosinophils Relative: 1.6 %
HCT: 44.6 % (ref 35.0–45.0)
Hemoglobin: 14.9 g/dL (ref 11.7–15.5)
Lymphs Abs: 1867 cells/uL (ref 850–3900)
MCH: 30.7 pg (ref 27.0–33.0)
MCHC: 33.4 g/dL (ref 32.0–36.0)
MCV: 91.8 fL (ref 80.0–100.0)
MPV: 11.4 fL (ref 7.5–12.5)
Monocytes Relative: 10.7 %
Neutro Abs: 2555 cells/uL (ref 1500–7800)
Neutrophils Relative %: 50.1 %
Platelets: 172 10*3/uL (ref 140–400)
RBC: 4.86 10*6/uL (ref 3.80–5.10)
RDW: 12.7 % (ref 11.0–15.0)
Total Lymphocyte: 36.6 %
WBC: 5.1 10*3/uL (ref 3.8–10.8)

## 2019-09-26 LAB — MAGNESIUM: Magnesium: 1.8 mg/dL (ref 1.5–2.5)

## 2019-09-26 LAB — TSH: TSH: 2.59 mIU/L (ref 0.40–4.50)

## 2019-09-28 ENCOUNTER — Encounter: Payer: Self-pay | Admitting: Internal Medicine

## 2019-09-29 DIAGNOSIS — Z1231 Encounter for screening mammogram for malignant neoplasm of breast: Secondary | ICD-10-CM | POA: Diagnosis not present

## 2019-09-29 LAB — HM MAMMOGRAPHY

## 2019-10-21 ENCOUNTER — Other Ambulatory Visit: Payer: Self-pay | Admitting: Internal Medicine

## 2019-12-28 DIAGNOSIS — H538 Other visual disturbances: Secondary | ICD-10-CM | POA: Diagnosis not present

## 2019-12-28 DIAGNOSIS — H25041 Posterior subcapsular polar age-related cataract, right eye: Secondary | ICD-10-CM | POA: Diagnosis not present

## 2019-12-28 DIAGNOSIS — H40033 Anatomical narrow angle, bilateral: Secondary | ICD-10-CM | POA: Diagnosis not present

## 2019-12-28 DIAGNOSIS — H25013 Cortical age-related cataract, bilateral: Secondary | ICD-10-CM | POA: Diagnosis not present

## 2019-12-29 ENCOUNTER — Other Ambulatory Visit: Payer: Self-pay | Admitting: Adult Health

## 2019-12-29 ENCOUNTER — Encounter: Payer: Self-pay | Admitting: Adult Health

## 2019-12-29 ENCOUNTER — Other Ambulatory Visit: Payer: Medicare Other

## 2019-12-29 ENCOUNTER — Other Ambulatory Visit: Payer: Self-pay

## 2019-12-29 ENCOUNTER — Telehealth: Payer: Medicare Other | Admitting: Adult Health

## 2019-12-29 DIAGNOSIS — R300 Dysuria: Secondary | ICD-10-CM | POA: Diagnosis not present

## 2019-12-29 DIAGNOSIS — R8290 Unspecified abnormal findings in urine: Secondary | ICD-10-CM | POA: Diagnosis not present

## 2019-12-29 MED ORDER — CIPROFLOXACIN HCL 250 MG PO TABS: 250.0000 mg | ORAL_TABLET | Freq: Two times a day (BID) | ORAL | 0 refills | Status: AC

## 2019-12-29 NOTE — Patient Instructions (Signed)
Urinary Tract Infection, Adult A urinary tract infection (UTI) is an infection of any part of the urinary tract. The urinary tract includes:  The kidneys.  The ureters.  The bladder.  The urethra. These organs make, store, and get rid of pee (urine) in the body. What are the causes? This is caused by germs (bacteria) in your genital area. These germs grow and cause swelling (inflammation) of your urinary tract. What increases the risk? You are more likely to develop this condition if:  You have a small, thin tube (catheter) to drain pee.  You cannot control when you pee or poop (incontinence).  You are female, and: ? You use these methods to prevent pregnancy:  A medicine that kills sperm (spermicide).  A device that blocks sperm (diaphragm). ? You have low levels of a female hormone (estrogen). ? You are pregnant.  You have genes that add to your risk.  You are sexually active.  You take antibiotic medicines.  You have trouble peeing because of: ? A prostate that is bigger than normal, if you are female. ? A blockage in the part of your body that drains pee from the bladder (urethra). ? A kidney stone. ? A nerve condition that affects your bladder (neurogenic bladder). ? Not getting enough to drink. ? Not peeing often enough.  You have other conditions, such as: ? Diabetes. ? A weak disease-fighting system (immune system). ? Sickle cell disease. ? Gout. ? Injury of the spine. What are the signs or symptoms? Symptoms of this condition include:  Needing to pee right away (urgently).  Peeing often.  Peeing small amounts often.  Pain or burning when peeing.  Blood in the pee.  Pee that smells bad or not like normal.  Trouble peeing.  Pee that is cloudy.  Fluid coming from the vagina, if you are female.  Pain in the belly or lower back. Other symptoms include:  Throwing up (vomiting).  No urge to eat.  Feeling mixed up (confused).  Being tired  and grouchy (irritable).  A fever.  Watery poop (diarrhea). How is this treated? This condition may be treated with:  Antibiotic medicine.  Other medicines.  Drinking enough water. Follow these instructions at home:  Medicines  Take over-the-counter and prescription medicines only as told by your doctor.  If you were prescribed an antibiotic medicine, take it as told by your doctor. Do not stop taking it even if you start to feel better. General instructions  Make sure you: ? Pee until your bladder is empty. ? Do not hold pee for a long time. ? Empty your bladder after sex. ? Wipe from front to back after pooping if you are a female. Use each tissue one time when you wipe.  Drink enough fluid to keep your pee pale yellow.  Keep all follow-up visits as told by your doctor. This is important. Contact a doctor if:  You do not get better after 1-2 days.  Your symptoms go away and then come back. Get help right away if:  You have very bad back pain.  You have very bad pain in your lower belly.  You have a fever.  You are sick to your stomach (nauseous).  You are throwing up. Summary  A urinary tract infection (UTI) is an infection of any part of the urinary tract.  This condition is caused by germs in your genital area.  There are many risk factors for a UTI. These include having a small, thin   tube to drain pee and not being able to control when you pee or poop.  Treatment includes antibiotic medicines for germs.  Drink enough fluid to keep your pee pale yellow. This information is not intended to replace advice given to you by your health care provider. Make sure you discuss any questions you have with your health care provider. Document Revised: 11/10/2018 Document Reviewed: 06/02/2018 Elsevier Patient Education  2020 [employer] Ciprofloxacin tablets What is this medicine? CIPROFLOXACIN (sip roe FLOX a sin) is a quinolone antibiotic. It is used to treat  certain kinds of bacterial infections. It will not work for colds, flu, or other viral infections. This medicine may be used for other purposes; ask your health care provider or pharmacist if you have questions. COMMON BRAND NAME(S): Cipro What should I tell my health care provider before I take this medicine? They need to know if you have any of these conditions:  bone problems  diabetes  heart disease  high blood pressure  history of irregular heartbeat  history of low levels of potassium in the blood  joint problems  kidney disease  liver disease  mental illness  myasthenia gravis  seizures  tendon problems  tingling of the fingers or toes, or other nerve disorder  an unusual or allergic reaction to ciprofloxacin, other antibiotics or medicines, foods, dyes, or preservatives  pregnant or trying to get pregnant  breast-feeding How should I use this medicine? Take this medicine by mouth with a full glass of water. Follow the directions on the prescription label. You can take it with or without food. If it upsets your stomach, take it with food. Take your medicine at regular intervals. Do not take your medicine more often than directed. Take all of your medicine as directed even if you think you are better. Do not skip doses or stop your medicine early. Avoid antacids, aluminum, calcium, iron, magnesium, and zinc products for 6 hours before and 2 hours after taking a dose of this medicine. A special MedGuide will be given to you by the pharmacist with each prescription and refill. Be sure to read this information carefully each time. Talk to your pediatrician regarding the use of this medicine in children. Special care may be needed. Overdosage: If you think you have taken too much of this medicine contact a poison control center or emergency room at once. NOTE: This medicine is only for you. Do not share this medicine with others. What if I miss a dose? If you miss a  dose, take it as soon as you can. If it is almost time for your next dose, take only that dose. Do not take double or extra doses. What may interact with this medicine? Do not take this medicine with any of the following medications:  cisapride  dronedarone  flibanserin  lomitapide  pimozide  thioridazine  tizanidine This medicine may also interact with the following medications:  antacids  birth control pills  caffeine  certain medicines for diabetes, like glipizide, glyburide, or insulin  certain medicines that treat or prevent blood clots like warfarin  clozapine  cyclosporine  didanosine buffered tablets or powder  dofetilide  duloxetine  lanthanum carbonate  lidocaine  methotrexate  multivitamins  NSAIDS, medicines for pain and inflammation, like ibuprofen or naproxen  olanzapine  omeprazole  other medicines that prolong the QT interval (cause an abnormal heart rhythm)  phenytoin  probenecid  ropinirole  sevelamer  sildenafil  sucralfate  theophylline  ziprasidone  zolpidem This   list may not describe all possible interactions. Give your health care provider a list of all the medicines, herbs, non-prescription drugs, or dietary supplements you use. Also tell them if you smoke, drink alcohol, or use illegal drugs. Some items may interact with your medicine. What should I watch for while using this medicine? Tell your doctor or health care provider if your symptoms do not start to get better or if they get worse. This medicine may cause serious skin reactions. They can happen weeks to months after starting the medicine. Contact your health care provider right away if you notice fevers or flu-like symptoms with a rash. The rash may be red or purple and then turn into blisters or peeling of the skin. Or, you might notice a red rash with swelling of the face, lips or lymph nodes in your neck or under your arms. Do not treat diarrhea with over  the counter products. Contact your doctor if you have diarrhea that lasts more than 2 days or if it is severe and watery. Check with your doctor or health care provider if you get an attack of severe diarrhea, nausea and vomiting, or if you sweat a lot. The loss of too much body fluid can make it dangerous for you to take this medicine. This medicine may increase blood sugar. Ask your health care provider if changes in diet or medicines are needed if you have diabetes. You may get drowsy or dizzy. Do not drive, use machinery, or do anything that needs mental alertness until you know how this medicine affects you. Do not sit or stand up quickly, especially if you are an older patient. This reduces the risk of dizzy or fainting spells. This medicine can make you more sensitive to the sun. Keep out of the sun. If you cannot avoid being in the sun, wear protective clothing and use sunscreen. Do not use sun lamps or tanning beds/booths. What side effects may I notice from receiving this medicine? Side effects that you should report to your doctor or health care professional as soon as possible:  allergic reactions like skin rash or hives, swelling of the face, lips, or tongue  anxious  bloody or watery diarrhea  confusion  depressed mood  fast, irregular heartbeat  fever  hallucination, loss of contact with reality  joint, muscle, or tendon pain or swelling  loss of memory  pain, tingling, numbness in the hands or feet  redness, blistering, peeling or loosening of the skin, including inside the mouth  seizures  signs and symptoms of aortic dissection such as sudden chest, stomach, or back pain  signs and symptoms of high blood sugar such as being more thirsty or hungry or having to urinate more than normal. You may also feel very tired or have blurry vision.  signs and symptoms of liver injury like dark yellow or brown urine; general ill feeling or flu-like symptoms; light-colored  stools; loss of appetite; nausea; right upper belly pain; unusually weak or tired; yellowing of the eyes or skin  signs and symptoms of low blood sugar such as feeling anxious; confusion; dizziness; increased hunger; unusually weak or tired; sweating; shakiness; cold; irritable; headache; blurred vision; fast heartbeat; loss of consciousness; pale skin  suicidal thoughts or other mood changes  sunburn  unusually weak or tired Side effects that usually do not require medical attention (report to your doctor or health care professional if they continue or are bothersome):  dry mouth  headache  nausea  trouble   sleeping This list may not describe all possible side effects. Call your doctor for medical advice about side effects. You may report side effects to FDA at 1-800-FDA-1088. Where should I keep my medicine? Keep out of the reach of children. Store at room temperature below 30 degrees C (86 degrees F). Keep container tightly closed. Throw away any unused medicine after the expiration date. NOTE: This sheet is a summary. It may not cover all possible information. If you have questions about this medicine, talk to your doctor, pharmacist, or health care provider.  2020 Elsevier/Gold Standard (2019-02-23 11:26:08)  

## 2019-12-29 NOTE — Progress Notes (Signed)
Virtual Visit via Telephone Note  I connected with Alyssa Schmitt on 12/29/19 at 0900 by telephone and verified that I am speaking with the correct person using two identifiers.  Location: Patient: Home Provider: [facility] office    I discussed the limitations, risks, security and privacy concerns of performing an evaluation and management service by telephone and the availability of in person appointments. I also discussed with the patient that there may be a patient responsible charge related to this service. The patient expressed understanding and agreed to proceed.   History of Present Illness:  86 y.o. female with hx of htn, abnormal glucose, bladder prolapse contacts office reporting urinary symptoms, daughter Alyssa Schmitt, [place] is very concerned and requesting evaluation.   Per Alyssa Schmitt, the patient was feeling unwell Tuesday, lay in bed all day, family noted in the last few days she was unwilling to ambulate to bathroom, wanting to use bedside commode, very dark and strong odor despite pushing water (getting in 30 oz - which is apparently good for her). There were no other symptoms until yesterday, she experienced some dysuria, lower R sided pelvic pain and some back pain following a 2 hour doctor's visit. Daughter gave her 2 tabs of azo for discomfort which did help significantly, patient today states "I'm feeling better," denies any urinary symptoms today but daughter remains concerned due to urine odor and patient at high risk of complication if UTI was missed. Denies urgency, frequency, fever/chills, nausea, vomiting, hematuria, incontinence. Patient is very sedentary and lies in bed or in recliner most of the day.   No recent UTIs this past year on review.   She reports has sterile UA cup at home with hat and daughter can drop specimen off this AM.    Current Outpatient Medications on File Prior to Visit  Medication Sig Dispense Refill  . acetaminophen (TYLENOL) 325 MG tablet Take 2  tablets (650 mg total) by mouth every 6 (six) hours as needed (prn TEMP>38.1 Celsius). 30 tablet 2  . busPIRone (BUSPAR) 10 MG tablet TAKE 1/2 - 1 TABLET 3 TIMES A DAY AS NEEDED FOR ANXIETY 90 tablet 5  . calcium-vitamin D (OSCAL WITH D) 250-125 MG-UNIT per tablet Take 1 tablet by mouth daily.    . Cholecalciferol (VITAMIN D3) 2000 UNITS TABS Take 4 tablets by mouth daily.     . citalopram (CELEXA) 20 MG tablet Take 1/2 to one tablet daily 90 tablet 1  . fexofenadine (ALLEGRA) 180 MG tablet Take 180 mg by mouth daily.     . hydrochlorothiazide (HYDRODIURIL) 25 MG tablet Take 1 tablet Daily for BP & Fluid 90 tablet 3  . levothyroxine (SYNTHROID) 50 MCG tablet Take 1 tablet daily on an empty stomach with only water for 30 minutes & no Antacid meds, Calcium or Magnesium for 4 hours & avoid Biotin 90 tablet 3  . magnesium oxide (MAG-OX) 400 (241.3 MG) MG tablet Take 1 tablet (400 mg total) by mouth 2 (two) times daily. (Patient taking differently: Take 400 mg by mouth 2 (two) times daily. Takes one tablet daily) 60 tablet 2  . Multiple Vitamin (MULITIVITAMIN WITH MINERALS) TABS Take 1 tablet by mouth daily.    [name] Kitchen neomycin-polymyxin b-dexamethasone (MAXITROL) 3.5-10000-0.1 OINT     . nystatin (NYSTATIN) powder Apply topically 3 (three) times daily. 15 g 0  . omeprazole (PRILOSEC) 40 MG capsule TAKE 1 CAPSULE BY MOUTH EVERY DAY 90 capsule 4  . Probiotic Product (PROBIOTIC FORMULA PO) Take 1 tablet by mouth  daily.     . terazosin (HYTRIN) 10 MG capsule Take 1 tablet at Bedtime  for BP 90 capsule 3   No current facility-administered medications on file prior to visit.     Allergies:  Allergies  Allergen Reactions  . Aspirin Swelling  . Celebrex [Celecoxib] Other (See Comments)    "my body lit up"  . Fosamax [Alendronate Sodium] Other (See Comments)    unknown  . Keflex [Cephalexin] Other (See Comments)    unknown  . Penicillins Other (See Comments)    unknown  . Prednisone Itching  . Prevacid  [Lansoprazole] Other (See Comments)    unknown  . Sulfa Antibiotics Other (See Comments)    unknown  . Tetanus Toxoids Other (See Comments)    unknown  . Tetracyclines & Related Nausea Only    Nausea    Medical History:  has Varicose veins of bilateral lower extremities with other complications; Essential hypertension; Hyperlipidemia, mixed; Abnormal glucose; Vitamin D deficiency; Medication management; Hypothyroidism; Osteoporosis; COPD; GERD ; Depression, major, in remission ([place]); Hypertensive retinopathy of both eyes; and Elevated LFTs on their problem list. Surgical History:  She  has a past surgical history that includes Tonsillectomy and Vesicovaginal fistula closure w/ TAH. Family History:  Herfamily history includes Diabetes in her father; Heart disease in her mother; Other in her brother and mother. Social History:   reports that she has never smoked. She has never used smokeless tobacco. She reports that she does not drink alcohol or use drugs.   Observations/Objective:  General : Well sounding patient in no apparent distress HEENT: no hoarseness, no cough for duration of visit Lungs: speaks in complete sentences, no audible wheezing, no apparent distress Neurological: alert, oriented x 3 Psychiatric: pleasant, judgement appropriate   Assessment and Plan:  Diagnoses and all orders for this visit:  Dysuria Abnormal urine odor Daughter will drop off specimen, though with using hat and recent azo use discussed risk of inaccurate results In consideration of her recent symptoms, high risk of complications if missed heading into the weekend will proceed with presumptive treatment out of an abundance of caution PUSH fluids - encouraged 65+ fluid ounces daily  Follow up if symptoms not improving, and as needed. Present to ED for fever/chills, confusion, worsening sx over the weekend -     ciprofloxacin (CIPRO) 250 MG tablet; Take 1 tablet (250 mg total) by mouth 2 (two)  times daily for 5 days.   Follow Up Instructions:    I discussed the assessment and treatment plan with the patient. The patient was provided an opportunity to ask questions and all were answered. The patient agreed with the plan and demonstrated an understanding of the instructions.   The patient was advised to call back or seek an in-person evaluation if the symptoms worsen or if the condition fails to improve as anticipated.  I provided 15 minutes of non-face-to-face time during this encounter.   Izora Ribas, NP

## 2019-12-30 LAB — URINE CULTURE
MICRO NUMBER:: 10071787
SPECIMEN QUALITY:: ADEQUATE

## 2020-01-08 DIAGNOSIS — S62301A Unspecified fracture of second metacarpal bone, left hand, initial encounter for closed fracture: Secondary | ICD-10-CM | POA: Diagnosis not present

## 2020-01-29 DIAGNOSIS — S62301A Unspecified fracture of second metacarpal bone, left hand, initial encounter for closed fracture: Secondary | ICD-10-CM | POA: Diagnosis not present

## 2020-01-29 DIAGNOSIS — S52502A Unspecified fracture of the lower end of left radius, initial encounter for closed fracture: Secondary | ICD-10-CM | POA: Diagnosis not present

## 2020-02-05 ENCOUNTER — Encounter: Payer: Self-pay | Admitting: Internal Medicine

## 2020-02-05 NOTE — Progress Notes (Signed)
Annual Screening/Preventative Visit & Comprehensive Evaluation &  Examination     This very nice 86 y.o.  WWF  presents for a Screening /Preventative Visit & comprehensive evaluation and management of multiple medical co-morbidities.  Patient has been followed for HTN, HLD, Prediabetes  and Vitamin D Deficiency. Patient's GERD is controlled with her Prilosec. Patient's daughter(s) relate tat patient has had increasing difficulty ambulating with unstable gait requiring increased passivate to help prevent falls. And likewise patients physical strength and endurance has marked ly decreased.       Patient's HTN predates since 44. Patient's BP has been controlled at home and patient denies any cardiac symptoms as chest pain, palpitations, shortness of breath, dizziness or ankle swelling. Today's BP is at goal  - 116/80.      Patient's hyperlipidemia is controlled with diet. Last lipids were at goal:  Lab Results  Component Value Date   CHOL 179 06/21/2019   HDL 61 06/21/2019   LDLCALC 99 06/21/2019   TRIG 92 06/21/2019   CHOLHDL 2.9 06/21/2019       Patient has hx/o prediabetes (A1c 5.7% / 2014) and patient denies reactive hypoglycemic symptoms, visual blurring, diabetic polys or paresthesias. Last A1c was Normal & at goal:  Lab Results  Component Value Date   HGBA1C 5.4 06/21/2019      In 2011, patient was diagnosed Hypothyroid and started on Thyroid Replacement.     Finally, patient has history of Vitamin D Deficiency and last Vitamin D was at goal:  Lab Results  Component Value Date   VD25OH 94 06/21/2019    Current Outpatient Medications on File Prior to Visit  Medication Sig  . acetaminophen (TYLENOL) 325 MG tablet Take 2 tablets (650 mg total) by mouth every 6 (six) hours as needed (prn TEMP>38.1 Celsius).  . busPIRone (BUSPAR) 10 MG tablet TAKE 1/2 - 1 TABLET 3 TIMES A DAY AS NEEDED FOR ANXIETY  . calcium-vitamin D (OSCAL WITH D) 250-125 MG-UNIT per tablet Take 1 tablet  by mouth daily.  . Cholecalciferol (VITAMIN D3) 2000 UNITS TABS Take 4 tablets by mouth daily.   . citalopram (CELEXA) 20 MG tablet Take 1/2 to one tablet daily  . fexofenadine (ALLEGRA) 180 MG tablet Take 180 mg by mouth daily.   . hydrochlorothiazide (HYDRODIURIL) 25 MG tablet Take 1 tablet Daily for BP & Fluid  . levothyroxine (SYNTHROID) 50 MCG tablet Take 1 tablet daily on an empty stomach with only water for 30 minutes & no Antacid meds, Calcium or Magnesium for 4 hours & avoid Biotin  . magnesium oxide (MAG-OX) 400 (241.3 MG) MG tablet Take 1 tablet (400 mg total) by mouth 2 (two) times daily. (Patient taking differently: Take 400 mg by mouth 2 (two) times daily. Takes one tablet daily)  . Multiple Vitamin (MULITIVITAMIN WITH MINERALS) TABS Take 1 tablet by mouth daily.  [name] Kitchen omeprazole (PRILOSEC) 40 MG capsule TAKE 1 CAPSULE BY MOUTH EVERY DAY  . Probiotic Product (PROBIOTIC FORMULA PO) Take 1 tablet by mouth daily.   [name] Kitchen terazosin (HYTRIN) 10 MG capsule Take 1 tablet at Bedtime  for BP   No current facility-administered medications on file prior to visit.   Allergies  Allergen Reactions  . Aspirin Swelling  . Celebrex [Celecoxib] Other (See Comments)    "my body lit up"  . Fosamax [Alendronate Sodium] Other (See Comments)    unknown  . Keflex [Cephalexin] Other (See Comments)    unknown  . Penicillins Other (See Comments)  unknown  . Prednisone Itching  . Prevacid [Lansoprazole] Other (See Comments)    unknown  . Sulfa Antibiotics Other (See Comments)    unknown  . Tetanus Toxoids Other (See Comments)    unknown  . Tetracyclines & Related Nausea Only    Nausea    Past Medical History:  Diagnosis Date  . COPD (chronic obstructive pulmonary disease) ([place])   . GERD (gastroesophageal reflux disease)   . Hyperlipidemia   . Hypertension   . Morbid obesity ([place]) 08/01/2014  . Osteoporosis   . Prediabetes   . Vitamin D deficiency    Health Maintenance  Topic Date Due  .  MAMMOGRAM  09/28/2020  . INFLUENZA VACCINE  Completed  . DEXA SCAN  Completed  . PNA vac Low Risk Adult  Completed   Immunization History  Administered Date(s) Administered  . Influenza, High Dose Seasonal PF 09/05/2014, 09/17/2015, 08/25/2016, 09/22/2017, 08/25/2018, 09/25/2019  . Pneumococcal Conjugate-13 11/20/2015  . Pneumococcal Polysaccharide-23 12/18/2007  . Zoster 07/25/2013    Last Colon - 10/17/2010 Dr Watt Climes & patient declined recommended f/u's.  Last MGM - 09/29/2019  Past Surgical History:  Procedure Laterality Date  . TONSILLECTOMY     childhood  . VESICOVAGINAL FISTULA CLOSURE W/ TAH     Family History  Problem Relation Age of Onset  . Heart disease Mother   . Other Mother        VARICOSE VEINS  . Diabetes Father   . Other Brother        VARICOSE VEINS   Social History   Tobacco Use  . Smoking status: Never Smoker  . Smokeless tobacco: Never Used  Substance Use Topics  . Alcohol use: No  . Drug use: No    ROS Constitutional: Denies fever, chills, weight loss/gain, headaches, insomnia,  night sweats, and change in appetite. Does c/o fatigue. Eyes: Denies redness, blurred vision, diplopia, discharge, itchy, watery eyes.  ENT: Denies discharge, congestion, post nasal drip, epistaxis, sore throat, earache, hearing loss, dental pain, Tinnitus, Vertigo, Sinus pain, snoring.  Cardio: Denies chest pain, palpitations, irregular heartbeat, syncope, dyspnea, diaphoresis, orthopnea, PND, claudication, edema Respiratory: denies cough, dyspnea, DOE, pleurisy, hoarseness, laryngitis, wheezing.  Gastrointestinal: Denies dysphagia, heartburn, reflux, water brash, pain, cramps, nausea, vomiting, bloating, diarrhea, constipation, hematemesis, melena, hematochezia, jaundice, hemorrhoids Genitourinary: Denies dysuria, frequency, urgency, nocturia, hesitancy, discharge, hematuria, flank pain Breast: Breast lumps, nipple discharge, bleeding.  Musculoskeletal: Denies  arthralgia, myalgia, stiffness, Jt. Swelling, pain, limp, and strain/sprain. Denies falls. Skin: Denies puritis, rash, hives, warts, acne, eczema, changing in skin lesion Neuro: No weakness, tremor, incoordination, spasms, paresthesia, pain Psychiatric: Denies confusion, memory loss, sensory loss. Denies Depression. Endocrine: Denies change in weight, skin, hair change, nocturia, and paresthesia, diabetic polys, visual blurring, hyper / hypo glycemic episodes.  Heme/Lymph: No excessive bleeding, bruising, enlarged lymph nodes.  Physical Exam  BP 116/80   Pulse 80   Temp (!) 96.7 F (35.9 C)   Resp 16  Unable to stand for weighing  General Appearance: Appears chronically ill & wasted and in no apparent distress.  Eyes: PERRLA, EOMs, conjunctiva no swelling or erythema,  Fundi not visualized due to patient aversion & shutting eyes. Sinuses: No frontal/maxillary tenderness ENT/Mouth: EACs patent / TMs  nl. Nares clear without erythema, swelling, mucoid exudates. Oral hygiene is good. No erythema, swelling, or exudate. Tongue normal, non-obstructing. Tonsils not swollen or erythematous. Hearing normal.  Neck: Supple, thyroid not palpable. No bruits, nodes or JVD. Reversed cervical lordosis with head /chin tipped  forward & patient unable to hyperflex and look upward.  Respiratory: Respiratory effort normal.  BS equal and clear bilateral without rales, rhonci, wheezing or stridor. Cardio: Heart sounds are normal with regular rate and rhythm and no murmurs, rubs or gallops. Peripheral pulses are normal and equal bilaterally without edema. No aortic or femoral bruits. Chest: Moderately severe kyphosis and otherwise symmetric with normal excursions. Breasts: Symmetric, pendulous without lumps, nipple discharge, retractions, or fibrocystic changes.  Abdomen: Flat, soft with bowel sounds active. Nontender, no guarding, rebound, hernias, masses, or organomegaly.  Lymphatics: Non tender without  lymphadenopathy.  Musculoskeletal: Full ROM with severe diffuse decrease in muscle mass , power & tone. Patient in wheel chair and gait not tested due to suspected instability. Skin: Warm and dry without rashes, lesions, cyanosis, clubbing or  ecchymosis.  Neuro: Cranial nerves intact, reflexes equal bilaterally. Normal muscle tone, no cerebellar symptoms. Sensation intact.  Pysch: Alert and oriented x 3, pleasant affect with  Insight and Judgment limited.   Assessment and Plan  1. Annual Preventative Screening Examination  2. Essential hypertension  - EKG 12-Lead - Urinalysis, Routine w reflex microscopic - Microalbumin / creatinine urine ratio - CBC with Differential/Platelet - COMPLETE METABOLIC PANEL WITH GFR - Magnesium - TSH  3. Hyperlipidemia, mixed  - EKG 12-Lead - Lipid panel - TSH  4. Abnormal glucose  - EKG 12-Lead - Hemoglobin A1c  6. Hypothyroidism  - TSH  7. Gastroesophageal reflux disease  8. Screening for colorectal cancer  - POC Hemoccult Bld/Stl   9. Screening for ischemic heart disease  - EKG 12-Lead  10. Med management   - Urinalysis, Routine w reflex microscopic - Microalbumin / creatinine urine ratio - CBC with Differential/Platelet - COMPLETE METABOLIC PANEL WITH GFR - Magnesium - Lipid panel - TSH - Hemoglobin A1c - Insulin, random - VITAMIN D 25 Hydroxy       Will request homehealth assessment for Nursing, LPT & OT services.       Patient was counseled in prudent diet to  Maintain weight, BP monitoring, regular exercise and medications. Discussed med's effects and SE's. Screening labs and tests as requested with regular follow-up as recommended. Over 40 minutes of exam, counseling, chart review and high complex critical decision making was performed.   Kirtland Bouchard, MD

## 2020-02-05 NOTE — Patient Instructions (Signed)

## 2020-02-06 ENCOUNTER — Other Ambulatory Visit: Payer: Self-pay

## 2020-02-06 ENCOUNTER — Encounter: Payer: Self-pay | Admitting: Podiatry

## 2020-02-06 ENCOUNTER — Ambulatory Visit (INDEPENDENT_AMBULATORY_CARE_PROVIDER_SITE_OTHER): Payer: Medicare Other | Admitting: Internal Medicine

## 2020-02-06 ENCOUNTER — Ambulatory Visit (INDEPENDENT_AMBULATORY_CARE_PROVIDER_SITE_OTHER): Payer: Medicare Other | Admitting: Podiatry

## 2020-02-06 VITALS — BP 116/80 | HR 80 | Temp 96.7°F | Resp 16

## 2020-02-06 VITALS — BP 110/74 | HR 90 | Temp 97.2°F | Resp 16

## 2020-02-06 DIAGNOSIS — R5381 Other malaise: Secondary | ICD-10-CM

## 2020-02-06 DIAGNOSIS — Z0001 Encounter for general adult medical examination with abnormal findings: Secondary | ICD-10-CM

## 2020-02-06 DIAGNOSIS — E038 Other specified hypothyroidism: Secondary | ICD-10-CM | POA: Diagnosis not present

## 2020-02-06 DIAGNOSIS — R7309 Other abnormal glucose: Secondary | ICD-10-CM

## 2020-02-06 DIAGNOSIS — I10 Essential (primary) hypertension: Secondary | ICD-10-CM

## 2020-02-06 DIAGNOSIS — L89301 Pressure ulcer of unspecified buttock, stage 1: Secondary | ICD-10-CM

## 2020-02-06 DIAGNOSIS — R2681 Unsteadiness on feet: Secondary | ICD-10-CM

## 2020-02-06 DIAGNOSIS — Z79899 Other long term (current) drug therapy: Secondary | ICD-10-CM

## 2020-02-06 DIAGNOSIS — M79674 Pain in right toe(s): Secondary | ICD-10-CM | POA: Diagnosis not present

## 2020-02-06 DIAGNOSIS — R627 Adult failure to thrive: Secondary | ICD-10-CM

## 2020-02-06 DIAGNOSIS — R7303 Prediabetes: Secondary | ICD-10-CM

## 2020-02-06 DIAGNOSIS — M2141 Flat foot [pes planus] (acquired), right foot: Secondary | ICD-10-CM | POA: Diagnosis not present

## 2020-02-06 DIAGNOSIS — Z0000 Encounter for general adult medical examination without abnormal findings: Secondary | ICD-10-CM

## 2020-02-06 DIAGNOSIS — E782 Mixed hyperlipidemia: Secondary | ICD-10-CM | POA: Diagnosis not present

## 2020-02-06 DIAGNOSIS — R296 Repeated falls: Secondary | ICD-10-CM

## 2020-02-06 DIAGNOSIS — M79675 Pain in left toe(s): Secondary | ICD-10-CM | POA: Diagnosis not present

## 2020-02-06 DIAGNOSIS — M2142 Flat foot [pes planus] (acquired), left foot: Secondary | ICD-10-CM

## 2020-02-06 DIAGNOSIS — Z1212 Encounter for screening for malignant neoplasm of rectum: Secondary | ICD-10-CM

## 2020-02-06 DIAGNOSIS — Z1211 Encounter for screening for malignant neoplasm of colon: Secondary | ICD-10-CM

## 2020-02-06 DIAGNOSIS — Z136 Encounter for screening for cardiovascular disorders: Secondary | ICD-10-CM

## 2020-02-06 DIAGNOSIS — B351 Tinea unguium: Secondary | ICD-10-CM

## 2020-02-06 DIAGNOSIS — Z6837 Body mass index (BMI) 37.0-37.9, adult: Secondary | ICD-10-CM

## 2020-02-06 DIAGNOSIS — I700 Atherosclerosis of aorta: Secondary | ICD-10-CM

## 2020-02-06 DIAGNOSIS — E559 Vitamin D deficiency, unspecified: Secondary | ICD-10-CM

## 2020-02-06 DIAGNOSIS — K219 Gastro-esophageal reflux disease without esophagitis: Secondary | ICD-10-CM

## 2020-02-06 NOTE — Patient Instructions (Signed)

## 2020-02-07 LAB — TSH: TSH: 3.21 mIU/L (ref 0.40–4.50)

## 2020-02-07 LAB — CBC WITH DIFFERENTIAL/PLATELET
Absolute Monocytes: 499 cells/uL (ref 200–950)
Basophils Absolute: 57 cells/uL (ref 0–200)
Basophils Relative: 1.1 %
Eosinophils Absolute: 42 cells/uL (ref 15–500)
Eosinophils Relative: 0.8 %
HCT: 44.2 % (ref 35.0–45.0)
Hemoglobin: 14.9 g/dL (ref 11.7–15.5)
Lymphs Abs: 1420 cells/uL (ref 850–3900)
MCH: 30.6 pg (ref 27.0–33.0)
MCHC: 33.7 g/dL (ref 32.0–36.0)
MCV: 90.8 fL (ref 80.0–100.0)
MPV: 11 fL (ref 7.5–12.5)
Monocytes Relative: 9.6 %
Neutro Abs: 3182 cells/uL (ref 1500–7800)
Neutrophils Relative %: 61.2 %
Platelets: 170 10*3/uL (ref 140–400)
RBC: 4.87 10*6/uL (ref 3.80–5.10)
RDW: 12 % (ref 11.0–15.0)
Total Lymphocyte: 27.3 %
WBC: 5.2 10*3/uL (ref 3.8–10.8)

## 2020-02-07 LAB — COMPLETE METABOLIC PANEL WITH GFR
AG Ratio: 1.3 (calc) (ref 1.0–2.5)
ALT: 21 U/L (ref 6–29)
AST: 36 U/L — ABNORMAL HIGH (ref 10–35)
Albumin: 4.2 g/dL (ref 3.6–5.1)
Alkaline phosphatase (APISO): 59 U/L (ref 37–153)
BUN/Creatinine Ratio: 21 (calc) (ref 6–22)
BUN: 22 mg/dL (ref 7–25)
CO2: 27 mmol/L (ref 20–32)
Calcium: 10.3 mg/dL (ref 8.6–10.4)
Chloride: 98 mmol/L (ref 98–110)
Creat: 1.05 mg/dL — ABNORMAL HIGH (ref 0.60–0.88)
GFR, Est African American: 55 mL/min/{1.73_m2} — ABNORMAL LOW (ref >=60)
GFR, Est Non African American: 47 mL/min/{1.73_m2} — ABNORMAL LOW (ref >=60)
Globulin: 3.3 g/dL (calc) (ref 1.9–3.7)
Glucose, Bld: 108 mg/dL — ABNORMAL HIGH (ref 65–99)
Potassium: 4.1 mmol/L (ref 3.5–5.3)
Sodium: 139 mmol/L (ref 135–146)
Total Bilirubin: 0.8 mg/dL (ref 0.2–1.2)
Total Protein: 7.5 g/dL (ref 6.1–8.1)

## 2020-02-07 LAB — LIPID PANEL
Cholesterol: 192 mg/dL (ref <200)
HDL: 55 mg/dL (ref >=50)
LDL Cholesterol (Calc): 118 mg/dL (calc) — ABNORMAL HIGH
Non-HDL Cholesterol (Calc): 137 mg/dL (calc) — ABNORMAL HIGH (ref <130)
Total CHOL/HDL Ratio: 3.5 (calc) (ref <5.0)
Triglycerides: 87 mg/dL (ref <150)

## 2020-02-07 LAB — HEMOGLOBIN A1C
Hgb A1c MFr Bld: 5.3 % of total Hgb (ref <5.7)
Mean Plasma Glucose: 105 (calc)
eAG (mmol/L): 5.8 (calc)

## 2020-02-07 LAB — MAGNESIUM: Magnesium: 1.9 mg/dL (ref 1.5–2.5)

## 2020-02-07 LAB — VITAMIN D 25 HYDROXY (VIT D DEFICIENCY, FRACTURES): Vit D, 25-Hydroxy: 115 ng/mL — ABNORMAL HIGH (ref 30–100)

## 2020-02-07 LAB — INSULIN, RANDOM: Insulin: 16.4 u[IU]/mL

## 2020-02-11 NOTE — Progress Notes (Signed)
Subjective: Alyssa Schmitt presents today referred by Unk Pinto, MD for complaint of painful mycotic nails b/l that are difficult to trim. Pain interferes with ambulation. Duration greater than one month.  Aggravating factors include wearing enclosed shoe gear.   She denies any history of foot wounds.  Past Medical History:  Diagnosis Date  . COPD (chronic obstructive pulmonary disease) ([place])   . GERD (gastroesophageal reflux disease)   . Hyperlipidemia   . Hypertension   . Morbid obesity ([place]) 08/01/2014  . Osteoporosis   . Prediabetes   . Vitamin D deficiency      Patient Active Problem List   Diagnosis Date Noted  . Elevated LFTs 08/26/2018  . Hypertensive retinopathy of both eyes 09/21/2017  . Depression, major, in remission ([place]) 06/22/2016  . COPD 08/15/2015  . GERD  08/15/2015  . Osteoporosis 07/16/2015  . Hypothyroidism   . Medication management 03/20/2014  . Essential hypertension   . Hyperlipidemia, mixed   . Abnormal glucose   . Vitamin D deficiency   . Varicose veins of bilateral lower extremities with other complications [id number]     Past Surgical History:  Procedure Laterality Date  . TONSILLECTOMY     childhood  . VESICOVAGINAL FISTULA CLOSURE W/ TAH       Current Outpatient Medications on File Prior to Visit  Medication Sig Dispense Refill  . acetaminophen (TYLENOL) 325 MG tablet Take 2 tablets (650 mg total) by mouth every 6 (six) hours as needed (prn TEMP>38.1 Celsius). 30 tablet 2  . busPIRone (BUSPAR) 10 MG tablet TAKE 1/2 - 1 TABLET 3 TIMES A DAY AS NEEDED FOR ANXIETY 90 tablet 5  . calcium-vitamin D (OSCAL WITH D) 250-125 MG-UNIT per tablet Take 1 tablet by mouth daily.    . Cholecalciferol (VITAMIN D3) 2000 UNITS TABS Take 4 tablets by mouth daily.     . citalopram (CELEXA) 20 MG tablet Take 1/2 to one tablet daily 90 tablet 1  . fexofenadine (ALLEGRA) 180 MG tablet Take 180 mg by mouth daily.     . hydrochlorothiazide (HYDRODIURIL) 25  MG tablet Take 1 tablet Daily for BP & Fluid 90 tablet 3  . levothyroxine (SYNTHROID) 50 MCG tablet Take 1 tablet daily on an empty stomach with only water for 30 minutes & no Antacid meds, Calcium or Magnesium for 4 hours & avoid Biotin 90 tablet 3  . magnesium oxide (MAG-OX) 400 (241.3 MG) MG tablet Take 1 tablet (400 mg total) by mouth 2 (two) times daily. (Patient taking differently: Take 400 mg by mouth 2 (two) times daily. Takes one tablet daily) 60 tablet 2  . Multiple Vitamin (MULITIVITAMIN WITH MINERALS) TABS Take 1 tablet by mouth daily.    [name] Kitchen omeprazole (PRILOSEC) 40 MG capsule TAKE 1 CAPSULE BY MOUTH EVERY DAY 90 capsule 4  . Probiotic Product (PROBIOTIC FORMULA PO) Take 1 tablet by mouth daily.     [name] Kitchen terazosin (HYTRIN) 10 MG capsule Take 1 tablet at Bedtime  for BP 90 capsule 3   No current facility-administered medications on file prior to visit.     Allergies  Allergen Reactions  . Aspirin Swelling  . Celebrex [Celecoxib] Other (See Comments)    "my body lit up"  . Fosamax [Alendronate Sodium] Other (See Comments)    unknown  . Keflex [Cephalexin] Other (See Comments)    unknown  . Penicillins Other (See Comments)    unknown  . Prednisone Itching  . Prevacid [Lansoprazole] Other (See Comments)  unknown  . Sulfa Antibiotics Other (See Comments)    unknown  . Tetanus Toxoids Other (See Comments)    unknown  . Tetracyclines & Related Nausea Only    Nausea      Social History   Occupational History  . Not on file  Tobacco Use  . Smoking status: Never Smoker  . Smokeless tobacco: Never Used  Substance and Sexual Activity  . Alcohol use: No  . Drug use: No  . Sexual activity: Not on file     Family History  Problem Relation Age of Onset  . Heart disease Mother   . Other Mother        VARICOSE VEINS  . Diabetes Father   . Other Brother        VARICOSE VEINS     Immunization History  Administered Date(s) Administered  . Influenza, High Dose Seasonal  PF 09/05/2014, 09/17/2015, 08/25/2016, 09/22/2017, 08/25/2018, 09/25/2019  . Pneumococcal Conjugate-13 11/20/2015  . Pneumococcal Polysaccharide-23 12/18/2007  . Zoster 07/25/2013     Objective: Vitals:   02/06/20 1344  BP: 110/74  Pulse: 90  Resp: 16  Temp: (!) 97.2 F (36.2 C)   Pt 86 y.o. Caucasian female, WD, WN in NAD.  AAO x 3.   Vascular Examination:  Capillary refill time to digits immediate b/l. Palpable DP pulses b/l. Palpable PT pulses b/l. Pedal hair sparse b/l. Skin temperature gradient within normal limits b/l. Evidence of chronic venous insufficiency b/l LE.  Dermatological Examination: Pedal skin with normal turgor, texture and tone bilaterally. No open wounds bilaterally. No interdigital macerations bilaterally. Toenails 1-5 b/l elongated, dystrophic, thickened, crumbly with subungual debris and tenderness to dorsal palpation.  Musculoskeletal: Normal muscle strength 5/5 to all lower extremity muscle groups bilaterally, no pain crepitus or joint limitation noted with ROM b/l and pes planus deformity noted  Neurological: Protective sensation intact 5/5 intact bilaterally with 10g monofilament b/l Vibratory sensation intact b/l  Assessment: 1. Pain due to onychomycosis of toenails of both feet   2. Pes planus of both feet   3. Prediabetes      Plan: -Toenails 1-5 b/l were debrided in length and girth with sterile nail nippers and dremel without iatrogenic bleeding.  -Patient to continue soft, supportive shoe gear daily. -Patient to report any pedal injuries to medical professional immediately. -Patient/POA to call should there be question/concern in the interim.  Return in about 3 months (around 05/08/2020) for nail trim.

## 2020-02-14 DIAGNOSIS — R7309 Other abnormal glucose: Secondary | ICD-10-CM | POA: Diagnosis not present

## 2020-02-14 DIAGNOSIS — E782 Mixed hyperlipidemia: Secondary | ICD-10-CM | POA: Diagnosis not present

## 2020-02-14 DIAGNOSIS — I10 Essential (primary) hypertension: Secondary | ICD-10-CM | POA: Diagnosis not present

## 2020-02-14 DIAGNOSIS — E559 Vitamin D deficiency, unspecified: Secondary | ICD-10-CM | POA: Diagnosis not present

## 2020-02-14 DIAGNOSIS — E038 Other specified hypothyroidism: Secondary | ICD-10-CM | POA: Diagnosis not present

## 2020-02-15 ENCOUNTER — Other Ambulatory Visit: Payer: Self-pay | Admitting: Internal Medicine

## 2020-02-15 DIAGNOSIS — N3001 Acute cystitis with hematuria: Secondary | ICD-10-CM

## 2020-02-15 LAB — URINALYSIS, ROUTINE W REFLEX MICROSCOPIC
Bilirubin Urine: NEGATIVE
Glucose, UA: NEGATIVE
Hyaline Cast: NONE SEEN /LPF
Ketones, ur: NEGATIVE
Nitrite: NEGATIVE
Specific Gravity, Urine: 1.022 (ref 1.001–1.03)
pH: 6.5 (ref 5.0–8.0)

## 2020-02-15 MED ORDER — CIPROFLOXACIN HCL 250 MG PO TABS: ORAL_TABLET | ORAL | 0 refills | Status: DC

## 2020-02-19 ENCOUNTER — Other Ambulatory Visit: Payer: Self-pay

## 2020-02-19 ENCOUNTER — Encounter: Payer: Self-pay | Admitting: Internal Medicine

## 2020-02-19 ENCOUNTER — Ambulatory Visit (INDEPENDENT_AMBULATORY_CARE_PROVIDER_SITE_OTHER): Payer: Medicare Other | Admitting: Internal Medicine

## 2020-02-19 VITALS — BP 102/68 | HR 112 | Temp 97.0°F | Resp 16

## 2020-02-19 DIAGNOSIS — S8012XA Contusion of left lower leg, initial encounter: Secondary | ICD-10-CM | POA: Diagnosis not present

## 2020-02-19 NOTE — Progress Notes (Signed)
   History of Present Illness:    This delightful 86 yo WWF present with concern re: development of a hematoma of the proximal Left shin. Patient has no recall of injury or trauma. Denies any pain .  Medications  .  levothyroxine (SYNTHROID) 50 MCG tablet, Take 1 tablet daily on an empty stomach with only water for 30 minutes & no Antacid meds, Calcium or Magnesium for 4 hours & avoid Biotin .  hydrochlorothiazide (HYDRODIURIL) 25 MG tablet, Take 1 tablet Daily for BP & Fluid .  terazosin (HYTRIN) 10 MG capsule, Take 1 tablet at Bedtime  for BP .  fexofenadine (ALLEGRA) 180 MG tablet, Take 180 mg by mouth daily.  [name] Kitchen  acetaminophen (TYLENOL) 325 MG tablet, Take 2 tablets (650 mg total) by mouth every 6 (six) hours as needed (prn TEMP>38.1 Celsius).  .  busPIRone (BUSPAR) 10 MG tablet, TAKE 1/2 - 1 TABLET 3 TIMES A DAY AS NEEDED FOR ANXIETY .  calcium-vitamin D (OSCAL WITH D) 250-125 MG-UNIT per tablet, Take 1 tablet by mouth daily. .  Cholecalciferol (VITAMIN D3) 2000 UNITS TABS, Take 4 tablets by mouth daily.  .  ciprofloxacin (CIPRO) 250 MG tablet, Take 1 tablet 2 x /day with Food for Infection .  citalopram (CELEXA) 20 MG tablet, Take 1/2 to one tablet daily .  magnesium oxide (MAG-OX) 400 (241.3 MG) MG tablet, Take 1 tablet (400 mg total) by mouth 2 (two) times daily. (Patient taking differently: Take 400 mg by mouth 2 (two) times daily. Takes one tablet daily) .  Multiple Vitamin (MULITIVITAMIN WITH MINERALS) TABS, Take 1 tablet by mouth daily. [name] Kitchen  omeprazole (PRILOSEC) 40 MG capsule, TAKE 1 CAPSULE BY MOUTH EVERY DAY .  Probiotic Product (PROBIOTIC FORMULA PO), Take 1 tablet by mouth daily.   Problem list She has Varicose veins of bilateral lower extremities with other complications; Essential hypertension; Hyperlipidemia, mixed; Abnormal glucose; Vitamin D deficiency; Medication management; Hypothyroidism; Osteoporosis; COPD; GERD ; Depression, major, in remission ([place]); Hypertensive  retinopathy of both eyes; and Elevated LFTs on their problem list.   Observations/Objective:  BP 102/68   Pulse (!) 112   Temp (!) 97 F (36.1 C)   Resp 16   HEENT - WNL. Neck - supple.  Chest - Clear equal BS. Cor - Nl HS. RRR  PP 1(+). No edema. MS- FROM w/o deformities.   In wheelchair. Neuro -  Nl w/o focal abnormalities. Skin -  There is a 1.5" x 2.0" sl raised hematoma of the proximal left anterior shin with surrounding ecchymoses. No tenderness. No sign of infection.    The area was cleansed with H2O2 and painted with Betadine solution. Then the area was covered with a 4" x *" Tegaderm to protect the skin over the superficial hematoma. Daughter instructed to purchade wound protective pads to use.   Assessment and Plan:   1. Hematoma of left lower leg   - Wound dressing.  ROV as necessary.    Follow Up Instructions:    I discussed the assessment and treatment plan with the patient. The patient was provided an opportunity to ask questions and all were answered. The patient agreed with the plan and demonstrated an understanding of the instructions.  The patient was advised to call back or seek an in-person evaluation if the symptoms worsen or if the condition fails to improve as anticipated.  Kirtland Bouchard, MD

## 2020-02-19 NOTE — Patient Instructions (Signed)
Hematoma A hematoma is a collection of blood under the skin, in an organ, in a body space, in a joint space, or in other tissue. The blood can thicken (clot) to form a lump that you can see and feel. The lump is often firm and may become sore and tender. Most hematomas get better in a few days to weeks. However, some hematomas may be serious and require medical care. Hematomas can range from very small to very large. What are the causes? This condition is caused by:  A blunt or penetrating injury.  A leakage from a blood vessel under the skin.  Some medical procedures, including surgeries, such as oral surgery, face lifts, and surgeries on the joints.  Some medical conditions that cause bleeding or bruising. There may be multiple hematomas that appear in different areas of the body. What increases the risk? You are more likely to develop this condition if:  You are an older adult.  You use blood thinners. What are the signs or symptoms?  Symptoms of this condition depend on where the hematoma is located.  Common symptoms of a hematoma that is under the skin include:  A firm lump on the body.  Pain and tenderness in the area.  Bruising. Blue, dark blue, purple-red, or yellowish skin (discoloration) may appear at the site of the hematoma if the hematoma is close to the surface of the skin. Common symptoms of a hematoma that is deep in the tissues or body spaces may be less obvious. They include:  A collection of blood in the stomach (intra-abdominal hematoma). This may cause pain in the abdomen, weakness, fainting, and shortness of breath.  A collection of blood in the head (intracranial hematoma). This may cause a headache or symptoms such as weakness, trouble speaking or understanding, or a change in consciousness. How is this diagnosed? This condition is diagnosed based on:  Your medical history.  A physical exam.  Imaging tests, such as an ultrasound or CT scan. These may  be needed if your health care provider suspects a hematoma in deeper tissues or body spaces.  Blood tests. These may be needed if your health care provider believes that the hematoma is caused by a medical condition. How is this treated? Treatment for this condition depends on the cause, size, and location of the hematoma. Treatment may include:  Doing nothing. The majority of hematomas do not need treatment as many of them go away on their own over time.  Surgery or close monitoring. This may be needed for large hematomas or hematomas that affect vital organs.  Medicines. Medicines may be given if there is an underlying medical cause for the hematoma. Follow these instructions at home: Managing pain, stiffness, and swelling   If directed, put ice on the affected area. ? Put ice in a plastic bag. ? Place a towel between your skin and the bag. ? Leave the ice on for 20 minutes, 2-3 times a day for the first couple of days.  If directed, apply heat to the affected area after applying ice for a couple of days. Use the heat source that your health care provider recommends, such as a moist heat pack or a heating pad. ? Place a towel between your skin and the heat source. ? Leave the heat on for 20-30 minutes. ? Remove the heat if your skin turns bright red. This is especially important if you are unable to feel pain, heat, or cold. You may have a greater   risk of getting burned.  Raise (elevate) the affected area above the level of your heart while you are sitting or lying down.  If told, wrap the affected area with an elastic bandage. The bandage applies pressure (compression) to the area, which may help to reduce swelling and promote healing. Do not wrap the bandage too tightly around the affected area.  If your hematoma is on a leg or foot (lower extremity) and is painful, your health care provider may recommend crutches. Use them as told by your health care provider. General  instructions  Take over-the-counter and prescription medicines only as told by your health care provider.  Keep all follow-up visits as told by your health care provider. This is important. Contact a health care provider if:  You have a fever.  The swelling or discoloration gets worse.  You develop more hematomas. Get help right away if:  Your pain is worse or your pain is not controlled with medicine.  Your skin over the hematoma breaks or starts bleeding.  Your hematoma is in your chest or abdomen and you have weakness, shortness of breath, or a change in consciousness.  You have a hematoma on your scalp that is caused by a fall or injury, and you also have: ? A headache that gets worse. ? Trouble speaking or understanding speech. ? Weakness. ? Change in alertness or consciousness. Summary  A hematoma is a collection of blood under the skin, in an organ, in a body space, in a joint space, or in other tissue.  This condition usually does not need treatment because many hematomas go away on their own over time.  Large hematomas, or those that may affect vital organs, may need surgical drainage or monitoring. If the hematoma is caused by a medical condition, medicines may be prescribed.  Get help right away if your hematoma breaks or starts to bleed, you have shortness of breath, or you have a headache or trouble speaking after a fall.

## 2020-02-20 DIAGNOSIS — S52502D Unspecified fracture of the lower end of left radius, subsequent encounter for closed fracture with routine healing: Secondary | ICD-10-CM | POA: Diagnosis not present

## 2020-02-20 DIAGNOSIS — S62301D Unspecified fracture of second metacarpal bone, left hand, subsequent encounter for fracture with routine healing: Secondary | ICD-10-CM | POA: Diagnosis not present

## 2020-03-05 ENCOUNTER — Other Ambulatory Visit: Payer: Self-pay | Admitting: Internal Medicine

## 2020-03-05 ENCOUNTER — Telehealth: Payer: Self-pay | Admitting: *Deleted

## 2020-03-05 DIAGNOSIS — R627 Adult failure to thrive: Secondary | ICD-10-CM | POA: Diagnosis not present

## 2020-03-05 DIAGNOSIS — J449 Chronic obstructive pulmonary disease, unspecified: Secondary | ICD-10-CM | POA: Diagnosis not present

## 2020-03-05 DIAGNOSIS — E039 Hypothyroidism, unspecified: Secondary | ICD-10-CM | POA: Diagnosis not present

## 2020-03-05 DIAGNOSIS — Z9181 History of falling: Secondary | ICD-10-CM | POA: Diagnosis not present

## 2020-03-05 DIAGNOSIS — M80032D Age-related osteoporosis with current pathological fracture, left forearm, subsequent encounter for fracture with routine healing: Secondary | ICD-10-CM | POA: Diagnosis not present

## 2020-03-05 DIAGNOSIS — I10 Essential (primary) hypertension: Secondary | ICD-10-CM | POA: Diagnosis not present

## 2020-03-05 DIAGNOSIS — W19XXXD Unspecified fall, subsequent encounter: Secondary | ICD-10-CM | POA: Diagnosis not present

## 2020-03-05 DIAGNOSIS — E559 Vitamin D deficiency, unspecified: Secondary | ICD-10-CM | POA: Diagnosis not present

## 2020-03-05 DIAGNOSIS — M40209 Unspecified kyphosis, site unspecified: Secondary | ICD-10-CM | POA: Diagnosis not present

## 2020-03-05 MED ORDER — FLUCONAZOLE 150 MG PO TABS: ORAL_TABLET | ORAL | 3 refills | Status: DC

## 2020-03-05 NOTE — Telephone Encounter (Signed)
Daughter called and reported the patient cannot take the Cipro and still has burning and vaginal itching. Dr Melford Aase sent in an RX for Fluconazole tablets to take 1 tablet weekly for 2 weeks. A message was left to inform the patient's daughter.

## 2020-03-06 ENCOUNTER — Ambulatory Visit (INDEPENDENT_AMBULATORY_CARE_PROVIDER_SITE_OTHER): Payer: Medicare Other | Admitting: Adult Health

## 2020-03-06 ENCOUNTER — Encounter: Payer: Self-pay | Admitting: Adult Health

## 2020-03-06 ENCOUNTER — Telehealth: Payer: Self-pay | Admitting: *Deleted

## 2020-03-06 ENCOUNTER — Other Ambulatory Visit: Payer: Self-pay

## 2020-03-06 VITALS — BP 108/68 | HR 90 | Temp 97.5°F

## 2020-03-06 DIAGNOSIS — N8110 Cystocele, unspecified: Secondary | ICD-10-CM | POA: Diagnosis not present

## 2020-03-06 DIAGNOSIS — L89301 Pressure ulcer of unspecified buttock, stage 1: Secondary | ICD-10-CM

## 2020-03-06 DIAGNOSIS — Z87440 Personal history of urinary (tract) infections: Secondary | ICD-10-CM

## 2020-03-06 DIAGNOSIS — R102 Pelvic and perineal pain: Secondary | ICD-10-CM | POA: Diagnosis not present

## 2020-03-06 HISTORY — DX: Pressure ulcer of unspecified buttock, stage 1: L89.301

## 2020-03-06 MED ORDER — PHENAZOPYRIDINE HCL 100 MG PO TABS: ORAL_TABLET | ORAL | 0 refills | Status: DC

## 2020-03-06 NOTE — Progress Notes (Signed)
Assessment and Plan:  Nikkea was seen today for dysuria.  Diagnoses and all orders for this visit:  Pelvic pain Acquired female bladder prolapse History of recurrent UTIs Complete bladder prolapse on exam; will check urine to r/o recurrent UTI, however with complete bladder prolapse, expect discomfort with urination, frequent UTIs Azo has apparently been helpful Discussed possible need for follow up with GYN, may be worth trying to refit for pessary, doubt she would be a good surgical candidate  In the interim use glove to reduce, keep perineum as clean as possible Hydrate well and encourage frequent urination Consider vitamin C 1000 mg BID which may help reduce frequency of UTIs Abx pending culture results Declines referral to uro/gyn at this time  -     Urinalysis, Routine w reflex microscopic -     Urine Culture -     phenazopyridine (PYRIDIUM) 100 MG tablet; 1-2 tabs up to 3 times a day as needed for bladder/pelvic pain.  Pressure injury of buttock, stage 1, left Continue barrier cream, hygiene reviewed, recommended q1-2 hour position changes, donut pillow for offloading; monitor closely and follow up for evaluation of breakdown is progressive  Reviewed with daughter who is primary caregiver   Further disposition pending results of labs. Discussed med's effects and SE's.   Over 15 minutes of exam, counseling, chart review, and critical decision making was performed.   Future Appointments  Date Time Provider [place]  03/12/2020  3:00 PM [facility] NURSE [facility] None  05/08/2020  2:15 PM Marzetta Board, DPM [facility]  05/09/2020 10:00 AM [name], NP [facility] None  08/19/2020  3:30 PM Unk Pinto, MD [facility] None    ------------------------------------------------------------------------------------------------------------------   HPI BP 108/68   Pulse 90   Temp (!) 97.5 F (36.4 C)   SpO2 92%   86 y.o.female with hx of  vesicovaginal fistula closure with TAH remotely presents for follow up due to intermittent pelvic pain following presumptive UTI treatment with cipro for reports of dysuria on 02/15/2020. She reports dysuria reported at that time has resolved with treatment. Current symptoms reportedly began about 1 week ago.   She reports with urination she experiences "pressure" and pain, denies burning. She denies frequency. She does endorse urgency and incontinence but this is consistent with baseline. She wears depends. She denies hematuria. Patient denies abnormal urine odor, daughter disagrees, states has been foul. Reports patient cries due to the pain each time she tries to pass urine. Patient denies vaginal discharge, abdominal pain, back pain, rectal pain, fever/chills, confusion, fatigue/malaise.   Reports has been giving azo which has significantly improved sx, but stopped this 2-3 days ago.   Exam notable for complete bladder prolapse today; patient reports has seen GYN remotely and was fitted for pessary by Dr. Philis Pique, but had bleeding episode following and hasn't used since for "several decades." Reports frequently prolapsed in recent months, had been using astroglyde to reduce but admits hadn't been as consistent with this in recent weeks.    Past Medical History:  Diagnosis Date  . COPD (chronic obstructive pulmonary disease) ([place])   . GERD (gastroesophageal reflux disease)   . Hyperlipidemia   . Hypertension   . Morbid obesity ([place]) 08/01/2014  . Osteoporosis   . Prediabetes   . Vitamin D deficiency      Allergies  Allergen Reactions  . Aspirin Swelling  . Celebrex [Celecoxib] Other (See Comments)    "my body lit up"  . Fosamax [Alendronate Sodium] Other (See Comments)  unknown  . Keflex [Cephalexin] Other (See Comments)    unknown  . Penicillins Other (See Comments)    unknown  . Prednisone Itching  . Prevacid [Lansoprazole] Other (See Comments)    unknown  . Sulfa Antibiotics  Other (See Comments)    unknown  . Tetanus Toxoids Other (See Comments)    unknown  . Tetracyclines & Related Nausea Only    Nausea     Current Outpatient Medications on File Prior to Visit  Medication Sig  . acetaminophen (TYLENOL) 325 MG tablet Take 2 tablets (650 mg total) by mouth every 6 (six) hours as needed (prn TEMP>38.1 Celsius).  . busPIRone (BUSPAR) 10 MG tablet TAKE 1/2 - 1 TABLET 3 TIMES A DAY AS NEEDED FOR ANXIETY  . calcium-vitamin D (OSCAL WITH D) 250-125 MG-UNIT per tablet Take 1 tablet by mouth daily.  . Cholecalciferol (VITAMIN D3) 2000 UNITS TABS Take 4 tablets by mouth daily.   . ciprofloxacin (CIPRO) 250 MG tablet Take 1 tablet 2 x /day with Food for Infection  . citalopram (CELEXA) 20 MG tablet Take 1/2 to one tablet daily  . fexofenadine (ALLEGRA) 180 MG tablet Take 180 mg by mouth daily.   . fluconazole (DIFLUCAN) 150 MG tablet Take 1 tablet Now & repeat in 1 week  for yeast infection  . hydrochlorothiazide (HYDRODIURIL) 25 MG tablet Take 1 tablet Daily for BP & Fluid  . levothyroxine (SYNTHROID) 50 MCG tablet Take 1 tablet daily on an empty stomach with only water for 30 minutes & no Antacid meds, Calcium or Magnesium for 4 hours & avoid Biotin  . magnesium oxide (MAG-OX) 400 (241.3 MG) MG tablet Take 1 tablet (400 mg total) by mouth 2 (two) times daily. (Patient taking differently: Take 400 mg by mouth 2 (two) times daily. Takes one tablet daily)  . Multiple Vitamin (MULITIVITAMIN WITH MINERALS) TABS Take 1 tablet by mouth daily.  [name] Kitchen omeprazole (PRILOSEC) 40 MG capsule TAKE 1 CAPSULE BY MOUTH EVERY DAY  . Probiotic Product (PROBIOTIC FORMULA PO) Take 1 tablet by mouth daily.   [name] Kitchen terazosin (HYTRIN) 10 MG capsule Take 1 tablet at Bedtime  for BP   No current facility-administered medications on file prior to visit.    ROS: all negative except above.   Physical Exam:  BP 108/68   Pulse 90   Temp (!) 97.5 F (36.4 C)   SpO2 92%   General Appearance: Frail  elder in wheelchair, in no apparent distress. Eyes: PERRL, conjunctiva no swelling or erythema ENT/Mouth: No erythema, swelling, or exudate on post pharynx.  Tonsils not swollen or erythematous. Hearing normal.  Neck: Supple, thyroid normal.  Respiratory: Respiratory effort normal, BS equal bilaterally without rales, rhonchi, wheezing or stridor.  Cardio: RRR with no MRGs. Brisk peripheral pulses without edema.  Abdomen: Soft, + BS.  Non tender, no guarding, rebound, hernias, masses. Lymphatics: Non tender without lymphadenopathy.  Musculoskeletal: kyphosis, stands only with assistance Skin: Warm, dry; she has dusky erythema with superficial breakdown of skin to buttocks/sacrum,  GU: Limited exam as patient cannot get on exam table; patient assisted to standing position, she has complete bladder prolapse, cannot reduce for thorough vaginal exam but no labial erythema, rash, discharge Psych: Awake and oriented X 3, normal affect, Insight and Judgment is fair.     Izora Ribas, NP 5:22 PM [facility]

## 2020-03-06 NOTE — Patient Instructions (Signed)
Obtain fresh container/specimen tonight, then ok to start azo/phenazopyridium 1-2 tabs up to three times a day as needed  Try to keep bladder pushed up as much as possible, clean urethra with sterile wippes frequently and urinate after touching area if possible to pass bacteria in urethra  Consider adding vitamin C 1000 mg twice daily to acidify urine and reduce UTIs IF SHE CAN TOLERATE  If recurrent/persistent pain with urination, would recommend follow up GYN and discuss pessary or other intervention to help with bladder prolapse      Pelvic Organ Prolapse Pelvic organ prolapse is the stretching, bulging, or dropping of pelvic organs into an abnormal position. It happens when the muscles and tissues that surround and support pelvic structures become weak or stretched. Pelvic organ prolapse can involve the:  Vagina (vaginal prolapse).  Uterus (uterine prolapse).  Bladder (cystocele).  Rectum (rectocele).  Intestines (enterocele). When organs other than the vagina are involved, they often bulge into the vagina or protrude from the vagina, depending on how severe the prolapse is. What are the causes? This condition may be caused by:  Pregnancy, labor, and childbirth.  Past pelvic surgery.  Decreased production of the hormone estrogen associated with menopause.  Consistently lifting more than 50 lb (23 kg).  Obesity.  Long-term inability to pass stool (chronic constipation).  A cough that lasts a long time (chronic).  Buildup of fluid in the abdomen due to certain diseases and other conditions. What are the signs or symptoms? Symptoms of this condition include:  Passing a little urine (loss of bladder control) when you cough, sneeze, strain, and exercise (stress incontinence). This may be worse immediately after childbirth. It may gradually improve over time.  Feeling pressure in your pelvis or vagina. This pressure may increase when you cough or when you are passing  stool.  A bulge that protrudes from the opening of your vagina.  Difficulty passing urine or stool.  Pain in your lower back.  Pain, discomfort, or disinterest in sex.  Repeated bladder infections (urinary tract infections).  Difficulty inserting a tampon. In some people, this condition causes no symptoms. How is this diagnosed? This condition may be diagnosed based on a vaginal and rectal exam. During the exam, you may be asked to cough and strain while you are lying down, sitting, and standing up. Your health care provider will determine if other tests are required, such as bladder function tests. How is this treated? Treatment for this condition may depend on your symptoms. Treatment may include:  Lifestyle changes, such as changes to your diet.  Emptying your bladder at scheduled times (bladder training therapy). This can help reduce or avoid urinary incontinence.  Estrogen. Estrogen may help mild prolapse by increasing the strength and tone of pelvic floor muscles.  Kegel exercises. These may help mild cases of prolapse by strengthening and tightening the muscles of the pelvic floor.  A soft, flexible device that helps support the vaginal walls and keep pelvic organs in place (pessary). This is inserted into your vagina by your health care provider.  Surgery. This is often the only form of treatment for severe prolapse. Follow these instructions at home:  Avoid drinking beverages that contain caffeine or alcohol.  Increase your intake of high-fiber foods. This can help decrease constipation and straining during bowel movements.  Lose weight if recommended by your health care provider.  Wear a sanitary pad or adult diapers if you have urinary incontinence.  Avoid heavy lifting and straining with exercise  and work. Do not hold your breath when you perform mild to moderate lifting and exercise activities. Limit your activities as directed by your health care provider.  Do  Kegel exercises as directed by your health care provider. To do this: ? Squeeze your pelvic floor muscles tight. You should feel a tight lift in your rectal area and a tightness in your vaginal area. Keep your stomach, buttocks, and legs relaxed. ? Hold the muscles tight for up to 10 seconds. ? Relax your muscles. ? Repeat this exercise 50 times a day, or as many times as told by your health care provider. Continue to do this exercise for at least 4-6 weeks, or for as long as told by your health care provider.  Take over-the-counter and prescription medicines only as told by your health care provider.  If you have a pessary, take care of it as told by your health care provider.  Keep all follow-up visits as told by your health care provider. This is important. Contact a health care provider if you:  Have symptoms that interfere with your daily activities or sex life.  Need medicine to help with the discomfort.  Notice bleeding from your vagina that is not related to your period.  Have a fever.  Have pain or bleeding when you urinate.  Have bleeding when you pass stool.  Pass urine when you have sex.  Have chronic constipation.  Have a pessary that falls out.  Have bad smelling vaginal discharge.  Have an unusual, low pain in your abdomen. Summary  Pelvic organ prolapse is the stretching, bulging, or dropping of pelvic organs into an abnormal position. It happens when the muscles and tissues that surround and support pelvic structures become weak or stretched.  When organs other than the vagina are involved, they often bulge into the vagina or protrude from the vagina, depending on how severe the prolapse is.  In most cases, this condition needs to be treated only if it produces symptoms. Treatment may include lifestyle changes, estrogen, Kegel exercises, pessary insertion, or surgery.  Avoid heavy lifting and straining with exercise and work. Do not hold your breath when you  perform mild to moderate lifting and exercise activities. Limit your activities as directed by your health care provider. This information is not intended to replace advice given to you by your health care provider. Make sure you discuss any questions you have with your health care provider. Document Revised: 12/15/2017 Document Reviewed: 12/15/2017 Elsevier Patient Education  [place].     Phenazopyridine tablets What is this medicine? PHENAZOPYRIDINE (fen az oh PEER i deen) is a pain reliever. It is used to stop the pain, burning, or discomfort caused by infection or irritation of the urinary tract. This medicine is not an antibiotic. It will not cure a urinary tract infection. This medicine may be used for other purposes; ask your health care provider or pharmacist if you have questions. COMMON BRAND NAME(S): AZO, Azo-100, Azo-Gesic, Azo-Septic, Azo-Standard, Phenazo, Prodium, Pyridium, Urinary Analgesic, Uristat, Uristat Ultra What should I tell my health care provider before I take this medicine? They need to know if you have any of these conditions:  glucose-6-phosphate dehydrogenase (G6PD) deficiency  kidney disease  an unusual or allergic reaction to phenazopyridine, other medicines, foods, dyes, or preservatives  pregnant or trying to get pregnant  breast-feeding How should I use this medicine? Take this medicine by mouth with a glass of water. Follow the directions on the prescription label. Take  after meals. Take your doses at regular intervals. Do not take your medicine more often than directed. Do not skip doses or stop your medicine early even if you feel better. Do not stop taking except on your doctor's advice. Talk to your pediatrician regarding the use of this medicine in children. Special care may be needed. Overdosage: If you think you have taken too much of this medicine contact a poison control center or emergency room at once. NOTE: This medicine is only  for you. Do not share this medicine with others. What if I miss a dose? If you miss a dose, take it as soon as you can. If it is almost time for your next dose, take only that dose. Do not take double or extra doses. What may interact with this medicine? Interactions are not expected. This list may not describe all possible interactions. Give your health care provider a list of all the medicines, herbs, non-prescription drugs, or dietary supplements you use. Also tell them if you smoke, drink alcohol, or use illegal drugs. Some items may interact with your medicine. What should I watch for while using this medicine? Tell your doctor or health care professional if your symptoms do not improve or if they get worse. This medicine colors body fluids red. This effect is harmless and will go away after you are done taking the medicine. It will change urine to an dark orange or red color. The red color may stain clothing. Soft contact lenses may become permanently stained. It is best not to wear soft contact lenses while taking this medicine. If you are diabetic you may get a false positive result for sugar in your urine. Talk to your health care provider. What side effects may I notice from receiving this medicine? Side effects that you should report to your doctor or health care professional as soon as possible:  allergic reactions like skin rash, itching or hives, swelling of the face, lips, or tongue  blue or purple color of the skin  difficulty breathing  fever  less urine  unusual bleeding, bruising  unusual tired, weak  vomiting  yellowing of the eyes or skin Side effects that usually do not require medical attention (report to your doctor or health care professional if they continue or are bothersome):  dark urine  headache  stomach upset This list may not describe all possible side effects. Call your doctor for medical advice about side effects. You may report side effects to  FDA at 1-800-FDA-1088. Where should I keep my medicine? Keep out of the reach of children. Store at room temperature between 15 and 30 degrees C (59 and 86 degrees F). Protect from light and moisture. Throw away any unused medicine after the expiration date. NOTE: This sheet is a summary. It may not cover all possible information. If you have questions about this medicine, talk to your doctor, pharmacist, or health care provider.  2020 Elsevier/Gold Standard (2008-06-21 11:04:07)

## 2020-03-06 NOTE — Telephone Encounter (Signed)
[name], daughter called and asked why we were suggesting the patient come for an OV today. Per Dr Melford Aase, the patient continues to have pain when urinates and needs a repeat UA and culture. Also, the hematoma on the patient's lower leg needs to be rechecked. Patient has an appointment today with [name], NP.

## 2020-03-07 DIAGNOSIS — Z87440 Personal history of urinary (tract) infections: Secondary | ICD-10-CM | POA: Diagnosis not present

## 2020-03-08 DIAGNOSIS — R627 Adult failure to thrive: Secondary | ICD-10-CM | POA: Diagnosis not present

## 2020-03-08 DIAGNOSIS — M80032D Age-related osteoporosis with current pathological fracture, left forearm, subsequent encounter for fracture with routine healing: Secondary | ICD-10-CM | POA: Diagnosis not present

## 2020-03-08 DIAGNOSIS — M40209 Unspecified kyphosis, site unspecified: Secondary | ICD-10-CM | POA: Diagnosis not present

## 2020-03-08 DIAGNOSIS — E559 Vitamin D deficiency, unspecified: Secondary | ICD-10-CM | POA: Diagnosis not present

## 2020-03-08 DIAGNOSIS — E039 Hypothyroidism, unspecified: Secondary | ICD-10-CM | POA: Diagnosis not present

## 2020-03-08 DIAGNOSIS — W19XXXD Unspecified fall, subsequent encounter: Secondary | ICD-10-CM | POA: Diagnosis not present

## 2020-03-08 DIAGNOSIS — J449 Chronic obstructive pulmonary disease, unspecified: Secondary | ICD-10-CM | POA: Diagnosis not present

## 2020-03-08 DIAGNOSIS — I10 Essential (primary) hypertension: Secondary | ICD-10-CM | POA: Diagnosis not present

## 2020-03-08 DIAGNOSIS — Z9181 History of falling: Secondary | ICD-10-CM | POA: Diagnosis not present

## 2020-03-09 LAB — URINALYSIS, ROUTINE W REFLEX MICROSCOPIC
Bilirubin Urine: NEGATIVE
Glucose, UA: NEGATIVE
Hgb urine dipstick: NEGATIVE
Ketones, ur: NEGATIVE
Nitrite: NEGATIVE
Protein, ur: NEGATIVE
Specific Gravity, Urine: 1.02 (ref 1.001–1.03)
pH: 6.5 (ref 5.0–8.0)

## 2020-03-09 LAB — URINE CULTURE
MICRO NUMBER:: 10319379
SPECIMEN QUALITY:: ADEQUATE

## 2020-03-11 ENCOUNTER — Ambulatory Visit: Payer: Medicare Other

## 2020-03-11 ENCOUNTER — Other Ambulatory Visit: Payer: Self-pay | Admitting: Adult Health

## 2020-03-11 MED ORDER — NITROFURANTOIN MONOHYD MACRO 100 MG PO CAPS: 100.0000 mg | ORAL_CAPSULE | Freq: Two times a day (BID) | ORAL | 0 refills | Status: DC

## 2020-03-12 ENCOUNTER — Ambulatory Visit: Payer: Medicare Other

## 2020-03-12 DIAGNOSIS — J449 Chronic obstructive pulmonary disease, unspecified: Secondary | ICD-10-CM | POA: Diagnosis not present

## 2020-03-12 DIAGNOSIS — E039 Hypothyroidism, unspecified: Secondary | ICD-10-CM | POA: Diagnosis not present

## 2020-03-12 DIAGNOSIS — M80032D Age-related osteoporosis with current pathological fracture, left forearm, subsequent encounter for fracture with routine healing: Secondary | ICD-10-CM | POA: Diagnosis not present

## 2020-03-12 DIAGNOSIS — M40209 Unspecified kyphosis, site unspecified: Secondary | ICD-10-CM | POA: Diagnosis not present

## 2020-03-12 DIAGNOSIS — R627 Adult failure to thrive: Secondary | ICD-10-CM | POA: Diagnosis not present

## 2020-03-12 DIAGNOSIS — W19XXXD Unspecified fall, subsequent encounter: Secondary | ICD-10-CM | POA: Diagnosis not present

## 2020-03-12 DIAGNOSIS — E559 Vitamin D deficiency, unspecified: Secondary | ICD-10-CM | POA: Diagnosis not present

## 2020-03-12 DIAGNOSIS — Z9181 History of falling: Secondary | ICD-10-CM | POA: Diagnosis not present

## 2020-03-12 DIAGNOSIS — I10 Essential (primary) hypertension: Secondary | ICD-10-CM | POA: Diagnosis not present

## 2020-03-13 DIAGNOSIS — E039 Hypothyroidism, unspecified: Secondary | ICD-10-CM | POA: Diagnosis not present

## 2020-03-13 DIAGNOSIS — M80032D Age-related osteoporosis with current pathological fracture, left forearm, subsequent encounter for fracture with routine healing: Secondary | ICD-10-CM | POA: Diagnosis not present

## 2020-03-13 DIAGNOSIS — E559 Vitamin D deficiency, unspecified: Secondary | ICD-10-CM | POA: Diagnosis not present

## 2020-03-13 DIAGNOSIS — Z9181 History of falling: Secondary | ICD-10-CM | POA: Diagnosis not present

## 2020-03-13 DIAGNOSIS — M40209 Unspecified kyphosis, site unspecified: Secondary | ICD-10-CM | POA: Diagnosis not present

## 2020-03-13 DIAGNOSIS — R627 Adult failure to thrive: Secondary | ICD-10-CM | POA: Diagnosis not present

## 2020-03-13 DIAGNOSIS — I10 Essential (primary) hypertension: Secondary | ICD-10-CM | POA: Diagnosis not present

## 2020-03-13 DIAGNOSIS — J449 Chronic obstructive pulmonary disease, unspecified: Secondary | ICD-10-CM | POA: Diagnosis not present

## 2020-03-13 DIAGNOSIS — W19XXXD Unspecified fall, subsequent encounter: Secondary | ICD-10-CM | POA: Diagnosis not present

## 2020-03-15 DIAGNOSIS — Z9181 History of falling: Secondary | ICD-10-CM | POA: Diagnosis not present

## 2020-03-15 DIAGNOSIS — M40209 Unspecified kyphosis, site unspecified: Secondary | ICD-10-CM | POA: Diagnosis not present

## 2020-03-15 DIAGNOSIS — M80032D Age-related osteoporosis with current pathological fracture, left forearm, subsequent encounter for fracture with routine healing: Secondary | ICD-10-CM | POA: Diagnosis not present

## 2020-03-15 DIAGNOSIS — W19XXXD Unspecified fall, subsequent encounter: Secondary | ICD-10-CM | POA: Diagnosis not present

## 2020-03-15 DIAGNOSIS — R627 Adult failure to thrive: Secondary | ICD-10-CM | POA: Diagnosis not present

## 2020-03-15 DIAGNOSIS — J449 Chronic obstructive pulmonary disease, unspecified: Secondary | ICD-10-CM | POA: Diagnosis not present

## 2020-03-15 DIAGNOSIS — E559 Vitamin D deficiency, unspecified: Secondary | ICD-10-CM | POA: Diagnosis not present

## 2020-03-15 DIAGNOSIS — E039 Hypothyroidism, unspecified: Secondary | ICD-10-CM | POA: Diagnosis not present

## 2020-03-15 DIAGNOSIS — I10 Essential (primary) hypertension: Secondary | ICD-10-CM | POA: Diagnosis not present

## 2020-03-18 DIAGNOSIS — I10 Essential (primary) hypertension: Secondary | ICD-10-CM | POA: Diagnosis not present

## 2020-03-18 DIAGNOSIS — Z9181 History of falling: Secondary | ICD-10-CM | POA: Diagnosis not present

## 2020-03-18 DIAGNOSIS — E039 Hypothyroidism, unspecified: Secondary | ICD-10-CM | POA: Diagnosis not present

## 2020-03-18 DIAGNOSIS — W19XXXD Unspecified fall, subsequent encounter: Secondary | ICD-10-CM | POA: Diagnosis not present

## 2020-03-18 DIAGNOSIS — R627 Adult failure to thrive: Secondary | ICD-10-CM | POA: Diagnosis not present

## 2020-03-18 DIAGNOSIS — M40209 Unspecified kyphosis, site unspecified: Secondary | ICD-10-CM | POA: Diagnosis not present

## 2020-03-18 DIAGNOSIS — J449 Chronic obstructive pulmonary disease, unspecified: Secondary | ICD-10-CM | POA: Diagnosis not present

## 2020-03-18 DIAGNOSIS — E559 Vitamin D deficiency, unspecified: Secondary | ICD-10-CM | POA: Diagnosis not present

## 2020-03-18 DIAGNOSIS — M80032D Age-related osteoporosis with current pathological fracture, left forearm, subsequent encounter for fracture with routine healing: Secondary | ICD-10-CM | POA: Diagnosis not present

## 2020-03-19 DIAGNOSIS — E559 Vitamin D deficiency, unspecified: Secondary | ICD-10-CM | POA: Diagnosis not present

## 2020-03-19 DIAGNOSIS — J449 Chronic obstructive pulmonary disease, unspecified: Secondary | ICD-10-CM | POA: Diagnosis not present

## 2020-03-19 DIAGNOSIS — M40209 Unspecified kyphosis, site unspecified: Secondary | ICD-10-CM | POA: Diagnosis not present

## 2020-03-19 DIAGNOSIS — W19XXXD Unspecified fall, subsequent encounter: Secondary | ICD-10-CM | POA: Diagnosis not present

## 2020-03-19 DIAGNOSIS — Z9181 History of falling: Secondary | ICD-10-CM | POA: Diagnosis not present

## 2020-03-19 DIAGNOSIS — E039 Hypothyroidism, unspecified: Secondary | ICD-10-CM | POA: Diagnosis not present

## 2020-03-19 DIAGNOSIS — I10 Essential (primary) hypertension: Secondary | ICD-10-CM | POA: Diagnosis not present

## 2020-03-19 DIAGNOSIS — R627 Adult failure to thrive: Secondary | ICD-10-CM | POA: Diagnosis not present

## 2020-03-19 DIAGNOSIS — M80032D Age-related osteoporosis with current pathological fracture, left forearm, subsequent encounter for fracture with routine healing: Secondary | ICD-10-CM | POA: Diagnosis not present

## 2020-03-22 DIAGNOSIS — E039 Hypothyroidism, unspecified: Secondary | ICD-10-CM | POA: Diagnosis not present

## 2020-03-22 DIAGNOSIS — Z9181 History of falling: Secondary | ICD-10-CM | POA: Diagnosis not present

## 2020-03-22 DIAGNOSIS — W19XXXD Unspecified fall, subsequent encounter: Secondary | ICD-10-CM | POA: Diagnosis not present

## 2020-03-22 DIAGNOSIS — M40209 Unspecified kyphosis, site unspecified: Secondary | ICD-10-CM | POA: Diagnosis not present

## 2020-03-22 DIAGNOSIS — M80032D Age-related osteoporosis with current pathological fracture, left forearm, subsequent encounter for fracture with routine healing: Secondary | ICD-10-CM | POA: Diagnosis not present

## 2020-03-22 DIAGNOSIS — R627 Adult failure to thrive: Secondary | ICD-10-CM | POA: Diagnosis not present

## 2020-03-22 DIAGNOSIS — J449 Chronic obstructive pulmonary disease, unspecified: Secondary | ICD-10-CM | POA: Diagnosis not present

## 2020-03-22 DIAGNOSIS — I10 Essential (primary) hypertension: Secondary | ICD-10-CM | POA: Diagnosis not present

## 2020-03-22 DIAGNOSIS — E559 Vitamin D deficiency, unspecified: Secondary | ICD-10-CM | POA: Diagnosis not present

## 2020-03-23 ENCOUNTER — Other Ambulatory Visit: Payer: Self-pay | Admitting: Internal Medicine

## 2020-03-26 ENCOUNTER — Telehealth: Payer: Self-pay | Admitting: *Deleted

## 2020-03-26 ENCOUNTER — Ambulatory Visit (INDEPENDENT_AMBULATORY_CARE_PROVIDER_SITE_OTHER): Payer: Medicare Other | Admitting: *Deleted

## 2020-03-26 ENCOUNTER — Other Ambulatory Visit: Payer: Self-pay

## 2020-03-26 VITALS — BP 126/82 | HR 80 | Temp 97.0°F | Resp 16

## 2020-03-26 DIAGNOSIS — S52502A Unspecified fracture of the lower end of left radius, initial encounter for closed fracture: Secondary | ICD-10-CM | POA: Diagnosis not present

## 2020-03-26 DIAGNOSIS — N3001 Acute cystitis with hematuria: Secondary | ICD-10-CM

## 2020-03-26 DIAGNOSIS — Z87440 Personal history of urinary (tract) infections: Secondary | ICD-10-CM | POA: Diagnosis not present

## 2020-03-26 NOTE — Telephone Encounter (Signed)
Form and face to face OV note from 02/06/2020 faxed to [facility].

## 2020-03-26 NOTE — Progress Notes (Signed)
Patient is here for a NV to recheck urine. She states she has finished the Macrobid and is not having any symptoms. She states she is feeling much better. Patient will return her urine sample, due to urine spilling in transit.

## 2020-03-27 DIAGNOSIS — E039 Hypothyroidism, unspecified: Secondary | ICD-10-CM | POA: Diagnosis not present

## 2020-03-27 DIAGNOSIS — R627 Adult failure to thrive: Secondary | ICD-10-CM | POA: Diagnosis not present

## 2020-03-27 DIAGNOSIS — I10 Essential (primary) hypertension: Secondary | ICD-10-CM | POA: Diagnosis not present

## 2020-03-27 DIAGNOSIS — J449 Chronic obstructive pulmonary disease, unspecified: Secondary | ICD-10-CM | POA: Diagnosis not present

## 2020-03-27 DIAGNOSIS — M40209 Unspecified kyphosis, site unspecified: Secondary | ICD-10-CM | POA: Diagnosis not present

## 2020-03-27 DIAGNOSIS — Z9181 History of falling: Secondary | ICD-10-CM | POA: Diagnosis not present

## 2020-03-27 DIAGNOSIS — M80032D Age-related osteoporosis with current pathological fracture, left forearm, subsequent encounter for fracture with routine healing: Secondary | ICD-10-CM | POA: Diagnosis not present

## 2020-03-27 DIAGNOSIS — E559 Vitamin D deficiency, unspecified: Secondary | ICD-10-CM | POA: Diagnosis not present

## 2020-03-27 DIAGNOSIS — W19XXXD Unspecified fall, subsequent encounter: Secondary | ICD-10-CM | POA: Diagnosis not present

## 2020-03-28 DIAGNOSIS — M40209 Unspecified kyphosis, site unspecified: Secondary | ICD-10-CM | POA: Diagnosis not present

## 2020-03-28 DIAGNOSIS — Z9181 History of falling: Secondary | ICD-10-CM | POA: Diagnosis not present

## 2020-03-28 DIAGNOSIS — J449 Chronic obstructive pulmonary disease, unspecified: Secondary | ICD-10-CM | POA: Diagnosis not present

## 2020-03-28 DIAGNOSIS — Z87440 Personal history of urinary (tract) infections: Secondary | ICD-10-CM | POA: Diagnosis not present

## 2020-03-28 DIAGNOSIS — N3001 Acute cystitis with hematuria: Secondary | ICD-10-CM | POA: Diagnosis not present

## 2020-03-28 DIAGNOSIS — I10 Essential (primary) hypertension: Secondary | ICD-10-CM | POA: Diagnosis not present

## 2020-03-28 DIAGNOSIS — R627 Adult failure to thrive: Secondary | ICD-10-CM | POA: Diagnosis not present

## 2020-03-28 DIAGNOSIS — E559 Vitamin D deficiency, unspecified: Secondary | ICD-10-CM | POA: Diagnosis not present

## 2020-03-28 DIAGNOSIS — W19XXXD Unspecified fall, subsequent encounter: Secondary | ICD-10-CM | POA: Diagnosis not present

## 2020-03-28 DIAGNOSIS — M80032D Age-related osteoporosis with current pathological fracture, left forearm, subsequent encounter for fracture with routine healing: Secondary | ICD-10-CM | POA: Diagnosis not present

## 2020-03-28 DIAGNOSIS — E039 Hypothyroidism, unspecified: Secondary | ICD-10-CM | POA: Diagnosis not present

## 2020-03-29 DIAGNOSIS — W19XXXD Unspecified fall, subsequent encounter: Secondary | ICD-10-CM | POA: Diagnosis not present

## 2020-03-29 DIAGNOSIS — I10 Essential (primary) hypertension: Secondary | ICD-10-CM | POA: Diagnosis not present

## 2020-03-29 DIAGNOSIS — M40209 Unspecified kyphosis, site unspecified: Secondary | ICD-10-CM | POA: Diagnosis not present

## 2020-03-29 DIAGNOSIS — J449 Chronic obstructive pulmonary disease, unspecified: Secondary | ICD-10-CM | POA: Diagnosis not present

## 2020-03-29 DIAGNOSIS — M80032D Age-related osteoporosis with current pathological fracture, left forearm, subsequent encounter for fracture with routine healing: Secondary | ICD-10-CM | POA: Diagnosis not present

## 2020-03-29 DIAGNOSIS — R627 Adult failure to thrive: Secondary | ICD-10-CM | POA: Diagnosis not present

## 2020-03-29 DIAGNOSIS — Z9181 History of falling: Secondary | ICD-10-CM | POA: Diagnosis not present

## 2020-03-29 DIAGNOSIS — E039 Hypothyroidism, unspecified: Secondary | ICD-10-CM | POA: Diagnosis not present

## 2020-03-29 DIAGNOSIS — E559 Vitamin D deficiency, unspecified: Secondary | ICD-10-CM | POA: Diagnosis not present

## 2020-03-29 LAB — URINALYSIS, ROUTINE W REFLEX MICROSCOPIC
Bacteria, UA: NONE SEEN /HPF
Bilirubin Urine: NEGATIVE
Glucose, UA: NEGATIVE
Hgb urine dipstick: NEGATIVE
Hyaline Cast: NONE SEEN /LPF
Ketones, ur: NEGATIVE
Nitrite: NEGATIVE
Protein, ur: NEGATIVE
Specific Gravity, Urine: 1.016 (ref 1.001–1.03)
pH: 7.5 (ref 5.0–8.0)

## 2020-03-29 LAB — URINE CULTURE
MICRO NUMBER:: 10395685
SPECIMEN QUALITY:: ADEQUATE

## 2020-04-03 DIAGNOSIS — R627 Adult failure to thrive: Secondary | ICD-10-CM | POA: Diagnosis not present

## 2020-04-03 DIAGNOSIS — J449 Chronic obstructive pulmonary disease, unspecified: Secondary | ICD-10-CM | POA: Diagnosis not present

## 2020-04-03 DIAGNOSIS — W19XXXD Unspecified fall, subsequent encounter: Secondary | ICD-10-CM | POA: Diagnosis not present

## 2020-04-03 DIAGNOSIS — M40209 Unspecified kyphosis, site unspecified: Secondary | ICD-10-CM | POA: Diagnosis not present

## 2020-04-03 DIAGNOSIS — I10 Essential (primary) hypertension: Secondary | ICD-10-CM | POA: Diagnosis not present

## 2020-04-03 DIAGNOSIS — M80032D Age-related osteoporosis with current pathological fracture, left forearm, subsequent encounter for fracture with routine healing: Secondary | ICD-10-CM | POA: Diagnosis not present

## 2020-04-03 DIAGNOSIS — Z9181 History of falling: Secondary | ICD-10-CM | POA: Diagnosis not present

## 2020-04-03 DIAGNOSIS — E559 Vitamin D deficiency, unspecified: Secondary | ICD-10-CM | POA: Diagnosis not present

## 2020-04-03 DIAGNOSIS — E039 Hypothyroidism, unspecified: Secondary | ICD-10-CM | POA: Diagnosis not present

## 2020-04-04 DIAGNOSIS — R627 Adult failure to thrive: Secondary | ICD-10-CM | POA: Diagnosis not present

## 2020-04-04 DIAGNOSIS — Z9181 History of falling: Secondary | ICD-10-CM | POA: Diagnosis not present

## 2020-04-04 DIAGNOSIS — E559 Vitamin D deficiency, unspecified: Secondary | ICD-10-CM | POA: Diagnosis not present

## 2020-04-04 DIAGNOSIS — I10 Essential (primary) hypertension: Secondary | ICD-10-CM | POA: Diagnosis not present

## 2020-04-04 DIAGNOSIS — E039 Hypothyroidism, unspecified: Secondary | ICD-10-CM | POA: Diagnosis not present

## 2020-04-04 DIAGNOSIS — J449 Chronic obstructive pulmonary disease, unspecified: Secondary | ICD-10-CM | POA: Diagnosis not present

## 2020-04-04 DIAGNOSIS — M40209 Unspecified kyphosis, site unspecified: Secondary | ICD-10-CM | POA: Diagnosis not present

## 2020-04-04 DIAGNOSIS — M80032D Age-related osteoporosis with current pathological fracture, left forearm, subsequent encounter for fracture with routine healing: Secondary | ICD-10-CM | POA: Diagnosis not present

## 2020-04-04 DIAGNOSIS — W19XXXD Unspecified fall, subsequent encounter: Secondary | ICD-10-CM | POA: Diagnosis not present

## 2020-04-05 DIAGNOSIS — M40209 Unspecified kyphosis, site unspecified: Secondary | ICD-10-CM | POA: Diagnosis not present

## 2020-04-05 DIAGNOSIS — E559 Vitamin D deficiency, unspecified: Secondary | ICD-10-CM | POA: Diagnosis not present

## 2020-04-05 DIAGNOSIS — J449 Chronic obstructive pulmonary disease, unspecified: Secondary | ICD-10-CM | POA: Diagnosis not present

## 2020-04-05 DIAGNOSIS — W19XXXD Unspecified fall, subsequent encounter: Secondary | ICD-10-CM | POA: Diagnosis not present

## 2020-04-05 DIAGNOSIS — I10 Essential (primary) hypertension: Secondary | ICD-10-CM | POA: Diagnosis not present

## 2020-04-05 DIAGNOSIS — M80032D Age-related osteoporosis with current pathological fracture, left forearm, subsequent encounter for fracture with routine healing: Secondary | ICD-10-CM | POA: Diagnosis not present

## 2020-04-05 DIAGNOSIS — R627 Adult failure to thrive: Secondary | ICD-10-CM | POA: Diagnosis not present

## 2020-04-05 DIAGNOSIS — E039 Hypothyroidism, unspecified: Secondary | ICD-10-CM | POA: Diagnosis not present

## 2020-04-05 DIAGNOSIS — Z9181 History of falling: Secondary | ICD-10-CM | POA: Diagnosis not present

## 2020-04-09 DIAGNOSIS — M80032D Age-related osteoporosis with current pathological fracture, left forearm, subsequent encounter for fracture with routine healing: Secondary | ICD-10-CM | POA: Diagnosis not present

## 2020-04-09 DIAGNOSIS — J449 Chronic obstructive pulmonary disease, unspecified: Secondary | ICD-10-CM | POA: Diagnosis not present

## 2020-04-09 DIAGNOSIS — I10 Essential (primary) hypertension: Secondary | ICD-10-CM | POA: Diagnosis not present

## 2020-04-09 DIAGNOSIS — W19XXXD Unspecified fall, subsequent encounter: Secondary | ICD-10-CM | POA: Diagnosis not present

## 2020-04-09 DIAGNOSIS — E559 Vitamin D deficiency, unspecified: Secondary | ICD-10-CM | POA: Diagnosis not present

## 2020-04-09 DIAGNOSIS — E039 Hypothyroidism, unspecified: Secondary | ICD-10-CM | POA: Diagnosis not present

## 2020-04-09 DIAGNOSIS — R627 Adult failure to thrive: Secondary | ICD-10-CM | POA: Diagnosis not present

## 2020-04-09 DIAGNOSIS — M40209 Unspecified kyphosis, site unspecified: Secondary | ICD-10-CM | POA: Diagnosis not present

## 2020-04-09 DIAGNOSIS — Z9181 History of falling: Secondary | ICD-10-CM | POA: Diagnosis not present

## 2020-04-10 DIAGNOSIS — I10 Essential (primary) hypertension: Secondary | ICD-10-CM | POA: Diagnosis not present

## 2020-04-10 DIAGNOSIS — E559 Vitamin D deficiency, unspecified: Secondary | ICD-10-CM | POA: Diagnosis not present

## 2020-04-10 DIAGNOSIS — E039 Hypothyroidism, unspecified: Secondary | ICD-10-CM | POA: Diagnosis not present

## 2020-04-10 DIAGNOSIS — Z9181 History of falling: Secondary | ICD-10-CM | POA: Diagnosis not present

## 2020-04-10 DIAGNOSIS — R627 Adult failure to thrive: Secondary | ICD-10-CM | POA: Diagnosis not present

## 2020-04-10 DIAGNOSIS — M80032D Age-related osteoporosis with current pathological fracture, left forearm, subsequent encounter for fracture with routine healing: Secondary | ICD-10-CM | POA: Diagnosis not present

## 2020-04-10 DIAGNOSIS — W19XXXD Unspecified fall, subsequent encounter: Secondary | ICD-10-CM | POA: Diagnosis not present

## 2020-04-10 DIAGNOSIS — M40209 Unspecified kyphosis, site unspecified: Secondary | ICD-10-CM | POA: Diagnosis not present

## 2020-04-10 DIAGNOSIS — J449 Chronic obstructive pulmonary disease, unspecified: Secondary | ICD-10-CM | POA: Diagnosis not present

## 2020-04-11 ENCOUNTER — Other Ambulatory Visit: Payer: Self-pay | Admitting: Internal Medicine

## 2020-04-12 DIAGNOSIS — M40209 Unspecified kyphosis, site unspecified: Secondary | ICD-10-CM | POA: Diagnosis not present

## 2020-04-12 DIAGNOSIS — E559 Vitamin D deficiency, unspecified: Secondary | ICD-10-CM | POA: Diagnosis not present

## 2020-04-12 DIAGNOSIS — I10 Essential (primary) hypertension: Secondary | ICD-10-CM | POA: Diagnosis not present

## 2020-04-12 DIAGNOSIS — W19XXXD Unspecified fall, subsequent encounter: Secondary | ICD-10-CM | POA: Diagnosis not present

## 2020-04-12 DIAGNOSIS — E039 Hypothyroidism, unspecified: Secondary | ICD-10-CM | POA: Diagnosis not present

## 2020-04-12 DIAGNOSIS — J449 Chronic obstructive pulmonary disease, unspecified: Secondary | ICD-10-CM | POA: Diagnosis not present

## 2020-04-12 DIAGNOSIS — Z9181 History of falling: Secondary | ICD-10-CM | POA: Diagnosis not present

## 2020-04-12 DIAGNOSIS — R627 Adult failure to thrive: Secondary | ICD-10-CM | POA: Diagnosis not present

## 2020-04-12 DIAGNOSIS — M80032D Age-related osteoporosis with current pathological fracture, left forearm, subsequent encounter for fracture with routine healing: Secondary | ICD-10-CM | POA: Diagnosis not present

## 2020-04-18 DIAGNOSIS — Z9181 History of falling: Secondary | ICD-10-CM | POA: Diagnosis not present

## 2020-04-18 DIAGNOSIS — E559 Vitamin D deficiency, unspecified: Secondary | ICD-10-CM | POA: Diagnosis not present

## 2020-04-18 DIAGNOSIS — I10 Essential (primary) hypertension: Secondary | ICD-10-CM | POA: Diagnosis not present

## 2020-04-18 DIAGNOSIS — M40209 Unspecified kyphosis, site unspecified: Secondary | ICD-10-CM | POA: Diagnosis not present

## 2020-04-18 DIAGNOSIS — R627 Adult failure to thrive: Secondary | ICD-10-CM | POA: Diagnosis not present

## 2020-04-18 DIAGNOSIS — W19XXXD Unspecified fall, subsequent encounter: Secondary | ICD-10-CM | POA: Diagnosis not present

## 2020-04-18 DIAGNOSIS — M80032D Age-related osteoporosis with current pathological fracture, left forearm, subsequent encounter for fracture with routine healing: Secondary | ICD-10-CM | POA: Diagnosis not present

## 2020-04-18 DIAGNOSIS — E039 Hypothyroidism, unspecified: Secondary | ICD-10-CM | POA: Diagnosis not present

## 2020-04-18 DIAGNOSIS — J449 Chronic obstructive pulmonary disease, unspecified: Secondary | ICD-10-CM | POA: Diagnosis not present

## 2020-04-21 ENCOUNTER — Other Ambulatory Visit: Payer: Self-pay | Admitting: Adult Health

## 2020-04-24 DIAGNOSIS — Z9181 History of falling: Secondary | ICD-10-CM | POA: Diagnosis not present

## 2020-04-24 DIAGNOSIS — E039 Hypothyroidism, unspecified: Secondary | ICD-10-CM | POA: Diagnosis not present

## 2020-04-24 DIAGNOSIS — M80032D Age-related osteoporosis with current pathological fracture, left forearm, subsequent encounter for fracture with routine healing: Secondary | ICD-10-CM | POA: Diagnosis not present

## 2020-04-24 DIAGNOSIS — M40209 Unspecified kyphosis, site unspecified: Secondary | ICD-10-CM | POA: Diagnosis not present

## 2020-04-24 DIAGNOSIS — W19XXXD Unspecified fall, subsequent encounter: Secondary | ICD-10-CM | POA: Diagnosis not present

## 2020-04-24 DIAGNOSIS — R627 Adult failure to thrive: Secondary | ICD-10-CM | POA: Diagnosis not present

## 2020-04-24 DIAGNOSIS — J449 Chronic obstructive pulmonary disease, unspecified: Secondary | ICD-10-CM | POA: Diagnosis not present

## 2020-04-24 DIAGNOSIS — E559 Vitamin D deficiency, unspecified: Secondary | ICD-10-CM | POA: Diagnosis not present

## 2020-04-24 DIAGNOSIS — I10 Essential (primary) hypertension: Secondary | ICD-10-CM | POA: Diagnosis not present

## 2020-05-08 ENCOUNTER — Ambulatory Visit: Payer: Medicare Other | Admitting: Podiatry

## 2020-05-09 ENCOUNTER — Ambulatory Visit: Payer: Medicare Other | Admitting: Adult Health

## 2020-05-17 ENCOUNTER — Other Ambulatory Visit: Payer: Self-pay | Admitting: Internal Medicine

## 2020-05-17 ENCOUNTER — Other Ambulatory Visit: Payer: Self-pay | Admitting: Physician Assistant

## 2020-07-10 ENCOUNTER — Ambulatory Visit: Payer: Medicare Other | Admitting: Podiatry

## 2020-07-10 ENCOUNTER — Other Ambulatory Visit: Payer: Self-pay

## 2020-07-10 ENCOUNTER — Encounter: Payer: Self-pay | Admitting: Podiatry

## 2020-07-10 DIAGNOSIS — M79674 Pain in right toe(s): Secondary | ICD-10-CM

## 2020-07-10 DIAGNOSIS — M79675 Pain in left toe(s): Secondary | ICD-10-CM | POA: Diagnosis not present

## 2020-07-10 DIAGNOSIS — B351 Tinea unguium: Secondary | ICD-10-CM | POA: Diagnosis not present

## 2020-07-12 NOTE — Progress Notes (Signed)
Subjective:  Patient ID: Alyssa Schmitt, female    DOB: Mar 17, 1928,  MRN: [id number]  Alyssa Schmitt presents to clinic today for painful thick toenails that are difficult to trim. Pain interferes with ambulation. Aggravating factors include wearing enclosed shoe gear. Pain is relieved with periodic professional debridement..  Her daughter is present during today's visit. Daughter states she herself has been out having open heart surgery. She is still recovering.   Review of Systems: Negative except as noted in the HPI. Past Medical History:  Diagnosis Date  . COPD (chronic obstructive pulmonary disease) ([place])   . GERD (gastroesophageal reflux disease)   . Hyperlipidemia   . Hypertension   . Morbid obesity ([place]) 08/01/2014  . Osteoporosis   . Prediabetes   . Vitamin D deficiency    Past Surgical History:  Procedure Laterality Date  . TONSILLECTOMY     childhood  . VESICOVAGINAL FISTULA CLOSURE W/ TAH      Current Outpatient Medications:  .  acetaminophen (TYLENOL) 325 MG tablet, Take 2 tablets (650 mg total) by mouth every 6 (six) hours as needed (prn TEMP>38.1 Celsius)., Disp: 30 tablet, Rfl: 2 .  busPIRone (BUSPAR) 10 MG tablet, Take 1 tablet 3 x  /day for Chronic Anxiety, Disp: 270 tablet, Rfl: 0 .  calcium-vitamin D (OSCAL WITH D) 250-125 MG-UNIT per tablet, Take 1 tablet by mouth daily., Disp: , Rfl:  .  Cholecalciferol (VITAMIN D3) 2000 UNITS TABS, Take 4 tablets by mouth daily. , Disp: , Rfl:  .  citalopram (CELEXA) 20 MG tablet, Take 1/2 to one tablet daily, Disp: 90 tablet, Rfl: 1 .  fexofenadine (ALLEGRA) 180 MG tablet, Take 180 mg by mouth daily. , Disp: , Rfl:  .  hydrochlorothiazide (HYDRODIURIL) 25 MG tablet, TAKE 1 TABLET DAILY FOR BP & FLUID, Disp: 90 tablet, Rfl: 3 .  levothyroxine (SYNTHROID) 50 MCG tablet, TAKE 1 TABLET DAILY ON AN EMPTY STOMACH WITH ONLY WATER FOR 30 MINUTES & NO ANTACID MEDS, CALCIUM OR MAGNESIUM FOR 4 HOURS & AVOID BIOTIN, Disp: 90 tablet,  Rfl: 3 .  magnesium oxide (MAG-OX) 400 (241.3 MG) MG tablet, Take 1 tablet (400 mg total) by mouth 2 (two) times daily. (Patient taking differently: Take 400 mg by mouth 2 (two) times daily. Takes one tablet daily), Disp: 60 tablet, Rfl: 2 .  Multiple Vitamin (MULITIVITAMIN WITH MINERALS) TABS, Take 1 tablet by mouth daily., Disp: , Rfl:  .  nitrofurantoin, macrocrystal-monohydrate, (MACROBID) 100 MG capsule, Take 1 capsule (100 mg total) by mouth 2 (two) times daily., Disp: 14 capsule, Rfl: 0 .  omeprazole (PRILOSEC) 40 MG capsule, TAKE 1 CAPSULE BY MOUTH EVERY DAY, Disp: 90 capsule, Rfl: 4 .  phenazopyridine (PYRIDIUM) 100 MG tablet, 1-2 tabs up to 3 times a day as needed for bladder/pelvic pain., Disp: 90 tablet, Rfl: 0 .  Probiotic Product (PROBIOTIC FORMULA PO), Take 1 tablet by mouth daily. , Disp: , Rfl:  .  terazosin (HYTRIN) 10 MG capsule, Take 1 tablet at Bedtime  for BP, Disp: 90 capsule, Rfl: 3 Allergies  Allergen Reactions  . Aspirin Swelling  . Celebrex [Celecoxib] Other (See Comments)    "my body lit up"  . Fosamax [Alendronate Sodium] Other (See Comments)    unknown  . Keflex [Cephalexin] Other (See Comments)    unknown  . Penicillins Other (See Comments)    unknown  . Prednisone Itching  . Prevacid [Lansoprazole] Other (See Comments)    unknown  . Sulfa Antibiotics Other (See  Comments)    unknown  . Tetanus Toxoids Other (See Comments)    unknown  . Tetracyclines & Related Nausea Only    Nausea    Social History   Occupational History  . Not on file  Tobacco Use  . Smoking status: Never Smoker  . Smokeless tobacco: Never Used  Substance and Sexual Activity  . Alcohol use: No  . Drug use: No  . Sexual activity: Not on file    Objective:   Constitutional Alyssa Schmitt is a pleasant 86 y.o. Caucasian female, WD, WN in NAD.[name] Kitchen AAO x 3.   Vascular Neurovascular status unchanged b/l lower extremities. Capillary refill time to digits immediate b/l. Palpable  pedal pulses b/l LE. Pedal hair sparse. Lower extremity skin temperature gradient within normal limits. Varicosities present b/l. Evidence of chronic venous insufficiency b/l LE.  No cyanosis or clubbing noted.  Neurologic Normal speech. Oriented to person, place, and time. Protective sensation intact 5/5 intact bilaterally with 10g monofilament b/l. Vibratory sensation intact b/l.  Dermatologic Pedal skin with normal turgor, texture and tone bilaterally. No open wounds bilaterally. No interdigital macerations bilaterally. Toenails 1-5 b/l elongated, discolored, dystrophic, thickened, crumbly with subungual debris and tenderness to dorsal palpation.  Orthopedic: Normal muscle strength 5/5 to all lower extremity muscle groups bilaterally. No pain crepitus or joint limitation noted with ROM b/l. Pes planus deformity noted b/l.  Utilizes transport chair for ambulation assistance.   Radiographs: None Assessment:  No diagnosis found. Plan:  Patient was evaluated and treated and all questions answered.  Onychomycosis with pain -Nails palliatively debridement as below -Educated on self-care  Procedure: Nail Debridement Rationale: Pain Type of Debridement: manual, sharp debridement. Instrumentation: Nail nipper, rotary burr. Number of Nails: 10 -Examined patient. -Toenails 1-5 b/l were debrided in length and girth with sterile nail nippers and dremel without iatrogenic bleeding.  -Patient to report any pedal injuries to medical professional immediately. -Patient to continue soft, supportive shoe gear daily. -Patient/POA to call should there be question/concern in the interim.  Return in about 3 months (around 10/10/2020) for nail trim.  Marzetta Board, DPM

## 2020-07-29 ENCOUNTER — Ambulatory Visit: Payer: Self-pay

## 2020-07-29 ENCOUNTER — Ambulatory Visit: Payer: Medicare Other | Admitting: Orthopedic Surgery

## 2020-07-29 ENCOUNTER — Encounter: Payer: Self-pay | Admitting: Physician Assistant

## 2020-07-29 VITALS — Ht 61.0 in | Wt 183.0 lb

## 2020-07-29 DIAGNOSIS — M79604 Pain in right leg: Secondary | ICD-10-CM | POA: Diagnosis not present

## 2020-07-29 NOTE — Progress Notes (Signed)
Office Visit Note   Patient: Alyssa Schmitt           Date of Birth: 09/19/1928           MRN: [id number] Visit Date: 07/29/2020              Requested by: Unk Pinto, [place],  [place] [address] PCP: Unk Pinto, MD  Chief Complaint  Patient presents with  . Right Leg - Pain      HPI: Patient is an 86 year old woman who presents complaining of pain in the right groin she denies any foot pain.  She is currently nonambulatory in a wheelchair.  Assessment & Plan: Visit Diagnoses:  1. Pain in right leg     Plan: Patient may increase her activities as tolerated discussed that her symptoms are primarily from osteoarthritis of the right hip.  Follow-Up Instructions: Return if symptoms worsen or fail to improve.   Ortho Exam  Patient is alert, oriented, no adenopathy, well-dressed, normal affect, normal respiratory effort. Examination patient has no pain with range of motion of the foot or knee.  Patient does have pain with range of motion of the right hip which reproduces groin pain.  Negative straight leg raise.  Imaging: XR FEMUR, MIN 2 VIEWS RIGHT  Result Date: 07/29/2020 2 view radiographs of the right femur shows no evidence of a fracture at the hip or knee there is osteoarthritis with bone-on-bone contact right hip.  No images are attached to the encounter.  Labs: Lab Results  Component Value Date   HGBA1C 5.3 02/06/2020   HGBA1C 5.4 06/21/2019   HGBA1C 5.4 12/21/2018   REPTSTATUS 12/25/2014 FINAL 12/19/2014   CULT  12/19/2014    NO GROWTH 5 DAYS Performed at [facility] NO GROWTH 08/28/2014     Lab Results  Component Value Date   ALBUMIN 4.3 04/13/2017   ALBUMIN 4.6 10/06/2016   ALBUMIN 4.2 06/22/2016    Lab Results  Component Value Date   MG 1.9 02/06/2020   MG 1.8 09/25/2019   MG 1.7 06/21/2019   Lab Results  Component Value Date   VD25OH 115 (H) 02/06/2020   VD25OH 94  06/21/2019   VD25OH 79 12/21/2018    No results found for: PREALBUMIN CBC EXTENDED Latest Ref Rng & Units 02/06/2020 09/25/2019 06/21/2019  WBC 3.8 - 10.8 Thousand/uL 5.2 5.1 4.6  RBC 3.80 - 5.10 Million/uL 4.87 4.86 4.92  HGB 11.7 - 15.5 g/dL 14.9 14.9 15.0  HCT 35 - 45 % 44.2 44.6 44.4  PLT 140 - 400 Thousand/uL 170 172 169  NEUTROABS 1,500 - 7,800 cells/uL 3,182 2,555 2,875  LYMPHSABS 850 - 3,900 cells/uL 1,420 1,867 1,242     Body mass index is 34.58 kg/m.  Orders:  Orders Placed This Encounter  Procedures  . XR FEMUR, MIN 2 VIEWS RIGHT   No orders of the defined types were placed in this encounter.    Procedures: No procedures performed  Clinical Data: No additional findings.  ROS:  All other systems negative, except as noted in the HPI. Review of Systems  Objective: Vital Signs: Ht 5\' 1"  (1.549 m)   Wt 183 lb (83 kg)   BMI 34.58 kg/m   Specialty Comments:  No specialty comments available.  PMFS History: Patient Active Problem List   Diagnosis Date Noted  . Acquired female bladder prolapse 03/06/2020  . Pressure injury of buttock, stage 1 03/06/2020  . Elevated  LFTs 08/26/2018  . Hypertensive retinopathy of both eyes 09/21/2017  . Depression, major, in remission ([place]) 06/22/2016  . COPD 08/15/2015  . GERD  08/15/2015  . Osteoporosis 07/16/2015  . Hypothyroidism   . Medication management 03/20/2014  . Essential hypertension   . Hyperlipidemia, mixed   . Abnormal glucose   . Vitamin D deficiency   . Varicose veins of bilateral lower extremities with other complications 86/38/1771   Past Medical History:  Diagnosis Date  . COPD (chronic obstructive pulmonary disease) ([place])   . GERD (gastroesophageal reflux disease)   . Hyperlipidemia   . Hypertension   . Morbid obesity ([place]) 08/01/2014  . Osteoporosis   . Prediabetes   . Vitamin D deficiency     Family History  Problem Relation Age of Onset  . Heart disease Mother   . Other Mother         VARICOSE VEINS  . Diabetes Father   . Other Brother        VARICOSE VEINS    Past Surgical History:  Procedure Laterality Date  . TONSILLECTOMY     childhood  . VESICOVAGINAL FISTULA CLOSURE W/ TAH     Social History   Occupational History  . Not on file  Tobacco Use  . Smoking status: Never Smoker  . Smokeless tobacco: Never Used  Substance and Sexual Activity  . Alcohol use: No  . Drug use: No  . Sexual activity: Not on file

## 2020-08-14 ENCOUNTER — Other Ambulatory Visit: Payer: Self-pay | Admitting: Internal Medicine

## 2020-08-18 ENCOUNTER — Encounter: Payer: Self-pay | Admitting: Internal Medicine

## 2020-08-18 NOTE — Patient Instructions (Signed)

## 2020-08-18 NOTE — Progress Notes (Signed)
History of Present Illness:       This very nice 86 y.o.  WWF presents for 6  month follow up with HTN, HLD, Hypothyroidism,  Pre-Diabetes and Vitamin D Deficiency. Patient's GERD is controlled on her meds. Patient brought in by daughter who relates her mother is requiring 8 hours of assistance daily to assist with bathing, ADL's and transfers from bed to wheelchair for bathroom visits. Also, relates that she is incontinent of urine & sometimes stool.       Patient is treated for HTN  (1990) & BP has been controlled at home. Today's BP is at goal -  108/64. Patient has had no complaints of any cardiac type chest pain, palpitations, dyspnea / orthopnea / PND, dizziness, claudication, or dependent edema.      Hyperlipidemia is not controlled with diet and not treated aggressively in consideration of age. Last Lipids were not at goal:  Lab Results  Component Value Date   CHOL 192 02/06/2020   HDL 55 02/06/2020   LDLCALC 118 (H) 02/06/2020   TRIG 87 02/06/2020   CHOLHDL 3.5 02/06/2020    Also, the patient has history of PreDiabetes (A1c 5.7% /2014)  and has had no symptoms of reactive hypoglycemia, diabetic polys, paresthesias or visual blurring.  Last A1c was Normal & at goal:  Lab Results  Component Value Date   HGBA1C 5.3 02/06/2020                                           Patient was diagnosed Hypothyroid In 2011 and was initiated on Thyroid Replacement.       Further, the patient also has history of Vitamin D Deficiency and supplements vitamin D without any suspected side-effects. Last vitamin D was sl elevated & dose was tapered:  Lab Results  Component Value Date   VD25OH 115 (H) 02/06/2020    Current Outpatient Medications on File Prior to Visit  Medication Sig  . acetaminophen (TYLENOL) 325 MG tablet Take 2 tablets (650 mg total) by mouth every 6 (six) hours as needed (prn TEMP>38.1 Celsius).  . busPIRone (BUSPAR) 10 MG tablet TAKE 1 TABLET 3 TIMES A DAY FOR  CHRONIC ANXIETY  . calcium-vitamin D (OSCAL WITH D) 250-125 MG-UNIT per tablet Take 1 tablet by mouth daily.  . Cholecalciferol (VITAMIN D3) 2000 UNITS TABS Take 4 tablets by mouth daily.   . citalopram (CELEXA) 20 MG tablet Take 1/2 to one tablet daily  . fexofenadine (ALLEGRA) 180 MG tablet Take 180 mg by mouth daily.   . hydrochlorothiazide (HYDRODIURIL) 25 MG tablet TAKE 1 TABLET DAILY FOR BP & FLUID  . levothyroxine (SYNTHROID) 50 MCG tablet TAKE 1 TABLET DAILY ON AN EMPTY STOMACH WITH ONLY WATER FOR 30 MINUTES & NO ANTACID MEDS, CALCIUM OR MAGNESIUM FOR 4 HOURS & AVOID BIOTIN  . magnesium oxide (MAG-OX) 400 (241.3 MG) MG tablet Take 1 tablet (400 mg total) by mouth 2 (two) times daily. (Patient taking differently: Take 400 mg by mouth 2 (two) times daily. Takes one tablet daily)  . Multiple Vitamin (MULITIVITAMIN WITH MINERALS) TABS Take 1 tablet by mouth daily.  [name] Kitchen omeprazole (PRILOSEC) 40 MG capsule TAKE 1 CAPSULE BY MOUTH EVERY DAY  . Probiotic Product (PROBIOTIC FORMULA PO) Take 1 tablet by mouth daily.   [name] Kitchen terazosin (HYTRIN) 10 MG capsule Take 1 tablet at Bedtime  for BP   No current facility-administered medications on file prior to visit.    Allergies  Allergen Reactions  . Aspirin Swelling  . Celebrex [Celecoxib] Other (See Comments)    "my body lit up"  . Fosamax [Alendronate Sodium] Other (See Comments)    unknown  . Keflex [Cephalexin] Other (See Comments)    unknown  . Penicillins Other (See Comments)    unknown  . Prednisone Itching  . Prevacid [Lansoprazole] Other (See Comments)    unknown  . Sulfa Antibiotics Other (See Comments)    unknown  . Tetanus Toxoids Other (See Comments)    unknown  . Tetracyclines & Related Nausea Only    Nausea     PMHx:   Past Medical History:  Diagnosis Date  . COPD (chronic obstructive pulmonary disease) ([place])   . GERD (gastroesophageal reflux disease)   . Hyperlipidemia   . Hypertension   . Morbid obesity ([place])  08/01/2014  . Osteoporosis   . Prediabetes   . Vitamin D deficiency     Immunization History  Administered Date(s) Administered  . Influenza, High Dose Seasonal PF 09/05/2014, 09/17/2015, 08/25/2016, 09/22/2017, 08/25/2018, 09/25/2019  . PFIZER SARS-COV-2 Vaccination 01/19/2020  . Pneumococcal Conjugate-13 11/20/2015  . Pneumococcal Polysaccharide-23 12/18/2007  . Zoster 07/25/2013    Past Surgical History:  Procedure Laterality Date  . TONSILLECTOMY     childhood  . VESICOVAGINAL FISTULA CLOSURE W/ TAH      FHx:    Reviewed / unchanged  SHx:    Reviewed / unchanged   Systems Review:  Constitutional: Denies fever, chills, wt changes, headaches, insomnia, fatigue, night sweats, change in appetite. Eyes: Denies redness, blurred vision, diplopia, discharge, itchy, watery eyes.  ENT: Denies discharge, congestion, post nasal drip, epistaxis, sore throat, earache, hearing loss, dental pain, tinnitus, vertigo, sinus pain, snoring.  CV: Denies chest pain, palpitations, irregular heartbeat, syncope, dyspnea, diaphoresis, orthopnea, PND, claudication or edema. Respiratory: denies cough, dyspnea, DOE, pleurisy, hoarseness, laryngitis, wheezing.  Gastrointestinal: Denies dysphagia, odynophagia, heartburn, reflux, water brash, abdominal pain or cramps, nausea, vomiting, bloating, diarrhea, constipation, hematemesis, melena, hematochezia  or hemorrhoids. Genitourinary: Denies dysuria, frequency, urgency, nocturia, hesitancy, discharge, hematuria or flank pain. Musculoskeletal:  Has c/o multiple arthralgias of "all" joints, denies myalgias. Skin: Denies pruritus,  hives, warts, acne, eczema or change in skin lesion(s). Neuro: No weakness, tremor, incoordination, spasms, paresthesia or pain. Psychiatric: Denies confusion, memory loss or sensory loss. Endo: Denies change in weight, skin or hair change.  Heme/Lymph: No excessive bleeding, bruising or enlarged lymph nodes.  Physical Exam  BP  108/64   Pulse 84   Temp (!) 97.5 F (36.4 C)   Resp 16   Appears  over nourished , sedentary and "hunched-over" in a wheel chair and in no distress.  Eyes: PERRLA, EOMs, conjunctiva no swelling or erythema. Sinuses: No frontal/maxillary tenderness ENT/Mouth: EAC's clear, TM's nl w/o erythema, bulging. Nares clear w/o erythema, swelling, exudates. Oropharynx clear without erythema or exudates. Oral hygiene is good. Tongue normal, non obstructing. Hearing intact.  Neck: Supple. Thyroid not palpable. Car 2+/2+ without bruits, nodes or JVD. Chest:  Kyphotic. Respirations decreased with BS clear & equal w/o rales, rhonchi, wheezing or stridor.  Cor: Heart sounds soft, distant w/ regular rate and rhythm without sig. murmurs. Peripheral pulses normal and equal  With 1 (+) ankle edema.  Abdomen: Soft & bowel sounds normal. Non-tender w/o guarding, rebound, hernias, masses or organomegaly.  Lymphatics: Unremarkable.  Musculoskeletal:  Generalized decrease in muscle power, tone  and bulk. Gait is unstable with high fall risk and patient is totally wheelchair dependent requiring 4 (+) max assistance for transfers.   Skin: Warm, thin atrophic without exposed , lesions or ecchymosis apparent.  There is what appears to be a heat rash of the upper back / shoulder areas. Neuro: Cranial nerves intact, reflexes equal bilaterally. Sensory-motor testing grossly intact. Tendon reflexes grossly intact.  Pysch: Alert & oriented x 3.  Insight and judgement poor.   Assessment and Plan:  1. Essential hypertension  - Continue medication, monitor blood pressure at home.  - Continue DASH diet.  Reminder to go to the ER if any CP,  SOB, nausea, dizziness, severe HA, changes vision/speech.  - CBC with Differential/Platelet - COMPLETE METABOLIC PANEL WITH GFR - Magnesium - TSH  2. Hyperlipidemia, mixed  - Continue diet/meds, exercise,& lifestyle modifications.   - TSH  3. Abnormal glucose  - Continue diet,  exercise  - Lifestyle modifications.  - Monitor appropriate labs.   - Hemoglobin A1c - Insulin, random  4. Vitamin D deficiency  - Continue supplementation.  - VITAMIN D 25 Hydroxy   5. Hypothyroidism  - TSH  6. Gastroesophageal reflux disease  - CBC with Differential/Platelet  7. Medication management  - CBC with Differential/Platelet - COMPLETE METABOLIC PANEL WITH GFR - Magnesium - TSH - Hemoglobin A1c - Insulin, random - VITAMIN D 25 Hydroxy       Discussed  regular exercise, BP monitoring, weight control to achieve/maintain BMI less than 25 and discussed med and SE's. Recommended labs to assess and monitor clinical status with further disposition pending results of labs.  I discussed the assessment and treatment plan with the patient. The patient was provided an opportunity to ask questions and all were answered. The patient agreed with the plan and demonstrated an understanding of the instructions.  I provided over 30 minutes of exam, counseling, chart review and  complex critical decision making.   Kirtland Bouchard, MD

## 2020-08-19 ENCOUNTER — Ambulatory Visit (INDEPENDENT_AMBULATORY_CARE_PROVIDER_SITE_OTHER): Payer: Medicare Other | Admitting: Internal Medicine

## 2020-08-19 ENCOUNTER — Other Ambulatory Visit: Payer: Self-pay

## 2020-08-19 ENCOUNTER — Encounter: Payer: Self-pay | Admitting: Internal Medicine

## 2020-08-19 VITALS — BP 108/64 | HR 84 | Temp 97.5°F | Resp 16 | Ht 60.0 in | Wt 180.0 lb

## 2020-08-19 DIAGNOSIS — I10 Essential (primary) hypertension: Secondary | ICD-10-CM

## 2020-08-19 DIAGNOSIS — E782 Mixed hyperlipidemia: Secondary | ICD-10-CM

## 2020-08-19 DIAGNOSIS — K219 Gastro-esophageal reflux disease without esophagitis: Secondary | ICD-10-CM

## 2020-08-19 DIAGNOSIS — E038 Other specified hypothyroidism: Secondary | ICD-10-CM | POA: Diagnosis not present

## 2020-08-19 DIAGNOSIS — N3000 Acute cystitis without hematuria: Secondary | ICD-10-CM

## 2020-08-19 DIAGNOSIS — R7309 Other abnormal glucose: Secondary | ICD-10-CM

## 2020-08-19 DIAGNOSIS — E559 Vitamin D deficiency, unspecified: Secondary | ICD-10-CM

## 2020-08-19 DIAGNOSIS — Z79899 Other long term (current) drug therapy: Secondary | ICD-10-CM

## 2020-08-19 MED ORDER — DEXAMETHASONE 4 MG PO TABS: ORAL_TABLET | ORAL | 1 refills | Status: DC

## 2020-08-20 LAB — MAGNESIUM: Magnesium: 1.8 mg/dL (ref 1.5–2.5)

## 2020-08-20 LAB — COMPLETE METABOLIC PANEL WITH GFR
AG Ratio: 1.2 (calc) (ref 1.0–2.5)
ALT: 15 U/L (ref 6–29)
AST: 29 U/L (ref 10–35)
Albumin: 4 g/dL (ref 3.6–5.1)
Alkaline phosphatase (APISO): 49 U/L (ref 37–153)
BUN: 20 mg/dL (ref 7–25)
CO2: 32 mmol/L (ref 20–32)
Calcium: 9.7 mg/dL (ref 8.6–10.4)
Chloride: 98 mmol/L (ref 98–110)
Creat: 0.84 mg/dL (ref 0.60–0.88)
GFR, Est African American: 71 mL/min/{1.73_m2} (ref >=60)
GFR, Est Non African American: 62 mL/min/{1.73_m2} (ref >=60)
Globulin: 3.3 g/dL (calc) (ref 1.9–3.7)
Glucose, Bld: 119 mg/dL — ABNORMAL HIGH (ref 65–99)
Potassium: 3.4 mmol/L — ABNORMAL LOW (ref 3.5–5.3)
Sodium: 139 mmol/L (ref 135–146)
Total Bilirubin: 0.6 mg/dL (ref 0.2–1.2)
Total Protein: 7.3 g/dL (ref 6.1–8.1)

## 2020-08-20 LAB — CBC WITH DIFFERENTIAL/PLATELET
Absolute Monocytes: 523 cells/uL (ref 200–950)
Basophils Absolute: 61 cells/uL (ref 0–200)
Basophils Relative: 1.1 %
Eosinophils Absolute: 110 cells/uL (ref 15–500)
Eosinophils Relative: 2 %
HCT: 44.9 % (ref 35.0–45.0)
Hemoglobin: 15 g/dL (ref 11.7–15.5)
Lymphs Abs: 1953 cells/uL (ref 850–3900)
MCH: 30.3 pg (ref 27.0–33.0)
MCHC: 33.4 g/dL (ref 32.0–36.0)
MCV: 90.7 fL (ref 80.0–100.0)
MPV: 10.7 fL (ref 7.5–12.5)
Monocytes Relative: 9.5 %
Neutro Abs: 2855 cells/uL (ref 1500–7800)
Neutrophils Relative %: 51.9 %
Platelets: 172 10*3/uL (ref 140–400)
RBC: 4.95 10*6/uL (ref 3.80–5.10)
RDW: 12.6 % (ref 11.0–15.0)
Total Lymphocyte: 35.5 %
WBC: 5.5 10*3/uL (ref 3.8–10.8)

## 2020-08-20 LAB — HEMOGLOBIN A1C
Hgb A1c MFr Bld: 5.4 % of total Hgb (ref <5.7)
Mean Plasma Glucose: 108 (calc)
eAG (mmol/L): 6 (calc)

## 2020-08-20 LAB — TSH: TSH: 3.04 mIU/L (ref 0.40–4.50)

## 2020-08-20 LAB — INSULIN, RANDOM: Insulin: 22.5 u[IU]/mL — ABNORMAL HIGH

## 2020-08-20 LAB — VITAMIN D 25 HYDROXY (VIT D DEFICIENCY, FRACTURES): Vit D, 25-Hydroxy: 111 ng/mL — ABNORMAL HIGH (ref 30–100)

## 2020-08-20 NOTE — Progress Notes (Signed)
=========================================================  -   Potassium is slightly Low = Recommend use "No Salt" on food up to   1/2 teaspoonful /day =========================================================  - Thyroid - Normal & OK  =========================================================  - A1c - Normal - No Diabetes  - Great  ! =========================================================  - Vitamin D = 111 - borderline slightly elevated, So............................  - Recommend decrease vitamin D 2,000 unit capsules from   4 caps /day down to only 3 capsules /day  =========================================================  - All Else - CBC - Kidneys - Electrolytes - Liver & Magnesium   - all  Normal / OK ====================================================   - Keep up the [place] Work  ! =========================================================

## 2020-08-22 DIAGNOSIS — N3000 Acute cystitis without hematuria: Secondary | ICD-10-CM | POA: Diagnosis not present

## 2020-08-23 LAB — URINALYSIS, ROUTINE W REFLEX MICROSCOPIC
Bilirubin Urine: NEGATIVE
Glucose, UA: NEGATIVE
Hgb urine dipstick: NEGATIVE
Nitrite: NEGATIVE
Specific Gravity, Urine: 1.023 (ref 1.001–1.03)
pH: 7.5 (ref 5.0–8.0)

## 2020-08-23 LAB — URINE CULTURE
MICRO NUMBER:: 10960083
SPECIMEN QUALITY:: ADEQUATE

## 2020-08-24 NOTE — Progress Notes (Signed)
============================================================ ============================================================  -    Urine Culture returned - OK - No Infection   ============================================================ ============================================================

## 2020-08-25 ENCOUNTER — Other Ambulatory Visit: Payer: Self-pay | Admitting: Internal Medicine

## 2020-09-10 DIAGNOSIS — H25813 Combined forms of age-related cataract, bilateral: Secondary | ICD-10-CM | POA: Diagnosis not present

## 2020-09-10 DIAGNOSIS — H40003 Preglaucoma, unspecified, bilateral: Secondary | ICD-10-CM | POA: Diagnosis not present

## 2020-09-10 DIAGNOSIS — H40033 Anatomical narrow angle, bilateral: Secondary | ICD-10-CM | POA: Diagnosis not present

## 2020-09-10 DIAGNOSIS — H21541 Posterior synechiae (iris), right eye: Secondary | ICD-10-CM | POA: Diagnosis not present

## 2020-09-10 DIAGNOSIS — H25812 Combined forms of age-related cataract, left eye: Secondary | ICD-10-CM | POA: Insufficient documentation

## 2020-09-24 ENCOUNTER — Other Ambulatory Visit: Payer: Self-pay

## 2020-09-24 ENCOUNTER — Other Ambulatory Visit: Payer: Medicare Other

## 2020-09-24 DIAGNOSIS — N3001 Acute cystitis with hematuria: Secondary | ICD-10-CM

## 2020-09-24 DIAGNOSIS — Z87440 Personal history of urinary (tract) infections: Secondary | ICD-10-CM | POA: Diagnosis not present

## 2020-09-25 LAB — URINALYSIS, ROUTINE W REFLEX MICROSCOPIC
Bilirubin Urine: NEGATIVE
Glucose, UA: NEGATIVE
Hyaline Cast: NONE SEEN /LPF
Ketones, ur: NEGATIVE
Nitrite: NEGATIVE
Specific Gravity, Urine: 1.017 (ref 1.001–1.03)
WBC, UA: >=60 /HPF — AB (ref 0–5)
pH: 7.5 (ref 5.0–8.0)

## 2020-09-25 LAB — URINE CULTURE
MICRO NUMBER:: 11091119
SPECIMEN QUALITY:: ADEQUATE

## 2020-09-25 NOTE — Progress Notes (Signed)
======================================= =======================================  -   U/C - is OK - No Infection   <><><><><><><><><><><><><><><><><><><><><><><><><><><><><><><><><> <><><><><><><><><><><><><><><><><><><><><><><><><><><><><><><><><> 

## 2020-09-26 ENCOUNTER — Encounter: Payer: Self-pay | Admitting: Adult Health

## 2020-09-26 ENCOUNTER — Ambulatory Visit: Payer: Medicare Other

## 2020-09-26 ENCOUNTER — Other Ambulatory Visit: Payer: Self-pay

## 2020-09-26 ENCOUNTER — Ambulatory Visit: Payer: Medicare Other | Admitting: Adult Health Nurse Practitioner

## 2020-09-26 ENCOUNTER — Ambulatory Visit (INDEPENDENT_AMBULATORY_CARE_PROVIDER_SITE_OTHER): Payer: Medicare Other | Admitting: Adult Health

## 2020-09-26 VITALS — BP 112/70 | HR 90 | Temp 97.7°F

## 2020-09-26 DIAGNOSIS — Z23 Encounter for immunization: Secondary | ICD-10-CM

## 2020-09-26 DIAGNOSIS — H9201 Otalgia, right ear: Secondary | ICD-10-CM

## 2020-09-26 DIAGNOSIS — H6123 Impacted cerumen, bilateral: Secondary | ICD-10-CM

## 2020-09-26 DIAGNOSIS — Z9109 Other allergy status, other than to drugs and biological substances: Secondary | ICD-10-CM | POA: Insufficient documentation

## 2020-09-26 NOTE — Patient Instructions (Signed)
Use a dropper or use a cap to put peroxide, olive oil,mineral oil or canola oil in the effected ear- 2-3 times a week. Let it soak for 20-30 min then you can take a shower or use a baby bulb with warm water to wash out the ear wax.  Can buy debrox wax removal kit over the counter.  Do not use Qtips       Earache, Adult An earache, or ear pain, can be caused by many things, including:  An infection.  Ear wax buildup.  Ear pressure.  Something in the ear that should not be there (foreign body).  A sore throat.  Tooth problems.  Jaw problems. Treatment of the earache will depend on the cause. If the cause is not clear or cannot be determined, you may need to watch your symptoms until your earache goes away or until a cause is found. Follow these instructions at home: Medicines  Take or apply over-the-counter and prescription medicines only as told by your health care provider.  If you were prescribed an antibiotic medicine, use it as told by your health care provider. Do not stop using the antibiotic even if you start to feel better.  Do not put anything in your ear other than medicine that is prescribed by your health care provider. Managing pain If directed, apply heat to the affected area as often as told by your health care provider. Use the heat source that your health care provider recommends, such as a moist heat pack or a heating pad.  Place a towel between your skin and the heat source.  Leave the heat on for 20-30 minutes.  Remove the heat if your skin turns bright red. This is especially important if you are unable to feel pain, heat, or cold. You may have a greater risk of getting burned. If directed, put ice on the affected area as often as told by your health care provider. To do this:      Put ice in a plastic bag.  Place a towel between your skin and the bag.  Leave the ice on for 20 minutes, 2-3 times a day. General instructions  Pay attention  to any changes in your symptoms.  Try resting in an upright position instead of lying down. This may help to reduce pressure in your ear and relieve pain.  Chew gum if it helps to relieve your ear pain.  Treat any allergies as told by your health care provider.  Drink enough fluid to keep your urine pale yellow.  It is up to you to get the results of any tests that were done. Ask your health care provider, or the department that is doing the tests, when your results will be ready.  Keep all follow-up visits as told by your health care provider. This is important. Contact a health care provider if:  Your pain does not improve within 2 days.  Your earache gets worse.  You have new symptoms.  You have a fever. Get help right away if you:  Have a severe headache.  Have a stiff neck.  Have trouble swallowing.  Have redness or swelling behind your ear.  Have fluid or blood coming from your ear.  Have hearing loss.  Feel dizzy. Summary  An earache, or ear pain, can be caused by many things.  Treatment of the earache will depend on the cause. Follow recommendations from your health care provider to treat your ear pain.  If the cause  is not clear or cannot be determined, you may need to watch your symptoms until your earache goes away or until a cause is found.  Keep all follow-up visits as told by your health care provider. This is important. This information is not intended to replace advice given to you by your health care provider. Make sure you discuss any questions you have with your health care provider. Document Revised: 07/01/2019 Document Reviewed: 07/01/2019 Elsevier Patient Education  [place].

## 2020-09-26 NOTE — Progress Notes (Signed)
Assessment and Plan:  Alyssa Schmitt was seen today for follow-up.  Diagnoses and all orders for this visit:  Bilateral impacted cerumen Right ear pain - stop using Qtips, irrigation used in the office without complications, use OTC drops/oil at home to prevent reoccurence  Need for immunization against influenza -     Flu vaccine HIGH DOSE PF  Further disposition pending results of labs. Discussed med's effects and SE's.   Over 30 minutes of exam, counseling, chart review, and critical decision making was performed.   Future Appointments  Date Time Provider [place]  10/15/2020  1:45 PM Marzetta Board, DPM [facility]  11/20/2020  3:30 PM McClanahan, Danton Sewer, NP [facility] None  02/24/2021  3:00 PM McClanahan, Danton Sewer, NP [facility] None    ------------------------------------------------------------------------------------------------------------------   HPI BP 112/70   Pulse 90   Temp 97.7 F (36.5 C)   SpO2 94%   86 y.o.female presents for evaluation of R ear pain that she was experiencing yesterday.  She reports sharp pain in R ear began gradually yesterday, non-radiating, somewhat HOH, not sure if any changes in hearing, denies discharge, tinnitus, nasal/sinus sx, fever/chills, sore throat, HA, dizziness. Daughter put in OTC ear drops which did help.   Also requesting flu vaccine.  Past Medical History:  Diagnosis Date  . COPD (chronic obstructive pulmonary disease) ([place])   . GERD (gastroesophageal reflux disease)   . Hyperlipidemia   . Hypertension   . Morbid obesity ([place]) 08/01/2014  . Osteoporosis   . Prediabetes   . Vitamin D deficiency      Allergies  Allergen Reactions  . Aspirin Swelling  . Celebrex [Celecoxib] Other (See Comments)    "my body lit up"  . Fosamax [Alendronate Sodium] Other (See Comments)    unknown  . Keflex [Cephalexin] Other (See Comments)    unknown  . Penicillins Other (See Comments)    unknown  . Prednisone Itching   . Prevacid [Lansoprazole] Other (See Comments)    unknown  . Sulfa Antibiotics Other (See Comments)    unknown  . Tetanus Toxoids Other (See Comments)    unknown  . Tetracyclines & Related Nausea Only    Nausea     Current Outpatient Medications on File Prior to Visit  Medication Sig  . acetaminophen (TYLENOL) 325 MG tablet Take 2 tablets (650 mg total) by mouth every 6 (six) hours as needed (prn TEMP>38.1 Celsius).  . busPIRone (BUSPAR) 10 MG tablet TAKE 1 TABLET 3 TIMES A DAY FOR CHRONIC ANXIETY  . calcium-vitamin D (OSCAL WITH D) 250-125 MG-UNIT per tablet Take 1 tablet by mouth daily.  . Cholecalciferol (VITAMIN D3) 2000 UNITS TABS Take 4 tablets by mouth daily.   . citalopram (CELEXA) 20 MG tablet Take    1 tablet     Daily     for Mood  . dexamethasone (DECADRON) 4 MG tablet Take 1 tab 3 x day - 3 days, then 2 x day - 3 days, then 1 tab daily  . fexofenadine (ALLEGRA) 180 MG tablet Take 180 mg by mouth daily.   . hydrochlorothiazide (HYDRODIURIL) 25 MG tablet TAKE 1 TABLET DAILY FOR BP & FLUID  . levothyroxine (SYNTHROID) 50 MCG tablet TAKE 1 TABLET DAILY ON AN EMPTY STOMACH WITH ONLY WATER FOR 30 MINUTES & NO ANTACID MEDS, CALCIUM OR MAGNESIUM FOR 4 HOURS & AVOID BIOTIN  . magnesium oxide (MAG-OX) 400 (241.3 MG) MG tablet Take 1 tablet (400 mg total) by mouth 2 (two) times daily. (Patient taking  differently: Take 400 mg by mouth 2 (two) times daily. Takes one tablet daily)  . Multiple Vitamin (MULITIVITAMIN WITH MINERALS) TABS Take 1 tablet by mouth daily.  [name] Kitchen omeprazole (PRILOSEC) 40 MG capsule TAKE 1 CAPSULE BY MOUTH EVERY DAY  . Probiotic Product (PROBIOTIC FORMULA PO) Take 1 tablet by mouth daily.   [name] Kitchen terazosin (HYTRIN) 10 MG capsule Take 1 tablet at Bedtime  for BP   No current facility-administered medications on file prior to visit.    ROS: all negative except above.   Physical Exam:  BP 112/70   Pulse 90   Temp 97.7 F (36.5 C)   SpO2 94%   General Appearance:  Well nourished, in no apparent distress. Eyes: PERRLA, EOMs, conjunctiva no swelling or erythema Sinuses: No Frontal/maxillary tenderness ENT/Mouth: Bil ext aud canals obstructed, TMs without erythema, bulging. No erythema, swelling, or exudate on post pharynx.  Tonsils not swollen or erythematous. Mildly HOH.  Neck: Supple Respiratory: Respiratory effort normal, BS equal bilaterally without rales, rhonchi, wheezing or stridor.  Cardio: RRR with no MRGs. Brisk peripheral pulses without edema.  Abdomen: Soft, + BS.  Non tender Lymphatics: Non tender without lymphadenopathy.  Musculoskeletal: In wheelchair Skin: Warm, dry without rashes, lesions, ecchymosis.  Neuro: Cranial nerves intact. Normal muscle tone,   Psych: Awake and oriented X 3, normal affect, Insight and Judgment appropriate.     Izora Ribas, NP 5:24 PM [facility]

## 2020-09-26 NOTE — Progress Notes (Signed)
[name] (daughter) is aware of patient's lab results. -e welch

## 2020-10-01 ENCOUNTER — Encounter: Payer: Self-pay | Admitting: Adult Health Nurse Practitioner

## 2020-10-01 ENCOUNTER — Ambulatory Visit: Payer: Medicare Other | Admitting: Adult Health

## 2020-10-01 ENCOUNTER — Other Ambulatory Visit (HOSPITAL_COMMUNITY): Payer: Self-pay | Admitting: Adult Health Nurse Practitioner

## 2020-10-01 ENCOUNTER — Ambulatory Visit (INDEPENDENT_AMBULATORY_CARE_PROVIDER_SITE_OTHER): Payer: Medicare Other | Admitting: Adult Health Nurse Practitioner

## 2020-10-01 ENCOUNTER — Other Ambulatory Visit: Payer: Self-pay

## 2020-10-01 VITALS — BP 130/72 | HR 60 | Temp 97.5°F

## 2020-10-01 DIAGNOSIS — M79661 Pain in right lower leg: Secondary | ICD-10-CM

## 2020-10-01 DIAGNOSIS — I10 Essential (primary) hypertension: Secondary | ICD-10-CM

## 2020-10-01 DIAGNOSIS — M7989 Other specified soft tissue disorders: Secondary | ICD-10-CM

## 2020-10-01 DIAGNOSIS — I83893 Varicose veins of bilateral lower extremities with other complications: Secondary | ICD-10-CM | POA: Diagnosis not present

## 2020-10-01 LAB — COMPLETE METABOLIC PANEL WITH GFR
AG Ratio: 1.2 (calc) (ref 1.0–2.5)
ALT: 19 U/L (ref 6–29)
AST: 37 U/L — ABNORMAL HIGH (ref 10–35)
Albumin: 4.2 g/dL (ref 3.6–5.1)
Alkaline phosphatase (APISO): 56 U/L (ref 37–153)
BUN: 17 mg/dL (ref 7–25)
CO2: 31 mmol/L (ref 20–32)
Calcium: 10.3 mg/dL (ref 8.6–10.4)
Chloride: 97 mmol/L — ABNORMAL LOW (ref 98–110)
Creat: 0.84 mg/dL (ref 0.60–0.88)
GFR, Est African American: 71 mL/min/{1.73_m2} (ref >=60)
GFR, Est Non African American: 62 mL/min/{1.73_m2} (ref >=60)
Globulin: 3.4 g/dL (calc) (ref 1.9–3.7)
Glucose, Bld: 111 mg/dL — ABNORMAL HIGH (ref 65–99)
Potassium: 3.7 mmol/L (ref 3.5–5.3)
Sodium: 138 mmol/L (ref 135–146)
Total Bilirubin: 0.8 mg/dL (ref 0.2–1.2)
Total Protein: 7.6 g/dL (ref 6.1–8.1)

## 2020-10-01 LAB — CBC WITH DIFFERENTIAL/PLATELET
Absolute Monocytes: 581 cells/uL (ref 200–950)
Basophils Absolute: 51 cells/uL (ref 0–200)
Basophils Relative: 0.9 %
Eosinophils Absolute: 63 cells/uL (ref 15–500)
Eosinophils Relative: 1.1 %
HCT: 45.4 % — ABNORMAL HIGH (ref 35.0–45.0)
Hemoglobin: 15.5 g/dL (ref 11.7–15.5)
Lymphs Abs: 1579 cells/uL (ref 850–3900)
MCH: 31.1 pg (ref 27.0–33.0)
MCHC: 34.1 g/dL (ref 32.0–36.0)
MCV: 91.2 fL (ref 80.0–100.0)
MPV: 10.4 fL (ref 7.5–12.5)
Monocytes Relative: 10.2 %
Neutro Abs: 3426 cells/uL (ref 1500–7800)
Neutrophils Relative %: 60.1 %
Platelets: 184 10*3/uL (ref 140–400)
RBC: 4.98 10*6/uL (ref 3.80–5.10)
RDW: 12.9 % (ref 11.0–15.0)
Total Lymphocyte: 27.7 %
WBC: 5.7 10*3/uL (ref 3.8–10.8)

## 2020-10-01 LAB — D-DIMER, QUANTITATIVE: D-Dimer, Quant: 0.75 mcg/mL FEU — ABNORMAL HIGH (ref <0.50)

## 2020-10-01 NOTE — Patient Instructions (Signed)
  We are going to check las today and should have them back early tomorrow.  Monitor the area for increasing redness, pain, shortness of breath please seek immediate attention via 911 or nearest ER.  We will schedule an appointment for you for ultrasound to rule out DVT.   [facility] [address] [place] [address] [phone]

## 2020-10-01 NOTE — Progress Notes (Signed)
Assessment and Plan:  Diagnoses and all orders for this visit:  Right calf pain -     US Venous Img Lower Unilateral Right (DVT); Future -     CBC with Differential/Platelet -     COMPLETE METABOLIC PANEL WITH GFR -     D-dimer, quantitative (not at [facility])  Varicose veins of bilateral lower extremities with other complications Continue compression stockings Elevations of lower extremities when sitting.  Essential hypertension Continue current medications: Monitor blood pressure at home; call if consistently over 130/80 Continue DASH diet.   Reminder to go to the ER if any CP, SOB, nausea, dizziness, severe HA, changes vision/speech, left arm numbness and tingling and jaw pain.     Further disposition pending results of labs. Discussed med's effects and SE's.   Over 30 minutes of face to face interview,  exam, counseling, chart review, and critical decision making was performed.   Future Appointments  Date Time Provider [place]  10/02/2020  1:00 PM [facility]  10/15/2020  1:45 PM Marzetta Board, DPM [facility]  11/20/2020  3:30 PM Garnet Sierras, NP [facility] None  02/24/2021  3:00 PM Garnet Sierras, NP [facility] None    ------------------------------------------------------------------------------------------------------------------   HPI 86 y.o.female presents for evaluation of lump on right calf and right heel.  She reports this was visible on Saturday that was non-painful and non-tender.  The daughter noticed this while she was washing her.  No history of falls or trauma.  She reports the area was a significant bulge no discoloration.  Daughter reports a benign cyst to the back of her thigh  One day ago she reports that it disappeared, remains non-tender, no pain.  She is sedentary with her lifestyle and minimal walking, activity.  She has assistive care in the home as well as her daughter to care for her.  She needs  assistance with all ADL's.   Past Medical History:  Diagnosis Date  . COPD (chronic obstructive pulmonary disease) ([place])   . GERD (gastroesophageal reflux disease)   . Hyperlipidemia   . Hypertension   . Morbid obesity ([place]) 08/01/2014  . Osteoporosis   . Prediabetes   . Vitamin D deficiency      Allergies  Allergen Reactions  . Aspirin Swelling  . Celebrex [Celecoxib] Other (See Comments)    "my body lit up"  . Fosamax [Alendronate Sodium] Other (See Comments)    unknown  . Keflex [Cephalexin] Other (See Comments)    unknown  . Penicillins Other (See Comments)    unknown  . Prednisone Itching  . Prevacid [Lansoprazole] Other (See Comments)    unknown  . Sulfa Antibiotics Other (See Comments)    unknown  . Tetanus Toxoids Other (See Comments)    unknown  . Tetracyclines & Related Nausea Only    Nausea     Current Outpatient Medications on File Prior to Visit  Medication Sig  . acetaminophen (TYLENOL) 325 MG tablet Take 2 tablets (650 mg total) by mouth every 6 (six) hours as needed (prn TEMP>38.1 Celsius).  . busPIRone (BUSPAR) 10 MG tablet TAKE 1 TABLET 3 TIMES A DAY FOR CHRONIC ANXIETY  . calcium-vitamin D (OSCAL WITH D) 250-125 MG-UNIT per tablet Take 1 tablet by mouth daily.  . Cholecalciferol (VITAMIN D3) 2000 UNITS TABS Take 4 tablets by mouth daily.   . citalopram (CELEXA) 20 MG tablet Take    1 tablet     Daily     for  Mood  . fexofenadine (ALLEGRA) 180 MG tablet Take 180 mg by mouth daily.   . hydrochlorothiazide (HYDRODIURIL) 25 MG tablet TAKE 1 TABLET DAILY FOR BP & FLUID  . levothyroxine (SYNTHROID) 50 MCG tablet TAKE 1 TABLET DAILY ON AN EMPTY STOMACH WITH ONLY WATER FOR 30 MINUTES & NO ANTACID MEDS, CALCIUM OR MAGNESIUM FOR 4 HOURS & AVOID BIOTIN  . magnesium oxide (MAG-OX) 400 (241.3 MG) MG tablet Take 1 tablet (400 mg total) by mouth 2 (two) times daily. (Patient taking differently: Take 400 mg by mouth 2 (two) times daily. Takes one tablet daily)  .  Multiple Vitamin (MULITIVITAMIN WITH MINERALS) TABS Take 1 tablet by mouth daily.  [name] Kitchen omeprazole (PRILOSEC) 40 MG capsule TAKE 1 CAPSULE BY MOUTH EVERY DAY  . Probiotic Product (PROBIOTIC FORMULA PO) Take 1 tablet by mouth daily.   [name] Kitchen terazosin (HYTRIN) 10 MG capsule Take 1 tablet at Bedtime  for BP   No current facility-administered medications on file prior to visit.    ROS: all negative except above.   Physical Exam:  BP 130/72   Pulse 60   Temp (!) 97.5 F (36.4 C)   General Appearance: Well nourished, in no apparent distress. Eyes: PERRLA, EOMs, conjunctiva no swelling or erythema Sinuses: No Frontal/maxillary tenderness ENT/Mouth: Ext aud canals clear, TMs without erythema, bulging. No erythema, swelling, or exudate on post pharynx.  Tonsils not swollen or erythematous. Hearing impairment. Neck: Supple, thyroid normal.  Respiratory: Respiratory effort normal, BS equal bilaterally without rales, rhonchi, wheezing or stridor.  Cardio: RRR with no MRGs. Brisk peripheral pulses without edema.  Abdomen: Soft, + BS.  Non tender, no guarding, rebound, hernias, masses. Lymphatics: Non tender without lymphadenopathy.  Musculoskeletal: Full ROM, 3/5 strength, did not assess gait, patient ambulates very short distances with walker. Unable to assesss Homan's sign related to rigidity of ankle flexion. Skin: Warm, dry without rashes, lesions.approx 4cm circumference area of ecchymosis.  Tender to palpation, popliteal pulse +1, edema, Posterior thigh edema, firm, non pitting, no erythema noted.  Neuro: Cranial nerves intact. Normal muscle tone, no cerebellar symptoms. Sensation intact.  Psych: Awake and oriented X 3, normal affect, Insight and Judgment appropriate.     Garnet Sierras, NP 4:34 PM [facility]

## 2020-10-02 ENCOUNTER — Ambulatory Visit (HOSPITAL_COMMUNITY)
Admission: RE | Admit: 2020-10-02 | Discharge: 2020-10-02 | Disposition: A | Discharge status: Home / Self Care | Payer: Medicare Other | Source: Ambulatory Visit | Attending: Cardiovascular Disease | Admitting: Cardiovascular Disease

## 2020-10-02 DIAGNOSIS — M7989 Other specified soft tissue disorders: Secondary | ICD-10-CM

## 2020-10-02 DIAGNOSIS — M79661 Pain in right lower leg: Secondary | ICD-10-CM | POA: Diagnosis present

## 2020-10-15 ENCOUNTER — Ambulatory Visit: Payer: Medicare Other | Admitting: Podiatry

## 2020-10-15 ENCOUNTER — Encounter: Payer: Self-pay | Admitting: Podiatry

## 2020-10-15 ENCOUNTER — Other Ambulatory Visit: Payer: Self-pay

## 2020-10-15 DIAGNOSIS — M79674 Pain in right toe(s): Secondary | ICD-10-CM

## 2020-10-15 DIAGNOSIS — M2142 Flat foot [pes planus] (acquired), left foot: Secondary | ICD-10-CM

## 2020-10-15 DIAGNOSIS — B351 Tinea unguium: Secondary | ICD-10-CM | POA: Diagnosis not present

## 2020-10-15 DIAGNOSIS — M2141 Flat foot [pes planus] (acquired), right foot: Secondary | ICD-10-CM

## 2020-10-15 DIAGNOSIS — L600 Ingrowing nail: Secondary | ICD-10-CM

## 2020-10-15 DIAGNOSIS — M79675 Pain in left toe(s): Secondary | ICD-10-CM | POA: Diagnosis not present

## 2020-10-16 ENCOUNTER — Other Ambulatory Visit: Payer: Self-pay | Admitting: Internal Medicine

## 2020-10-19 NOTE — Progress Notes (Signed)
Subjective:  Patient ID: Alyssa Schmitt, female    DOB: 04-Jun-1928,  MRN: [id number]  ADELY FACER presents to clinic today for painful thick toenails that are difficult to trim. Pain interferes with ambulation. Aggravating factors include wearing enclosed shoe gear. Pain is relieved with periodic professional debridement..  Her daughter is present during today's visit. They relate no new problems on today's visit.  Review of Systems: Negative except as noted in the HPI. Past Medical History:  Diagnosis Date  . COPD (chronic obstructive pulmonary disease) ([place])   . GERD (gastroesophageal reflux disease)   . Hyperlipidemia   . Hypertension   . Morbid obesity ([place]) 08/01/2014  . Osteoporosis   . Prediabetes   . Vitamin D deficiency    Past Surgical History:  Procedure Laterality Date  . TONSILLECTOMY     childhood  . VESICOVAGINAL FISTULA CLOSURE W/ TAH      Current Outpatient Medications:  .  acetaminophen (TYLENOL) 325 MG tablet, Take 2 tablets (650 mg total) by mouth every 6 (six) hours as needed (prn TEMP>38.1 Celsius)., Disp: 30 tablet, Rfl: 2 .  busPIRone (BUSPAR) 10 MG tablet, TAKE 1 TABLET 3 TIMES A DAY FOR CHRONIC ANXIETY, Disp: 270 tablet, Rfl: 3 .  calcium-vitamin D (OSCAL WITH D) 250-125 MG-UNIT per tablet, Take 1 tablet by mouth daily., Disp: , Rfl:  .  Cholecalciferol (VITAMIN D3) 2000 UNITS TABS, Take 4 tablets by mouth daily. , Disp: , Rfl:  .  citalopram (CELEXA) 20 MG tablet, Take    1 tablet     Daily     for Mood, Disp: 90 tablet, Rfl: 0 .  fexofenadine (ALLEGRA) 180 MG tablet, Take 180 mg by mouth daily. , Disp: , Rfl:  .  hydrochlorothiazide (HYDRODIURIL) 25 MG tablet, TAKE 1 TABLET DAILY FOR BP & FLUID, Disp: 90 tablet, Rfl: 3 .  levothyroxine (SYNTHROID) 50 MCG tablet, TAKE 1 TABLET DAILY ON AN EMPTY STOMACH WITH ONLY WATER FOR 30 MINUTES & NO ANTACID MEDS, CALCIUM OR MAGNESIUM FOR 4 HOURS & AVOID BIOTIN, Disp: 90 tablet, Rfl: 3 .  magnesium oxide  (MAG-OX) 400 (241.3 MG) MG tablet, Take 1 tablet (400 mg total) by mouth 2 (two) times daily. (Patient taking differently: Take 400 mg by mouth 2 (two) times daily. Takes one tablet daily), Disp: 60 tablet, Rfl: 2 .  Multiple Vitamin (MULITIVITAMIN WITH MINERALS) TABS, Take 1 tablet by mouth daily., Disp: , Rfl:  .  omeprazole (PRILOSEC) 40 MG capsule, TAKE 1 CAPSULE BY MOUTH EVERY DAY, Disp: 90 capsule, Rfl: 4 .  Probiotic Product (PROBIOTIC FORMULA PO), Take 1 tablet by mouth daily. , Disp: , Rfl:  .  terazosin (HYTRIN) 10 MG capsule, TAKE 1 TABLET AT BEDTIME FOR BP, Disp: 90 capsule, Rfl: 3 Allergies  Allergen Reactions  . Aspirin Swelling  . Celebrex [Celecoxib] Other (See Comments)    "my body lit up"  . Fosamax [Alendronate Sodium] Other (See Comments)    unknown  . Keflex [Cephalexin] Other (See Comments)    unknown  . Penicillins Other (See Comments)    unknown  . Prednisone Itching  . Prevacid [Lansoprazole] Other (See Comments)    unknown  . Sulfa Antibiotics Other (See Comments)    unknown  . Tetanus Toxoids Other (See Comments)    unknown  . Tetracyclines & Related Nausea Only    Nausea    Social History   Occupational History  . Not on file  Tobacco Use  . Smoking status:  Never Smoker  . Smokeless tobacco: Never Used  Substance and Sexual Activity  . Alcohol use: No  . Drug use: No  . Sexual activity: Not on file    Objective:   Constitutional KILEIGH ORTMANN is a pleasant 86 y.o. Caucasian female, WD, WN in NAD.[name] Kitchen AAO x 3.   Vascular Neurovascular status unchanged b/l lower extremities. Capillary refill time to digits immediate b/l. Palpable pedal pulses b/l LE. Pedal hair sparse. Lower extremity skin temperature gradient within normal limits. Varicosities present b/l. Evidence of chronic venous insufficiency b/l LE.  No cyanosis or clubbing noted.  Neurologic Normal speech. Oriented to person, place, and time. Protective sensation intact 5/5 intact  bilaterally with 10g monofilament b/l. Vibratory sensation intact b/l.  Dermatologic Pedal skin with normal turgor, texture and tone bilaterally. No open wounds bilaterally. No interdigital macerations bilaterally. Toenails 1-5 b/l elongated, discolored, dystrophic, thickened, crumbly with subungual debris and tenderness to dorsal palpation. Incurvated nailplate medial border(s) R 3rd toe.  Nail border hypertrophy minimal. There is tenderness to palpation. Sign(s) of infection: no clinical signs of infection noted on examination today..  Orthopedic: Normal muscle strength 5/5 to all lower extremity muscle groups bilaterally. No pain crepitus or joint limitation noted with ROM b/l. Pes planus deformity noted b/l.  Utilizes transport chair for ambulation assistance.   Radiographs: None Assessment:   1. Pain due to onychomycosis of toenails of both feet   2. Ingrown toenail without infection   3. Pes planus of both feet    Plan:  Patient was evaluated and treated and all questions answered.  Onychomycosis with pain -Nails palliatively debridement as below -Educated on self-care  Procedure: Nail Debridement Rationale: Pain Type of Debridement: manual, sharp debridement. Instrumentation: Nail nipper, rotary burr. Number of Nails: 10 -Examined patient. -Toenails 1-5 b/l were debrided in length and girth with sterile nail nippers and dremel without iatrogenic bleeding.  -Offending nail border debrided and curretaged R 3rd toe utilizing sterile nail nipper and currette. Border(s) cleansed with alcohol and triple antibiotic ointment applied. Patient instructed to apply Neosporin to R 3rd toe once daily for 14 days. -Patient to report any pedal injuries to medical professional immediately.  Return in about 3 months (around 01/15/2021) for 3 month toenail debridement.  Marzetta Board, DPM

## 2020-11-20 ENCOUNTER — Ambulatory Visit: Payer: Medicare Other | Admitting: Adult Health Nurse Practitioner

## 2020-11-23 ENCOUNTER — Other Ambulatory Visit: Payer: Self-pay | Admitting: Internal Medicine

## 2020-12-03 ENCOUNTER — Other Ambulatory Visit: Payer: Self-pay

## 2020-12-03 ENCOUNTER — Ambulatory Visit (INDEPENDENT_AMBULATORY_CARE_PROVIDER_SITE_OTHER): Payer: Medicare Other | Admitting: Adult Health Nurse Practitioner

## 2020-12-03 ENCOUNTER — Encounter: Payer: Self-pay | Admitting: Adult Health Nurse Practitioner

## 2020-12-03 VITALS — BP 132/70 | HR 59 | Temp 97.3°F

## 2020-12-03 DIAGNOSIS — I10 Essential (primary) hypertension: Secondary | ICD-10-CM | POA: Diagnosis not present

## 2020-12-03 DIAGNOSIS — E559 Vitamin D deficiency, unspecified: Secondary | ICD-10-CM | POA: Diagnosis not present

## 2020-12-03 DIAGNOSIS — E038 Other specified hypothyroidism: Secondary | ICD-10-CM

## 2020-12-03 DIAGNOSIS — K219 Gastro-esophageal reflux disease without esophagitis: Secondary | ICD-10-CM | POA: Diagnosis not present

## 2020-12-03 DIAGNOSIS — M810 Age-related osteoporosis without current pathological fracture: Secondary | ICD-10-CM

## 2020-12-03 DIAGNOSIS — R6889 Other general symptoms and signs: Secondary | ICD-10-CM

## 2020-12-03 DIAGNOSIS — H35033 Hypertensive retinopathy, bilateral: Secondary | ICD-10-CM

## 2020-12-03 DIAGNOSIS — Z0000 Encounter for general adult medical examination without abnormal findings: Secondary | ICD-10-CM

## 2020-12-03 DIAGNOSIS — J42 Unspecified chronic bronchitis: Secondary | ICD-10-CM | POA: Diagnosis not present

## 2020-12-03 DIAGNOSIS — F3342 Major depressive disorder, recurrent, in full remission: Secondary | ICD-10-CM

## 2020-12-03 DIAGNOSIS — Z0001 Encounter for general adult medical examination with abnormal findings: Secondary | ICD-10-CM | POA: Diagnosis not present

## 2020-12-03 DIAGNOSIS — R7309 Other abnormal glucose: Secondary | ICD-10-CM

## 2020-12-03 DIAGNOSIS — Z79899 Other long term (current) drug therapy: Secondary | ICD-10-CM

## 2020-12-03 DIAGNOSIS — I83893 Varicose veins of bilateral lower extremities with other complications: Secondary | ICD-10-CM

## 2020-12-03 NOTE — Progress Notes (Signed)
MEDICARE ANNUAL WELLNESS VISIT AND FOLLOW UP  Assessment:   Encounter for Annual Medicare Wellness Visit Yearly  Essential hypertension -     Continue hctz 25 mg daily, terazosin 10 mg daily -     CBC with Differential/Platelet -     CMP/GFR -     Magnesium  Chronic bronchitis, unspecified chronic bronchitis type ([place])       -     Stable; patient reports she is able to complete ADLs without difficulty  Gastroesophageal reflux disease, esophagitis presence not specified -     Magnesium -     Continue H2blocker/PPI  Hypothyroidism -     Continue levothyroxine 50 mcg daily -     TSH -     Lipid panel  Osteoporosis, unspecified osteoporosis type, unspecified pathological fracture presence -     VITAMIN D 25 Hydroxy (Vit-D Deficiency, Fractures), calcium supplementation -     Has not tolerated medications -     Encouraged to continue with home exercises/weights -     Consider PT as needed for maintenance of strength and stability  Vitamin D deficiency -     VITAMIN D 25 Hydroxy (Vit-D Deficiency, Fractures)  Obesity (BMI 30-39.9)      -      Patient is working on home exercises; BMI trending down slowly. Discussed diet, encouraged reduced sugar/white flour products, push vegetables, lean proteins, whole grains.   Depression, major, in remission ([place])      -      Currently well managed with celexa 20 mg daily, buspar 10 mg TID PRN anxiety   Hypertensive retinopathy of both eyes      -      Continue close monitoring of BPs; continue annual follow up with Dr. Payton [facility] office  Varicose veins of bilateral lower extremities with other complications        -      Continue with support hose daily  Abnormal glucose Recent A1Cs at goal Discussed diet/exercise, weight management  Defer A1C; check CMP   Medication management Continued   Further disposition pending results if labs check today. Discussed med's effects and SE's.   Over 30 minutes of face to face interview,  exam, counseling, chart review, and critical decision making was performed.    Future Appointments  Date Time Provider [place]  01/21/2021  3:15 PM Marzetta Board, DPM [facility]  03/03/2021  2:00 PM Garnet Sierras, NP [facility] None  12/03/2021  3:30 PM Garnet Sierras, NP [facility] None     Plan:   During the course of the visit the patient was educated and counseled about appropriate screening and preventive services including:    Pneumococcal vaccine   Prevnar 13  Influenza vaccine  Td vaccine  Screening electrocardiogram  Bone densitometry screening  Colorectal cancer screening  Diabetes screening  Glaucoma screening  Nutrition counseling   Advanced directives: requested   Subjective:  Alyssa Schmitt is a 86 y.o. very pleasant [place] female who presents for Medicare Annual Wellness Visit and 3 month follow up on chronic medical conditions.   She denies any concerns today, reports she has been very well these past few months, and has been working hard on a home exercise routine that she can do from her chair and with her walker.   She is driven by her daughter whom she lives with after her husband passed away. She also has a caregiver that comes from 9-5 to help her with her  ADL's.  She was last seen in our office on 10/01/20 for right lower extremity calf pain.  She has been seen by [facility] for thick toenails & debridement.  she has a diagnosis of GERD which is currently managed by omeprazole 40 mg  she reports symptoms is currently well controlled, and denies breakthrough reflux, burning in chest, hoarseness or cough.    BMI is There is no height or weight on file to calculate BMI., she has been working on diet and exercise (does chair exercises and walks in home with walker).  Wt Readings from Last 3 Encounters:  08/19/20 180 lb (81.6 kg)  07/29/20 183 lb (83 kg)  09/25/19 183 lb (83 kg)   Her blood  pressure has been controlled at home, today their BP is BP: 132/70  She does workout. She denies chest pain, shortness of breath, dizziness.   She is not on cholesterol medication and denies myalgias. Her cholesterol is at goal. The cholesterol last visit was:   Lab Results  Component Value Date   CHOL 192 02/06/2020   HDL 55 02/06/2020   LDLCALC 118 (H) 02/06/2020   TRIG 87 02/06/2020   CHOLHDL 3.5 02/06/2020    She has been working on diet and exercise for glucose management, and denies increased appetite, nausea, paresthesia of the feet, polydipsia and polyuria. Last A1C in the office was:  Lab Results  Component Value Date   HGBA1C 5.4 08/19/2020   She is on thyroid medication. Her medication was not changed last visit.   Lab Results  Component Value Date   TSH 3.04 08/19/2020   Patient is on Vitamin D supplement and at goal:  Lab Results  Component Value Date   VD25OH 111 (H) 08/19/2020      Medication Review: Current Outpatient Medications on File Prior to Visit  Medication Sig Dispense Refill  . acetaminophen (TYLENOL) 325 MG tablet Take 2 tablets (650 mg total) by mouth every 6 (six) hours as needed (prn TEMP>38.1 Celsius). 30 tablet 2  . busPIRone (BUSPAR) 10 MG tablet TAKE 1 TABLET 3 TIMES A DAY FOR CHRONIC ANXIETY 270 tablet 3  . calcium-vitamin D (OSCAL WITH D) 250-125 MG-UNIT per tablet Take 1 tablet by mouth daily.    . Cholecalciferol (VITAMIN D3) 2000 UNITS TABS Take 4 tablets by mouth daily.     . citalopram (CELEXA) 20 MG tablet Take     1 tablet     Daily     For Mood      TAKE 1 TABLET BY MOUTH DAILY FOR MOOD 90 tablet 0  . fexofenadine (ALLEGRA) 180 MG tablet Take 180 mg by mouth daily.     . hydrochlorothiazide (HYDRODIURIL) 25 MG tablet TAKE 1 TABLET DAILY FOR BP & FLUID 90 tablet 3  . levothyroxine (SYNTHROID) 50 MCG tablet TAKE 1 TABLET DAILY ON AN EMPTY STOMACH WITH ONLY WATER FOR 30 MINUTES & NO ANTACID MEDS, CALCIUM OR MAGNESIUM FOR 4 HOURS & AVOID  BIOTIN 90 tablet 3  . magnesium oxide (MAG-OX) 400 (241.3 MG) MG tablet Take 1 tablet (400 mg total) by mouth 2 (two) times daily. (Patient taking differently: Take 400 mg by mouth 2 (two) times daily. Takes one tablet daily) 60 tablet 2  . Multiple Vitamin (MULITIVITAMIN WITH MINERALS) TABS Take 1 tablet by mouth daily.    [name] Kitchen omeprazole (PRILOSEC) 40 MG capsule TAKE 1 CAPSULE BY MOUTH EVERY DAY 90 capsule 4  . Probiotic Product (PROBIOTIC FORMULA PO)  Take 1 tablet by mouth daily.     [name] Kitchen terazosin (HYTRIN) 10 MG capsule TAKE 1 TABLET AT BEDTIME FOR BP 90 capsule 3   No current facility-administered medications on file prior to visit.    Allergies  Allergen Reactions  . Aspirin Swelling  . Celebrex [Celecoxib] Other (See Comments)    "my body lit up"  . Fosamax [Alendronate Sodium] Other (See Comments)    unknown  . Keflex [Cephalexin] Other (See Comments)    unknown  . Penicillins Other (See Comments)    unknown  . Prednisone Itching  . Prevacid [Lansoprazole] Other (See Comments)    unknown  . Sulfa Antibiotics Other (See Comments)    unknown  . Tetanus Toxoids Other (See Comments)    unknown  . Tetracyclines & Related Nausea Only    Nausea     Current Problems (verified) Patient Active Problem List   Diagnosis Date Noted  . Environmental allergies 09/26/2020  . Combined form of age-related cataract, left eye 09/10/2020  . Acquired female bladder prolapse 03/06/2020  . Pressure injury of buttock, stage 1 03/06/2020  . Elevated LFTs 08/26/2018  . Hypertensive retinopathy of both eyes 09/21/2017  . Depression, major, in remission ([facility]) 06/22/2016  . COPD 08/15/2015  . GERD  08/15/2015  . Osteoporosis 07/16/2015  . Hypothyroidism   . Medication management 03/20/2014  . Essential hypertension   . Hyperlipidemia, mixed   . Abnormal glucose   . Vitamin D deficiency   . Varicose veins of bilateral lower extremities with other complications 07/19/2012    Screening  Tests Immunization History  Administered Date(s) Administered  . Influenza, High Dose Seasonal PF 09/05/2014, 09/17/2015, 08/25/2016, 09/22/2017, 08/25/2018, 09/25/2019, 09/26/2020  . PFIZER SARS-COV-2 Vaccination 01/19/2020  . Pneumococcal Conjugate-13 11/20/2015  . Pneumococcal Polysaccharide-23 12/18/2007  . Zoster 07/25/2013   Preventative care: Last colonoscopy: 2001 will not get another Last mammogram:09/2019 Last pap smear/pelvic exam: Remote with Dr. Henderson Cloud   DEXA: 2016 T -3.1 Intolerant of medications  Echo: 2013  Prior vaccinations:  TD or Tdap: ALLERGY  Influenza: 09/26/2020 Pneumococcal: 2009 Prevnar 13: 2016 Shingles/Zostavax: 2014   JAN 13, she is having catact surgery.   Names of Other Physician/Practitioners you currently use: 1. [facility] here for primary care 2. Dr. Wynell Balloon, eye doctor, last visit early this year 2020 3. Wears full dentures, last dentist several years ago, denies issues  Patient Care Team: Lucky Cowboy, MD as PCP - General (Internal Medicine) Nahser, Deloris Ping, MD (Cardiology) Lucky Cowboy, MD as Attending Physician (Internal Medicine) Loree Fee, PA-C as Physician Assistant (Physician Assistant) Mateo Flow, MD as Consulting Physician (Ophthalmology) Pryor Ochoa, MD (Inactive) as Consulting Physician (Vascular Surgery) Veryl Speak, MD as Referring Physician (Radiology) Valeria Batman, MD as Consulting Physician (Orthopedic Surgery) Vida Rigger, MD as Consulting Physician (Gastroenterology)  SURGICAL HISTORY She  has a past surgical history that includes Tonsillectomy and Vesicovaginal fistula closure w/ TAH. FAMILY HISTORY Her family history includes Diabetes in her father; Heart disease in her mother; Other in her brother and mother. SOCIAL HISTORY She  reports that she has never smoked. She has never used smokeless tobacco. She reports that she does not  drink alcohol and does not use drugs.   MEDICARE WELLNESS OBJECTIVES: Physical activity: Current Exercise Habits: The patient does not participate in regular exercise at present, Exercise limited by: orthopedic condition(s) Cardiac risk factors: Cardiac Risk Factors include: advanced age (>61men, >30 women);dyslipidemia;obesity (BMI >30kg/m2);sedentary lifestyle  Depression/mood screen:   Depression screen [facility] 2/9 12/03/2020  Decreased Interest 0  Down, Depressed, Hopeless 0  PHQ - 2 Score 0    ADLs:  In your present state of health, do you have any difficulty performing the following activities: 12/03/2020 08/18/2020  Hearing? Y N  Vision? N N  Difficulty concentrating or making decisions? N N  Walking or climbing stairs? Y N  Comment Lives with daughter and nursing help during the day. -  Dressing or bathing? Y N  Doing errands, shopping? Y N  Preparing Food and eating ? Y -  Using the Toilet? Y -  In the past six months, have you accidently leaked urine? Y -  Do you have problems with loss of bowel control? N -  Managing your Medications? Y -  Comment Has help in the home, daughter and caregiver. -  Managing your Finances? Y -  Housekeeping or managing your Housekeeping? Y -  Some recent data might be hidden     Cognitive Testing  Alert? Yes  Normal Appearance?Yes  Oriented to person? Yes  Place? Yes   Time? Yes  Recall of three objects?  Yes  Can perform simple calculations? Yes  Displays appropriate judgment?Yes  Can read the correct time from a watch face?Yes  EOL planning: Does Patient Have a Medical Advance Directive?: Yes Type of Advance Directive: [place] will Does patient want to make changes to medical advance directive?: No - Patient declined  Review of Systems  Constitutional: Negative.  Negative for malaise/fatigue.  HENT: Negative for congestion, hearing loss and sore throat.   Eyes: Negative for blurred vision.  Respiratory:  Negative for cough, sputum production, shortness of breath and wheezing.   Cardiovascular: Negative for chest pain, palpitations, orthopnea, claudication and leg swelling.  Gastrointestinal: Negative for abdominal pain, blood in stool, constipation, diarrhea, heartburn, melena, nausea and vomiting.  Genitourinary: Negative.  Negative for frequency.  Musculoskeletal: Negative for falls, joint pain and myalgias.  Skin: Negative.   Neurological: Negative for dizziness, tingling, tremors, sensory change, focal weakness, weakness and headaches.  Endo/Heme/Allergies: Negative.  Negative for polydipsia. Does not bruise/bleed easily.  Psychiatric/Behavioral: Negative.  Negative for depression, memory loss and suicidal ideas.     Objective:     Today's Vitals   12/03/20 1536  BP: 132/70  Pulse: (!) 59  Temp: (!) 97.3 F (36.3 C)  SpO2: 95%  PainSc: 0-No pain   There is no height or weight on file to calculate BMI. General Appearance: Well nourished, in no apparent distress. Eyes: PERRLA, EOMs, conjunctiva no swelling or erythema Sinuses: No Frontal/maxillary tenderness ENT/Mouth: Ext aud canals clear, TMs without erythema, bulging. No erythema, swelling, or exudate on post pharynx.  Tonsils not swollen or erythematous. Hearing normal.  Neck: Supple, thyroid normal.  Respiratory: Respiratory effort normal, BS equal bilaterally without rales, rhonchi, wheezing or stridor.  Cardio: RRR with no MRGs. Brisk peripheral pulses without edema.  Abdomen: Soft, + BS.  Non tender, no guarding, rebound, hernias, masses. Lymphatics: Non tender without lymphadenopathy.  Musculoskeletal: Full ROM, 5/5 strength, Normal gait Skin: Warm, dry without rashes, lesions, ecchymosis.  Neuro: Cranial nerves intact. No cerebellar symptoms.  Psych: Awake and oriented X 3, normal affect, Insight and Judgment appropriate.   Medicare Attestation I have personally reviewed: The patient's medical and social  history Their use of alcohol, tobacco or illicit drugs Their current medications and supplements The patient's functional ability including ADLs,fall risks, home safety risks, cognitive, and  hearing and visual impairment Diet and physical activities Evidence for depression or mood disorders  The patient's weight, height, BMI, and visual acuity have been recorded in the chart.  I have made referrals, counseling, and provided education to the patient based on review of the above and I have provided the patient with a written personalized care plan for preventive services.     Garnet Sierras, NP   12/03/2020

## 2020-12-04 LAB — COMPLETE METABOLIC PANEL WITH GFR
AG Ratio: 1.3 (calc) (ref 1.0–2.5)
ALT: 21 U/L (ref 6–29)
AST: 35 U/L (ref 10–35)
Albumin: 4.2 g/dL (ref 3.6–5.1)
Alkaline phosphatase (APISO): 54 U/L (ref 37–153)
BUN: 25 mg/dL (ref 7–25)
CO2: 33 mmol/L — ABNORMAL HIGH (ref 20–32)
Calcium: 10.3 mg/dL (ref 8.6–10.4)
Chloride: 97 mmol/L — ABNORMAL LOW (ref 98–110)
Creat: 0.86 mg/dL (ref 0.60–0.88)
GFR, Est African American: 69 mL/min/{1.73_m2} (ref >=60)
GFR, Est Non African American: 59 mL/min/{1.73_m2} — ABNORMAL LOW (ref >=60)
Globulin: 3.2 g/dL (calc) (ref 1.9–3.7)
Glucose, Bld: 102 mg/dL — ABNORMAL HIGH (ref 65–99)
Potassium: 3.6 mmol/L (ref 3.5–5.3)
Sodium: 139 mmol/L (ref 135–146)
Total Bilirubin: 0.5 mg/dL (ref 0.2–1.2)
Total Protein: 7.4 g/dL (ref 6.1–8.1)

## 2020-12-04 LAB — CBC WITH DIFFERENTIAL/PLATELET
Absolute Monocytes: 523 cells/uL (ref 200–950)
Basophils Absolute: 39 cells/uL (ref 0–200)
Basophils Relative: 0.7 %
Eosinophils Absolute: 50 cells/uL (ref 15–500)
Eosinophils Relative: 0.9 %
HCT: 43.8 % (ref 35.0–45.0)
Hemoglobin: 15.1 g/dL (ref 11.7–15.5)
Lymphs Abs: 1832 cells/uL (ref 850–3900)
MCH: 30.8 pg (ref 27.0–33.0)
MCHC: 34.5 g/dL (ref 32.0–36.0)
MCV: 89.2 fL (ref 80.0–100.0)
MPV: 10.6 fL (ref 7.5–12.5)
Monocytes Relative: 9.5 %
Neutro Abs: 3058 cells/uL (ref 1500–7800)
Neutrophils Relative %: 55.6 %
Platelets: 183 10*3/uL (ref 140–400)
RBC: 4.91 10*6/uL (ref 3.80–5.10)
RDW: 12 % (ref 11.0–15.0)
Total Lymphocyte: 33.3 %
WBC: 5.5 10*3/uL (ref 3.8–10.8)

## 2020-12-04 LAB — TSH: TSH: 3.03 mIU/L (ref 0.40–4.50)

## 2020-12-06 ENCOUNTER — Other Ambulatory Visit: Payer: Self-pay | Admitting: Internal Medicine

## 2020-12-06 MED ORDER — DEXAMETHASONE 4 MG PO TABS: ORAL_TABLET | ORAL | 0 refills | Status: DC

## 2020-12-06 MED ORDER — PROMETHAZINE-DM 6.25-15 MG/5ML PO SYRP: ORAL_SOLUTION | ORAL | 1 refills | Status: DC

## 2020-12-07 ENCOUNTER — Other Ambulatory Visit: Payer: Self-pay | Admitting: Internal Medicine

## 2020-12-07 MED ORDER — DEXAMETHASONE 4 MG PO TABS
ORAL_TABLET | ORAL | 0 refills | Status: DC
Start: 2020-12-07 — End: 2020-12-07

## 2020-12-07 MED ORDER — DEXAMETHASONE 4 MG PO TABS: ORAL_TABLET | ORAL | 0 refills | Status: DC

## 2021-01-13 ENCOUNTER — Ambulatory Visit (INDEPENDENT_AMBULATORY_CARE_PROVIDER_SITE_OTHER): Payer: Medicare Other | Admitting: Adult Health

## 2021-01-13 ENCOUNTER — Other Ambulatory Visit: Payer: Self-pay

## 2021-01-13 ENCOUNTER — Ambulatory Visit: Payer: Medicare Other | Admitting: Adult Health Nurse Practitioner

## 2021-01-13 ENCOUNTER — Encounter: Payer: Self-pay | Admitting: Adult Health

## 2021-01-13 VITALS — BP 120/78 | HR 102 | Temp 97.5°F

## 2021-01-13 DIAGNOSIS — B372 Candidiasis of skin and nail: Secondary | ICD-10-CM

## 2021-01-13 DIAGNOSIS — S31811A Laceration without foreign body of right buttock, initial encounter: Secondary | ICD-10-CM

## 2021-01-13 MED ORDER — NYSTATIN 100000 UNIT/GM EX POWD: CUTANEOUS | 2 refills | Status: AC

## 2021-01-13 NOTE — Progress Notes (Deleted)
Assessment and Plan:  There are no diagnoses linked to this encounter.    Further disposition pending results of labs. Discussed med's effects and SE's.   Over 30 minutes of exam, counseling, chart review, and critical decision making was performed.   Future Appointments  Date Time Provider [place]  01/13/2021  2:15 PM Garnet Sierras, NP [facility] None  01/21/2021  3:15 PM Marzetta Board, DPM [facility]  03/03/2021  2:00 PM Garnet Sierras, NP [facility] None  12/03/2021  3:30 PM Terreon Ekholm, Danton Sewer, NP [facility] None    ------------------------------------------------------------------------------------------------------------------   HPI 87 y.o.female presents for  Past Medical History:  Diagnosis Date  . COPD (chronic obstructive pulmonary disease) ([place])   . GERD (gastroesophageal reflux disease)   . Hyperlipidemia   . Hypertension   . Morbid obesity ([place]) 08/01/2014  . Osteoporosis   . Prediabetes   . Vitamin D deficiency      Allergies  Allergen Reactions  . Aspirin Swelling  . Celebrex [Celecoxib] Other (See Comments)    "my body lit up"  . Fosamax [Alendronate Sodium] Other (See Comments)    unknown  . Keflex [Cephalexin] Other (See Comments)    unknown  . Penicillins Other (See Comments)    unknown  . Prednisone Itching  . Prevacid [Lansoprazole] Other (See Comments)    unknown  . Sulfa Antibiotics Other (See Comments)    unknown  . Tetanus Toxoids Other (See Comments)    unknown  . Tetracyclines & Related Nausea Only    Nausea     Current Outpatient Medications on File Prior to Visit  Medication Sig  . acetaminophen (TYLENOL) 325 MG tablet Take 2 tablets (650 mg total) by mouth every 6 (six) hours as needed (prn TEMP>38.1 Celsius).  . busPIRone (BUSPAR) 10 MG tablet TAKE 1 TABLET 3 TIMES A DAY FOR CHRONIC ANXIETY  . calcium-vitamin D (OSCAL WITH D) 250-125 MG-UNIT per tablet Take 1 tablet by mouth daily.  . Cholecalciferol  (VITAMIN D3) 2000 UNITS TABS Take 4 tablets by mouth daily.   . citalopram (CELEXA) 20 MG tablet Take     1 tablet     Daily     For Mood      TAKE 1 TABLET BY MOUTH DAILY FOR MOOD  . dexamethasone (DECADRON) 4 MG tablet Take 1 tab 3 x /day for 2 days,      then 2 x /day for 2  Days,     then 1 tab daily  . fexofenadine (ALLEGRA) 180 MG tablet Take 180 mg by mouth daily.   . hydrochlorothiazide (HYDRODIURIL) 25 MG tablet TAKE 1 TABLET DAILY FOR BP & FLUID  . levothyroxine (SYNTHROID) 50 MCG tablet TAKE 1 TABLET DAILY ON AN EMPTY STOMACH WITH ONLY WATER FOR 30 MINUTES & NO ANTACID MEDS, CALCIUM OR MAGNESIUM FOR 4 HOURS & AVOID BIOTIN  . magnesium oxide (MAG-OX) 400 (241.3 MG) MG tablet Take 1 tablet (400 mg total) by mouth 2 (two) times daily. (Patient taking differently: Take 400 mg by mouth 2 (two) times daily. Takes one tablet daily)  . Multiple Vitamin (MULITIVITAMIN WITH MINERALS) TABS Take 1 tablet by mouth daily.  [name] Kitchen omeprazole (PRILOSEC) 40 MG capsule TAKE 1 CAPSULE BY MOUTH EVERY DAY  . Probiotic Product (PROBIOTIC FORMULA PO) Take 1 tablet by mouth daily.   . promethazine-dextromethorphan (PROMETHAZINE-DM) 6.25-15 MG/5ML syrup Take 1 to 2 tsp every 4 hours if needed for cough  . terazosin (HYTRIN) 10 MG capsule TAKE 1 TABLET AT BEDTIME  FOR BP   No current facility-administered medications on file prior to visit.    ROS: all negative except above.   Physical Exam:  There were no vitals taken for this visit.  General Appearance: Well nourished, in no apparent distress. Eyes: PERRLA, EOMs, conjunctiva no swelling or erythema Sinuses: No Frontal/maxillary tenderness ENT/Mouth: Ext aud canals clear, TMs without erythema, bulging. No erythema, swelling, or exudate on post pharynx.  Tonsils not swollen or erythematous. Hearing normal.  Neck: Supple, thyroid normal.  Respiratory: Respiratory effort normal, BS equal bilaterally without rales, rhonchi, wheezing or stridor.  Cardio: RRR with  no MRGs. Brisk peripheral pulses without edema.  Abdomen: Soft, + BS.  Non tender, no guarding, rebound, hernias, masses. Lymphatics: Non tender without lymphadenopathy.  Musculoskeletal: Full ROM, 5/5 strength, normal gait.  Skin: Warm, dry without rashes, lesions, ecchymosis.  Neuro: Cranial nerves intact. Normal muscle tone, no cerebellar symptoms. Sensation intact.  Psych: Awake and oriented X 3, normal affect, Insight and Judgment appropriate.     Garnet Sierras, NP 9:04 AM [facility]

## 2021-01-13 NOTE — Patient Instructions (Signed)
Recommend using donut pillow and/or lying on alternating sides to avoid putting pressure on bottom until healed up   1-2 times daily use soft rag with warm soapy water to soak and rub away excess cream or scabs - look closely for worsening  Then use calmoseptin barrier cream prior to pull ups  Continue zinc, vitamin C   Check regularly for blanching - press down on red areas - getting white then red again means circulation is still ok   If getting worse let me know - monitor for worsening and contact office if any concerns     Pressure Injury  A pressure injury is damage to the skin and underlying tissue that results from pressure being applied to an area of the body. It often affects people who must spend a long time in a bed or chair because of a medical condition. Pressure injuries usually occur:  Over bony parts of the body, such as the tailbone, shoulders, elbows, hips, heels, spine, ankles, and back of the head.  Under medical devices that make contact with the body, such as respiratory equipment, stockings, tubes, and splints. Pressure injuries start as reddened areas on the skin and can lead to pain and an open wound. What are the causes? This condition is caused by frequent or constant pressure to an area of the body. Decreased blood flow to the skin can eventually cause the skin tissue to die and break down, causing a wound. What increases the risk? You are more likely to develop this condition if you:  Are in the hospital or an extended care facility.  Are bedridden or in a wheelchair.  Have an injury or disease that keeps you from: ? Moving normally. ? Feeling pain or pressure.  Have a condition that: ? Makes you sleepy or less alert. ? Causes poor blood flow.  Need to wear a medical device.  Have poor control of your bladder or bowel functions (incontinence).  Have poor nutrition (malnutrition). If you are at risk for pressure injuries, your health  care provider may recommend certain types of mattresses, mattress covers, pillows, cushions, or boots to help prevent them. These may include products filled with air, foam, gel, or sand. What are the signs or symptoms? Symptoms of this condition depend on the severity of the injury. Symptoms may include:  Red or dark areas of the skin.  Pain, warmth, or a change of skin texture.  Blisters.  An open wound. How is this diagnosed? This condition is diagnosed with a medical history and physical exam. You may also have tests, such as:  Blood tests.  Imaging tests.  Blood flow tests. Your pressure injury will be staged based on its severity. Staging is based on:  The depth of the tissue injury, including whether there is exposure of muscle, bone, or tendon.  The cause of the pressure injury. How is this treated? This condition may be treated by:  Relieving or redistributing pressure on your skin. This includes: ? Frequently changing your position. ? Avoiding positions that caused the wound or that can make the wound worse. ? Using specific bed mattresses, chair cushions, or protective boots. ? Moving medical devices from an area of pressure, or placing padding between the skin and the device. ? Using foams, creams, or powders to prevent rubbing (friction) on the skin.  Keeping your skin clean and dry. This may include using a skin cleanser or skin barrier as told by your health care provider.  Cleaning your injury and removing any dead tissue from the wound (debridement).  Placing a bandage (dressing) over your injury.  Using medicines for pain or to prevent or treat infection. Surgery may be needed if other treatments are not working or if your injury is very deep. Follow these instructions at home: Wound care  Follow instructions from your health care provider about how to take care of your wound. Make sure you: ? Wash your hands with soap and water before and after you  change your bandage (dressing). If soap and water are not available, use hand sanitizer. ? Change your dressing as told by your health care provider.  Check your wound every day for signs of infection. Have a caregiver do this for you if you are not able. Check for: ? Redness, swelling, or increased pain. ? More fluid or blood. ? Warmth. ? Pus or a bad smell. Skin care  Keep your skin clean and dry. Gently pat your skin dry.  Do not rub or massage your skin.  You or a caregiver should check your skin every day for any changes in color or any new blisters or sores (ulcers). Medicines  Take over-the-counter and prescription medicines only as told by your health care provider.  If you were prescribed an antibiotic medicine, take or apply it as told by your health care provider. Do not stop using the antibiotic even if your condition improves. Reducing and redistributing pressure  Do not lie or sit in one position for a long time. Move or change position every 1-2 hours, or as told by your health care provider.  Use pillows or cushions to reduce pressure. Ask your health care provider to recommend cushions or pads for you. General instructions  Eat a healthy diet that includes lots of protein.  Drink enough fluid to keep your urine pale yellow.  Be as active as you can every day. Ask your health care provider to suggest safe exercises or activities.  Do not abuse drugs or alcohol.  Do not use any products that contain nicotine or tobacco, such as cigarettes, e-cigarettes, and chewing tobacco. If you need help quitting, ask your health care provider.  Keep all follow-up visits as told by your health care provider. This is important.   Contact a health care provider if:  You have: ? A fever or chills. ? Pain that is not helped by medicine. ? Any changes in skin color. ? New blisters or sores. ? Pus or a bad smell coming from your wound. ? Redness, swelling, or pain around  your wound. ? More fluid or blood coming from your wound.  Your wound does not improve after 1-2 weeks of treatment. Summary  A pressure injury is damage to the skin and underlying tissue that results from pressure being applied to an area of the body.  Do not lie or sit in one position for a long time. Your health care provider may advise you to move or change position every 1-2 hours.  Follow instructions from your health care provider about how to take care of your wound.  Keep all follow-up visits as told by your health care provider. This is important. This information is not intended to replace advice given to you by your health care provider. Make sure you discuss any questions you have with your health care provider. Document Revised: 06/22/2018 Document Reviewed: 06/22/2018 Elsevier Patient Education  [place].

## 2021-01-13 NOTE — Progress Notes (Signed)
Assessment and Plan:  Diagnoses and all orders for this visit:  Tear of skin of right buttock, initial encounter Superficial tear/break in skin with questionable possible pressure injury though currently appears skin is blanching throughout; doesn't appear to be active infection Very limited mobility, sedentary; discussed importance of displacing weight to avoid progression to pressure injury She may use donut pillow over area of break down in seated position for ~2 hours if needed; also position supine with weight shifted laterally; use pillows between knees and ankles, may utilize wedge pillow to help; change position every 1-2 hours Clear area with warm soapy water 1-2 times daily, then apply Calmoseptine or other barrier cream to area prior to pull ups Get on zinc and vitamin C supplements, increase protein intake Monitor progress daily for healing progress Follow up in office 2 weeks or sooner if worsening or concerning changes such as foul odor, purulent discharge, no longer blanching Jeani Hawking expresses understanding and denies further questions at this time  Intertriginous candidiasis Discussed keeping abdominal folds clean, dry; after shower dry thoroughly; apply clean towels between folds if needed; apply nystatin 1-2 times daily as needed until resolved -     nystatin (MYCOSTATIN/NYSTOP) powder; Apply daily after a shower as needed  Further disposition pending results of labs. Discussed med's effects and SE's.   Over 30 minutes of exam, counseling, chart review, and critical decision making was performed.   Future Appointments  Date Time Provider Center  01/21/2021  3:15 PM Marzetta Board, DPM [facility]  03/03/2021  2:00 PM Garnet Sierras, NP [facility] None  12/03/2021  3:30 PM McClanahan, Danton Sewer, NP [facility] None    ------------------------------------------------------------------------------------------------------------------   HPI BP 120/78    Pulse (!) 102   Temp (!) 97.5 F (36.4 C)   SpO2 92%   87 y.o.female with hx of pressure injury of buttock presents for evaluation of a wound to her bottom; Patient is notably very sedentary, limited mobility without significant assistance, wears pull ups.  Her daughter [name] who usually accompanies is in the hospital; family friend Jeani Hawking is with her today. Reports has home health coming in daily to help care for her 9am - 5pm.  She reports this appeared approx 3 weeks ago; she reports initially was not bothering her but has been increasingly tender and uncomfortable. Reports home health has been applying emuaid.   She also has erythematous rash underneath abdominal folds; has been applying corn starch.    Past Medical History:  Diagnosis Date  . COPD (chronic obstructive pulmonary disease) ([place])   . GERD (gastroesophageal reflux disease)   . Hyperlipidemia   . Hypertension   . Morbid obesity ([place]) 08/01/2014  . Osteoporosis   . Prediabetes   . Vitamin D deficiency      Allergies  Allergen Reactions  . Aspirin Swelling  . Celebrex [Celecoxib] Other (See Comments)    "my body lit up"  . Fosamax [Alendronate Sodium] Other (See Comments)    unknown  . Keflex [Cephalexin] Other (See Comments)    unknown  . Penicillins Other (See Comments)    unknown  . Prednisone Itching  . Prevacid [Lansoprazole] Other (See Comments)    unknown  . Sulfa Antibiotics Other (See Comments)    unknown  . Tetanus Toxoids Other (See Comments)    unknown  . Tetracyclines & Related Nausea Only    Nausea     Current Outpatient Medications on File Prior to Visit  Medication Sig  . acetaminophen (TYLENOL) 325  MG tablet Take 2 tablets (650 mg total) by mouth every 6 (six) hours as needed (prn TEMP>38.1 Celsius).  . busPIRone (BUSPAR) 10 MG tablet TAKE 1 TABLET 3 TIMES A DAY FOR CHRONIC ANXIETY  . calcium-vitamin D (OSCAL WITH D) 250-125 MG-UNIT per tablet Take 1 tablet by mouth daily.  .  Cholecalciferol (VITAMIN D3) 2000 UNITS TABS Take 4 tablets by mouth daily.   . citalopram (CELEXA) 20 MG tablet Take     1 tablet     Daily     For Mood      TAKE 1 TABLET BY MOUTH DAILY FOR MOOD  . dexamethasone (DECADRON) 4 MG tablet Take 1 tab 3 x /day for 2 days,      then 2 x /day for 2  Days,     then 1 tab daily  . fexofenadine (ALLEGRA) 180 MG tablet Take 180 mg by mouth daily.   . hydrochlorothiazide (HYDRODIURIL) 25 MG tablet TAKE 1 TABLET DAILY FOR BP & FLUID  . levothyroxine (SYNTHROID) 50 MCG tablet TAKE 1 TABLET DAILY ON AN EMPTY STOMACH WITH ONLY WATER FOR 30 MINUTES & NO ANTACID MEDS, CALCIUM OR MAGNESIUM FOR 4 HOURS & AVOID BIOTIN  . magnesium oxide (MAG-OX) 400 (241.3 MG) MG tablet Take 1 tablet (400 mg total) by mouth 2 (two) times daily. (Patient taking differently: Take 400 mg by mouth 2 (two) times daily. Takes one tablet daily)  . Multiple Vitamin (MULITIVITAMIN WITH MINERALS) TABS Take 1 tablet by mouth daily.  [name] Kitchen omeprazole (PRILOSEC) 40 MG capsule TAKE 1 CAPSULE BY MOUTH EVERY DAY  . Probiotic Product (PROBIOTIC FORMULA PO) Take 1 tablet by mouth daily.   . promethazine-dextromethorphan (PROMETHAZINE-DM) 6.25-15 MG/5ML syrup Take 1 to 2 tsp every 4 hours if needed for cough  . terazosin (HYTRIN) 10 MG capsule TAKE 1 TABLET AT BEDTIME FOR BP   No current facility-administered medications on file prior to visit.    ROS: all negative except above.   Physical Exam:  BP 120/78   Pulse (!) 102   Temp (!) 97.5 F (36.4 C)   SpO2 92%   General Appearance: Elder in wheelchair, fair hygiene, in no apparent distress. Eyes: PERRLA, EOMs, conjunctiva no swelling or erythema ENT/Mouth: Mask in place; Hearing normal.  Neck: Supple Respiratory: Respiratory effort normal, BS equal bilaterally without rales, rhonchi, wheezing or stridor.  Cardio: RRR with no MRGs. Brisk peripheral pulses without edema.  Abdomen: Soft, obese, pendulous; + BS.  Non tender, no guarding, rebound,  hernias, masses. Lymphatics: Non tender without lymphadenopathy.  Musculoskeletal: Generalized decrease in muscle power, tone and bulk. Gait is unstable with high fall risk and patient is totally wheelchair dependent requiring 4 (+) max assistance for transfers. Kyphosis.  Skin: Generalized skin is warm, thin, fragile; buttocks are erythematous but not hot to touch; blanches throughout; R medial buttock with mild skin tear/ shearing injury near gluteal cleft with open area x 2 approx 1-1.5 cm each, without odor, purulent discharge.  She has bil erythetous rash Neuro:  Normal muscle tone, Sensation intact.  Psych: Awake and oriented X 3, normal affect, Insight and Judgment is fair    Izora Ribas, NP 3:32 PM [facility]

## 2021-01-15 ENCOUNTER — Ambulatory Visit: Payer: Medicare Other | Admitting: Adult Health Nurse Practitioner

## 2021-01-16 ENCOUNTER — Telehealth: Payer: Self-pay | Admitting: *Deleted

## 2021-01-16 NOTE — Telephone Encounter (Signed)
Per Daughter, patient is having bleeding in her diaper, but daughter cannot determine which area it is coming from. Blood shows up in her urine. Symptoms started today,but no other symptoms. Dose she need urgent appointment?

## 2021-01-16 NOTE — Telephone Encounter (Signed)
Spoke with daughter. Someone will pick up a urine cup for the patient to send in a urine sample.

## 2021-01-21 ENCOUNTER — Ambulatory Visit: Payer: Medicare Other | Admitting: Podiatry

## 2021-01-21 ENCOUNTER — Encounter: Payer: Self-pay | Admitting: Podiatry

## 2021-01-21 ENCOUNTER — Other Ambulatory Visit: Payer: Self-pay

## 2021-01-21 DIAGNOSIS — M79675 Pain in left toe(s): Secondary | ICD-10-CM | POA: Diagnosis not present

## 2021-01-21 DIAGNOSIS — M2142 Flat foot [pes planus] (acquired), left foot: Secondary | ICD-10-CM

## 2021-01-21 DIAGNOSIS — M2141 Flat foot [pes planus] (acquired), right foot: Secondary | ICD-10-CM

## 2021-01-21 DIAGNOSIS — B351 Tinea unguium: Secondary | ICD-10-CM | POA: Diagnosis not present

## 2021-01-21 DIAGNOSIS — M79674 Pain in right toe(s): Secondary | ICD-10-CM

## 2021-01-26 NOTE — Progress Notes (Signed)
  Subjective:  Patient ID: Alyssa Schmitt, female    DOB: 21-Jul-1928,  MRN: [id number]  Alyssa Schmitt presents to clinic today for painful thick toenails that are difficult to trim. Pain interferes with ambulation. Aggravating factors include wearing enclosed shoe gear. Pain is relieved with periodic professional debridement..  Her neighbor is present during today's visit.  Allergies  Allergen Reactions  . Alendronate Sodium     Other reaction(s): Other (See Comments) unknown  . Aspirin Swelling  . Celebrex [Celecoxib] Other (See Comments)    "my body lit up"  . Fosamax [Alendronate Sodium] Other (See Comments)    unknown  . Keflex [Cephalexin] Other (See Comments)    unknown  . Other     Other reaction(s): Other (See Comments) unknown  . Penicillins Other (See Comments)    unknown  . Prednisone Itching  . Prevacid [Lansoprazole] Other (See Comments)    unknown  . Sulfa Antibiotics Other (See Comments)    unknown Other reaction(s): Other (See Comments) unknown  . Tetanus Toxoids Other (See Comments)    unknown  . Tetracycline Hcl Nausea And Vomiting    Nausea  . Tetracyclines & Related Nausea Only    Nausea    Objective:   Constitutional Alyssa Schmitt is a pleasant 87 y.o. Caucasian female, WD, WN in NAD.[name] Kitchen AAO x 3.   Vascular Neurovascular status unchanged b/l lower extremities. Capillary refill time to digits immediate b/l. Palpable pedal pulses b/l LE. Pedal hair sparse. Lower extremity skin temperature gradient within normal limits. Varicosities present b/l. Evidence of chronic venous insufficiency b/l LE.  No cyanosis or clubbing noted.  Neurologic Normal speech. Oriented to person, place, and time. Protective sensation intact 5/5 intact bilaterally with 10g monofilament b/l. Vibratory sensation intact b/l.  Dermatologic Pedal skin with normal turgor, texture and tone bilaterally. No open wounds bilaterally. No interdigital macerations bilaterally. Toenails 1-5  b/l elongated, discolored, dystrophic, thickened, crumbly with subungual debris and tenderness to dorsal palpation. Incurvated nailplate medial border(s) R 3rd toe.  Nail border hypertrophy minimal. There is tenderness to palpation. Sign(s) of infection: no clinical signs of infection noted on examination today..  Orthopedic: Normal muscle strength 5/5 to all lower extremity muscle groups bilaterally. No pain crepitus or joint limitation noted with ROM b/l. Pes planus deformity noted b/l.  Utilizes transport chair for ambulation assistance.   Radiographs: None Assessment:   1. Pain due to onychomycosis of toenails of both feet   2. Pes planus of both feet    Plan:  Patient was evaluated and treated and all questions answered.  Onychomycosis with pain -Nails palliatively debridement as below -Educated on self-care  Procedure: Nail Debridement Rationale: Pain Type of Debridement: manual, sharp debridement. Instrumentation: Nail nipper, rotary burr. Number of Nails: 10 -Examined patient. -Patient to continue soft, supportive shoe gear daily. -Toenails 1-5 b/l were debrided in length and girth with sterile nail nippers and dremel without iatrogenic bleeding.  -Patient to report any pedal injuries to medical professional immediately. -Patient/POA to call should there be question/concern in the interim.  Return in about 3 months (around 04/20/2021).  Alyssa Schmitt, DPM

## 2021-01-27 NOTE — Progress Notes (Deleted)
Assessment and Plan:  Diagnoses and all orders for this visit:  Tear of skin of right buttock, initial encounter Superficial tear/break in skin with questionable possible pressure injury though currently appears skin is blanching throughout; doesn't appear to be active infection Very limited mobility, sedentary; discussed importance of displacing weight to avoid progression to pressure injury She may use donut pillow over area of break down in seated position for ~2 hours if needed; also position supine with weight shifted laterally; use pillows between knees and ankles, may utilize wedge pillow to help; change position every 1-2 hours Clear area with warm soapy water 1-2 times daily, then apply Calmoseptine or other barrier cream to area prior to pull ups Get on zinc and vitamin C supplements, increase protein intake Monitor progress daily for healing progress Follow up in office 2 weeks or sooner if worsening or concerning changes such as foul odor, purulent discharge, no longer blanching Alyssa Schmitt expresses understanding and denies further questions at this time  Intertriginous candidiasis Discussed keeping abdominal folds clean, dry; after shower dry thoroughly; apply clean towels between folds if needed; apply nystatin 1-2 times daily as needed until resolved -     nystatin (MYCOSTATIN/NYSTOP) powder; Apply daily after a shower as needed  Further disposition pending results of labs. Discussed med's effects and SE's.   Over 30 minutes of exam, counseling, chart review, and critical decision making was performed.   Future Appointments  Date Time Provider [place]  01/28/2021 11:30 AM [name], NP [facility] None  03/03/2021  2:00 PM Garnet Sierras, NP [facility] None  05/06/2021  3:30 PM Marzetta Board, DPM [facility]  12/03/2021  3:30 PM McClanahan, Danton Sewer, NP [facility] None     ------------------------------------------------------------------------------------------------------------------   HPI There were no vitals taken for this visit.  87 y.o.female with hx of pressure injury of buttock presents for evaluation of a wound to her bottom; Patient is notably very sedentary, limited mobility without significant assistance, wears pull ups.  Her daughter Alyssa Schmitt who usually accompanies is in the hospital; family friend Alyssa Schmitt is with her today. Reports has home health coming in daily to help care for her 9am - 5pm.  She reports this appeared approx 3 weeks ago; she reports initially was not bothering her but has been increasingly tender and uncomfortable. Reports home health has been applying emuaid.   She also has erythematous rash underneath abdominal folds; has been applying corn starch.    Past Medical History:  Diagnosis Date  . COPD (chronic obstructive pulmonary disease) ([place])   . GERD (gastroesophageal reflux disease)   . Hyperlipidemia   . Hypertension   . Morbid obesity ([place]) 08/01/2014  . Osteoporosis   . Prediabetes   . Vitamin D deficiency      Allergies  Allergen Reactions  . Alendronate Sodium     Other reaction(s): Other (See Comments) unknown  . Aspirin Swelling  . Celebrex [Celecoxib] Other (See Comments)    "my body lit up"  . Fosamax [Alendronate Sodium] Other (See Comments)    unknown  . Keflex [Cephalexin] Other (See Comments)    unknown  . Other     Other reaction(s): Other (See Comments) unknown  . Penicillins Other (See Comments)    unknown  . Prednisone Itching  . Prevacid [Lansoprazole] Other (See Comments)    unknown  . Sulfa Antibiotics Other (See Comments)    unknown Other reaction(s): Other (See Comments) unknown  . Tetanus Toxoids Other (See Comments)    unknown  .  Tetracycline Hcl Nausea And Vomiting    Nausea  . Tetracyclines & Related Nausea Only    Nausea     Current Outpatient Medications on File  Prior to Visit  Medication Sig  . acetaminophen (TYLENOL) 325 MG tablet Take 2 tablets (650 mg total) by mouth every 6 (six) hours as needed (prn TEMP>38.1 Celsius).  . busPIRone (BUSPAR) 10 MG tablet TAKE 1 TABLET 3 TIMES A DAY FOR CHRONIC ANXIETY  . calcium-vitamin D (OSCAL WITH D) 250-125 MG-UNIT per tablet Take 1 tablet by mouth daily.  . Cholecalciferol (VITAMIN D3) 2000 UNITS TABS Take 4 tablets by mouth daily.   . citalopram (CELEXA) 20 MG tablet Take     1 tablet     Daily     For Mood      TAKE 1 TABLET BY MOUTH DAILY FOR MOOD  . fexofenadine (ALLEGRA) 180 MG tablet Take 180 mg by mouth daily.   . hydrochlorothiazide (HYDRODIURIL) 25 MG tablet TAKE 1 TABLET DAILY FOR BP & FLUID  . levothyroxine (SYNTHROID) 50 MCG tablet TAKE 1 TABLET DAILY ON AN EMPTY STOMACH WITH ONLY WATER FOR 30 MINUTES & NO ANTACID MEDS, CALCIUM OR MAGNESIUM FOR 4 HOURS & AVOID BIOTIN  . magnesium oxide (MAG-OX) 400 (241.3 MG) MG tablet Take 1 tablet (400 mg total) by mouth 2 (two) times daily. (Patient taking differently: Take 400 mg by mouth 2 (two) times daily. Takes one tablet daily)  . Multiple Vitamin (MULITIVITAMIN WITH MINERALS) TABS Take 1 tablet by mouth daily.  [name] Kitchen nystatin (MYCOSTATIN/NYSTOP) powder Apply daily after a shower as needed  . omeprazole (PRILOSEC) 40 MG capsule TAKE 1 CAPSULE BY MOUTH EVERY DAY  . Probiotic Product (PROBIOTIC FORMULA PO) Take 1 tablet by mouth daily.   [name] Kitchen terazosin (HYTRIN) 10 MG capsule TAKE 1 TABLET AT BEDTIME FOR BP   No current facility-administered medications on file prior to visit.    ROS: all negative except above.   Physical Exam:  There were no vitals taken for this visit.  General Appearance: Elder in wheelchair, fair hygiene, in no apparent distress. Eyes: PERRLA, EOMs, conjunctiva no swelling or erythema ENT/Mouth: Mask in place; Hearing normal.  Neck: Supple Respiratory: Respiratory effort normal, BS equal bilaterally without rales, rhonchi, wheezing or  stridor.  Cardio: RRR with no MRGs. Brisk peripheral pulses without edema.  Abdomen: Soft, obese, pendulous; + BS.  Non tender, no guarding, rebound, hernias, masses. Lymphatics: Non tender without lymphadenopathy.  Musculoskeletal: Generalized decrease in muscle power, tone and bulk. Gait is unstable with high fall risk and patient is totally wheelchair dependent requiring 4 (+) max assistance for transfers. Kyphosis.  Skin: Generalized skin is warm, thin, fragile; buttocks are erythematous but not hot to touch; blanches throughout; R medial buttock with mild skin tear/ shearing injury near gluteal cleft with open area x 2 approx 1-1.5 cm each, without odor, purulent discharge.  She has bil erythetous rash Neuro:  Normal muscle tone, Sensation intact.  Psych: Awake and oriented X 3, normal affect, Insight and Judgment is fair    Izora Ribas, NP 1:53 PM [facility]

## 2021-01-28 ENCOUNTER — Ambulatory Visit: Payer: Medicare Other | Admitting: Adult Health

## 2021-02-03 NOTE — Progress Notes (Deleted)
Assessment and Plan:  Diagnoses and all orders for this visit:  Tear of skin of right buttock, initial encounter Superficial tear/break in skin with questionable possible pressure injury though currently appears skin is blanching throughout; doesn't appear to be active infection Very limited mobility, sedentary; discussed importance of displacing weight to avoid progression to pressure injury She may use donut pillow over area of break down in seated position for ~2 hours if needed; also position supine with weight shifted laterally; use pillows between knees and ankles, may utilize wedge pillow to help; change position every 1-2 hours Clear area with warm soapy water 1-2 times daily, then apply Calmoseptine or other barrier cream to area prior to pull ups Get on zinc and vitamin C supplements, increase protein intake Monitor progress daily for healing progress Follow up in office 2 weeks or sooner if worsening or concerning changes such as foul odor, purulent discharge, no longer blanching Jeani Hawking expresses understanding and denies further questions at this time  Intertriginous candidiasis Discussed keeping abdominal folds clean, dry; after shower dry thoroughly; apply clean towels between folds if needed; apply nystatin 1-2 times daily as needed until resolved -     nystatin (MYCOSTATIN/NYSTOP) powder; Apply daily after a shower as needed  Further disposition pending results of labs. Discussed med's effects and SE's.   Over 30 minutes of exam, counseling, chart review, and critical decision making was performed.   Future Appointments  Date Time Provider [place]  02/05/2021  1:45 PM [name], NP [facility] None  03/03/2021  2:00 PM Garnet Sierras, NP [facility] None  05/06/2021  3:30 PM Marzetta Board, DPM [facility]  12/03/2021  3:30 PM McClanahan, Danton Sewer, NP [facility] None     ------------------------------------------------------------------------------------------------------------------   HPI There were no vitals taken for this visit.  87 y.o.female with hx of pressure injury of buttock presents for evaluation of a wound to her bottom; Patient is notably very sedentary, limited mobility without significant assistance, wears pull ups.  Her daughter [name] who usually accompanies is in the hospital; family friend Jeani Hawking is with her today. Reports has home health coming in daily to help care for her 9am - 5pm.  She reports this appeared approx 3 weeks ago; she reports initially was not bothering her but has been increasingly tender and uncomfortable. Reports home health has been applying emuaid.   She also has erythematous rash underneath abdominal folds; has been applying corn starch.    Past Medical History:  Diagnosis Date  . COPD (chronic obstructive pulmonary disease) ([place])   . GERD (gastroesophageal reflux disease)   . Hyperlipidemia   . Hypertension   . Morbid obesity ([place]) 08/01/2014  . Osteoporosis   . Prediabetes   . Vitamin D deficiency      Allergies  Allergen Reactions  . Alendronate Sodium     Other reaction(s): Other (See Comments) unknown  . Aspirin Swelling  . Celebrex [Celecoxib] Other (See Comments)    "my body lit up"  . Fosamax [Alendronate Sodium] Other (See Comments)    unknown  . Keflex [Cephalexin] Other (See Comments)    unknown  . Other     Other reaction(s): Other (See Comments) unknown  . Penicillins Other (See Comments)    unknown  . Prednisone Itching  . Prevacid [Lansoprazole] Other (See Comments)    unknown  . Sulfa Antibiotics Other (See Comments)    unknown Other reaction(s): Other (See Comments) unknown  . Tetanus Toxoids Other (See Comments)    unknown  .  Tetracycline Hcl Nausea And Vomiting    Nausea  . Tetracyclines & Related Nausea Only    Nausea     Current Outpatient Medications on File  Prior to Visit  Medication Sig  . acetaminophen (TYLENOL) 325 MG tablet Take 2 tablets (650 mg total) by mouth every 6 (six) hours as needed (prn TEMP>38.1 Celsius).  . busPIRone (BUSPAR) 10 MG tablet TAKE 1 TABLET 3 TIMES A DAY FOR CHRONIC ANXIETY  . calcium-vitamin D (OSCAL WITH D) 250-125 MG-UNIT per tablet Take 1 tablet by mouth daily.  . Cholecalciferol (VITAMIN D3) 2000 UNITS TABS Take 4 tablets by mouth daily.   . citalopram (CELEXA) 20 MG tablet Take     1 tablet     Daily     For Mood      TAKE 1 TABLET BY MOUTH DAILY FOR MOOD  . fexofenadine (ALLEGRA) 180 MG tablet Take 180 mg by mouth daily.   . hydrochlorothiazide (HYDRODIURIL) 25 MG tablet TAKE 1 TABLET DAILY FOR BP & FLUID  . levothyroxine (SYNTHROID) 50 MCG tablet TAKE 1 TABLET DAILY ON AN EMPTY STOMACH WITH ONLY WATER FOR 30 MINUTES & NO ANTACID MEDS, CALCIUM OR MAGNESIUM FOR 4 HOURS & AVOID BIOTIN  . magnesium oxide (MAG-OX) 400 (241.3 MG) MG tablet Take 1 tablet (400 mg total) by mouth 2 (two) times daily. (Patient taking differently: Take 400 mg by mouth 2 (two) times daily. Takes one tablet daily)  . Multiple Vitamin (MULITIVITAMIN WITH MINERALS) TABS Take 1 tablet by mouth daily.  [name] Kitchen nystatin (MYCOSTATIN/NYSTOP) powder Apply daily after a shower as needed  . omeprazole (PRILOSEC) 40 MG capsule TAKE 1 CAPSULE BY MOUTH EVERY DAY  . Probiotic Product (PROBIOTIC FORMULA PO) Take 1 tablet by mouth daily.   [name] Kitchen terazosin (HYTRIN) 10 MG capsule TAKE 1 TABLET AT BEDTIME FOR BP   No current facility-administered medications on file prior to visit.    ROS: all negative except above.   Physical Exam:  There were no vitals taken for this visit.  General Appearance: Elder in wheelchair, fair hygiene, in no apparent distress. Eyes: PERRLA, EOMs, conjunctiva no swelling or erythema ENT/Mouth: Mask in place; Hearing normal.  Neck: Supple Respiratory: Respiratory effort normal, BS equal bilaterally without rales, rhonchi, wheezing or  stridor.  Cardio: RRR with no MRGs. Brisk peripheral pulses without edema.  Abdomen: Soft, obese, pendulous; + BS.  Non tender, no guarding, rebound, hernias, masses. Lymphatics: Non tender without lymphadenopathy.  Musculoskeletal: Generalized decrease in muscle power, tone and bulk. Gait is unstable with high fall risk and patient is totally wheelchair dependent requiring 4 (+) max assistance for transfers. Kyphosis.  Skin: Generalized skin is warm, thin, fragile; buttocks are erythematous but not hot to touch; blanches throughout; R medial buttock with mild skin tear/ shearing injury near gluteal cleft with open area x 2 approx 1-1.5 cm each, without odor, purulent discharge.  She has bil erythetous rash Neuro:  Normal muscle tone, Sensation intact.  Psych: Awake and oriented X 3, normal affect, Insight and Judgment is fair    Izora Ribas, NP 5:45 PM [facility]

## 2021-02-05 ENCOUNTER — Ambulatory Visit: Payer: Medicare Other | Admitting: Adult Health

## 2021-02-05 DIAGNOSIS — S31811A Laceration without foreign body of right buttock, initial encounter: Secondary | ICD-10-CM

## 2021-02-05 DIAGNOSIS — N8110 Cystocele, unspecified: Secondary | ICD-10-CM

## 2021-02-24 ENCOUNTER — Other Ambulatory Visit: Payer: Self-pay | Admitting: Adult Health

## 2021-02-24 ENCOUNTER — Encounter: Payer: Medicare Other | Admitting: Adult Health Nurse Practitioner

## 2021-03-02 NOTE — Progress Notes (Deleted)
COMPLETE PHYSICAL  Assessment:   Encounter for routine medical examination with abnormal findings Yearly  Essential hypertension -     Continue hctz 25 mg daily, terazosin 10 mg daily -     CBC with Differential/Platelet -     CMP/GFR -     Magnesium  Chronic bronchitis, unspecified chronic bronchitis type ([place])       -     Stable; patient reports she is able to complete ADLs without difficulty  Gastroesophageal reflux disease, esophagitis presence not specified -     Magnesium -     Continue H2blocker/PPI  Hypothyroidism -     Continue levothyroxine 50 mcg daily -     TSH -     Lipid panel  Osteoporosis, unspecified osteoporosis type, unspecified pathological fracture presence -     VITAMIN D 25 Hydroxy (Vit-D Deficiency, Fractures), calcium supplementation -     Has not tolerated medications -     Encouraged to continue with home exercises/weights -     Consider PT as needed for maintenance of strength and stability  Vitamin D deficiency -     VITAMIN D 25 Hydroxy (Vit-D Deficiency, Fractures)  Obesity (BMI 30-39.9)      -      Patient is working on home exercises; BMI trending down slowly. Discussed diet, encouraged reduced sugar/white flour products, push vegetables, lean proteins, whole grains.   Depression, major, in remission ([place])      -      Currently well managed with celexa 20 mg daily, buspar 10 mg TID PRN anxiety   Hypertensive retinopathy of both eyes      -      Continue close monitoring of BPs; continue annual follow up with Dr. Payton [facility] office  Varicose veins of bilateral lower extremities with other complications        -      Continue with support hose daily  Abnormal glucose Recent A1Cs at goal Discussed diet/exercise, weight management  Defer A1C; check CMP   Medication management Continued   Further disposition pending results if labs check today. Discussed med's effects and SE's.   Over 30 minutes of face to face interview, exam,  counseling, chart review, and critical decision making was performed.    Future Appointments  Date Time Provider [place]  03/03/2021  2:00 PM Garnet Sierras, NP [facility] None  05/06/2021  3:30 PM Marzetta Board, DPM [facility]  12/03/2021  3:30 PM [name], NP [facility] None  03/03/2022  2:00 PM Garnet Sierras, NP [facility] None     Plan:   During the course of the visit the patient was educated and counseled about appropriate screening and preventive services including:    Pneumococcal vaccine   Prevnar 13  Influenza vaccine  Td vaccine  Screening electrocardiogram  Bone densitometry screening  Colorectal cancer screening  Diabetes screening  Glaucoma screening  Nutrition counseling   Advanced directives: requested   Subjective:  Alyssa Schmitt is a 87 y.o. very pleasant [place] female who presents for Medicare Annual Wellness Visit and 3 month follow up on chronic medical conditions.   Her last OV was 01/13/21 for tear of skin of right buttock. She has used pillow, donut to avoid pressure.  Has used barrier cream as well.  They report today***  She denies any concerns today, reports she has been very well these past few months, and has been working hard on a home exercise routine that she can  do from her chair and with her walker.   She is driven by her daughter whom she lives with after her husband passed away. She also has a caregiver that comes from 9-5 to help her with her ADL's.  She was last seen in our office on 10/01/20 for right lower extremity calf pain.  She has been seen by [facility] for thick toenails & debridement.  she has a diagnosis of GERD which is currently managed by omeprazole 40 mg  she reports symptoms is currently well controlled, and denies breakthrough reflux, burning in chest, hoarseness or cough.    BMI is There is no height or weight on file to calculate BMI., she has been  working on diet and exercise (does chair exercises and walks in home with walker).  Wt Readings from Last 3 Encounters:  08/19/20 180 lb (81.6 kg)  07/29/20 183 lb (83 kg)  09/25/19 183 lb (83 kg)   Her blood pressure has been controlled at home, today their BP is    She does workout. She denies chest pain, shortness of breath, dizziness.   She is not on cholesterol medication and denies myalgias. Her cholesterol is at goal. The cholesterol last visit was:   Lab Results  Component Value Date   CHOL 192 02/06/2020   HDL 55 02/06/2020   LDLCALC 118 (H) 02/06/2020   TRIG 87 02/06/2020   CHOLHDL 3.5 02/06/2020    She has been working on diet and exercise for glucose management, and denies increased appetite, nausea, paresthesia of the feet, polydipsia and polyuria. Last A1C in the office was:  Lab Results  Component Value Date   HGBA1C 5.4 08/19/2020   She is on thyroid medication. Her medication was not changed last visit.   Lab Results  Component Value Date   TSH 3.03 12/03/2020   Patient is on Vitamin D supplement and at goal:  Lab Results  Component Value Date   VD25OH 111 (H) 08/19/2020      Medication Review: Current Outpatient Medications on File Prior to Visit  Medication Sig Dispense Refill  . acetaminophen (TYLENOL) 325 MG tablet Take 2 tablets (650 mg total) by mouth every 6 (six) hours as needed (prn TEMP>38.1 Celsius). 30 tablet 2  . busPIRone (BUSPAR) 10 MG tablet TAKE 1 TABLET 3 TIMES A DAY FOR CHRONIC ANXIETY 270 tablet 3  . calcium-vitamin D (OSCAL WITH D) 250-125 MG-UNIT per tablet Take 1 tablet by mouth daily.    . Cholecalciferol (VITAMIN D3) 2000 UNITS TABS Take 4 tablets by mouth daily.     . citalopram (CELEXA) 20 MG tablet Take     1 tablet     Daily     For Mood      TAKE 1 TABLET BY MOUTH DAILY FOR MOOD 90 tablet 0  . fexofenadine (ALLEGRA) 180 MG tablet Take 180 mg by mouth daily.     . hydrochlorothiazide (HYDRODIURIL) 25 MG tablet TAKE 1 TABLET  DAILY FOR BP & FLUID 90 tablet 3  . levothyroxine (SYNTHROID) 50 MCG tablet TAKE 1 TABLET DAILY ON AN EMPTY STOMACH WITH ONLY WATER FOR 30 MINUTES & NO ANTACID MEDS, CALCIUM OR MAGNESIUM FOR 4 HOURS & AVOID BIOTIN 90 tablet 3  . magnesium oxide (MAG-OX) 400 (241.3 MG) MG tablet Take 1 tablet (400 mg total) by mouth 2 (two) times daily. (Patient taking differently: Take 400 mg by mouth 2 (two) times daily. Takes one tablet daily) 60 tablet 2  .  Multiple Vitamin (MULITIVITAMIN WITH MINERALS) TABS Take 1 tablet by mouth daily.    [name] Kitchen nystatin (MYCOSTATIN/NYSTOP) powder Apply daily after a shower as needed 60 g 2  . omeprazole (PRILOSEC) 40 MG capsule TAKE 1 CAPSULE BY MOUTH EVERY DAY 90 capsule 4  . Probiotic Product (PROBIOTIC FORMULA PO) Take 1 tablet by mouth daily.     [name] Kitchen terazosin (HYTRIN) 10 MG capsule TAKE 1 TABLET AT BEDTIME FOR BP 90 capsule 3   No current facility-administered medications on file prior to visit.    Allergies  Allergen Reactions  . Alendronate Sodium     Other reaction(s): Other (See Comments) unknown  . Aspirin Swelling  . Celebrex [Celecoxib] Other (See Comments)    "my body lit up"  . Fosamax [Alendronate Sodium] Other (See Comments)    unknown  . Keflex [Cephalexin] Other (See Comments)    unknown  . Other     Other reaction(s): Other (See Comments) unknown  . Penicillins Other (See Comments)    unknown  . Prednisone Itching  . Prevacid [Lansoprazole] Other (See Comments)    unknown  . Sulfa Antibiotics Other (See Comments)    unknown Other reaction(s): Other (See Comments) unknown  . Tetanus Toxoids Other (See Comments)    unknown  . Tetracycline Hcl Nausea And Vomiting    Nausea  . Tetracyclines & Related Nausea Only    Nausea     Current Problems (verified) Patient Active Problem List   Diagnosis Date Noted  . Environmental allergies 09/26/2020  . Combined form of age-related cataract, left eye 09/10/2020  . Acquired female bladder prolapse  03/06/2020  . Pressure injury of buttock, stage 1 03/06/2020  . Tear of skin of right buttock 10/05/2018  . Intertriginous candidiasis 10/05/2018  . Elevated LFTs 08/26/2018  . Hypertensive retinopathy of both eyes 09/21/2017  . Depression, major, in remission ([place]) 06/22/2016  . COPD 08/15/2015  . GERD  08/15/2015  . Osteoporosis 07/16/2015  . Hypothyroidism   . Medication management 03/20/2014  . Essential hypertension   . Hyperlipidemia, mixed   . Abnormal glucose   . Vitamin D deficiency   . Varicose veins of bilateral lower extremities with other complications 97/74/1423    Screening Tests Immunization History  Administered Date(s) Administered  . Influenza, High Dose Seasonal PF 09/05/2014, 09/17/2015, 08/25/2016, 09/22/2017, 08/25/2018, 09/25/2019, 09/26/2020  . PFIZER(Purple Top)SARS-COV-2 Vaccination 01/19/2020  . Pneumococcal Conjugate-13 11/20/2015  . Pneumococcal Polysaccharide-23 12/18/2007  . Zoster 07/25/2013   Preventative care: Last colonoscopy: 2001 will not get another Last mammogram:09/2019 Last pap smear/pelvic exam: Remote with Dr. Philis Pique   DEXA: 2016 T -3.1 Intolerant of medications  Echo: 2013  Prior vaccinations:  TD or Tdap: ALLERGY  Influenza: 09/26/2020 Pneumococcal: 2009 Prevnar 13: 2016 Shingles/Zostavax: 2014   JAN 13, she is having catact surgery.   Names of Other Physician/Practitioners you currently use: 1. [facility] here for primary care 2. Dr. Marshall Cork, eye doctor, last visit early this year 2020 3. Wears full dentures, last dentist several years ago, denies issues  Patient Care Team: Unk Pinto, MD as PCP - General (Internal Medicine) Nahser, Wonda Cheng, MD (Cardiology) Unk Pinto, MD as Attending Physician (Internal Medicine) Kelby Aline, PA-C as Physician Assistant (Physician Assistant) Monna Fam, MD as Consulting Physician (Ophthalmology) Mal Misty, MD (Inactive) as Consulting Physician (Vascular Surgery) Despina Hick, MD as Referring Physician (Radiology) Garald Balding, MD as Consulting Physician (Orthopedic Surgery) Clarene Essex, MD as Consulting  Physician (Gastroenterology)  SURGICAL HISTORY She  has a past surgical history that includes Tonsillectomy and Vesicovaginal fistula closure w/ TAH. FAMILY HISTORY Her family history includes Diabetes in her father; Heart disease in her mother; Other in her brother and mother. SOCIAL HISTORY She  reports that she has never smoked. She has never used smokeless tobacco. She reports that she does not drink alcohol and does not use drugs.  Review of Systems  Constitutional: Negative.  Negative for malaise/fatigue.  HENT: Negative for congestion, hearing loss and sore throat.   Eyes: Negative for blurred vision.  Respiratory: Negative for cough, sputum production, shortness of breath and wheezing.   Cardiovascular: Negative for chest pain, palpitations, orthopnea, claudication and leg swelling.  Gastrointestinal: Negative for abdominal pain, blood in stool, constipation, diarrhea, heartburn, melena, nausea and vomiting.  Genitourinary: Negative.  Negative for frequency.  Musculoskeletal: Negative for falls, joint pain and myalgias.  Skin: Negative.   Neurological: Negative for dizziness, tingling, tremors, sensory change, focal weakness, weakness and headaches.  Endo/Heme/Allergies: Negative.  Negative for polydipsia. Does not bruise/bleed easily.  Psychiatric/Behavioral: Negative.  Negative for depression, memory loss and suicidal ideas.     Objective:     There were no vitals filed for this visit. There is no height or weight on file to calculate BMI. General Appearance: Well nourished, in no apparent distress. Eyes: PERRLA, EOMs, conjunctiva no swelling or erythema Sinuses: No Frontal/maxillary tenderness ENT/Mouth: Ext aud canals clear, TMs without erythema, bulging. No  erythema, swelling, or exudate on post pharynx.  Tonsils not swollen or erythematous. Hearing normal.  Neck: Supple, thyroid normal.  Respiratory: Respiratory effort normal, BS equal bilaterally without rales, rhonchi, wheezing or stridor.  Cardio: RRR with no MRGs. Brisk peripheral pulses without edema.  Abdomen: Soft, + BS.  Non tender, no guarding, rebound, hernias, masses. Lymphatics: Non tender without lymphadenopathy.  Musculoskeletal: Full ROM, 5/5 strength, Normal gait Skin: Warm, dry without rashes, lesions, ecchymosis.  Neuro: Cranial nerves intact. No cerebellar symptoms.  Psych: Awake and oriented X 3, normal affect, Insight and Judgment appropriate.     Garnet Sierras, Laqueta Jean, DNP [facility] 03/02/2021  9:34 PM

## 2021-03-03 ENCOUNTER — Encounter: Payer: Medicare Other | Admitting: Adult Health Nurse Practitioner

## 2021-03-03 DIAGNOSIS — I83893 Varicose veins of bilateral lower extremities with other complications: Secondary | ICD-10-CM

## 2021-03-03 DIAGNOSIS — Z1389 Encounter for screening for other disorder: Secondary | ICD-10-CM

## 2021-03-03 DIAGNOSIS — I10 Essential (primary) hypertension: Secondary | ICD-10-CM

## 2021-03-03 DIAGNOSIS — H35033 Hypertensive retinopathy, bilateral: Secondary | ICD-10-CM

## 2021-03-03 DIAGNOSIS — E559 Vitamin D deficiency, unspecified: Secondary | ICD-10-CM

## 2021-03-03 DIAGNOSIS — Z79899 Other long term (current) drug therapy: Secondary | ICD-10-CM

## 2021-03-03 DIAGNOSIS — E038 Other specified hypothyroidism: Secondary | ICD-10-CM

## 2021-03-03 DIAGNOSIS — F3342 Major depressive disorder, recurrent, in full remission: Secondary | ICD-10-CM

## 2021-03-03 DIAGNOSIS — J42 Unspecified chronic bronchitis: Secondary | ICD-10-CM

## 2021-03-03 DIAGNOSIS — R7309 Other abnormal glucose: Secondary | ICD-10-CM

## 2021-03-03 DIAGNOSIS — K219 Gastro-esophageal reflux disease without esophagitis: Secondary | ICD-10-CM

## 2021-03-03 DIAGNOSIS — Z0001 Encounter for general adult medical examination with abnormal findings: Secondary | ICD-10-CM

## 2021-03-03 DIAGNOSIS — M810 Age-related osteoporosis without current pathological fracture: Secondary | ICD-10-CM

## 2021-03-06 ENCOUNTER — Other Ambulatory Visit: Payer: Self-pay

## 2021-03-06 ENCOUNTER — Other Ambulatory Visit: Payer: Medicare Other

## 2021-03-06 DIAGNOSIS — R8290 Unspecified abnormal findings in urine: Secondary | ICD-10-CM

## 2021-03-07 ENCOUNTER — Other Ambulatory Visit: Payer: Self-pay | Admitting: Adult Health

## 2021-03-07 MED ORDER — NITROFURANTOIN MONOHYD MACRO 100 MG PO CAPS: 100.0000 mg | ORAL_CAPSULE | Freq: Two times a day (BID) | ORAL | 0 refills | Status: DC

## 2021-03-08 LAB — URINALYSIS, ROUTINE W REFLEX MICROSCOPIC
Bilirubin Urine: NEGATIVE
Glucose, UA: NEGATIVE
Hyaline Cast: >60 /LPF — AB
Ketones, ur: NEGATIVE
Nitrite: POSITIVE — AB
Specific Gravity, Urine: 1.016 (ref 1.001–1.03)
WBC, UA: >=60 /HPF — AB (ref 0–5)
pH: 8.5 — ABNORMAL HIGH (ref 5.0–8.0)

## 2021-03-08 LAB — URINE CULTURE
MICRO NUMBER:: 11717077
SPECIMEN QUALITY:: ADEQUATE

## 2021-03-08 LAB — MICROSCOPIC MESSAGE

## 2021-04-10 DIAGNOSIS — I447 Left bundle-branch block, unspecified: Secondary | ICD-10-CM | POA: Diagnosis not present

## 2021-04-10 DIAGNOSIS — R600 Localized edema: Secondary | ICD-10-CM | POA: Diagnosis not present

## 2021-04-10 DIAGNOSIS — H2511 Age-related nuclear cataract, right eye: Secondary | ICD-10-CM | POA: Diagnosis not present

## 2021-04-10 DIAGNOSIS — I440 Atrioventricular block, first degree: Secondary | ICD-10-CM | POA: Diagnosis not present

## 2021-04-10 DIAGNOSIS — H25811 Combined forms of age-related cataract, right eye: Secondary | ICD-10-CM | POA: Diagnosis not present

## 2021-04-10 DIAGNOSIS — K219 Gastro-esophageal reflux disease without esophagitis: Secondary | ICD-10-CM | POA: Diagnosis not present

## 2021-04-10 DIAGNOSIS — R011 Cardiac murmur, unspecified: Secondary | ICD-10-CM | POA: Diagnosis not present

## 2021-04-10 DIAGNOSIS — R0602 Shortness of breath: Secondary | ICD-10-CM | POA: Diagnosis not present

## 2021-04-10 DIAGNOSIS — R918 Other nonspecific abnormal finding of lung field: Secondary | ICD-10-CM | POA: Diagnosis not present

## 2021-04-10 DIAGNOSIS — H21541 Posterior synechiae (iris), right eye: Secondary | ICD-10-CM | POA: Diagnosis not present

## 2021-04-10 DIAGNOSIS — H5703 Miosis: Secondary | ICD-10-CM | POA: Diagnosis not present

## 2021-04-10 DIAGNOSIS — M40209 Unspecified kyphosis, site unspecified: Secondary | ICD-10-CM | POA: Diagnosis not present

## 2021-04-10 DIAGNOSIS — Z20822 Contact with and (suspected) exposure to covid-19: Secondary | ICD-10-CM | POA: Diagnosis not present

## 2021-04-10 NOTE — Progress Notes (Signed)
Alyssa Schmitt 87 year old female [place] in [place]  Acute respiratory failure with hypoxia.  Med Center is affiliated with [facility] however patient is from [place] and receives care in [place] family and patient are requesting transfer here.  Patient was not advanced to have a cataract surgery performed.  Noted to have hypoxia postop.  Also noted to have a murmur postop.  Had an echo performed showing moderate aortic stenosis, mild tricuspid regurgitation, normal EF, normal IVC.  Chest x-ray showing atelectasis plus minus pneumonia.  Saturating well on 2 L however dropped into the 80s on room air.  Has been discussed with cardiology here who are happy to see the patient when arise but do not believe this is cardiac in nature.  Lab work-up can be accessed in care everywhere and shows CMP within normal limits, CBC within normal limits other than platelets of 140.  Accepted to telemetry bed.

## 2021-04-11 ENCOUNTER — Inpatient Hospital Stay (HOSPITAL_COMMUNITY): Payer: Medicare Other

## 2021-04-11 ENCOUNTER — Inpatient Hospital Stay (HOSPITAL_COMMUNITY)
Admission: AD | Admit: 2021-04-11 | Discharge: 2021-04-16 | DRG: 291 | Disposition: A | Discharge status: Skilled Nursing Facility | Payer: Medicare Other | Source: Other Acute Inpatient Hospital | Attending: Internal Medicine | Admitting: Internal Medicine

## 2021-04-11 DIAGNOSIS — J449 Chronic obstructive pulmonary disease, unspecified: Secondary | ICD-10-CM | POA: Diagnosis present

## 2021-04-11 DIAGNOSIS — E559 Vitamin D deficiency, unspecified: Secondary | ICD-10-CM | POA: Diagnosis not present

## 2021-04-11 DIAGNOSIS — Z886 Allergy status to analgesic agent status: Secondary | ICD-10-CM

## 2021-04-11 DIAGNOSIS — H269 Unspecified cataract: Secondary | ICD-10-CM | POA: Diagnosis present

## 2021-04-11 DIAGNOSIS — I959 Hypotension, unspecified: Secondary | ICD-10-CM | POA: Diagnosis not present

## 2021-04-11 DIAGNOSIS — R279 Unspecified lack of coordination: Secondary | ICD-10-CM | POA: Diagnosis not present

## 2021-04-11 DIAGNOSIS — Z6835 Body mass index (BMI) 35.0-35.9, adult: Secondary | ICD-10-CM

## 2021-04-11 DIAGNOSIS — R9431 Abnormal electrocardiogram [ECG] [EKG]: Secondary | ICD-10-CM | POA: Diagnosis present

## 2021-04-11 DIAGNOSIS — E039 Hypothyroidism, unspecified: Secondary | ICD-10-CM | POA: Diagnosis present

## 2021-04-11 DIAGNOSIS — Z888 Allergy status to other drugs, medicaments and biological substances status: Secondary | ICD-10-CM | POA: Diagnosis not present

## 2021-04-11 DIAGNOSIS — Z882 Allergy status to sulfonamides status: Secondary | ICD-10-CM | POA: Diagnosis not present

## 2021-04-11 DIAGNOSIS — Z20822 Contact with and (suspected) exposure to covid-19: Secondary | ICD-10-CM | POA: Diagnosis present

## 2021-04-11 DIAGNOSIS — L8990 Pressure ulcer of unspecified site, unspecified stage: Secondary | ICD-10-CM | POA: Insufficient documentation

## 2021-04-11 DIAGNOSIS — I5033 Acute on chronic diastolic (congestive) heart failure: Secondary | ICD-10-CM | POA: Diagnosis not present

## 2021-04-11 DIAGNOSIS — J9811 Atelectasis: Secondary | ICD-10-CM | POA: Diagnosis not present

## 2021-04-11 DIAGNOSIS — E785 Hyperlipidemia, unspecified: Secondary | ICD-10-CM | POA: Diagnosis not present

## 2021-04-11 DIAGNOSIS — N8110 Cystocele, unspecified: Secondary | ICD-10-CM | POA: Diagnosis present

## 2021-04-11 DIAGNOSIS — F419 Anxiety disorder, unspecified: Secondary | ICD-10-CM | POA: Diagnosis present

## 2021-04-11 DIAGNOSIS — R498 Other voice and resonance disorders: Secondary | ICD-10-CM | POA: Diagnosis not present

## 2021-04-11 DIAGNOSIS — I350 Nonrheumatic aortic (valve) stenosis: Secondary | ICD-10-CM | POA: Diagnosis not present

## 2021-04-11 DIAGNOSIS — Z90710 Acquired absence of both cervix and uterus: Secondary | ICD-10-CM

## 2021-04-11 DIAGNOSIS — K449 Diaphragmatic hernia without obstruction or gangrene: Secondary | ICD-10-CM | POA: Diagnosis not present

## 2021-04-11 DIAGNOSIS — R1312 Dysphagia, oropharyngeal phase: Secondary | ICD-10-CM | POA: Diagnosis not present

## 2021-04-11 DIAGNOSIS — J811 Chronic pulmonary edema: Secondary | ICD-10-CM | POA: Diagnosis not present

## 2021-04-11 DIAGNOSIS — Z8249 Family history of ischemic heart disease and other diseases of the circulatory system: Secondary | ICD-10-CM

## 2021-04-11 DIAGNOSIS — M6281 Muscle weakness (generalized): Secondary | ICD-10-CM | POA: Diagnosis not present

## 2021-04-11 DIAGNOSIS — Z833 Family history of diabetes mellitus: Secondary | ICD-10-CM | POA: Diagnosis not present

## 2021-04-11 DIAGNOSIS — M810 Age-related osteoporosis without current pathological fracture: Secondary | ICD-10-CM | POA: Diagnosis present

## 2021-04-11 DIAGNOSIS — R0902 Hypoxemia: Secondary | ICD-10-CM | POA: Diagnosis not present

## 2021-04-11 DIAGNOSIS — Z66 Do not resuscitate: Secondary | ICD-10-CM | POA: Diagnosis not present

## 2021-04-11 DIAGNOSIS — I110 Hypertensive heart disease with heart failure: Principal | ICD-10-CM | POA: Diagnosis present

## 2021-04-11 DIAGNOSIS — K219 Gastro-esophageal reflux disease without esophagitis: Secondary | ICD-10-CM | POA: Diagnosis present

## 2021-04-11 DIAGNOSIS — I447 Left bundle-branch block, unspecified: Secondary | ICD-10-CM | POA: Diagnosis not present

## 2021-04-11 DIAGNOSIS — Z881 Allergy status to other antibiotic agents status: Secondary | ICD-10-CM | POA: Diagnosis not present

## 2021-04-11 DIAGNOSIS — Z880 Allergy status to penicillin: Secondary | ICD-10-CM | POA: Diagnosis not present

## 2021-04-11 DIAGNOSIS — J9601 Acute respiratory failure with hypoxia: Secondary | ICD-10-CM | POA: Diagnosis present

## 2021-04-11 DIAGNOSIS — Z79890 Hormone replacement therapy (postmenopausal): Secondary | ICD-10-CM

## 2021-04-11 DIAGNOSIS — I440 Atrioventricular block, first degree: Secondary | ICD-10-CM | POA: Diagnosis not present

## 2021-04-11 DIAGNOSIS — R2689 Other abnormalities of gait and mobility: Secondary | ICD-10-CM | POA: Diagnosis not present

## 2021-04-11 DIAGNOSIS — J9690 Respiratory failure, unspecified, unspecified whether with hypoxia or hypercapnia: Secondary | ICD-10-CM | POA: Diagnosis not present

## 2021-04-11 DIAGNOSIS — L89301 Pressure ulcer of unspecified buttock, stage 1: Secondary | ICD-10-CM | POA: Diagnosis present

## 2021-04-11 DIAGNOSIS — R2681 Unsteadiness on feet: Secondary | ICD-10-CM | POA: Diagnosis not present

## 2021-04-11 DIAGNOSIS — Z79899 Other long term (current) drug therapy: Secondary | ICD-10-CM | POA: Diagnosis not present

## 2021-04-11 DIAGNOSIS — I517 Cardiomegaly: Secondary | ICD-10-CM | POA: Diagnosis not present

## 2021-04-11 DIAGNOSIS — Z743 Need for continuous supervision: Secondary | ICD-10-CM | POA: Diagnosis not present

## 2021-04-11 LAB — PROCALCITONIN: Procalcitonin: <0.1 ng/mL

## 2021-04-11 LAB — SARS CORONAVIRUS 2 (TAT 6-24 HRS): SARS Coronavirus 2: NEGATIVE

## 2021-04-11 LAB — COMPREHENSIVE METABOLIC PANEL
ALT: 14 U/L (ref 0–44)
AST: 26 U/L (ref 15–41)
Albumin: 3.1 g/dL — ABNORMAL LOW (ref 3.5–5.0)
Alkaline Phosphatase: 38 U/L (ref 38–126)
Anion gap: 10 (ref 5–15)
BUN: 9 mg/dL (ref 8–23)
CO2: 30 mmol/L (ref 22–32)
Calcium: 9.1 mg/dL (ref 8.9–10.3)
Chloride: 96 mmol/L — ABNORMAL LOW (ref 98–111)
Creatinine, Ser: 0.69 mg/dL (ref 0.44–1.00)
GFR, Estimated: >60 mL/min (ref >60)
Glucose, Bld: 89 mg/dL (ref 70–99)
Potassium: 2.9 mmol/L — ABNORMAL LOW (ref 3.5–5.1)
Sodium: 136 mmol/L (ref 135–145)
Total Bilirubin: 1.2 mg/dL (ref 0.3–1.2)
Total Protein: 6.2 g/dL — ABNORMAL LOW (ref 6.5–8.1)

## 2021-04-11 LAB — CBC
HCT: 39.2 % (ref 36.0–46.0)
Hemoglobin: 13.2 g/dL (ref 12.0–15.0)
MCH: 30.4 pg (ref 26.0–34.0)
MCHC: 33.7 g/dL (ref 30.0–36.0)
MCV: 90.3 fL (ref 80.0–100.0)
Platelets: 156 10*3/uL (ref 150–400)
RBC: 4.34 MIL/uL (ref 3.87–5.11)
RDW: 13.3 % (ref 11.5–15.5)
WBC: 7.4 10*3/uL (ref 4.0–10.5)
nRBC: 0 % (ref 0.0–0.2)

## 2021-04-11 LAB — PHOSPHORUS: Phosphorus: 2.8 mg/dL (ref 2.5–4.6)

## 2021-04-11 LAB — MAGNESIUM: Magnesium: 1.5 mg/dL — ABNORMAL LOW (ref 1.7–2.4)

## 2021-04-11 MED ORDER — CITALOPRAM HYDROBROMIDE 20 MG PO TABS
20.0000 mg | ORAL_TABLET | Freq: Every day | ORAL | Status: DC
  Administered 2021-04-11 – 2021-04-14 (×4): 20 mg via ORAL
  Filled 2021-04-11 (×4): qty 1

## 2021-04-11 MED ORDER — PANTOPRAZOLE SODIUM 40 MG PO TBEC
40.0000 mg | DELAYED_RELEASE_TABLET | Freq: Every day | ORAL | Status: DC
  Administered 2021-04-11 – 2021-04-16 (×6): 40 mg via ORAL
  Filled 2021-04-11 (×6): qty 1

## 2021-04-11 MED ORDER — PROCHLORPERAZINE EDISYLATE 10 MG/2ML IJ SOLN
10.0000 mg | Freq: Four times a day (QID) | INTRAMUSCULAR | Status: DC | PRN
  Filled 2021-04-11: qty 2

## 2021-04-11 MED ORDER — POTASSIUM CHLORIDE CRYS ER 20 MEQ PO TBCR
40.0000 meq | EXTENDED_RELEASE_TABLET | Freq: Two times a day (BID) | ORAL | Status: AC
  Administered 2021-04-11 – 2021-04-13 (×6): 40 meq via ORAL
  Filled 2021-04-11 (×6): qty 2

## 2021-04-11 MED ORDER — GATIFLOXACIN 0.5 % OP SOLN
1.0000 [drp] | Freq: Four times a day (QID) | OPHTHALMIC | Status: DC
  Administered 2021-04-11 – 2021-04-16 (×21): 1 [drp] via OPHTHALMIC
  Filled 2021-04-11: qty 2.5

## 2021-04-11 MED ORDER — ONDANSETRON HCL 4 MG/2ML IJ SOLN: 4.0000 mg | Freq: Four times a day (QID) | INTRAMUSCULAR | Status: DC | PRN

## 2021-04-11 MED ORDER — PREDNISOLONE ACETATE 1 % OP SUSP
1.0000 [drp] | Freq: Four times a day (QID) | OPHTHALMIC | Status: DC
  Administered 2021-04-11 – 2021-04-16 (×21): 1 [drp] via OPHTHALMIC
  Filled 2021-04-11: qty 5

## 2021-04-11 MED ORDER — SODIUM CHLORIDE 0.45 % IV SOLN: INTRAVENOUS | Status: AC

## 2021-04-11 MED ORDER — MELATONIN 3 MG PO TABS: 3.0000 mg | ORAL_TABLET | Freq: Every evening | ORAL | Status: DC | PRN

## 2021-04-11 MED ORDER — LEVOTHYROXINE SODIUM 50 MCG PO TABS
50.0000 ug | ORAL_TABLET | Freq: Every day | ORAL | Status: DC
  Administered 2021-04-11 – 2021-04-16 (×6): 50 ug via ORAL
  Filled 2021-04-11 (×6): qty 1

## 2021-04-11 MED ORDER — SODIUM CHLORIDE 0.9 % IV SOLN
100.0000 mg | Freq: Two times a day (BID) | INTRAVENOUS | Status: DC
  Administered 2021-04-11: 100 mg via INTRAVENOUS
  Filled 2021-04-11 (×3): qty 100

## 2021-04-11 MED ORDER — MAGNESIUM SULFATE 2 GM/50ML IV SOLN
2.0000 g | Freq: Once | INTRAVENOUS | Status: AC
  Administered 2021-04-11: 2 g via INTRAVENOUS
  Filled 2021-04-11: qty 50

## 2021-04-11 MED ORDER — RISAQUAD PO CAPS
1.0000 | ORAL_CAPSULE | Freq: Every day | ORAL | Status: DC
  Administered 2021-04-11 – 2021-04-16 (×6): 1 via ORAL
  Filled 2021-04-11 (×6): qty 1

## 2021-04-11 MED ORDER — ENOXAPARIN SODIUM 40 MG/0.4ML IJ SOSY
40.0000 mg | PREFILLED_SYRINGE | Freq: Every day | INTRAMUSCULAR | Status: DC
  Administered 2021-04-11 – 2021-04-16 (×6): 40 mg via SUBCUTANEOUS
  Filled 2021-04-11 (×6): qty 0.4

## 2021-04-11 MED ORDER — ACETAMINOPHEN 325 MG PO TABS
650.0000 mg | ORAL_TABLET | Freq: Four times a day (QID) | ORAL | Status: DC | PRN
  Administered 2021-04-11 – 2021-04-14 (×5): 650 mg via ORAL
  Filled 2021-04-11 (×5): qty 2

## 2021-04-11 NOTE — Consult Note (Signed)
WOC Nurse Consult Note: Patient receiving care in [place].  Daughter, [name], at bedside. Reason for Consult: "wound care" --clarification Secure Chat with Dr. Fransico Meadow the concern is on the sacrum and buttocks Wound type: No actual open wound found. However, the patient sits in a lift chair "all the time at home".  And she is incontinent and always wears an incontinent brief--she had one on today brought in from home.  What the patient has is what is know as Chronic Tissue Injury (CTI) from the constant sitting.  The entire buttocks is discolored purple, as is common with CTI.  [name] tells me she has her mother sit on a donut. I explained the trapping of blood flow and fluids that can occur with such devices, and strongly encouraged her to avoid using the product when the patient returns home.  The only "solution" to CTI is relieving the pressure by turning and repositioning. Pressure Injury POA: Yes/No/NA Measurement: Wound bed: Drainage (amount, consistency, odor)  Periwound: Dressing procedure/placement/frequency: I have entered an order and spoke with the primary RN explaining the necessity to assist the patient with turning. [place] nurse will not follow at this time.  Please re-consult the [facility] team if needed.  Val Riles, RN, MSN, CWOCN, CNS-BC, pager [phone]

## 2021-04-11 NOTE — Evaluation (Addendum)
Occupational Therapy Evaluation Patient Details Name: Alyssa Schmitt MRN: [id number] DOB: [birth year] Today's Date: 04/11/2021    History of Present Illness 87 yo female presenting 5/6 s/p cataract surgery with acute hypoxia and hypotension. PMH includes: HTN, hypothyroidism, COPD, and obesity.   Clinical Impression   PTA, pt was living with her daughter who assisted with BADLs and supervising functional mobility; also has an aide who assists with ADLs and mobility. Pt currently presenting with decreased balance, strength, and activity tolerance compared to baseline. Pt requiring Mod A for UB ADLs, Max A for LB ADLs, and Max A +2 and RW for mobility. Pt presenting with posterior lean in standing. Discussing that pt will need physical A for dc to home. Daughter reports she will increased physical A. Pt would benefit from further acute OT to facilitate safe dc. Recommend dc to home with HHOT for further OT to optimize safety, independence with ADLs, and return to PLOF. '    Follow Up Recommendations  Home health OT;Supervision/Assistance - 24 hour (Will require post-acute rehab if unable to gain increased physical A at home)    Equipment Recommendations  Wheelchair (measurements OT) (hoyer lift)    Recommendations for Other Services PT consult     Precautions / Restrictions Precautions Precautions: Fall Precaution Comments: fall, watch O2 Restrictions Weight Bearing Restrictions: No      Mobility Bed Mobility Overal bed mobility: Needs Assistance Bed Mobility: Supine to Sit     Supine to sit: Max assist;+2 for physical assistance     General bed mobility comments: pt able to initiate movement of BLE to EOB, but then reports significant pain in back, required maxA and use of bed pad to scoot hips to EOB and assist posteriorly to maintain static sitting    Transfers Overall transfer level: Needs assistance Equipment used: Rolling walker (2 wheeled) Transfers: Sit to/from  [facility] Sit to Stand: +2 physical assistance;Mod assist;From elevated surface Stand pivot transfers: Max assist;+2 physical assistance       General transfer comment: modA of 2 to power up from elevated surface, significant assist to hips and to correct strong posterior lean. significant trunk flexion, increased time and effort. maxA to complete pivot to recliner, assist to RW, assist to steady and manage wt shift for stepping    Balance Overall balance assessment: Needs assistance Sitting-balance support: Single extremity supported;Feet supported Sitting balance-Leahy Scale: Poor Sitting balance - Comments: modA to minG with static sitting. significant kyphosis and posterior lean without assist Postural control: Posterior lean Standing balance support: Bilateral upper extremity supported Standing balance-Leahy Scale: Poor Standing balance comment: maxA with BUE support                           ADL either performed or assessed with clinical judgement   ADL Overall ADL's : Needs assistance/impaired Eating/Feeding: Set up;Supervision/ safety;Sitting   Grooming: Set up;Supervision/safety;Sitting   Upper Body Bathing: Moderate assistance;Sitting   Lower Body Bathing: Maximal assistance;Sit to/from stand   Upper Body Dressing : Moderate assistance;Sitting   Lower Body Dressing: Maximal assistance;Sit to/from stand Lower Body Dressing Details (indicate cue type and reason): don socks Toilet Transfer: Maximal assistance;+2 for physical assistance;Stand-pivot;RW           Functional mobility during ADLs: Maximal assistance;+2 for physical assistance (stand pivot to recliner) General ADL Comments: Pt presenting with decreased balance, strength, and activity tolerance due to pain at BLEs, back, and neck  Vision Baseline Vision/History: Cataracts Patient Visual Report: Other (comment) (s/p cataract sx and pt with R eye patch)       Perception      Praxis      Pertinent Vitals/Pain Pain Assessment: Faces Faces Pain Scale: Hurts even more Pain Location: back Pain Descriptors / Indicators: Discomfort;Sore Pain Intervention(s): Monitored during session;Limited activity within patient's tolerance;Repositioned     Hand Dominance     Extremity/Trunk Assessment Upper Extremity Assessment Upper Extremity Assessment: Generalized weakness   Lower Extremity Assessment Lower Extremity Assessment: Defer to PT evaluation   Cervical / Trunk Assessment Cervical / Trunk Assessment: Kyphotic   Communication Communication Communication: No difficulties   Cognition Arousal/Alertness: Awake/alert Behavior During Therapy: WFL for tasks assessed/performed Overall Cognitive Status: Impaired/Different from baseline Area of Impairment: Memory;Safety/judgement;Problem solving                     Memory: Decreased short-term memory   Safety/Judgement: Decreased awareness of safety   Problem Solving: Slow processing;Requires verbal cues;Requires tactile cues;Difficulty sequencing General Comments: pt benefits from simple cues with increased time to follow.  able to voice concerns and answer simple questions through session   General Comments  Daughter present throughout. SpO2 95% on 2L; dropping to 89% on RA at rest so maintained 2L throughout. BP stable: supine 122/62 (79), sitting at EOB 123/69 (85), and after transfer 127/65 (84)    Exercises     Shoulder Instructions      Home Living Family/patient expects to be discharged to:: Private residence Living Arrangements: Children Available Help at Discharge: Family;Personal care attendant;Available 24 hours/day (daughter and CNA) Type of Home: House       Home Layout: Able to live on main level with bedroom/bathroom               Home Equipment: Gilford Rile - 2 wheels;Bedside commode;Wheelchair - manual (Lift chair)   Additional Comments: Pt sleeps in lift chair       Prior Functioning/Environment Level of Independence: Needs assistance  Gait / Transfers Assistance Needed: at least supervision with use of RW, intermittent assist to steady. walking shorter and shorter distances in the home. ADL's / Homemaking Assistance Needed: assist for ADLs from CNA or daughter            OT Problem List: Decreased strength;Decreased range of motion;Decreased activity tolerance;Impaired balance (sitting and/or standing);Decreased knowledge of use of DME or AE;Decreased knowledge of precautions      OT Treatment/Interventions: Self-care/ADL training;Therapeutic exercise;Energy conservation;DME and/or AE instruction;Therapeutic activities;Patient/family education    OT Goals(Current goals can be found in the care plan section) Acute Rehab OT Goals Patient Stated Goal: daughter: return home OT Goal Formulation: With patient/family Time For Goal Achievement: 04/25/21 Potential to Achieve Goals: Good  OT Frequency: Min 2X/week   Barriers to D/C:            Co-evaluation PT/OT/SLP Co-Evaluation/Treatment: Yes Reason for Co-Treatment: For patient/therapist safety;To address functional/ADL transfers PT goals addressed during session: Mobility/safety with mobility;Balance;Proper use of DME;Strengthening/ROM OT goals addressed during session: ADL's and self-care      AM-PAC OT "6 Clicks" Daily Activity     Outcome Measure Help from another person eating meals?: A Little Help from another person taking care of personal grooming?: A Little Help from another person toileting, which includes using toliet, bedpan, or urinal?: A Lot Help from another person bathing (including washing, rinsing, drying)?: A Lot Help from another person to put on and taking off regular upper  body clothing?: A Lot Help from another person to put on and taking off regular lower body clothing?: A Lot 6 Click Score: 14   End of Session Equipment Utilized During Treatment: Gait belt;Rolling  walker Nurse Communication: Mobility status  Activity Tolerance: Patient tolerated treatment well;Patient limited by pain Patient left: in chair;with call bell/phone within reach;with chair alarm set;with family/visitor present  OT Visit Diagnosis: Unsteadiness on feet (R26.81);Other abnormalities of gait and mobility (R26.89);Muscle weakness (generalized) (M62.81)                Time: 5366-4403 OT Time Calculation (min): 33 min Charges:  OT General Charges $OT Visit: 1 Visit OT Evaluation $OT Eval Moderate Complexity: [place], OTR/L Acute Rehab Pager: [phone] Office: [place] 04/11/2021, 4:32 PM

## 2021-04-11 NOTE — Plan of Care (Signed)
Family at bedside, all needs addressed.   Problem: Education: Goal: Knowledge of General Education information will improve Description: Including pain rating scale, medication(s)/side effects and non-pharmacologic comfort measures Outcome: Progressing   Problem: Clinical Measurements: Goal: Will remain free from infection Outcome: Progressing   Problem: Activity: Goal: Risk for activity intolerance will decrease Outcome: Progressing   Problem: Coping: Goal: Level of anxiety will decrease Outcome: Progressing

## 2021-04-11 NOTE — Progress Notes (Deleted)
Pharmacy Antibiotic Note  Alyssa Schmitt is a 87 y.o. female admitted on 04/11/2021 with hypoxia and hypotension, possible PNA.  Pharmacy has been consulted for Levaquin dosing.  SCr 0.72 yesterday at outside facility  Plan: Levaquin 750 mg IV q48h   Temp (24hrs), Avg:98.6 F (37 C), Min:98.6 F (37 C), Max:98.6 F (37 C)  No results for input(s): WBC, CREATININE, LATICACIDVEN, VANCOTROUGH, VANCOPEAK, VANCORANDOM, GENTTROUGH, GENTPEAK, GENTRANDOM, TOBRATROUGH, TOBRAPEAK, TOBRARND, AMIKACINPEAK, AMIKACINTROU, AMIKACIN in the last 168 hours.  CrCl cannot be calculated (Patient's most recent lab result is older than the maximum 21 days allowed.).    Allergies  Allergen Reactions  . Alendronate Sodium     Other reaction(s): Other (See Comments) unknown  . Aspirin Swelling  . Celebrex [Celecoxib] Other (See Comments)    "my body lit up"  . Fosamax [Alendronate Sodium] Other (See Comments)    unknown  . Keflex [Cephalexin] Other (See Comments)    unknown  . Other     Other reaction(s): Other (See Comments) unknown  . Penicillins Other (See Comments)    unknown  . Prednisone Itching  . Prevacid [Lansoprazole] Other (See Comments)    unknown  . Sulfa Antibiotics Other (See Comments)    unknown Other reaction(s): Other (See Comments) unknown  . Tetanus Toxoids Other (See Comments)    unknown  . Tetracycline Hcl Nausea And Vomiting    Nausea  . Tetracyclines & Related Nausea Only    Nausea     Caryl Pina 04/11/2021 1:37 AM

## 2021-04-11 NOTE — Progress Notes (Signed)
Patient ID: Alyssa Schmitt, female   DOB: Jan 02, 1928, 87 y.o.   MRN: [id number]   Assessment/Plan: Active Problems:   Acute respiratory failure with hypoxia (HCC)   Pressure injury of skin  Admitted with some mild hypoxia overnight. Feels well today-- Likely related to sedation from cataract procedure yesterday. Likely can go home tomorrow.  Cards consult called according to admission H and P, but they have not seen her yet today. Wound care did not think she needed further eval PT/OT think HH is necessary--has aide already  Subjective: Interval History: feels ok today  Objective: Vital signs in last 24 hours: Temp:  [98 F (36.7 C)-99.4 F (37.4 C)] 99.4 F (37.4 C) (05/06 1641) Pulse Rate:  [75-81] 75 (05/06 1641) Resp:  [15-17] 17 (05/06 1641) BP: (118-122)/(64-85) 118/66 (05/06 1641) SpO2:  [95 %-97 %] 97 % (05/06 1641)  Intake/Output from previous day: 05/05 0701 - 05/06 0700 In: 623.8 [P.O.:120; I.V.:253.8] Out: -  Intake/Output this shift: Total I/O In: 237 [P.O.:237] Out: -   BP 118/66 (BP Location: Right Arm)   Pulse 75   Temp 99.4 F (37.4 C) (Oral)   Resp 17   SpO2 97%  General appearance: alert, cooperative and appears stated age Neck: supple, symmetrical, trachea midline Lungs: normal effort, clear bilaterally Heart: regular rate and rhythm Abdomen: soft, non-tender; bowel sounds normal; no masses,  no organomegaly Extremities: extremities normal, atraumatic, no cyanosis or edema  Results for orders placed or performed during the hospital encounter of 04/11/21 (from the past 24 hour(s))  SARS CORONAVIRUS 2 (TAT 6-24 HRS) Nasopharyngeal Nasopharyngeal Swab     Status: None   Collection Time: 04/11/21  1:08 AM   Specimen: Nasopharyngeal Swab  Result Value Ref Range   SARS Coronavirus 2 NEGATIVE NEGATIVE  CBC     Status: None   Collection Time: 04/11/21  2:17 AM  Result Value Ref Range   WBC 7.4 4.0 - 10.5 K/uL   RBC 4.34 3.87 - 5.11 MIL/uL    Hemoglobin 13.2 12.0 - 15.0 g/dL   HCT 39.2 36.0 - 46.0 %   MCV 90.3 80.0 - 100.0 fL   MCH 30.4 26.0 - 34.0 pg   MCHC 33.7 30.0 - 36.0 g/dL   RDW 13.3 11.5 - 15.5 %   Platelets 156 150 - 400 K/uL   nRBC 0.0 0.0 - 0.2 %  Magnesium     Status: Abnormal   Collection Time: 04/11/21  2:17 AM  Result Value Ref Range   Magnesium 1.5 (L) 1.7 - 2.4 mg/dL  Phosphorus     Status: None   Collection Time: 04/11/21  2:17 AM  Result Value Ref Range   Phosphorus 2.8 2.5 - 4.6 mg/dL  Comprehensive metabolic panel     Status: Abnormal   Collection Time: 04/11/21  2:17 AM  Result Value Ref Range   Sodium 136 135 - 145 mmol/L   Potassium 2.9 (L) 3.5 - 5.1 mmol/L   Chloride 96 (L) 98 - 111 mmol/L   CO2 30 22 - 32 mmol/L   Glucose, Bld 89 70 - 99 mg/dL   BUN 9 8 - 23 mg/dL   Creatinine, Ser 0.69 0.44 - 1.00 mg/dL   Calcium 9.1 8.9 - 10.3 mg/dL   Total Protein 6.2 (L) 6.5 - 8.1 g/dL   Albumin 3.1 (L) 3.5 - 5.0 g/dL   AST 26 15 - 41 U/L   ALT 14 0 - 44 U/L   Alkaline Phosphatase 38 38 -  126 U/L   Total Bilirubin 1.2 0.3 - 1.2 mg/dL   GFR, Estimated >60 >60 mL/min   Anion gap 10 5 - 15  Procalcitonin - Baseline     Status: None   Collection Time: 04/11/21  2:17 AM  Result Value Ref Range   Procalcitonin <0.10 ng/mL    Studies/Results: DG CHEST PORT 1 VIEW  Result Date: 04/11/2021 CLINICAL DATA:  Acute respiratory failure with hypoxia EXAM: PORTABLE CHEST 1 VIEW COMPARISON:  06/22/2018 FINDINGS: Cardiomegaly. Large hiatal hernia was also seen in 2019. Low volume chest. Hazy density behind the heart and streaky density at the right base. Trace pleural fluid is possible. No pneumothorax. IMPRESSION: 1. Limited low volume chest. 2. Retrocardiac opacification that could be atelectasis or infection. 3. Large hiatal hernia. Electronically Signed   By: Monte Fantasia M.D.   On: 04/11/2021 04:11    Scheduled Meds: . acidophilus  1 capsule Oral Daily  . citalopram  20 mg Oral Daily  . enoxaparin  (LOVENOX) injection  40 mg Subcutaneous Daily  . gatifloxacin  1 drop Right Eye QID  . levothyroxine  50 mcg Oral Q0600  . pantoprazole  40 mg Oral Daily  . potassium chloride  40 mEq Oral BID  . prednisoLONE acetate  1 drop Right Eye QID   Continuous Infusions: . sodium chloride 50 mL/hr at 04/11/21 0642   PRN Meds:acetaminophen, melatonin, prochlorperazine    LOS: 0 days   Donnamae Jude, MD 04/11/2021 5:11 PM

## 2021-04-11 NOTE — H&P (Addendum)
History and Physical  Alyssa Schmitt ZOX:[id number] DOB: 03/06/1928 DOA: 04/11/2021  Referring physician: Admitted by Dr. Trilby Drummer, [facility]. PCP: Unk Pinto, MD  Outpatient Specialists: Ophthalmology Patient coming from: Direct transfer from [facility] in [place], affiliated with [facility].  Chief Complaint:  Acute hypoxia  HPI: Alyssa Schmitt is a 87 y.o. female with medical history significant for essential hypertension, hypothyroidism, cataract, chronic anxiety who presented initially at outside facility sent from perioperative services due to acute hypoxia and low blood pressure post op.  Per medical records, patient showed up for a cataract procedure.  Procedure was performed and afterward the patient was found to be hypoxic and hypotensive.  She had received a small dose of opiates.  The procedure was completed without complication.  She was also noted to have a murmur.  No reported chest pain no reported dyspnea.  She was in her usual state of health prior to her procedure.  She was advised to go to the ED for further evaluation.  She went to outside facility ED noted above.  From there the patient and family requested transfer to [facility] since the patient is from [place] and receives care in [place].  Patient was accepted by Dr. Trilby Drummer as a direct admit.  Case was discussed with cardiology by accepting physician and will see in consultation.  At the time of this visit patient denies chest pain, palpitations, or dyspnea.  Only complaint is of having generalized fatigue and lower back pain after laying in the bed in outside facility ED.  ED Course:  Direct transfer from [facility] in [place].  Review of Systems: Review of systems as noted in the HPI. All other systems reviewed and are negative.   Past Medical History:  Diagnosis Date  . COPD (chronic obstructive pulmonary disease) ([place])   . GERD (gastroesophageal reflux  disease)   . Hyperlipidemia   . Hypertension   . Morbid obesity ([place]) 08/01/2014  . Osteoporosis   . Prediabetes   . Vitamin D deficiency    Past Surgical History:  Procedure Laterality Date  . TONSILLECTOMY     childhood  . VESICOVAGINAL FISTULA CLOSURE W/ TAH      Social History:  reports that she has never smoked. She has never used smokeless tobacco. She reports that she does not drink alcohol and does not use drugs.   Allergies  Allergen Reactions  . Alendronate Sodium     Other reaction(s): Other (See Comments) unknown  . Aspirin Swelling  . Celebrex [Celecoxib] Other (See Comments)    "my body lit up"  . Fosamax [Alendronate Sodium] Other (See Comments)    unknown  . Keflex [Cephalexin] Other (See Comments)    unknown  . Other     Other reaction(s): Other (See Comments) unknown  . Penicillins Other (See Comments)    unknown  . Prednisone Itching  . Prevacid [Lansoprazole] Other (See Comments)    unknown  . Sulfa Antibiotics Other (See Comments)    unknown Other reaction(s): Other (See Comments) unknown  . Tetanus Toxoids Other (See Comments)    unknown  . Tetracycline Hcl Nausea And Vomiting    Nausea  . Tetracyclines & Related Nausea Only    Nausea     Family History  Problem Relation Age of Onset  . Heart disease Mother   . Other Mother        VARICOSE VEINS  . Diabetes Father   .  Other Brother        VARICOSE VEINS      Prior to Admission medications   Medication Sig Start Date End Date Taking? Authorizing Provider  acetaminophen (TYLENOL) 325 MG tablet Take 2 tablets (650 mg total) by mouth every 6 (six) hours as needed (prn TEMP>38.1 Celsius). 12/21/14   Reyne Dumas, MD  busPIRone (BUSPAR) 10 MG tablet TAKE 1 TABLET 3 TIMES A DAY FOR CHRONIC ANXIETY 08/14/20   Unk Pinto, MD  calcium-vitamin D (OSCAL WITH D) 250-125 MG-UNIT per tablet Take 1 tablet by mouth daily.    [provider]  Cholecalciferol (VITAMIN D3) 2000 UNITS  TABS Take 4 tablets by mouth daily.     [provider]  citalopram (CELEXA) 20 MG tablet Take     1 tablet     Daily     For Mood      TAKE 1 TABLET BY MOUTH DAILY FOR MOOD 11/23/20   Unk Pinto, MD  fexofenadine (ALLEGRA) 180 MG tablet Take 180 mg by mouth daily.     [provider]  hydrochlorothiazide (HYDRODIURIL) 25 MG tablet TAKE 1 TABLET DAILY FOR BP & FLUID 05/17/20   Unk Pinto, MD  levothyroxine (SYNTHROID) 50 MCG tablet TAKE 1 TABLET DAILY ON AN EMPTY STOMACH WITH ONLY WATER FOR 30 MINUTES & NO ANTACID MEDS, CALCIUM OR MAGNESIUM FOR 4 HOURS & AVOID BIOTIN 02/24/21   McClanahan, Danton Sewer, NP  magnesium oxide (MAG-OX) 400 (241.3 MG) MG tablet Take 1 tablet (400 mg total) by mouth 2 (two) times daily. Patient taking differently: Take 400 mg by mouth 2 (two) times daily. Takes one tablet daily 12/21/14   Reyne Dumas, MD  Multiple Vitamin (MULITIVITAMIN WITH MINERALS) TABS Take 1 tablet by mouth daily.    [provider]  nitrofurantoin, macrocrystal-monohydrate, (MACROBID) 100 MG capsule Take 1 capsule (100 mg total) by mouth 2 (two) times daily. 03/07/21   [name], NP  nystatin (MYCOSTATIN/NYSTOP) powder Apply daily after a shower as needed 01/13/21   [name], NP  omeprazole (PRILOSEC) 40 MG capsule TAKE 1 CAPSULE BY MOUTH EVERY DAY 04/11/20   [name], NP  Probiotic Product (PROBIOTIC FORMULA PO) Take 1 tablet by mouth daily.     [provider]  terazosin (HYTRIN) 10 MG capsule TAKE 1 TABLET AT BEDTIME FOR BP 10/16/20   [name], NP    Physical Exam: BP 122/85 (BP Location: Right Arm)   Pulse 81   Temp 98.6 F (37 C)   Resp 15   SpO2 95%   . General: 87 y.o. year-old female well developed well nourished in no acute distress.  Alert and oriented x3.  Right eye covered. . Cardiovascular: Regular rate and rhythm with no rubs or gallops.  No thyromegaly or JVD noted.  No lower extremity edema. 2/4 pulses in all 4  extremities. [name] Kitchen Respiratory: Clear to auscultation with no wheezes or rales. Good inspiratory effort. . Abdomen: Soft nontender nondistended with normal bowel sounds x4 quadrants. . Muskuloskeletal: No cyanosis, clubbing or edema noted bilaterally . Neuro: CN II-XII intact, strength, sensation, reflexes . Skin: No ulcerative lesions noted or rashes . Psychiatry: Judgement and insight appear normal. Mood is appropriate for condition and setting          Labs on Admission:  Basic Metabolic Panel: No results for input(s): NA, K, CL, CO2, GLUCOSE, BUN, CREATININE, CALCIUM, MG, PHOS in the last 168 hours. Liver Function Tests: No results for input(s): AST, ALT, ALKPHOS, BILITOT,  PROT, ALBUMIN in the last 168 hours. No results for input(s): LIPASE, AMYLASE in the last 168 hours. No results for input(s): AMMONIA in the last 168 hours. CBC: No results for input(s): WBC, NEUTROABS, HGB, HCT, MCV, PLT in the last 168 hours. Cardiac Enzymes: No results for input(s): CKTOTAL, CKMB, CKMBINDEX, TROPONINI in the last 168 hours.  BNP (last 3 results) No results for input(s): BNP in the last 8760 hours.  ProBNP (last 3 results) No results for input(s): PROBNP in the last 8760 hours.  CBG: No results for input(s): GLUCAP in the last 168 hours.  Radiological Exams on Admission: No results found.  EKG: I independently viewed the EKG done and my findings are as followed: On outside facility EKG shows sinus rhythm with first-degree AV block, rate of 84.  Left bundle branch block.  No prior EKG on records.  Assessment/Plan Present on Admission: . Acute respiratory failure with hypoxia (HCC)  Active Problems:   Acute respiratory failure with hypoxia (HCC)  Acute hypoxic respiratory failure suspect multifactorial Secondary to low lung volume in the setting of large hiatal hernia versus elevated left hemidiaphragm versus large diaphragmatic hernia. Patient had a chest x-ray done at outside ED which  revealed bilateral perihilar and left basilar opacity most likely representing atelectasis, pneumonia not excluded particularly on the left lower lobe. Per medical records on Care Everywhere patient was saturating high 80% without oxygen. Maintain O2 saturation greater than 92%. BNP less than 50, troponin was negative 9, hemoglobin 13.3K, WBC 7.2K. Will cover empirically with doxycycline as the patient is allergic to penicillin Obtain procalcitonin and repeat chest x-ray. Home O2 evaluation for DC planning  Suspected left lower lobe infiltrates with concern for pneumonia Repeat chest x-ray IV doxycycline empirically IV fluid hydration Procalcitonin, CBC DC IV antibiotics if no evidence of active infective process.  Cardiac murmur, unclear if new onset Moderate aortic stenosis Had 2D echo done at outside facility on 04/10/2021 which showed LVEF 55 to 60%, moderate aortic stenosis.  Recommended a flow study to better assess the aortic valve and right ventricle. Case discussed with cardiology by accepting physician Dr. Trilby Drummer Defer management to cardiology  QTC prolongation Optimize magnesium and potassium levels Avoid QTC prolonging agents Repeat twelve-lead EKG tomorrow a.m.  Generalized fatigue/weakness PT OT to assess Fall precautions Obtain UA if possible  Low back pain States Tylenol works well for her. Tylenol as needed Avoid opiates  Right eye cataract, post op POD #1 Right eyes patched, continue prior regimen  Bladder prolapse Hysterectomy at age 20. Monitor  Hypothyroidism Resume home levothyroxine  GERD/large hiatal hernia Resume home PPI daily  Chronic anxiety Resume home Celexa    DVT prophylaxis: Subcu Lovenox daily.  Code Status: Full code.  Family Communication: None at bedside.  Disposition Plan: Admitted by Dr. Trilby Drummer to telemetry medical at [facility].  Consults called: Cardiology.  Admission status: Inpatient status.  Patient will require least  2 midnights for further evaluation and treatment of present condition.   Status is: Inpatient    Dispo: The patient is from: Home.               Anticipated d/c is to: Home.  Possibly on 04/13/2021 or when cardiology signs off               Patient currently not stable for discharge due to ongoing management of suspected pneumonia and possible new heart murmur.   Difficult to place patient, not applicable.  Kayleen Memos MD [facility] Pager [phone]  If 7PM-7AM, please contact night-coverage [email or web address] Password [facility]  04/11/2021, 12:43 AM

## 2021-04-11 NOTE — Evaluation (Signed)
Physical Therapy Evaluation Patient Details Name: Alyssa Schmitt MRN: [id number] DOB: [birth year] Today's Date: 04/11/2021   History of Present Illness  The pt is a 87 yo female presenting 5/6 s/p cataract surgery with acute hypoxia and hypotension. PMH includes: HTN, hypothyroidism, COPD, and obesity.  Clinical Impression  Pt in bed upon arrival of PT, agreeable to evaluation at this time. Prior to admission the pt was mobilizing short distances in the home with supervision and use of RW, living with daughter, and has CNA to assist with ADLs and mobility. The pt now presents with limitations in functional mobility, power, strength, ROM, activity tolerance, and safety awareness due to above dx, and will continue to benefit from skilled PT to address these deficits. The pt required significant assist of 2 to complete bed mobility at this time, required mod-maxA to maintain static sitting balance due to strong posterior lean and restlessness due to back pain. The pt was then able to complete initial sit-stand and stand-pivot transfers with maxA of 2 and use of RW due to strong posterior lean and poor awareness to make needed adjustments to posture/positioning despite cues. The pt demos poor functional strength and power in BLE to complete transfers or to steady with OOB mobility at this time, will benefit from continued acute therapies as well as HHPT at d/c to facilitate improved independence and decreased caregiver burden at d/c.      Follow Up Recommendations Home health PT;Supervision/Assistance - 24 hour    Equipment Recommendations   (CNA) hoyer lift, WC   Recommendations for Other Services       Precautions / Restrictions Precautions Precautions: Fall Precaution Comments: fall, watch O2 Restrictions Weight Bearing Restrictions: No      Mobility  Bed Mobility Overal bed mobility: Needs Assistance Bed Mobility: Supine to Sit     Supine to sit: Max assist;+2 for physical  assistance     General bed mobility comments: pt able to initiate movement of BLE to EOB, but then reports significant pain in back, required maxA and use of bed pad to scoot hips to EOB and assist posteriorly to maintain static sitting    Transfers Overall transfer level: Needs assistance Equipment used: Rolling walker (2 wheeled) Transfers: Sit to/from [facility] Sit to Stand: +2 physical assistance;Mod assist;From elevated surface Stand pivot transfers: Max assist;+2 physical assistance       General transfer comment: modA of 2 to power up from elevated surface, significant assist to hips and to correct strong posterior lean. significant trunk flexion, increased time and effort. maxA to complete pivot to recliner, assist to RW, assist to steady and manage wt shift for stepping  Ambulation/Gait Ambulation/Gait assistance: Max assist;+2 physical assistance Gait Distance (Feet): 3 Feet Assistive device: Rolling walker (2 wheeled) Gait Pattern/deviations: Step-to pattern;Decreased stride length;Trunk flexed Gait velocity: decreased Gait velocity interpretation: <1.31 ft/sec, indicative of household ambulator General Gait Details: assist at hips to maintain upright, strong posterior lean. trunk flexed despite cues for posture. minimal clearance bilaterally      Balance Overall balance assessment: Needs assistance Sitting-balance support: Single extremity supported;Feet supported Sitting balance-Leahy Scale: Poor Sitting balance - Comments: modA to minG with static sitting. significant kyphosis and posterior lean without assist Postural control: Posterior lean Standing balance support: Bilateral upper extremity supported Standing balance-Leahy Scale: Poor Standing balance comment: maxA with BUE support  Pertinent Vitals/Pain Pain Assessment: Faces Faces Pain Scale: Hurts even more Pain Location: back Pain Descriptors /  Indicators: Discomfort;Sore Pain Intervention(s): Limited activity within patient's tolerance;Monitored during session;Repositioned    Home Living Family/patient expects to be discharged to:: Private residence Living Arrangements: Children Available Help at Discharge: Family;Personal care attendant;Available 24 hours/day (daughter and CNA) Type of Home: House       Home Layout: Able to live on main level with bedroom/bathroom Home Equipment: Gilford Rile - 2 wheels;Bedside commode      Prior Function Level of Independence: Needs assistance   Gait / Transfers Assistance Needed: at least supervision with use of RW, intermittent assist to steady. walking shorter and shorter distances in the home.  ADL's / Homemaking Assistance Needed: assist for ADLs from CNA or daughter        Hand Dominance        Extremity/Trunk Assessment   Upper Extremity Assessment Upper Extremity Assessment: Defer to OT evaluation;Generalized weakness    Lower Extremity Assessment Lower Extremity Assessment: Generalized weakness    Cervical / Trunk Assessment Cervical / Trunk Assessment: Kyphotic  Communication   Communication: No difficulties  Cognition Arousal/Alertness: Awake/alert Behavior During Therapy: WFL for tasks assessed/performed Overall Cognitive Status: Impaired/Different from baseline Area of Impairment: Memory;Safety/judgement;Problem solving                     Memory: Decreased short-term memory   Safety/Judgement: Decreased awareness of safety   Problem Solving: Slow processing;Requires verbal cues;Requires tactile cues;Difficulty sequencing General Comments: pt benefits from simple cues with increased time to follow.  able to voice concerns and answer simple questions through session      General Comments      Exercises     Assessment/Plan    PT Assessment Patient needs continued PT services  PT Problem List Decreased strength;Decreased range of  motion;Decreased activity tolerance;Decreased balance;Decreased mobility;Decreased knowledge of use of DME;Decreased safety awareness       PT Treatment Interventions DME instruction;Gait training;Stair training;Functional mobility training;Therapeutic activities;Therapeutic exercise;Balance training;Patient/family education    PT Goals (Current goals can be found in the Care Plan section)  Acute Rehab PT Goals Patient Stated Goal: daughter: return home PT Goal Formulation: With patient/family Time For Goal Achievement: 04/25/21 Potential to Achieve Goals: Good    Frequency Min 3X/week   Barriers to discharge        Co-evaluation PT/OT/SLP Co-Evaluation/Treatment: Yes Reason for Co-Treatment: For patient/therapist safety;To address functional/ADL transfers PT goals addressed during session: Mobility/safety with mobility;Balance;Proper use of DME;Strengthening/ROM         AM-PAC PT "6 Clicks" Mobility  Outcome Measure Help needed turning from your back to your side while in a flat bed without using bedrails?: A Lot Help needed moving from lying on your back to sitting on the side of a flat bed without using bedrails?: A Lot Help needed moving to and from a bed to a chair (including a wheelchair)?: Total Help needed standing up from a chair using your arms (e.g., wheelchair or bedside chair)?: Total Help needed to walk in hospital room?: Total Help needed climbing 3-5 steps with a railing? : Total 6 Click Score: 8    End of Session Equipment Utilized During Treatment: Gait belt;Oxygen Activity Tolerance: Patient limited by fatigue Patient left: in chair;with call bell/phone within reach;with chair alarm set;with family/visitor present Nurse Communication: Mobility status PT Visit Diagnosis: Other abnormalities of gait and mobility (R26.89);Muscle weakness (generalized) (M62.81)    Time: 1607-3710 PT Time Calculation (  min) (ACUTE ONLY): 32 min   Charges:   PT  Evaluation $PT Eval Moderate Complexity: 1 Mod          Karma Ganja, PT, DPT   Acute Rehabilitation Department Pager #: [phone]  Otho Bellows 04/11/2021, 1:32 PM

## 2021-04-11 NOTE — Progress Notes (Signed)
Dr Donnamae Jude notified that UA was not collected due to Pt's chronic  bladder prolapsed.  No new orders

## 2021-04-11 NOTE — Progress Notes (Signed)
Patient arrived to 6N20, alert and oriented x3, able to make needs known. No DOB/SOB. 02sat 95%/2L. R eye patch noted S/P Cataract Surgery, denis any pain thus far. Patient was oriented to call bell and surroundings. Fall precautions in place. call bell within reach and will continue to monitor. V/S BP122/85 PR81 RR15 T98.6.

## 2021-04-12 DIAGNOSIS — J9601 Acute respiratory failure with hypoxia: Secondary | ICD-10-CM | POA: Diagnosis not present

## 2021-04-12 DIAGNOSIS — R0902 Hypoxemia: Secondary | ICD-10-CM | POA: Diagnosis present

## 2021-04-12 DIAGNOSIS — R531 Weakness: Secondary | ICD-10-CM

## 2021-04-12 LAB — COMPREHENSIVE METABOLIC PANEL
ALT: 13 U/L (ref 0–44)
AST: 25 U/L (ref 15–41)
Albumin: 2.9 g/dL — ABNORMAL LOW (ref 3.5–5.0)
Alkaline Phosphatase: 36 U/L — ABNORMAL LOW (ref 38–126)
Anion gap: 7 (ref 5–15)
BUN: 11 mg/dL (ref 8–23)
CO2: 31 mmol/L (ref 22–32)
Calcium: 8.8 mg/dL — ABNORMAL LOW (ref 8.9–10.3)
Chloride: 95 mmol/L — ABNORMAL LOW (ref 98–111)
Creatinine, Ser: 0.67 mg/dL (ref 0.44–1.00)
GFR, Estimated: >60 mL/min (ref >60)
Glucose, Bld: 95 mg/dL (ref 70–99)
Potassium: 3 mmol/L — ABNORMAL LOW (ref 3.5–5.1)
Sodium: 133 mmol/L — ABNORMAL LOW (ref 135–145)
Total Bilirubin: 1.1 mg/dL (ref 0.3–1.2)
Total Protein: 5.9 g/dL — ABNORMAL LOW (ref 6.5–8.1)

## 2021-04-12 LAB — MAGNESIUM: Magnesium: 1.7 mg/dL (ref 1.7–2.4)

## 2021-04-12 MED ORDER — POTASSIUM CHLORIDE CRYS ER 20 MEQ PO TBCR: 20.0000 meq | EXTENDED_RELEASE_TABLET | Freq: Every day | ORAL | 0 refills | Status: DC

## 2021-04-12 NOTE — Discharge Summary (Signed)
Physician Discharge Summary  Alyssa Schmitt PZW:[id number] DOB: March 12, 1928 DOA: 04/11/2021  PCP: Unk Pinto, MD  Admit date: 04/11/2021 Discharge date: 04/12/2021  Admitted From: home Disposition:  home  Recommendations for Outpatient Follow-up:  1. Follow up with PCP in 1-2 weeks 2. Please obtain BMP/CBC in one week  Home Health: PT Equipment/Devices: none  Discharge Condition: stable CODE STATUS: Full code Diet recommendation: regular  HPI: Per admitting MD, Alyssa Schmitt is a 87 y.o. female with medical history significant for essential hypertension, hypothyroidism, cataract, chronic anxiety who presented initially at outside facility sent from perioperative services due to acute hypoxia and low blood pressure post op.  Per medical records, patient showed up for a cataract procedure.  Procedure was performed and afterward the patient was found to be hypoxic and hypotensive.  She had received a small dose of opiates.  The procedure was completed without complication.  She was also noted to have a murmur.  No reported chest pain no reported dyspnea.  She was in her usual state of health prior to her procedure.  She was advised to go to the ED for further evaluation.  She went to outside facility ED noted above.  From there the patient and family requested transfer to [facility] since the patient is from [place] and receives care in [place].  Patient was accepted by Dr. Trilby Drummer as a direct admit.  Case was discussed with cardiology by accepting physician and will see in consultation.  At the time of this visit patient denies chest pain, palpitations, or dyspnea.  Only complaint is of having generalized fatigue and lower back pain after laying in the bed in outside facility ED.  Hospital Course / Discharge diagnoses: Principal problem Acute hypoxic respiratory failure-multifactorial, likely in the setting of atelectasis/low lung volumes in the setting of large hiatal  hernia, also postoperatively.  Chest x-ray was consistent with atelectasis, cannot exclude pneumonia however she has no respiratory symptoms, no cough, no fever or chills, no chest congestion.  Procalcitonin was negative, she is afebrile and WBC is normal.  She was weaned off to room air maintaining good O2 sats  Active problems Cardiac murmur, moderate aortic stenosis-2D echo done at an outside facility on 5/22 showed EF 55-60%, mild AS.  See cardiology as an outpatient Generalized fatigue, weakness-PT recommending home health Low back pain-continue supportive care, Tylenol.  Worsened perioperatively as she was on a stretcher for about 12 hours Right eye cataract -recently had surgery, outpatient follow-up. Hypothyroidism-resume home Synthroid GERD-continue PPI Chronic anxiety-continue home medications  Sepsis ruled out   Discharge Instructions   Allergies as of 04/12/2021      Reactions   Aspirin Swelling   Celebrex [celecoxib] Other (See Comments)   "my body lit up"   Fosamax [alendronate Sodium] Other (See Comments)   unknown   Keflex [cephalexin] Other (See Comments)   unknown   Penicillins Other (See Comments)   unknown   Prednisone Itching   Prevacid [lansoprazole] Other (See Comments)   unknown   Sulfa Antibiotics Other (See Comments)   unknown   Tetanus Toxoids Other (See Comments)   unknown   Tetracyclines & Related Nausea Only         Medication List    TAKE these medications   acetaminophen 325 MG tablet Commonly known as: TYLENOL Take 2 tablets (650 mg total) by mouth every 6 (six) hours as needed (prn TEMP>38.1 Celsius).   busPIRone 10 MG tablet Commonly known as: BUSPAR TAKE  1 TABLET 3 TIMES A DAY FOR CHRONIC ANXIETY   calcium-vitamin D 250-125 MG-UNIT tablet Commonly known as: OSCAL WITH D Take 1 tablet by mouth daily.   citalopram 20 MG tablet Commonly known as: CELEXA Take     1 tablet     Daily     For Mood      TAKE 1 TABLET BY MOUTH DAILY FOR  MOOD   fexofenadine 180 MG tablet Commonly known as: ALLEGRA Take 180 mg by mouth daily.   hydrochlorothiazide 25 MG tablet Commonly known as: HYDRODIURIL TAKE 1 TABLET DAILY FOR BP & FLUID   levothyroxine 50 MCG tablet Commonly known as: SYNTHROID TAKE 1 TABLET DAILY ON AN EMPTY STOMACH WITH ONLY WATER FOR 30 MINUTES & NO ANTACID MEDS, CALCIUM OR MAGNESIUM FOR 4 HOURS & AVOID BIOTIN   magnesium oxide 400 (241.3 Mg) MG tablet Commonly known as: MAG-OX Take 1 tablet (400 mg total) by mouth 2 (two) times daily. What changed: additional instructions   multivitamin with minerals Tabs tablet Take 1 tablet by mouth daily.   nitrofurantoin (macrocrystal-monohydrate) 100 MG capsule Commonly known as: Macrobid Take 1 capsule (100 mg total) by mouth 2 (two) times daily.   nystatin powder Commonly known as: MYCOSTATIN/NYSTOP Apply daily after a shower as needed   omeprazole 40 MG capsule Commonly known as: PRILOSEC TAKE 1 CAPSULE BY MOUTH EVERY DAY   potassium chloride SA 20 MEQ tablet Commonly known as: KLOR-CON Take 1 tablet (20 mEq total) by mouth daily.   PROBIOTIC FORMULA PO Take 1 tablet by mouth daily.   terazosin 10 MG capsule Commonly known as: HYTRIN TAKE 1 TABLET AT BEDTIME FOR BP   Vitamin D3 50 MCG (2000 UT) Tabs Take 4 tablets by mouth daily.       Follow-up Information    Unk Pinto, MD Follow up in 1 week(s).   Specialty: Internal Medicine Contact information: [id number] [place] [address] [phone]               Consultations:  None   Procedures/Studies:  DG CHEST PORT 1 VIEW  Result Date: 04/11/2021 CLINICAL DATA:  Acute respiratory failure with hypoxia EXAM: PORTABLE CHEST 1 VIEW COMPARISON:  06/22/2018 FINDINGS: Cardiomegaly. Large hiatal hernia was also seen in 2019. Low volume chest. Hazy density behind the heart and streaky density at the right base. Trace pleural fluid is possible. No pneumothorax.  IMPRESSION: 1. Limited low volume chest. 2. Retrocardiac opacification that could be atelectasis or infection. 3. Large hiatal hernia. Electronically Signed   By: Monte Fantasia M.D.   On: 04/11/2021 04:11     Subjective: - no chest pain, shortness of breath, no abdominal pain, nausea or vomiting.   Discharge Exam: BP (!) 153/73 (BP Location: Right Arm)   Pulse 75   Temp 98.3 F (36.8 C) (Oral)   Resp 14   SpO2 97%   General: Pt is alert, awake, not in acute distress Cardiovascular: RRR, S1/S2 +, no rubs, no gallops Respiratory: CTA bilaterally, no wheezing, no rhonchi Abdominal: Soft, NT, ND, bowel sounds + Extremities: no edema, no cyanosis    The results of significant diagnostics from this hospitalization (including imaging, microbiology, ancillary and laboratory) are listed below for reference.     Microbiology: Recent Results (from the past 240 hour(s))  SARS CORONAVIRUS 2 (TAT 6-24 HRS) Nasopharyngeal Nasopharyngeal Swab     Status: None   Collection Time: 04/11/21  1:08 AM   Specimen: Nasopharyngeal Swab  Result Value  Ref Range Status   SARS Coronavirus 2 NEGATIVE NEGATIVE Final    Comment: (NOTE) SARS-CoV-2 target nucleic acids are NOT DETECTED.  The SARS-CoV-2 RNA is generally detectable in upper and lower respiratory specimens during the acute phase of infection. Negative results do not preclude SARS-CoV-2 infection, do not rule out co-infections with other pathogens, and should not be used as the sole basis for treatment or other patient management decisions. Negative results must be combined with clinical observations, patient history, and epidemiological information. The expected result is Negative.  Fact Sheet for Patients: SugarRoll.be  Fact Sheet for Healthcare Providers: https://www.woods-mathews.com/  This test is not yet approved or cleared by the [place] FDA and  has been authorized for detection  and/or diagnosis of SARS-CoV-2 by FDA under an Emergency Use Authorization (EUA). This EUA will remain  in effect (meaning this test can be used) for the duration of the COVID-19 declaration under Se ction 564(b)(1) of the Act, 21 U.S.C. section 360bbb-3(b)(1), unless the authorization is terminated or revoked sooner.  Performed at [facility], [place] [address] [place], [place] [address]      Labs: Basic Metabolic Panel: Recent Labs  Lab 04/11/21 0217 04/12/21 0158  NA 136 133*  K 2.9* 3.0*  CL 96* 95*  CO2 30 31  GLUCOSE 89 95  BUN 9 11  CREATININE 0.69 0.67  CALCIUM 9.1 8.8*  MG 1.5* 1.7  PHOS 2.8  --    Liver Function Tests: Recent Labs  Lab 04/11/21 0217 04/12/21 0158  AST 26 25  ALT 14 13  ALKPHOS 38 36*  BILITOT 1.2 1.1  PROT 6.2* 5.9*  ALBUMIN 3.1* 2.9*   CBC: Recent Labs  Lab 04/11/21 0217  WBC 7.4  HGB 13.2  HCT 39.2  MCV 90.3  PLT 156   CBG: No results for input(s): GLUCAP in the last 168 hours. Hgb A1c No results for input(s): HGBA1C in the last 72 hours. Lipid Profile No results for input(s): CHOL, HDL, LDLCALC, TRIG, CHOLHDL, LDLDIRECT in the last 72 hours. Thyroid function studies No results for input(s): TSH, T4TOTAL, T3FREE, THYROIDAB in the last 72 hours.  Invalid input(s): FREET3 Urinalysis    Component Value Date/Time   COLORURINE DARK YELLOW 03/06/2021 1354   APPEARANCEUR TURBID (A) 03/06/2021 1354   LABSPEC 1.016 03/06/2021 1354   PHURINE 8.5 (H) 03/06/2021 1354   GLUCOSEU NEGATIVE 03/06/2021 1354   HGBUR TRACE (A) 03/06/2021 1354   BILIRUBINUR NEGATIVE 10/06/2016 1530   KETONESUR NEGATIVE 03/06/2021 1354   PROTEINUR 1+ (A) 03/06/2021 1354   UROBILINOGEN 0.2 12/19/2014 1357   NITRITE POSITIVE (A) 03/06/2021 1354   LEUKOCYTESUR 3+ (A) 03/06/2021 1354    FURTHER DISCHARGE INSTRUCTIONS:   Get Medicines reviewed and adjusted: Please take all your medications with you for your next visit with your Primary MD    Laboratory/radiological data: Please request your Primary MD to go over all hospital tests and procedure/radiological results at the follow up, please ask your Primary MD to get all Hospital records sent to his/her office.   In some cases, they will be blood work, cultures and biopsy results pending at the time of your discharge. Please request that your primary care M.D. goes through all the records of your hospital data and follows up on these results.   Also Note the following: If you experience worsening of your admission symptoms, develop shortness of breath, life threatening emergency, suicidal or homicidal thoughts you must seek medical attention immediately by calling  911 or calling your MD immediately  if symptoms less severe.   You must read complete instructions/literature along with all the possible adverse reactions/side effects for all the Medicines you take and that have been prescribed to you. Take any new Medicines after you have completely understood and accpet all the possible adverse reactions/side effects.    Do not drive when taking Pain medications or sleeping medications (Benzodaizepines)   Do not take more than prescribed Pain, Sleep and Anxiety Medications. It is not advisable to combine anxiety,sleep and pain medications without talking with your primary care practitioner   Special Instructions: If you have smoked or chewed Tobacco  in the last 2 yrs please stop smoking, stop any regular Alcohol  and or any Recreational drug use.   Wear Seat belts while driving.   Please note: You were cared for by a hospitalist during your hospital stay. Once you are discharged, your primary care physician will handle any further medical issues. Please note that NO REFILLS for any discharge medications will be authorized once you are discharged, as it is imperative that you return to your primary care physician (or establish a relationship with a primary care physician if you do not  have one) for your post hospital discharge needs so that they can reassess your need for medications and monitor your lab values.  Time coordinating discharge: 40 minutes  SIGNED:  Marzetta Board, MD, PhD 04/12/2021, 10:55 AM

## 2021-04-12 NOTE — Progress Notes (Signed)
As the day progressed apparently patient became much weaker than yesterday, requiring 2 RNs just to get her sitting. This appears weaker than yesterday. Initial plans were for dc however daughter cannot provide this level of support at home. Will cancel, DC, reconsult PT and perhaps an SNF will be better avenue for pt  Will reevaluate in am  Crisol Muecke M. Cruzita Lederer, MD, PhD [facility]  Between 7 am - 7 pm you can contact me via Amion (for emergencies) or [place] (non urgent matters).  I am not available 7 pm - 7 am, please contact night coverage MD/APP via Amion

## 2021-04-12 NOTE — Care Management Obs Status (Deleted)
[facility] NOTIFICATION   Patient Details  Name: Alyssa Schmitt MRN: [id number] Date of Birth: [birth year]   Medicare Observation Status Notification Given:  Yes    Elliot Gurney [place], [place] 04/12/2021, 12:12 PM

## 2021-04-12 NOTE — Discharge Instructions (Signed)
Follow with Unk Pinto, MD in 5-7 days  Please get a complete blood count and chemistry panel checked by your Primary MD at your next visit, and again as instructed by your Primary MD. Please get your medications reviewed and adjusted by your Primary MD.  Please request your Primary MD to go over all Hospital Tests and Procedure/Radiological results at the follow up, please get all Hospital records sent to your Prim MD by signing hospital release before you go home.  In some cases, there will be blood work, cultures and biopsy results pending at the time of your discharge. Please request that your primary care M.D. goes through all the records of your hospital data and follows up on these results.  If you had Pneumonia of Lung problems at the Hospital: Please get a 2 view Chest X ray done in 6-8 weeks after hospital discharge or sooner if instructed by your Primary MD.  If you have Congestive Heart Failure: Please call your Cardiologist or Primary MD anytime you have any of the following symptoms:  1) 3 pound weight gain in 24 hours or 5 pounds in 1 week  2) shortness of breath, with or without a dry hacking cough  3) swelling in the hands, feet or stomach  4) if you have to sleep on extra pillows at night in order to breathe  Follow cardiac low salt diet and 1.5 lit/day fluid restriction.  If you have diabetes Accuchecks 4 times/day, Once in AM empty stomach and then before each meal. Log in all results and show them to your primary doctor at your next visit. If any glucose reading is under 80 or above 300 call your primary MD immediately.  If you have Seizure/Convulsions/Epilepsy: Please do not drive, operate heavy machinery, participate in activities at heights or participate in high speed sports until you have seen by Primary MD or a Neurologist and advised to do so again. Per [facility] statutes, patients with seizures are not allowed to drive until they have been  seizure-free for six months.  Use caution when using heavy equipment or power tools. Avoid working on ladders or at heights. Take showers instead of baths. Ensure the water temperature is not too high on the home water heater. Do not go swimming alone. Do not lock yourself in a room alone (i.e. bathroom). When caring for infants or small children, sit down when holding, feeding, or changing them to minimize risk of injury to the child in the event you have a seizure. Maintain good sleep hygiene. Avoid alcohol.   If you had Gastrointestinal Bleeding: Please ask your Primary MD to check a complete blood count within one week of discharge or at your next visit. Your endoscopic/colonoscopic biopsies that are pending at the time of discharge, will also need to followed by your Primary MD.  Get Medicines reviewed and adjusted. Please take all your medications with you for your next visit with your Primary MD  Please request your Primary MD to go over all hospital tests and procedure/radiological results at the follow up, please ask your Primary MD to get all Hospital records sent to his/her office.  If you experience worsening of your admission symptoms, develop shortness of breath, life threatening emergency, suicidal or homicidal thoughts you must seek medical attention immediately by calling 911 or calling your MD immediately  if symptoms less severe.  You must read complete instructions/literature along with all the possible adverse reactions/side effects for all the Medicines you take  and that have been prescribed to you. Take any new Medicines after you have completely understood and accpet all the possible adverse reactions/side effects.   Do not drive or operate heavy machinery when taking Pain medications.   Do not take more than prescribed Pain, Sleep and Anxiety Medications  Special Instructions: If you have smoked or chewed Tobacco  in the last 2 yrs please stop smoking, stop any regular  Alcohol  and or any Recreational drug use.  Wear Seat belts while driving.  Please note You were cared for by a hospitalist during your hospital stay. If you have any questions about your discharge medications or the care you received while you were in the hospital after you are discharged, you can call the unit and asked to speak with the hospitalist on call if the hospitalist that took care of you is not available. Once you are discharged, your primary care physician will handle any further medical issues. Please note that NO REFILLS for any discharge medications will be authorized once you are discharged, as it is imperative that you return to your primary care physician (or establish a relationship with a primary care physician if you do not have one) for your aftercare needs so that they can reassess your need for medications and monitor your lab values.  You can reach the hospitalist office at phone [phone] or fax [phone]   If you do not have a primary care physician, you can call [phone] for a physician referral.  Activity: As tolerated with Full fall precautions use walker/cane & assistance as needed    Diet: regular  Disposition Home

## 2021-04-12 NOTE — Care Management CC44 (Signed)
Condition Code 44 Documentation Completed  Patient Details  Name: MEKENZIE MODESTE MRN: [id number] Date of Birth: 01/16/1928   Condition Code 44 given:  Yes Patient signature on Condition Code 44 notice:  Yes Documentation of 2 MD's agreement:  Yes Code 44 added to claim:  Yes    Elliot Gurney [place], [place] 04/12/2021, 12:26 PM

## 2021-04-13 ENCOUNTER — Observation Stay (HOSPITAL_COMMUNITY): Payer: Medicare Other

## 2021-04-13 DIAGNOSIS — R0902 Hypoxemia: Secondary | ICD-10-CM | POA: Diagnosis not present

## 2021-04-13 DIAGNOSIS — J9601 Acute respiratory failure with hypoxia: Secondary | ICD-10-CM | POA: Diagnosis not present

## 2021-04-13 DIAGNOSIS — J811 Chronic pulmonary edema: Secondary | ICD-10-CM | POA: Diagnosis not present

## 2021-04-13 DIAGNOSIS — I517 Cardiomegaly: Secondary | ICD-10-CM | POA: Diagnosis not present

## 2021-04-13 LAB — BASIC METABOLIC PANEL
Anion gap: 8 (ref 5–15)
BUN: 6 mg/dL — ABNORMAL LOW (ref 8–23)
CO2: 27 mmol/L (ref 22–32)
Calcium: 8.6 mg/dL — ABNORMAL LOW (ref 8.9–10.3)
Chloride: 99 mmol/L (ref 98–111)
Creatinine, Ser: 0.55 mg/dL (ref 0.44–1.00)
GFR, Estimated: >60 mL/min (ref >60)
Glucose, Bld: 84 mg/dL (ref 70–99)
Potassium: 3.2 mmol/L — ABNORMAL LOW (ref 3.5–5.1)
Sodium: 134 mmol/L — ABNORMAL LOW (ref 135–145)

## 2021-04-13 MED ORDER — FUROSEMIDE 10 MG/ML IJ SOLN
20.0000 mg | Freq: Once | INTRAMUSCULAR | Status: AC
  Administered 2021-04-13: 20 mg via INTRAVENOUS
  Filled 2021-04-13: qty 2

## 2021-04-13 MED ORDER — WHITE PETROLATUM EX OINT
TOPICAL_OINTMENT | CUTANEOUS | Status: AC
  Filled 2021-04-13: qty 28.35

## 2021-04-13 NOTE — Progress Notes (Signed)
Physical Therapy Treatment Patient Details Name: Alyssa Schmitt MRN: [id number] DOB: 1928/03/01 Today's Date: 04/13/2021    History of Present Illness The pt is a 87 yo female presenting 5/6 s/p cataract surgery with acute hypoxia and hypotension. PMH includes: HTN, hypothyroidism, COPD, and obesity.    PT Comments    Pt is not progressing towards her physical therapy goals. Displays significant debility, poor sitting balance, pain, and decreased cognition. Requiring total assist for bed mobility, demonstrates retropulsion at edge of bed, and is unable to stand. Focus on low level seated exercises and anterior weight shift with multimodal cueing. SpO2 91% on RA, HR 109-114 bpm. Pt reports her current CNA "has arthritis and cannot push or pull on her." Pt daughter in process of contacting [facility] to secure additional help, however, this has not been confirmed yet. Discussed with pt and pt daughter updated recommendation of ST SNF.    Follow Up Recommendations  SNF     Equipment Recommendations  Wheelchair (measurements PT);Wheelchair cushion (measurements PT);Hospital bed (hoyer lift)    Recommendations for Other Services       Precautions / Restrictions Precautions Precautions: Fall Precaution Comments: watch O2 Restrictions Weight Bearing Restrictions: No    Mobility  Bed Mobility Overal bed mobility: Needs Assistance Bed Mobility: Supine to Sit;Sit to Supine     Supine to sit: Total assist Sit to supine: Total assist   General bed mobility comments: Pt not initiating, support at BLE's and trunk to guide to upright and return to supine, pt in significant pain with transition.    Transfers                 General transfer comment: Unable  Ambulation/Gait                 Stairs             Wheelchair Mobility    Modified Rankin (Stroke Patients Only)       Balance Overall balance assessment: Needs assistance Sitting-balance support:  Single extremity supported;Feet supported Sitting balance-Leahy Scale: Poor Sitting balance - Comments: Pt requiring min guard-modA, verbal/tactile cueing for anterior weight shift Postural control: Posterior lean                                  Cognition Arousal/Alertness: Awake/alert Behavior During Therapy: WFL for tasks assessed/performed Overall Cognitive Status: Impaired/Different from baseline Area of Impairment: Memory;Safety/judgement;Problem solving                     Memory: Decreased short-term memory   Safety/Judgement: Decreased awareness of safety   Problem Solving: Slow processing;Requires verbal cues;Requires tactile cues;Difficulty sequencing General Comments: pt benefits from simple cues with increased time to follow. states she does not have adequate physical help at home but also does not see benefit of going to short term rehab      Exercises General Exercises - Lower Extremity Long Arc Quad: Both;5 reps;Seated    General Comments        Pertinent Vitals/Pain Pain Assessment: Faces Faces Pain Scale: Hurts worst Pain Location: back, abdomen Pain Descriptors / Indicators: Discomfort;Sore;Moaning;Other (Comment) (screaming with bed mobility) Pain Intervention(s): Limited activity within patient's tolerance;Monitored during session;Repositioned    Home Living                      Prior Function  PT Goals (current goals can now be found in the care plan section) Acute Rehab PT Goals Patient Stated Goal: daughter: return home PT Goal Formulation: With patient/family Time For Goal Achievement: 04/25/21 Potential to Achieve Goals: Good Progress towards PT goals: Not progressing toward goals - comment    Frequency    Min 3X/week      PT Plan Discharge plan needs to be updated    Co-evaluation              AM-PAC PT "6 Clicks" Mobility   Outcome Measure  Help needed turning from your back to  your side while in a flat bed without using bedrails?: Total Help needed moving from lying on your back to sitting on the side of a flat bed without using bedrails?: Total Help needed moving to and from a bed to a chair (including a wheelchair)?: Total Help needed standing up from a chair using your arms (e.g., wheelchair or bedside chair)?: Total Help needed to walk in hospital room?: Total Help needed climbing 3-5 steps with a railing? : Total 6 Click Score: 6    End of Session Equipment Utilized During Treatment: Gait belt;Oxygen Activity Tolerance: Patient limited by fatigue;Patient limited by pain Patient left: with call bell/phone within reach;in bed;with bed alarm set Nurse Communication: Mobility status PT Visit Diagnosis: Other abnormalities of gait and mobility (R26.89);Muscle weakness (generalized) (M62.81)     Time: 5284-1324 PT Time Calculation (min) (ACUTE ONLY): 34 min  Charges:  $Therapeutic Activity: 23-37 mins                     Wyona Almas, PT, DPT Acute Rehabilitation Services Pager [phone] Office [phone]    Deno Etienne 04/13/2021, 10:58 AM

## 2021-04-13 NOTE — Plan of Care (Signed)

## 2021-04-13 NOTE — Progress Notes (Addendum)
PROGRESS NOTE  Alyssa Schmitt UMP:[id number] DOB: 02-23-1928 DOA: 04/11/2021 PCP: Unk Pinto, MD   LOS: 1 day   Brief Narrative / Interim history: Alyssa Schmitt Breedenis a 87 y.o.femalewith medical history significant foressential hypertension, hypothyroidism, cataract, chronic anxiety who presented initially at outside facility sent from perioperative services due to acute hypoxia and low blood pressurepost op.  Patient improved a little bit on 5/6, she was scheduled for discharge on 5/7 however she was felt to be extremely weak, more so than her baseline and discharge has been canceled.  Subjective / 24h Interval events: Seen this morning, at bedside, physical therapy is in the room.  She is barely able to sit at the edge of the bed due to weakness.  Physical therapy will change recommendation to SNF as they feel she is unsafe to return home.  Patient complains of mild congestion  Assessment & Plan: Principal Problem Acute hypoxic respiratory failure, acute on chronic dCHF-initially resolved but she is back on oxygen this morning.  There is some congestion.  Chest x-ray this morning with a degree of fluid overload.  Currently she has no significant cough, no fever, no chills, procalcitonin was negative and monitor off antibiotics. -She is also slightly more tachycardic today around 110, sinus rhythm. Lasix x 1 given CXR findings  Active Problems Cardiac murmur, moderate aortic stenosis-2D echo done at an outside facility on 5/22 showed EF 55-60%, mild AS. Lasix x 1  Generalized fatigue, weakness-PT initially recommending home health however in the past couple of days in the hospital she has been significantly weaker now pursuing SNF to which family is agreeable  Low back pain-continue supportive care, Tylenol.  Worsened perioperatively as she was on a stretcher for about 12 hours  Right eye cataract -recently had surgery, outpatient follow-up.  Hypothyroidism-resume home  Synthroid  GERD-continue PPI  Chronic anxiety-continue home medications  Scheduled Meds: . white petrolatum      . acidophilus  1 capsule Oral Daily  . citalopram  20 mg Oral Daily  . enoxaparin (LOVENOX) injection  40 mg Subcutaneous Daily  . gatifloxacin  1 drop Right Eye QID  . levothyroxine  50 mcg Oral Q0600  . pantoprazole  40 mg Oral Daily  . potassium chloride  40 mEq Oral BID  . prednisoLONE acetate  1 drop Right Eye QID   Continuous Infusions: PRN Meds:.acetaminophen, melatonin, prochlorperazine  Diet Orders (From admission, onward)    Start     Ordered   04/11/21 0058  Diet Heart Room service appropriate? Yes; Fluid consistency: Thin  Diet effective now       Question Answer Comment  Room service appropriate? Yes   Fluid consistency: Thin      04/11/21 0057          DVT prophylaxis: enoxaparin (LOVENOX) injection 40 mg Start: 04/11/21 1000 SCDs Start: 04/11/21 0040     Code Status: Full Code  Family Communication: Discussed with daughter  Status is: Observation  The patient will require care spanning > 2 midnights and should be moved to inpatient because: Inpatient level of care appropriate due to severity of illness  Dispo: The patient is from: Home              Anticipated d/c is to: SNF              Patient currently is not medically stable to d/c.   Difficult to place patient No   Level of care: Med-Surg  Consultants:  none  Procedures:  none  Microbiology  none  Antimicrobials: none    Objective: Vitals:   04/12/21 0500 04/12/21 1610 04/12/21 2151 04/13/21 0327  BP: (!) 153/73 (!) 125/59 113/62 131/84  Pulse: 75 78 72 82  Resp: 14 16 16 18   Temp: 98.3 F (36.8 C) 98.2 F (36.8 C) 98.6 F (37 C) 98.6 F (37 C)  TempSrc: Oral Axillary Oral Oral  SpO2: 97% 96% 97% 96%    Intake/Output Summary (Last 24 hours) at 04/13/2021 1041 Last data filed at 04/13/2021 0347 Gross per 24 hour  Intake 100 ml  Output 750 ml  Net -650 ml    There were no vitals filed for this visit.  Examination:  Constitutional: NAD Eyes: no scleral icterus ENMT: Mucous membranes are moist.  Neck: normal, supple Respiratory: Diminished at the bases, no significant rhonchi.  Significantly kyphotic Cardiovascular: Regular rate and rhythm, no murmurs / rubs / gallops.  No peripheral edema.  Slight tachycardia noted Abdomen: non distended, no tenderness. Bowel sounds positive.  Musculoskeletal: no clubbing / cyanosis.  Skin: no rashes Neurologic: No focal deficits  Data Reviewed: I have independently reviewed following labs and imaging studies   CBC: Recent Labs  Lab 04/11/21 0217  WBC 7.4  HGB 13.2  HCT 39.2  MCV 90.3  PLT 425   Basic Metabolic Panel: Recent Labs  Lab 04/11/21 0217 04/12/21 0158 04/13/21 0612  NA 136 133* 134*  K 2.9* 3.0* 3.2*  CL 96* 95* 99  CO2 30 31 27   GLUCOSE 89 95 84  BUN 9 11 6*  CREATININE 0.69 0.67 0.55  CALCIUM 9.1 8.8* 8.6*  MG 1.5* 1.7  --   PHOS 2.8  --   --    Liver Function Tests: Recent Labs  Lab 04/11/21 0217 04/12/21 0158  AST 26 25  ALT 14 13  ALKPHOS 38 36*  BILITOT 1.2 1.1  PROT 6.2* 5.9*  ALBUMIN 3.1* 2.9*   Coagulation Profile: No results for input(s): INR, PROTIME in the last 168 hours. HbA1C: No results for input(s): HGBA1C in the last 72 hours. CBG: No results for input(s): GLUCAP in the last 168 hours.  Recent Results (from the past 240 hour(s))  SARS CORONAVIRUS 2 (TAT 6-24 HRS) Nasopharyngeal Nasopharyngeal Swab     Status: None   Collection Time: 04/11/21  1:08 AM   Specimen: Nasopharyngeal Swab  Result Value Ref Range Status   SARS Coronavirus 2 NEGATIVE NEGATIVE Final    Comment: (NOTE) SARS-CoV-2 target nucleic acids are NOT DETECTED.  The SARS-CoV-2 RNA is generally detectable in upper and lower respiratory specimens during the acute phase of infection. Negative results do not preclude SARS-CoV-2 infection, do not rule out co-infections with  other pathogens, and should not be used as the sole basis for treatment or other patient management decisions. Negative results must be combined with clinical observations, patient history, and epidemiological information. The expected result is Negative.  Fact Sheet for Patients: SugarRoll.be  Fact Sheet for Healthcare Providers: https://www.woods-mathews.com/  This test is not yet approved or cleared by the [place] FDA and  has been authorized for detection and/or diagnosis of SARS-CoV-2 by FDA under an Emergency Use Authorization (EUA). This EUA will remain  in effect (meaning this test can be used) for the duration of the COVID-19 declaration under Se ction 564(b)(1) of the Act, 21 U.S.C. section 360bbb-3(b)(1), unless the authorization is terminated or revoked sooner.  Performed at [facility], [place] [address], [place], [place]  [place]      Radiology Studies: No results found.  Marzetta Board, MD, PhD [facility]  Between 7 am - 7 pm I am available, please contact me via Amion (for emergencies) or Securechat (non urgent messages)  Between 7 pm - 7 am I am not available, please contact night coverage MD/APP via Amion

## 2021-04-14 DIAGNOSIS — J9601 Acute respiratory failure with hypoxia: Secondary | ICD-10-CM | POA: Diagnosis not present

## 2021-04-14 DIAGNOSIS — I350 Nonrheumatic aortic (valve) stenosis: Secondary | ICD-10-CM | POA: Diagnosis present

## 2021-04-14 DIAGNOSIS — F419 Anxiety disorder, unspecified: Secondary | ICD-10-CM | POA: Diagnosis present

## 2021-04-14 DIAGNOSIS — Z888 Allergy status to other drugs, medicaments and biological substances status: Secondary | ICD-10-CM | POA: Diagnosis not present

## 2021-04-14 DIAGNOSIS — J449 Chronic obstructive pulmonary disease, unspecified: Secondary | ICD-10-CM | POA: Diagnosis present

## 2021-04-14 DIAGNOSIS — K219 Gastro-esophageal reflux disease without esophagitis: Secondary | ICD-10-CM | POA: Diagnosis present

## 2021-04-14 DIAGNOSIS — I959 Hypotension, unspecified: Secondary | ICD-10-CM | POA: Diagnosis present

## 2021-04-14 DIAGNOSIS — I5033 Acute on chronic diastolic (congestive) heart failure: Secondary | ICD-10-CM | POA: Diagnosis present

## 2021-04-14 DIAGNOSIS — E785 Hyperlipidemia, unspecified: Secondary | ICD-10-CM | POA: Diagnosis present

## 2021-04-14 DIAGNOSIS — Z79899 Other long term (current) drug therapy: Secondary | ICD-10-CM | POA: Diagnosis not present

## 2021-04-14 DIAGNOSIS — Z880 Allergy status to penicillin: Secondary | ICD-10-CM | POA: Diagnosis not present

## 2021-04-14 DIAGNOSIS — Z881 Allergy status to other antibiotic agents status: Secondary | ICD-10-CM | POA: Diagnosis not present

## 2021-04-14 DIAGNOSIS — Z66 Do not resuscitate: Secondary | ICD-10-CM | POA: Diagnosis not present

## 2021-04-14 DIAGNOSIS — Z886 Allergy status to analgesic agent status: Secondary | ICD-10-CM | POA: Diagnosis not present

## 2021-04-14 DIAGNOSIS — E559 Vitamin D deficiency, unspecified: Secondary | ICD-10-CM | POA: Diagnosis present

## 2021-04-14 DIAGNOSIS — Z833 Family history of diabetes mellitus: Secondary | ICD-10-CM | POA: Diagnosis not present

## 2021-04-14 DIAGNOSIS — Z882 Allergy status to sulfonamides status: Secondary | ICD-10-CM | POA: Diagnosis not present

## 2021-04-14 DIAGNOSIS — Z8249 Family history of ischemic heart disease and other diseases of the circulatory system: Secondary | ICD-10-CM | POA: Diagnosis not present

## 2021-04-14 DIAGNOSIS — J9811 Atelectasis: Secondary | ICD-10-CM | POA: Diagnosis present

## 2021-04-14 DIAGNOSIS — M810 Age-related osteoporosis without current pathological fracture: Secondary | ICD-10-CM | POA: Diagnosis present

## 2021-04-14 DIAGNOSIS — Z20822 Contact with and (suspected) exposure to covid-19: Secondary | ICD-10-CM | POA: Diagnosis present

## 2021-04-14 DIAGNOSIS — E039 Hypothyroidism, unspecified: Secondary | ICD-10-CM | POA: Diagnosis present

## 2021-04-14 DIAGNOSIS — I110 Hypertensive heart disease with heart failure: Secondary | ICD-10-CM | POA: Diagnosis present

## 2021-04-14 LAB — BASIC METABOLIC PANEL
Anion gap: 9 (ref 5–15)
Anion gap: 10 (ref 5–15)
BUN: 8 mg/dL (ref 8–23)
BUN: 9 mg/dL (ref 8–23)
CO2: 26 mmol/L (ref 22–32)
CO2: 26 mmol/L (ref 22–32)
Calcium: 8.7 mg/dL — ABNORMAL LOW (ref 8.9–10.3)
Calcium: 9 mg/dL (ref 8.9–10.3)
Chloride: 98 mmol/L (ref 98–111)
Chloride: 99 mmol/L (ref 98–111)
Creatinine, Ser: 0.55 mg/dL (ref 0.44–1.00)
Creatinine, Ser: 0.61 mg/dL (ref 0.44–1.00)
GFR, Estimated: >60 mL/min (ref >60)
GFR, Estimated: >60 mL/min (ref >60)
Glucose, Bld: 98 mg/dL (ref 70–99)
Glucose, Bld: 105 mg/dL — ABNORMAL HIGH (ref 70–99)
Potassium: 4 mmol/L (ref 3.5–5.1)
Potassium: 3.8 mmol/L (ref 3.5–5.1)
Sodium: 133 mmol/L — ABNORMAL LOW (ref 135–145)
Sodium: 135 mmol/L (ref 135–145)

## 2021-04-14 LAB — CBC
HCT: 41.8 % (ref 36.0–46.0)
Hemoglobin: 14.2 g/dL (ref 12.0–15.0)
MCH: 30.1 pg (ref 26.0–34.0)
MCHC: 34 g/dL (ref 30.0–36.0)
MCV: 88.7 fL (ref 80.0–100.0)
Platelets: 200 10*3/uL (ref 150–400)
RBC: 4.71 MIL/uL (ref 3.87–5.11)
RDW: 12.9 % (ref 11.5–15.5)
WBC: 8 10*3/uL (ref 4.0–10.5)
nRBC: 0 % (ref 0.0–0.2)

## 2021-04-14 LAB — TROPONIN I (HIGH SENSITIVITY)
Troponin I (High Sensitivity): 27 ng/L — ABNORMAL HIGH (ref <18)
Troponin I (High Sensitivity): 22 ng/L — ABNORMAL HIGH (ref <18)

## 2021-04-14 LAB — GLUCOSE, CAPILLARY: Glucose-Capillary: 100 mg/dL — ABNORMAL HIGH (ref 70–99)

## 2021-04-14 LAB — MAGNESIUM: Magnesium: 1.4 mg/dL — ABNORMAL LOW (ref 1.7–2.4)

## 2021-04-14 MED ORDER — MAGNESIUM SULFATE 2 GM/50ML IV SOLN
2.0000 g | Freq: Once | INTRAVENOUS | Status: AC
  Administered 2021-04-14: 2 g via INTRAVENOUS
  Filled 2021-04-14: qty 50

## 2021-04-14 MED ORDER — BUSPIRONE HCL 5 MG PO TABS: 10.0000 mg | ORAL_TABLET | Freq: Two times a day (BID) | ORAL | Status: DC

## 2021-04-14 MED ORDER — FUROSEMIDE 10 MG/ML IJ SOLN
20.0000 mg | Freq: Once | INTRAMUSCULAR | Status: AC
  Administered 2021-04-14: 20 mg via INTRAVENOUS
  Filled 2021-04-14: qty 2

## 2021-04-14 MED ORDER — POTASSIUM CHLORIDE CRYS ER 20 MEQ PO TBCR
20.0000 meq | EXTENDED_RELEASE_TABLET | Freq: Once | ORAL | Status: AC
  Administered 2021-04-14: 20 meq via ORAL
  Filled 2021-04-14: qty 1

## 2021-04-14 MED ORDER — ADULT MULTIVITAMIN W/MINERALS CH
1.0000 | ORAL_TABLET | Freq: Every day | ORAL | Status: DC
  Administered 2021-04-14 – 2021-04-16 (×3): 1 via ORAL
  Filled 2021-04-14 (×3): qty 1

## 2021-04-14 MED ORDER — MAGNESIUM OXIDE -MG SUPPLEMENT 400 (240 MG) MG PO TABS
400.0000 mg | ORAL_TABLET | Freq: Two times a day (BID) | ORAL | Status: DC
  Administered 2021-04-14 – 2021-04-16 (×5): 400 mg via ORAL
  Filled 2021-04-14 (×5): qty 1

## 2021-04-14 NOTE — Care Management Obs Status (Signed)
MEDICARE OBSERVATION STATUS NOTIFICATION   Patient Details  Name: Alyssa Schmitt MRN: [id number] Date of Birth: 01/25/1928   Medicare Observation Status Notification Given:  Yes    Kaulin Chaves Michelle, LCSW 04/12/2021, 12:12 PM 

## 2021-04-14 NOTE — TOC Initial Note (Signed)
Transition of Care [facility]) - Initial/Assessment Note    Patient Details  Name: Alyssa Schmitt MRN: [id number] Date of Birth: [birth year]  [facility] of [facility] CM/SW Contact:    Emeterio Reeve, [place] Phone Number: 04/14/2021, 4:49 PM  Clinical Narrative:                 CSW spoke to pts daughter Lelon Frohlich at bedside. Lelon Frohlich stated that pt was living at home with her other daughter [name]. PTA pt went stayed between her bed and lift chair. Pt is able to walk to bathroom with assistance. Pts daughters prepare her meals. Pt has an aide that comes in daily and helps with ADL's.   CSW reviewed PT/OT reccs of SNF. Daughters are open to pt going to a SNF, no preference at this time. Lelon Frohlich gave CSW permission to fax out to facilities in the area.   Expected Discharge Plan: Skilled Nursing Facility Barriers to Discharge: Continued Medical Work up   Patient Goals and CMS Choice Patient states their goals for this hospitalization and ongoing recovery are:: to move better CMS Medicare.gov Compare Post Acute Care list provided to:: Patient Choice offered to / list presented to : [place]  Expected Discharge Plan and Services Expected Discharge Plan: [place] arrangements for the past 2 months: Single Family Home Expected Discharge Date: 04/12/21                                    Prior Living Arrangements/Services Living arrangements for the past 2 months: Single Family Home Lives with:: Adult Children Patient language and need for interpreter reviewed:: Yes Do you feel safe going back to the place where you live?: Yes      Need for Family Participation in Patient Care: Yes (Comment) Care giver support system in place?: Yes (comment) Current home services: DME,Homehealth aide Criminal Activity/Legal Involvement Pertinent to Current Situation/Hospitalization: No - Comment as needed  Activities of Daily Living      Permission Sought/Granted    Permission granted to share information with : Yes, Verbal Permission Granted     Permission granted to share info w AGENCY: SNF  Permission granted to share info w Relationship: Daughters     Emotional Assessment Appearance:: Appears stated age Attitude/Demeanor/Rapport: Unable to Assess (Sleeping) Affect (typically observed): Unable to Assess (Sleeping) Orientation: : Oriented to Self,Oriented to Place,Oriented to  Time,Oriented to Situation Alcohol / Substance Use: Not Applicable Psych Involvement: No (comment)  Admission diagnosis:  Acute respiratory failure with hypoxia (HCC) [J96.01] Hypoxemia [R09.02] Patient Active Problem List   Diagnosis Date Noted  . Hypoxemia 04/12/2021  . Acute respiratory failure with hypoxia ([place]) 04/11/2021  . Pressure injury of skin 04/11/2021  . Environmental allergies 09/26/2020  . Combined form of age-related cataract, left eye 09/10/2020  . Acquired female bladder prolapse 03/06/2020  . Pressure injury of buttock, stage 1 03/06/2020  . Tear of skin of right buttock 10/05/2018  . Intertriginous candidiasis 10/05/2018  . Elevated LFTs 08/26/2018  . Hypertensive retinopathy of both eyes 09/21/2017  . Depression, major, in remission ([place]) 06/22/2016  . COPD 08/15/2015  . GERD  08/15/2015  . Osteoporosis 07/16/2015  . Hypothyroidism   . Medication management 03/20/2014  . Essential hypertension   . Hyperlipidemia, mixed   . Abnormal glucose   . Vitamin D deficiency   . Varicose veins of bilateral  lower extremities with other complications 22/29/7989   PCP:  Unk Pinto, MD Pharmacy:   [id number] [place], [place] [address] Phone: [phone] Fax: [phone]  [facility] [address] [place] [address] Phone: [phone] Fax: [phone]     Social Determinants of Health (SDOH)  Interventions    Readmission Risk Interventions No flowsheet data found.  Emeterio Reeve, Latanya Presser, [place] Social Worker [phone]

## 2021-04-14 NOTE — Progress Notes (Addendum)
PROGRESS NOTE    Alyssa Schmitt  WGN:[id number] DOB: [birth year] DOA: 04/11/2021 PCP: Lucky Cowboy, MD   Brief Narrative: Alyssa Cazenave Breedenis a 87 y.o.femalewith medical history significant foressential hypertension, hypothyroidism, cataract, chronic anxiety who presented initially at outside facility sent from perioperative services due to acute hypoxia and low blood pressurepost op.  Patient improved a little bit on 5/6, she was scheduled for discharge on 5/7 however she was felt to be extremely weak, more so than her baseline and discharge has been canceled.  She was admitted with Acute on Chronic diastolic Heart failure, chest x ray showed pulmonary edema, she is requiring 2 L oxygen. She is receiving IV lasix.    Assessment & Plan:   Active Problems:   Acute respiratory failure with hypoxia (HCC)   Pressure injury of skin   Hypoxemia  1-Acute Hypoxic Respiratory Failure secondary to acute on chronic diastolic HF exacerbation;  Post op out side facility develops hypoxia and hypotension.  Chest x ray: cardiomegaly with increase pulmonary vascular congestion.  Urine out put: 2.1 L.  Plan to repeat IV lasix 20 mg today. Had IV lasix 5/08.  2-Moderate Aortic stenosis; ECHO per notes from outside facility showed Ef 55 to 60 %mild AS.  Treating for HF exacerbation.   3-St Changes on Telemetry:  EKG no ST elevation present. Patient chest pain free. Troponin not significantly elevated.  Replaced magnesium. Will discuss with cardiology.   4-Hypomagnesemia; replete IV>   5-Generalized fatigue, weakness; PT recommend SNF>   6-Low back pain:  continue with tylenol/   7-Right eye Cataract; recently had Sx. Out patient follow up/   8-Hypothyroidism; continue with synthroid.      Pressure Injury 04/11/21 Buttocks Stage 1 -  Intact skin with non-blanchable redness of a localized area usually over a bony prominence. (Active)  04/11/21 0030  Location: Buttocks  Location  Orientation:   Staging: Stage 1 -  Intact skin with non-blanchable redness of a localized area usually over a bony prominence.  Wound Description (Comments):   Present on Admission: Yes        Estimated body mass index is 35.15 kg/m as calculated from the following:   Height as of 08/19/20: 5' (1.524 m).   Weight as of 08/19/20: 81.6 kg.   DVT prophylaxis: Lovenox Code Status: Discussed with POA, patient is DNR Family Communication: care discussed with Daughter who was at bedside Disposition Plan:  Status is: Observation  The patient will require care spanning > 2 midnights and should be moved to inpatient because: IV treatments appropriate due to intensity of illness or inability to take PO  Dispo: The patient is from: Home              Anticipated d/c is to: SNF              Patient currently is not medically stable to d/c.   Difficult to place patient No        Consultants:   None  Procedures:  None Antimicrobials:    Subjective: Patient this morning denies chest pain, denies worsening dyspnea.  Objective: Vitals:   04/13/21 0327 04/13/21 1306 04/13/21 2125 04/14/21 0304  BP: 131/84 120/66 117/62 132/77  Pulse: 82 79 79 81  Resp: 18 18 18 15   Temp: 98.6 F (37 C) 98.1 F (36.7 C) 98.5 F (36.9 C) 98.6 F (37 C)  TempSrc: Oral  Oral Oral  SpO2: 96% 97% 97% 97%    Intake/Output Summary (Last 24 hours)  at 04/14/2021 0806 Last data filed at 04/14/2021 0600 Gross per 24 hour  Intake 75 ml  Output 2100 ml  Net -2025 ml   There were no vitals filed for this visit.  Examination:  General exam: Appears calm and comfortable  Respiratory system: BL crackles.  Cardiovascular system: S1 & S2 heard, RRR. systolic Murmur Gastrointestinal system: Abdomen is nondistended, soft and nontender. No organomegaly or masses felt. Normal bowel sounds heard. Central nervous system: Alert . Extremities: Symmetric 5 x 5 power.    Data Reviewed: I have personally  reviewed following labs and imaging studies  CBC: Recent Labs  Lab 04/11/21 0217 04/14/21 0057  WBC 7.4 8.0  HGB 13.2 14.2  HCT 39.2 41.8  MCV 90.3 88.7  PLT 156 200   Basic Metabolic Panel: Recent Labs  Lab 04/11/21 0217 04/12/21 0158 04/13/21 0612 04/14/21 0057  NA 136 133* 134* 133*  K 2.9* 3.0* 3.2* 4.0  CL 96* 95* 99 98  CO2 30 31 27 26   GLUCOSE 89 95 84 98  BUN 9 11 6* 8  CREATININE 0.69 0.67 0.55 0.55  CALCIUM 9.1 8.8* 8.6* 8.7*  MG 1.5* 1.7  --   --   PHOS 2.8  --   --   --    GFR: CrCl cannot be calculated (Unknown ideal weight.). Liver Function Tests: Recent Labs  Lab 04/11/21 0217 04/12/21 0158  AST 26 25  ALT 14 13  ALKPHOS 38 36*  BILITOT 1.2 1.1  PROT 6.2* 5.9*  ALBUMIN 3.1* 2.9*   No results for input(s): LIPASE, AMYLASE in the last 168 hours. No results for input(s): AMMONIA in the last 168 hours. Coagulation Profile: No results for input(s): INR, PROTIME in the last 168 hours. Cardiac Enzymes: No results for input(s): CKTOTAL, CKMB, CKMBINDEX, TROPONINI in the last 168 hours. BNP (last 3 results) No results for input(s): PROBNP in the last 8760 hours. HbA1C: No results for input(s): HGBA1C in the last 72 hours. CBG: No results for input(s): GLUCAP in the last 168 hours. Lipid Profile: No results for input(s): CHOL, HDL, LDLCALC, TRIG, CHOLHDL, LDLDIRECT in the last 72 hours. Thyroid Function Tests: No results for input(s): TSH, T4TOTAL, FREET4, T3FREE, THYROIDAB in the last 72 hours. Anemia Panel: No results for input(s): VITAMINB12, FOLATE, FERRITIN, TIBC, IRON, RETICCTPCT in the last 72 hours. Sepsis Labs: Recent Labs  Lab 04/11/21 0217  PROCALCITON <0.10    Recent Results (from the past 240 hour(s))  SARS CORONAVIRUS 2 (TAT 6-24 HRS) Nasopharyngeal Nasopharyngeal Swab     Status: None   Collection Time: 04/11/21  1:08 AM   Specimen: Nasopharyngeal Swab  Result Value Ref Range Status   SARS Coronavirus 2 NEGATIVE NEGATIVE  Final    Comment: (NOTE) SARS-CoV-2 target nucleic acids are NOT DETECTED.  The SARS-CoV-2 RNA is generally detectable in upper and lower respiratory specimens during the acute phase of infection. Negative results do not preclude SARS-CoV-2 infection, do not rule out co-infections with other pathogens, and should not be used as the sole basis for treatment or other patient management decisions. Negative results must be combined with clinical observations, patient history, and epidemiological information. The expected result is Negative.  Fact Sheet for Patients: HairSlick.no  Fact Sheet for Healthcare Providers: quierodirigir.com  This test is not yet approved or cleared by the [place] FDA and  has been authorized for detection and/or diagnosis of SARS-CoV-2 by FDA under an Emergency Use Authorization (EUA). This EUA will remain  in effect (  meaning this test can be used) for the duration of the COVID-19 declaration under Se ction 564(b)(1) of the Act, 21 U.S.C. section 360bbb-3(b)(1), unless the authorization is terminated or revoked sooner.  Performed at [facility], [address], [place], [place] [address]          Radiology Studies: DG CHEST PORT 1 VIEW  Result Date: 04/13/2021 CLINICAL DATA:  Hypoxemia. EXAM: PORTABLE CHEST 1 VIEW COMPARISON:  One-view chest x-ray 04/11/2021. FINDINGS: Heart is enlarged. Lung volumes are low. Elevated left hemidiaphragm with gastric bubble noted. Increasing pulmonary vascular congestion and edema present. IMPRESSION: 1. Cardiomegaly with increasing pulmonary vascular congestion and edema compatible with congestive heart failure. 2. Low lung volumes. Electronically Signed   By: Marin Roberts M.D.   On: 04/13/2021 11:01        Scheduled Meds: . acidophilus  1 capsule Oral Daily  . citalopram  20 mg Oral Daily  . enoxaparin (LOVENOX) injection  40 mg  Subcutaneous Daily  . gatifloxacin  1 drop Right Eye QID  . levothyroxine  50 mcg Oral Q0600  . pantoprazole  40 mg Oral Daily  . prednisoLONE acetate  1 drop Right Eye QID   Continuous Infusions:   LOS: 1 day    Time spent: 35 minutes    Tijuana Scheidegger A Laddie Naeem, MD [facility]   If 7PM-7AM, please contact night-coverage [email or web address]  04/14/2021, 8:06 AM

## 2021-04-15 ENCOUNTER — Telehealth: Payer: Self-pay | Admitting: *Deleted

## 2021-04-15 LAB — CBC
HCT: 42.9 % (ref 36.0–46.0)
Hemoglobin: 14.4 g/dL (ref 12.0–15.0)
MCH: 30.1 pg (ref 26.0–34.0)
MCHC: 33.6 g/dL (ref 30.0–36.0)
MCV: 89.6 fL (ref 80.0–100.0)
Platelets: 212 10*3/uL (ref 150–400)
RBC: 4.79 MIL/uL (ref 3.87–5.11)
RDW: 13.2 % (ref 11.5–15.5)
WBC: 8 10*3/uL (ref 4.0–10.5)
nRBC: 0 % (ref 0.0–0.2)

## 2021-04-15 LAB — BASIC METABOLIC PANEL
Anion gap: 9 (ref 5–15)
BUN: 10 mg/dL (ref 8–23)
CO2: 29 mmol/L (ref 22–32)
Calcium: 8.9 mg/dL (ref 8.9–10.3)
Chloride: 97 mmol/L — ABNORMAL LOW (ref 98–111)
Creatinine, Ser: 0.58 mg/dL (ref 0.44–1.00)
GFR, Estimated: >60 mL/min (ref >60)
Glucose, Bld: 95 mg/dL (ref 70–99)
Potassium: 3.9 mmol/L (ref 3.5–5.1)
Sodium: 135 mmol/L (ref 135–145)

## 2021-04-15 LAB — MAGNESIUM: Magnesium: 2 mg/dL (ref 1.7–2.4)

## 2021-04-15 MED ORDER — METOPROLOL TARTRATE 25 MG PO TABS: 12.5000 mg | ORAL_TABLET | Freq: Two times a day (BID) | ORAL | 0 refills | Status: DC

## 2021-04-15 MED ORDER — SENNOSIDES-DOCUSATE SODIUM 8.6-50 MG PO TABS
1.0000 | ORAL_TABLET | Freq: Every day | ORAL | Status: DC
  Administered 2021-04-15 – 2021-04-16 (×2): 1 via ORAL
  Filled 2021-04-15 (×2): qty 1

## 2021-04-15 MED ORDER — SENNOSIDES-DOCUSATE SODIUM 8.6-50 MG PO TABS: 1.0000 | ORAL_TABLET | Freq: Every day | ORAL | 0 refills | Status: DC

## 2021-04-15 MED ORDER — FUROSEMIDE 20 MG PO TABS: 20.0000 mg | ORAL_TABLET | Freq: Every day | ORAL | 1 refills | Status: DC

## 2021-04-15 MED ORDER — FUROSEMIDE 20 MG PO TABS
20.0000 mg | ORAL_TABLET | Freq: Every day | ORAL | Status: DC
  Administered 2021-04-15 – 2021-04-16 (×2): 20 mg via ORAL
  Filled 2021-04-15 (×2): qty 1

## 2021-04-15 MED ORDER — CARVEDILOL 3.125 MG PO TABS: 3.1250 mg | ORAL_TABLET | Freq: Two times a day (BID) | ORAL | Status: DC

## 2021-04-15 MED ORDER — METOPROLOL TARTRATE 12.5 MG HALF TABLET
12.5000 mg | ORAL_TABLET | Freq: Two times a day (BID) | ORAL | Status: DC
  Administered 2021-04-15 – 2021-04-16 (×3): 12.5 mg via ORAL
  Filled 2021-04-15 (×3): qty 1

## 2021-04-15 MED ORDER — CARVEDILOL 3.125 MG PO TABS: 3.1250 mg | ORAL_TABLET | Freq: Two times a day (BID) | ORAL | 0 refills | Status: DC

## 2021-04-15 MED ORDER — POTASSIUM CHLORIDE CRYS ER 20 MEQ PO TBCR
20.0000 meq | EXTENDED_RELEASE_TABLET | Freq: Every day | ORAL | Status: DC
  Administered 2021-04-15 – 2021-04-16 (×2): 20 meq via ORAL
  Filled 2021-04-15 (×2): qty 1

## 2021-04-15 NOTE — Progress Notes (Signed)
Physical Therapy Treatment Patient Details Name: Alyssa Schmitt MRN: [id number] DOB: [birth year] Today's Date: 04/15/2021    History of Present Illness The pt is a 87 yo female presenting 5/6 s/p cataract surgery with acute hypoxia and hypotension. PMH includes: HTN, hypothyroidism, COPD, and obesity.    PT Comments    Pt progressing towards her physical therapy goals; demonstrates improved pain control today and mobility. Pt requiring two person mod-max assist for bed mobility and transfers out of bed to the chair using a walker. Demonstrates posterior lean, generalized weakness, kyphotic posture, and decreased activity tolerance. SpO2 93-94% on RA, HR 118-129 bpm. Continue to recommend SNF for ongoing Physical Therapy.      Follow Up Recommendations  SNF     Equipment Recommendations  Wheelchair (measurements PT);Wheelchair cushion (measurements PT);Hospital bed (hoyer lift)    Recommendations for Other Services       Precautions / Restrictions Precautions Precautions: Fall Restrictions Weight Bearing Restrictions: No    Mobility  Bed Mobility Overal bed mobility: Needs Assistance Bed Mobility: Supine to Sit     Supine to sit: Max assist;+2 for physical assistance     General bed mobility comments: MaxA + 2, use of bed pad to scoot hips, assist for BLE's and trunk to edge of bed    Transfers Overall transfer level: Needs assistance Equipment used: Rolling walker (2 wheeled) Transfers: Sit to/from [facility] Sit to Stand: Mod assist;+2 physical assistance Stand pivot transfers: Max assist;+2 physical assistance       General transfer comment: Mod-maxA + 2 to rise from edge of bed and take pivotal steps towards left to chair. Posterior lean, kyphotic posture, RLE externally rotated at baseline. Cues for sequencing, direction, stepping initiation.  Ambulation/Gait                 Stairs             Wheelchair Mobility     Modified Rankin (Stroke Patients Only)       Balance Overall balance assessment: Needs assistance Sitting-balance support: Single extremity supported;Feet supported Sitting balance-Leahy Scale: Poor Sitting balance - Comments: Requiring supervision-min guard assist, kyphotic posture, intermittent posterior lean Postural control: Posterior lean                                  Cognition Arousal/Alertness: Awake/alert Behavior During Therapy: WFL for tasks assessed/performed Overall Cognitive Status: Impaired/Different from baseline Area of Impairment: Memory;Safety/judgement;Problem solving                     Memory: Decreased short-term memory   Safety/Judgement: Decreased awareness of safety   Problem Solving: Slow processing;Requires verbal cues;Requires tactile cues;Difficulty sequencing General Comments: pt benefits from simple cues with increased time to follow.      Exercises General Exercises - Lower Extremity Long Arc Quad: Both;Seated;10 reps    General Comments        Pertinent Vitals/Pain Pain Assessment: Faces Faces Pain Scale: Hurts little more Pain Location: generalized Pain Descriptors / Indicators: Discomfort;Sore Pain Intervention(s): Limited activity within patient's tolerance;Monitored during session    Home Living                      Prior Function            PT Goals (current goals can now be found in the care plan section) Acute Rehab PT Goals Patient Stated  Goal: daughter: return home PT Goal Formulation: With patient/family Time For Goal Achievement: 04/25/21 Potential to Achieve Goals: Good Progress towards PT goals: Progressing toward goals    Frequency    Min 2X/week      PT Plan Frequency needs to be updated    Co-evaluation PT/OT/SLP Co-Evaluation/Treatment: Yes Reason for Co-Treatment: For patient/therapist safety;To address functional/ADL transfers PT goals addressed during  session: Mobility/safety with mobility        AM-PAC PT "6 Clicks" Mobility   Outcome Measure  Help needed turning from your back to your side while in a flat bed without using bedrails?: A Lot Help needed moving from lying on your back to sitting on the side of a flat bed without using bedrails?: Total Help needed moving to and from a bed to a chair (including a wheelchair)?: Total Help needed standing up from a chair using your arms (e.g., wheelchair or bedside chair)?: Total Help needed to walk in hospital room?: Total Help needed climbing 3-5 steps with a railing? : Total 6 Click Score: 7    End of Session Equipment Utilized During Treatment: Gait belt Activity Tolerance: Patient tolerated treatment well Patient left: with call bell/phone within reach;in bed;with bed alarm set Nurse Communication: Mobility status PT Visit Diagnosis: Other abnormalities of gait and mobility (R26.89);Muscle weakness (generalized) (M62.81)     Time: 0355-9741 PT Time Calculation (min) (ACUTE ONLY): 34 min  Charges:  $Therapeutic Activity: 8-22 mins                     Wyona Almas, PT, DPT Acute Rehabilitation Services Pager [phone] Office [place] 04/15/2021, 11:16 AM

## 2021-04-15 NOTE — TOC Progression Note (Signed)
Transition of Care [facility]) - Progression Note    Patient Details  Name: Alyssa Schmitt MRN: [id number] Date of Birth: 01/21/1928  Transition of Care [facility]) CM/SW [place], Person Phone Number: 04/15/2021, 4:42 PM  Clinical Narrative:     CSW met with pt and pt daughter bedside. Daughter chooses [facility] but specifies that she wants a private room. Pt is covid vaccinated x3. CSW called [facility] who stated they could accept pt on 04/17/21 and would have a private room. CSW informed [place] liaison that pt is medically stable and ready for d/c on 5/11. Liaison to talk with her supervisor and call back.   1555: [facility] called CSW back. Confirmed they can accept pt tomorrow, 5/11, with private room. CSW started SNF auth; [id number]. Requested covid test.     Expected Discharge Plan: Skilled Nursing Facility Barriers to Discharge: Continued Medical Work up  Expected Discharge Plan and Services Expected Discharge Plan: [place] arrangements for the past 2 months: Single Family Home Expected Discharge Date: 04/12/21                                     Social Determinants of Health (SDOH) Interventions    Readmission Risk Interventions No flowsheet data found.

## 2021-04-15 NOTE — Progress Notes (Signed)
   04/15/21 1328  Assess: MEWS Score  Temp 99.4 F (37.4 C)  BP 139/89  Pulse Rate (!) 118  Resp 17  Level of Consciousness Alert  SpO2 93 %  O2 Device Room Air  Assess: MEWS Score  MEWS Temp 0  MEWS Systolic 0  MEWS Pulse 2  MEWS RR 0  MEWS LOC 0  MEWS Score 2  MEWS Score Color Yellow  Assess: if the MEWS score is Yellow or Red  Were vital signs taken at a resting state? Yes  Focused Assessment No change from prior assessment  Early Detection of Sepsis Score *See Row Information* Low  MEWS guidelines implemented *See Row Information* Yes  Treat  MEWS Interventions Administered scheduled meds/treatments  Pain Scale 0-10  Pain Score 0  Take Vital Signs  Increase Vital Sign Frequency  Yellow: Q 2hr X 2 then Q 4hr X 2, if remains yellow, continue Q 4hrs  Escalate  MEWS: Escalate Yellow: discuss with charge nurse/RN and consider discussing with provider and RRT  Notify: Charge Nurse/RN  Name of Charge Nurse/RN Notified Ria Comment RN  Date Charge Nurse/RN Notified 04/15/21  Time Charge Nurse/RN Notified 1340  Notify: Provider  Provider Name/Title [place]  Date Provider Notified 04/15/21  Time Provider Notified 1335  Notification Type Call  Notification Reason Other (Comment) (elevated HR)  Provider response See new orders  Date of Provider Response 04/15/21  Time of Provider Response 1335  Document  Patient Outcome Stabilized after interventions  Progress note created (see row info) Yes

## 2021-04-15 NOTE — Progress Notes (Signed)
Occupational Therapy Treatment Patient Details Name: Alyssa Schmitt MRN: [id number] DOB: May 26, 1928 Today's Date: 04/15/2021    History of present illness The pt is a 87 yo female presenting 5/6 s/p cataract surgery with acute hypoxia and hypotension. PMH includes: HTN, hypothyroidism, COPD, and obesity.   OT comments  Pt is demonstrating imroved pain control today, allowing her to progress towards OT goals and functional mobility. Pt required max assist +2 for for OOB transfers for safety and mod-max assist for bed mobility. Pt used a RW for all OOB activities. Additionally, pt demonstrated improved sitting balance, by completing grooming while sitting EOB, with intermittent no UE support. OT will continue to follow and assist pt in progressing functional mobility and ADL performance.    Follow Up Recommendations  SNF;Supervision/Assistance - 24 hour    Equipment Recommendations  Other (comment) (hoyer lift for home)    Recommendations for Other Services      Precautions / Restrictions Precautions Precautions: Fall Precaution Comments: watch O2 Restrictions Weight Bearing Restrictions: No       Mobility Bed Mobility Overal bed mobility: Needs Assistance Bed Mobility: Supine to Sit     Supine to sit: Max assist;+2 for physical assistance     General bed mobility comments: MaxA + 2, use of bed pad to scoot hips, assist for BLE's and trunk to edge of bed    Transfers Overall transfer level: Needs assistance Equipment used: Rolling walker (2 wheeled) Transfers: Sit to/from [employer] Sit to Stand: Mod assist;+2 physical assistance Stand pivot transfers: Max assist;+2 physical assistance       General transfer comment: Mod-maxA + 2 to rise from edge of bed and take pivotal steps towards left to chair. Posterior lean, kyphotic posture, RLE externally rotated at baseline. Cues for sequencing, direction, stepping initiation.    Balance Overall balance  assessment: Needs assistance Sitting-balance support: Single extremity supported;Feet supported Sitting balance-Leahy Scale: Poor Sitting balance - Comments: Requiring supervision-min guard assist, kyphotic posture, intermittent posterior lean Postural control: Posterior lean Standing balance support: Bilateral upper extremity supported Standing balance-Leahy Scale: Poor Standing balance comment: maxA with BUE support                           ADL either performed or assessed with clinical judgement   ADL Overall ADL's : Needs assistance/impaired Eating/Feeding: Set up;Supervision/ safety;Sitting Eating/Feeding Details (indicate cue type and reason): able to bring her cup to her mouth once set in front of her. Grooming: Set up;Supervision/safety;Sitting Grooming Details (indicate cue type and reason): completed face and hand washing sitting EOB                 Toilet Transfer: Maximal assistance;+2 for physical assistance;Stand-pivot;RW Toilet Transfer Details (indicate cue type and reason): simulated with transfer to recliner, pt needs assist pushing up to stand and remain standing while standing and pivotting to seat         Functional mobility during ADLs: Maximal assistance;+2 for physical assistance General ADL Comments: Pt presenting with decreased balance, strength, and activity tolerance due to pain at BLEs, back, and neck     Vision       Perception     Praxis      Cognition Arousal/Alertness: Awake/alert Behavior During Therapy: WFL for tasks assessed/performed Overall Cognitive Status: Impaired/Different from baseline Area of Impairment: Memory;Safety/judgement;Problem solving  Memory: Decreased short-term memory   Safety/Judgement: Decreased awareness of safety   Problem Solving: Slow processing;Requires verbal cues;Requires tactile cues;Difficulty sequencing General Comments: pt benefits from simple cues with  increased time to follow.        Exercises Exercises: General Lower Extremity General Exercises - Lower Extremity Long Arc Quad: Both;Seated;10 reps   Shoulder Instructions       General Comments VSS on RA, O2 remaining ~93-94%    Pertinent Vitals/ Pain       Pain Assessment: Faces Faces Pain Scale: Hurts little more Pain Location: generalized Pain Descriptors / Indicators: Discomfort;Sore Pain Intervention(s): Limited activity within patient's tolerance;Monitored during session  Home Living                                          Prior Functioning/Environment              Frequency  Min 2X/week        Progress Toward Goals  OT Goals(current goals can now be found in the care plan section)  Progress towards OT goals: Progressing toward goals  Acute Rehab OT Goals Patient Stated Goal: daughter: return home OT Goal Formulation: With patient/family Time For Goal Achievement: 04/25/21 Potential to Achieve Goals: Good ADL Goals Pt Will Perform Upper Body Bathing: with min assist;sitting Pt Will Transfer to Toilet: with min assist;with +2 assist;stand pivot transfer;bedside commode Additional ADL Goal #1: Pt will perform bed mobility with Mod A in preparation for ADLs  Plan Discharge plan remains appropriate;Frequency remains appropriate    Co-evaluation    PT/OT/SLP Co-Evaluation/Treatment: Yes Reason for Co-Treatment: For patient/therapist safety;To address functional/ADL transfers PT goals addressed during session: Mobility/safety with mobility;Balance;Proper use of DME OT goals addressed during session: ADL's and self-care;Proper use of Adaptive equipment and DME      AM-PAC OT "6 Clicks" Daily Activity     Outcome Measure   Help from another person eating meals?: A Little Help from another person taking care of personal grooming?: A Little Help from another person toileting, which includes using toliet, bedpan, or urinal?: A  Lot Help from another person bathing (including washing, rinsing, drying)?: A Lot Help from another person to put on and taking off regular upper body clothing?: A Little Help from another person to put on and taking off regular lower body clothing?: A Lot 6 Click Score: 15    End of Session Equipment Utilized During Treatment: Gait belt;Rolling walker  OT Visit Diagnosis: Unsteadiness on feet (R26.81);Other abnormalities of gait and mobility (R26.89);Muscle weakness (generalized) (M62.81)   Activity Tolerance Patient tolerated treatment well;Patient limited by pain   Patient Left in chair;with call bell/phone within reach;with chair alarm set;with family/visitor present   Nurse Communication Mobility status        Time: 2130-8657 OT Time Calculation (min): 25 min  Charges: OT General Charges $OT Visit: 1 Visit OT Treatments $Self Care/Home Management : 8-22 mins  Tameisha Covell H., OTR/L Acute Rehabilitation  Khyre Germond Elane Mykale Gandolfo 04/15/2021, 11:50 AM

## 2021-04-15 NOTE — Discharge Summary (Addendum)
Physician Discharge Summary  Alyssa Schmitt [id number] DOB: Jul 12, 1928 DOA: 04/11/2021  PCP: Unk Pinto, MD  Admit date: 04/11/2021 Discharge date: 04/15/2021  Admitted From: Home  Disposition:  SNF  Recommendations for Outpatient Follow-up:  1. Follow up with PCP in 1-2 weeks 2. Please obtain BMP/CBC in one week 3. Needs to follow up with ophthalmology post Cataract Sx.  4. Monitor weight.     Discharge Condition: Stable.  CODE STATUS: DNR Diet recommendation: Heart Healthy   Brief/Interim Summary: Alyssa Newitt Breedenis a 87 y.o.femalewith medical history significant foressential hypertension, hypothyroidism, cataract, chronic anxiety who presented initially at outside facility sent from perioperative services due to acute hypoxia and low blood pressurepost op.Patient improved a little bit on 5/6,she was scheduled for discharge on 5/7 however she was felt to be extremely weak, more so than her baseline and discharge has been canceled.  She was admitted with Acute on Chronic diastolic Heart failure, chest x ray showed pulmonary edema, she is requiring 2 L oxygen. She is receiving IV lasix.    1-Acute Hypoxic Respiratory Failure secondary to acute on chronic diastolic HF exacerbation;  Post op out side facility develops hypoxia and hypotension.  Chest x ray: cardiomegaly with increase pulmonary vascular congestion.  Urine out put: 2.1 L.  Patient received  IV lasix 5/08, 5/09. Transition to oral Lasix.  On room air now.   2-Moderate Aortic stenosis; ECHO per notes from outside facility showed Ef 55 to 60 %mild AS.  Treating for HF exacerbation.   3-ST Changes on Telemetry:  EKG no ST elevation present. Patient chest pain free. Troponin not significantly elevated.  Replaced magnesium. Discussed with cardiology, no ST change on EKG, patient asymptomatic.   4-Hypomagnesemia; replete IV> mg normalized. Continue with oral magnesium at discharge.    5-Generalized fatigue, weakness; PT recommend SNF>   6-Low back pain:  continue with tylenol/   7-Right eye Cataract; recently had Sx. Out patient follow up/   8-Hypothyroidism; Continue with synthroid.   9-Sinus tachycardia; after exertion; Monitor on telemetry. HB check was normal. If persist could consider low dose BB>  Started low dose metoprolol.  Holding Buspar and celexa due to prolong QT.   Pressure Injury 04/11/21 Buttocks Stage 1 -  Intact skin with non-blanchable redness of a localized area usually over a bony prominence. (Active)  04/11/21 0030  Location: Buttocks  Location Orientation:   Staging: Stage 1 -  Intact skin with non-blanchable redness of a localized area usually over a bony prominence.  Wound Description (Comments):   Present on Admission: Yes      Discharge Diagnoses:  Active Problems:   Acute respiratory failure with hypoxia (HCC)   Pressure injury of skin   Hypoxemia    Discharge Instructions   Allergies as of 04/15/2021      Reactions   Aspirin Swelling   Celebrex [celecoxib] Other (See Comments)   "my body lit up"   Fosamax [alendronate Sodium] Other (See Comments)   unknown   Keflex [cephalexin] Other (See Comments)   unknown   Penicillins Other (See Comments)   unknown   Prednisone Itching   Prevacid [lansoprazole] Other (See Comments)   unknown   Sulfa Antibiotics Other (See Comments)   unknown   Tetanus Toxoids Other (See Comments)   unknown   Tetracyclines & Related Nausea Only         Medication List    STOP taking these medications   busPIRone 10 MG tablet Commonly known as: BUSPAR  citalopram 20 MG tablet Commonly known as: CELEXA   hydrochlorothiazide 25 MG tablet Commonly known as: HYDRODIURIL   nitrofurantoin (macrocrystal-monohydrate) 100 MG capsule Commonly known as: Macrobid     TAKE these medications   acetaminophen 325 MG tablet Commonly known as: TYLENOL Take 2 tablets (650 mg  total) by mouth every 6 (six) hours as needed (prn TEMP>38.1 Celsius).   calcium-vitamin D 250-125 MG-UNIT tablet Commonly known as: OSCAL WITH D Take 1 tablet by mouth daily.   D-Mannose 500 MG Caps Take 1 tablet by mouth daily.   fexofenadine 180 MG tablet Commonly known as: ALLEGRA Take 180 mg by mouth daily.   furosemide 20 MG tablet Commonly known as: LASIX Take 1 tablet (20 mg total) by mouth daily.   levothyroxine 50 MCG tablet Commonly known as: SYNTHROID TAKE 1 TABLET DAILY ON AN EMPTY STOMACH WITH ONLY WATER FOR 30 MINUTES & NO ANTACID MEDS, CALCIUM OR MAGNESIUM FOR 4 HOURS & AVOID BIOTIN What changed: See the new instructions.   magnesium oxide 400 (241.3 Mg) MG tablet Commonly known as: MAG-OX Take 1 tablet (400 mg total) by mouth 2 (two) times daily. What changed: additional instructions   metoprolol tartrate 25 MG tablet Commonly known as: LOPRESSOR Take 0.5 tablets (12.5 mg total) by mouth 2 (two) times daily.   multivitamin with minerals Tabs tablet Take 1 tablet by mouth daily.   nystatin powder Commonly known as: MYCOSTATIN/NYSTOP Apply daily after a shower as needed What changed:   how much to take  how to take this  when to take this   omeprazole 40 MG capsule Commonly known as: PRILOSEC TAKE 1 CAPSULE BY MOUTH EVERY DAY What changed: how much to take   potassium chloride SA 20 MEQ tablet Commonly known as: KLOR-CON Take 1 tablet (20 mEq total) by mouth daily.   PROBIOTIC FORMULA PO Take 1 tablet by mouth daily.   senna-docusate 8.6-50 MG tablet Commonly known as: Senokot-S Take 1 tablet by mouth daily.   terazosin 10 MG capsule Commonly known as: HYTRIN TAKE 1 TABLET AT BEDTIME FOR BP What changed: See the new instructions.   Vitamin D3 50 MCG (2000 UT) Tabs Take 4 tablets by mouth daily.   zinc gluconate 50 MG tablet Take 50 mg by mouth daily.       Follow-up Information    Lucky Cowboy, MD Follow up in 1 week(s).    Specialty: Internal Medicine Contact information: [address] [place] [address] [phone]              Allergies  Allergen Reactions  . Aspirin Swelling  . Celebrex [Celecoxib] Other (See Comments)    "my body lit up"  . Fosamax [Alendronate Sodium] Other (See Comments)    unknown  . Keflex [Cephalexin] Other (See Comments)    unknown  . Penicillins Other (See Comments)    unknown  . Prednisone Itching  . Prevacid [Lansoprazole] Other (See Comments)    unknown  . Sulfa Antibiotics Other (See Comments)    unknown  . Tetanus Toxoids Other (See Comments)    unknown  . Tetracyclines & Related Nausea Only         Consultations:  None   Procedures/Studies: DG CHEST PORT 1 VIEW  Result Date: 04/13/2021 CLINICAL DATA:  Hypoxemia. EXAM: PORTABLE CHEST 1 VIEW COMPARISON:  One-view chest x-ray 04/11/2021. FINDINGS: Heart is enlarged. Lung volumes are low. Elevated left hemidiaphragm with gastric bubble noted. Increasing pulmonary vascular congestion and edema present. IMPRESSION:  1. Cardiomegaly with increasing pulmonary vascular congestion and edema compatible with congestive heart failure. 2. Low lung volumes. Electronically Signed   By: San Morelle M.D.   On: 04/13/2021 11:01   DG CHEST PORT 1 VIEW  Result Date: 04/11/2021 CLINICAL DATA:  Acute respiratory failure with hypoxia EXAM: PORTABLE CHEST 1 VIEW COMPARISON:  06/22/2018 FINDINGS: Cardiomegaly. Large hiatal hernia was also seen in 2019. Low volume chest. Hazy density behind the heart and streaky density at the right base. Trace pleural fluid is possible. No pneumothorax. IMPRESSION: 1. Limited low volume chest. 2. Retrocardiac opacification that could be atelectasis or infection. 3. Large hiatal hernia. Electronically Signed   By: Monte Fantasia M.D.   On: 04/11/2021 04:11    Subjective: Patient is sleepy.  Does not interact much but remains fairly comfortable and says okay to  everything.  Discharge Exam: Vitals:   04/15/21 0542 04/15/21 1328  BP: (!) 144/71 139/89  Pulse: 80 (!) 118  Resp: 19 17  Temp: 98.9 F (37.2 C) 99.4 F (37.4 C)  SpO2: 97% 93%     General: Pt is alert, awake, not in acute distress Right eye with patch on. Cardiovascular: RRR, S1/S2 +, no rubs, no gallops Respiratory: CTA bilaterally, no wheezing, no rhonchi Abdominal: Soft, NT, ND, bowel sounds + Extremities: no edema, no cyanosis    The results of significant diagnostics from this hospitalization (including imaging, microbiology, ancillary and laboratory) are listed below for reference.     Microbiology: Recent Results (from the past 240 hour(s))  SARS CORONAVIRUS 2 (TAT 6-24 HRS) Nasopharyngeal Nasopharyngeal Swab     Status: None   Collection Time: 04/11/21  1:08 AM   Specimen: Nasopharyngeal Swab  Result Value Ref Range Status   SARS Coronavirus 2 NEGATIVE NEGATIVE Final    Comment: (NOTE) SARS-CoV-2 target nucleic acids are NOT DETECTED.  The SARS-CoV-2 RNA is generally detectable in upper and lower respiratory specimens during the acute phase of infection. Negative results do not preclude SARS-CoV-2 infection, do not rule out co-infections with other pathogens, and should not be used as the sole basis for treatment or other patient management decisions. Negative results must be combined with clinical observations, patient history, and epidemiological information. The expected result is Negative.  Fact Sheet for Patients: SugarRoll.be  Fact Sheet for Healthcare Providers: https://www.woods-mathews.com/  This test is not yet approved or cleared by the [place] FDA and  has been authorized for detection and/or diagnosis of SARS-CoV-2 by FDA under an Emergency Use Authorization (EUA). This EUA will remain  in effect (meaning this test can be used) for the duration of the COVID-19 declaration under Se ction  564(b)(1) of the Act, 21 U.S.C. section 360bbb-3(b)(1), unless the authorization is terminated or revoked sooner.  Performed at [facility], [place] [address] [place], [place] [address]      Labs: BNP (last 3 results) No results for input(s): BNP in the last 8760 hours. Basic Metabolic Panel: Recent Labs  Lab 04/11/21 0217 04/12/21 0158 04/13/21 0612 04/14/21 0057 04/14/21 1102 04/15/21 0217  NA 136 133* 134* 133* 135 135  K 2.9* 3.0* 3.2* 4.0 3.8 3.9  CL 96* 95* 99 98 99 97*  CO2 30 31 27 26 26 29   GLUCOSE 89 95 84 98 105* 95  BUN 9 11 6* 8 9 10   CREATININE 0.69 0.67 0.55 0.55 0.61 0.58  CALCIUM 9.1 8.8* 8.6* 8.7* 9.0 8.9  MG 1.5* 1.7  --   --  1.4*  2.0  PHOS 2.8  --   --   --   --   --    Liver Function Tests: Recent Labs  Lab 04/11/21 0217 04/12/21 0158  AST 26 25  ALT 14 13  ALKPHOS 38 36*  BILITOT 1.2 1.1  PROT 6.2* 5.9*  ALBUMIN 3.1* 2.9*   No results for input(s): LIPASE, AMYLASE in the last 168 hours. No results for input(s): AMMONIA in the last 168 hours. CBC: Recent Labs  Lab 04/11/21 0217 04/14/21 0057 04/15/21 1152  WBC 7.4 8.0 8.0  HGB 13.2 14.2 14.4  HCT 39.2 41.8 42.9  MCV 90.3 88.7 89.6  PLT 156 200 212   Cardiac Enzymes: No results for input(s): CKTOTAL, CKMB, CKMBINDEX, TROPONINI in the last 168 hours. BNP: Invalid input(s): POCBNP CBG: Recent Labs  Lab 04/14/21 1528  GLUCAP 100*   D-Dimer No results for input(s): DDIMER in the last 72 hours. Hgb A1c No results for input(s): HGBA1C in the last 72 hours. Lipid Profile No results for input(s): CHOL, HDL, LDLCALC, TRIG, CHOLHDL, LDLDIRECT in the last 72 hours. Thyroid function studies No results for input(s): TSH, T4TOTAL, T3FREE, THYROIDAB in the last 72 hours.  Invalid input(s): FREET3 Anemia work up No results for input(s): VITAMINB12, FOLATE, FERRITIN, TIBC, IRON, RETICCTPCT in the last 72 hours. Urinalysis    Component Value Date/Time   COLORURINE DARK YELLOW  03/06/2021 1354   APPEARANCEUR TURBID (A) 03/06/2021 1354   LABSPEC 1.016 03/06/2021 1354   PHURINE 8.5 (H) 03/06/2021 1354   GLUCOSEU NEGATIVE 03/06/2021 1354   HGBUR TRACE (A) 03/06/2021 1354   BILIRUBINUR NEGATIVE 10/06/2016 1530   KETONESUR NEGATIVE 03/06/2021 1354   PROTEINUR 1+ (A) 03/06/2021 1354   UROBILINOGEN 0.2 12/19/2014 1357   NITRITE POSITIVE (A) 03/06/2021 1354   LEUKOCYTESUR 3+ (A) 03/06/2021 1354   Sepsis Labs Invalid input(s): PROCALCITONIN,  WBC,  LACTICIDVEN Microbiology Recent Results (from the past 240 hour(s))  SARS CORONAVIRUS 2 (TAT 6-24 HRS) Nasopharyngeal Nasopharyngeal Swab     Status: None   Collection Time: 04/11/21  1:08 AM   Specimen: Nasopharyngeal Swab  Result Value Ref Range Status   SARS Coronavirus 2 NEGATIVE NEGATIVE Final    Comment: (NOTE) SARS-CoV-2 target nucleic acids are NOT DETECTED.  The SARS-CoV-2 RNA is generally detectable in upper and lower respiratory specimens during the acute phase of infection. Negative results do not preclude SARS-CoV-2 infection, do not rule out co-infections with other pathogens, and should not be used as the sole basis for treatment or other patient management decisions. Negative results must be combined with clinical observations, patient history, and epidemiological information. The expected result is Negative.  Fact Sheet for Patients: SugarRoll.be  Fact Sheet for Healthcare Providers: https://www.woods-mathews.com/  This test is not yet approved or cleared by the [place] FDA and  has been authorized for detection and/or diagnosis of SARS-CoV-2 by FDA under an Emergency Use Authorization (EUA). This EUA will remain  in effect (meaning this test can be used) for the duration of the COVID-19 declaration under Se ction 564(b)(1) of the Act, 21 U.S.C. section 360bbb-3(b)(1), unless the authorization is terminated or revoked sooner.  Performed at [facility], [place] [address], [place], [place] [address]      Time coordinating discharge: 40 minutes  SIGNED:   Elmarie Shiley, MD  [facility]  I have seen and examined patient.  No overnight events.  No change in medication and plans needed.  Stable to discharge to  a skilled nursing home today.

## 2021-04-15 NOTE — Telephone Encounter (Signed)
Called patient on 04/15/2021 , 5:06 PM in an attempt to reach the patient for a hospital follow up. Spoke with daughter, [name], daughter. Patient is currently in [facility] waiting for a rehab bed. Admit date: 05/01/16 Discharge: 05/01/16   She does not have any questions or concerns about medications from the hospital admission. The patient's medications were reviewed over the phone, they were counseled to bring in all current medications to the hospital follow up visit.   I advised the patient to call if any questions or concerns arise about the hospital admission or medications    Home health was not started in the hospital. Patient is going to a rehab center. All questions were answered and a follow up appointment was not made. Daughter agreed to call and schedule a follow up visit when patient is discharged from the rehab facility.  Prior to Admission medications   Medication Sig Start Date End Date Taking? Authorizing Provider  acetaminophen (TYLENOL) 325 MG tablet Take 2 tablets (650 mg total) by mouth every 6 (six) hours as needed (prn TEMP>38.1 Celsius). 12/21/14   Reyne Dumas, MD  busPIRone (BUSPAR) 10 MG tablet TAKE 1 TABLET 3 TIMES A DAY FOR CHRONIC ANXIETY Patient taking differently: Take 10 mg by mouth 2 (two) times daily. 08/14/20   Unk Pinto, MD  calcium-vitamin D (OSCAL WITH D) 250-125 MG-UNIT per tablet Take 1 tablet by mouth daily.    [provider]  Cholecalciferol (VITAMIN D3) 2000 UNITS TABS Take 4 tablets by mouth daily.     [provider]  citalopram (CELEXA) 20 MG tablet Take     1 tablet     Daily     For Mood      TAKE 1 TABLET BY MOUTH DAILY FOR MOOD Patient taking differently: Take 10 mg by mouth daily. 11/23/20   Unk Pinto, MD  D-Mannose 500 MG CAPS Take 1 tablet by mouth daily.    [provider]  fexofenadine (ALLEGRA) 180 MG tablet Take 180 mg by mouth daily.     [provider]  furosemide (LASIX) 20 MG tablet  Take 1 tablet (20 mg total) by mouth daily. 04/15/21   Regalado, Belkys A, MD  hydrochlorothiazide (HYDRODIURIL) 25 MG tablet TAKE 1 TABLET DAILY FOR BP & FLUID Patient taking differently: Take 25 mg by mouth daily. Take 1 tablet Daily for BP & Fluid 05/17/20   Unk Pinto, MD  levothyroxine (SYNTHROID) 50 MCG tablet TAKE 1 TABLET DAILY ON AN EMPTY STOMACH WITH ONLY WATER FOR 30 MINUTES & NO ANTACID MEDS, CALCIUM OR MAGNESIUM FOR 4 HOURS & AVOID BIOTIN Patient taking differently: Take 50 mcg by mouth daily before breakfast. Take 1 tablet daily on an empty stomach with only water for 30 minutes & no Antacid meds, Calcium or Magnesium for 4 hours & avoid Biotin 02/24/21   McClanahan, Danton Sewer, NP  magnesium oxide (MAG-OX) 400 (241.3 MG) MG tablet Take 1 tablet (400 mg total) by mouth 2 (two) times daily. Patient taking differently: No sig reported 12/21/14   Reyne Dumas, MD  metoprolol tartrate (LOPRESSOR) 25 MG tablet Take 0.5 tablets (12.5 mg total) by mouth 2 (two) times daily. 04/15/21   Regalado, Belkys A, MD  Multiple Vitamin (MULITIVITAMIN WITH MINERALS) TABS Take 1 tablet by mouth daily.    [provider]  nitrofurantoin, macrocrystal-monohydrate, (MACROBID) 100 MG capsule Take 1 capsule (100 mg total) by mouth 2 (two) times daily. 03/07/21   [name], NP  nystatin (MYCOSTATIN/NYSTOP) powder  Apply daily after a shower as needed Patient taking differently: Apply 1 application topically 2 (two) times daily. Apply daily after a shower as needed 01/13/21   [name], NP  omeprazole (PRILOSEC) 40 MG capsule TAKE 1 CAPSULE BY MOUTH EVERY DAY Patient taking differently: Take 40 mg by mouth daily. 04/11/20   [name], NP  potassium chloride SA (KLOR-CON) 20 MEQ tablet Take 1 tablet (20 mEq total) by mouth daily. 04/12/21   Caren Griffins, MD  Probiotic Product (PROBIOTIC FORMULA PO) Take 1 tablet by mouth daily.     [provider]  senna-docusate (SENOKOT-S) 8.6-50 MG  tablet Take 1 tablet by mouth daily. 04/15/21   Regalado, Belkys A, MD  terazosin (HYTRIN) 10 MG capsule TAKE 1 TABLET AT BEDTIME FOR BP Patient taking differently: Take 10 mg by mouth at bedtime. 10/16/20   [name], NP  zinc gluconate 50 MG tablet Take 50 mg by mouth daily.    [provider]

## 2021-04-15 NOTE — NC FL2 (Signed)
[place] MEDICAID FL2 LEVEL OF CARE SCREENING TOOL     IDENTIFICATION  Patient Name: Alyssa Schmitt Birthdate: 1928/09/01 Sex: female Admission Date (Current Location): 04/11/2021  [facility] and [place] Number:  Herbalist and Address:  The . [facility], [place] [address], [place], [place] [address]      Provider Number: [id number]  Attending Physician Name and Address:  Elmarie Shiley, MD  Relative Name and Phone Number:  [name] (Daughter)   [phone] [facility])    Current Level of Care: Hospital Recommended Level of Care: [place] Prior Approval Number:    Date Approved/Denied:   PASRR Number: [id number]  Discharge Plan: SNF    Current Diagnoses: Patient Active Problem List   Diagnosis Date Noted  . Hypoxemia 04/12/2021  . Acute respiratory failure with hypoxia ([place]) 04/11/2021  . Pressure injury of skin 04/11/2021  . Environmental allergies 09/26/2020  . Combined form of age-related cataract, left eye 09/10/2020  . Acquired female bladder prolapse 03/06/2020  . Pressure injury of buttock, stage 1 03/06/2020  . Tear of skin of right buttock 10/05/2018  . Intertriginous candidiasis 10/05/2018  . Elevated LFTs 08/26/2018  . Hypertensive retinopathy of both eyes 09/21/2017  . Depression, major, in remission ([place]) 06/22/2016  . COPD 08/15/2015  . GERD  08/15/2015  . Osteoporosis 07/16/2015  . Hypothyroidism   . Medication management 03/20/2014  . Essential hypertension   . Hyperlipidemia, mixed   . Abnormal glucose   . Vitamin D deficiency   . Varicose veins of bilateral lower extremities with other complications 86/76/1950    Orientation RESPIRATION BLADDER Height & Weight     Self,Place  Normal Incontinent,External catheter Weight:   Height:     BEHAVIORAL SYMPTOMS/MOOD NEUROLOGICAL BOWEL NUTRITION STATUS      Continent Diet (see d/c summary)  AMBULATORY STATUS COMMUNICATION OF NEEDS Skin    Extensive Assist Verbally PU Stage and Appropriate Care (Pressure Injury; buttocks stage 1)                       Personal Care Assistance Level of Assistance  Bathing,Feeding,Dressing Bathing Assistance: Maximum assistance Feeding assistance: Independent Dressing Assistance: Maximum assistance     Functional Limitations Info  Sight,Hearing,Speech Sight Info: Impaired Hearing Info: Adequate Speech Info: Adequate    SPECIAL CARE FACTORS FREQUENCY  PT (By licensed PT),OT (By licensed OT)     PT Frequency: 5x/week OT Frequency: 5x/week            Contractures Contractures Info: Not present    Additional Factors Info  Code Status,Allergies Code Status Info: DNR Allergies Info: Celebrex (celecoxib), aspirin,Fosamax (alendronate Sodium), penecillins,, Keflex (cephalexin), prednisone, sulfa antibiotics, Prevacid (lansoprazole), Tetracyclines & Related, tetanus toxoids           Current Medications (04/15/2021):  This is the current hospital active medication list Current Facility-Administered Medications  Medication Dose Route Frequency Provider Last Rate Last Admin  . acetaminophen (TYLENOL) tablet 650 mg  650 mg Oral Q6H PRN Kayleen Memos, DO   650 mg at 04/14/21 1200  . acidophilus (RISAQUAD) capsule 1 capsule  1 capsule Oral Daily Irene Pap N, DO   1 capsule at 04/15/21 1014  . enoxaparin (LOVENOX) injection 40 mg  40 mg Subcutaneous Daily Irene Pap N, DO   40 mg at 04/15/21 1015  . furosemide (LASIX) tablet 20 mg  20 mg Oral Daily Regalado, Belkys A, MD   20 mg at 04/15/21 1328  .  gatifloxacin (ZYMAXID) 0.5 % ophthalmic drops 1 drop  1 drop Right Eye QID Donnamae Jude, MD   1 drop at 04/15/21 1014  . levothyroxine (SYNTHROID) tablet 50 mcg  50 mcg Oral Q0600 Kayleen Memos, DO   50 mcg at 04/15/21 2355  . magnesium oxide (MAG-OX) tablet 400 mg  400 mg Oral BID Regalado, Belkys A, MD   400 mg at 04/15/21 1014  . melatonin tablet 3 mg  3 mg Oral QHS PRN Irene Pap N, DO      . metoprolol tartrate (LOPRESSOR) tablet 12.5 mg  12.5 mg Oral BID Regalado, Belkys A, MD   12.5 mg at 04/15/21 1434  . multivitamin with minerals tablet 1 tablet  1 tablet Oral Daily Regalado, Belkys A, MD   1 tablet at 04/15/21 1014  . pantoprazole (PROTONIX) EC tablet 40 mg  40 mg Oral Daily Irene Pap N, DO   40 mg at 04/15/21 1014  . potassium chloride SA (KLOR-CON) CR tablet 20 mEq  20 mEq Oral Daily Regalado, Belkys A, MD   20 mEq at 04/15/21 1322  . prednisoLONE acetate (PRED FORTE) 1 % ophthalmic suspension 1 drop  1 drop Right Eye QID Donnamae Jude, MD   1 drop at 04/15/21 1321  . prochlorperazine (COMPAZINE) injection 10 mg  10 mg Intravenous Q6H PRN Irene Pap N, DO      . senna-docusate (Senokot-S) tablet 1 tablet  1 tablet Oral Daily Regalado, Belkys A, MD   1 tablet at 04/15/21 1322     Discharge Medications: Please see discharge summary for a list of discharge medications.  Relevant Imaging Results:  Relevant Lab Results:   Additional Information SSN [id number] Pt is covid vaccinated x3  [place], LCSW

## 2021-04-16 DIAGNOSIS — J9690 Respiratory failure, unspecified, unspecified whether with hypoxia or hypercapnia: Secondary | ICD-10-CM | POA: Diagnosis not present

## 2021-04-16 DIAGNOSIS — J9601 Acute respiratory failure with hypoxia: Secondary | ICD-10-CM | POA: Diagnosis not present

## 2021-04-16 DIAGNOSIS — N8110 Cystocele, unspecified: Secondary | ICD-10-CM | POA: Diagnosis not present

## 2021-04-16 DIAGNOSIS — I479 Paroxysmal tachycardia, unspecified: Secondary | ICD-10-CM | POA: Diagnosis not present

## 2021-04-16 DIAGNOSIS — R498 Other voice and resonance disorders: Secondary | ICD-10-CM | POA: Diagnosis not present

## 2021-04-16 DIAGNOSIS — Z743 Need for continuous supervision: Secondary | ICD-10-CM | POA: Diagnosis not present

## 2021-04-16 DIAGNOSIS — I5033 Acute on chronic diastolic (congestive) heart failure: Secondary | ICD-10-CM | POA: Diagnosis not present

## 2021-04-16 DIAGNOSIS — R8290 Unspecified abnormal findings in urine: Secondary | ICD-10-CM | POA: Diagnosis not present

## 2021-04-16 DIAGNOSIS — R5383 Other fatigue: Secondary | ICD-10-CM | POA: Diagnosis not present

## 2021-04-16 DIAGNOSIS — N390 Urinary tract infection, site not specified: Secondary | ICD-10-CM | POA: Diagnosis not present

## 2021-04-16 DIAGNOSIS — R2689 Other abnormalities of gait and mobility: Secondary | ICD-10-CM | POA: Diagnosis not present

## 2021-04-16 DIAGNOSIS — M542 Cervicalgia: Secondary | ICD-10-CM | POA: Diagnosis not present

## 2021-04-16 DIAGNOSIS — I350 Nonrheumatic aortic (valve) stenosis: Secondary | ICD-10-CM | POA: Diagnosis not present

## 2021-04-16 DIAGNOSIS — E038 Other specified hypothyroidism: Secondary | ICD-10-CM | POA: Diagnosis not present

## 2021-04-16 DIAGNOSIS — R1312 Dysphagia, oropharyngeal phase: Secondary | ICD-10-CM | POA: Diagnosis not present

## 2021-04-16 DIAGNOSIS — R300 Dysuria: Secondary | ICD-10-CM | POA: Diagnosis not present

## 2021-04-16 DIAGNOSIS — R279 Unspecified lack of coordination: Secondary | ICD-10-CM | POA: Diagnosis not present

## 2021-04-16 DIAGNOSIS — Z9841 Cataract extraction status, right eye: Secondary | ICD-10-CM | POA: Diagnosis not present

## 2021-04-16 DIAGNOSIS — R9431 Abnormal electrocardiogram [ECG] [EKG]: Secondary | ICD-10-CM | POA: Diagnosis not present

## 2021-04-16 DIAGNOSIS — R2681 Unsteadiness on feet: Secondary | ICD-10-CM | POA: Diagnosis not present

## 2021-04-16 DIAGNOSIS — M6281 Muscle weakness (generalized): Secondary | ICD-10-CM | POA: Diagnosis not present

## 2021-04-16 DIAGNOSIS — H2620 Unspecified complicated cataract: Secondary | ICD-10-CM | POA: Diagnosis not present

## 2021-04-16 DIAGNOSIS — R0902 Hypoxemia: Secondary | ICD-10-CM | POA: Diagnosis not present

## 2021-04-16 LAB — RESP PANEL BY RT-PCR (FLU A&B, COVID) ARPGX2
Influenza A by PCR: NEGATIVE
Influenza B by PCR: NEGATIVE
SARS Coronavirus 2 by RT PCR: NEGATIVE

## 2021-04-16 NOTE — Progress Notes (Signed)
I reevaluated patient for discharge readiness.  No overnight events. Patient is a stable enough to transfer to skilled nursing facility today. Discharge summary reviewed and no changes needed.

## 2021-04-16 NOTE — TOC Transition Note (Signed)
Transition of Care [facility]) - CM/SW Discharge Note   Patient Details  Name: Alyssa Schmitt MRN: [id number] Date of Birth: December 21, 1927  [facility] CM/SW Contact:  Emeterio Reeve, [place] Phone Number: 04/16/2021, 4:09 PM   Clinical Narrative:     Patient will DC to: [place]  Anticipated DC date: 04/16/21 Family notified: Both daughters present in room Transport by: Corey Harold     Per MD patient ready for DC to [facility]. RN, patient, patient's family, and facility notified of DC. Discharge Summary and FL2 sent to facility.Pt is covid negative. Insurance Josem Kaufmann has been received. DC packet on chart. Ambulance transport requested for patient.    RN to call report to [phone]. Pt is going to room 603p.  CSW will sign off for now as social work intervention is no longer needed. Please consult [place] again if new needs arise.   Final next level of care: Skilled Nursing Facility Barriers to Discharge: Barriers Resolved   Patient Goals and CMS Choice Patient states their goals for this hospitalization and ongoing recovery are:: to move better CMS Medicare.gov Compare Post Acute Care list provided to:: Patient Choice offered to / list presented to : [place]  Discharge Placement              Patient chooses bed at: [facility] Patient to be transferred to facility by: ptar Name of family member notified: Both daughters present in room Patient and family notified of of transfer: 04/16/21  Discharge Plan and Services                                     Social Determinants of Health ([place]) Interventions     Readmission Risk Interventions No flowsheet data found.   Emeterio Reeve, Latanya Presser, [place] Social Worker [phone]

## 2021-04-16 NOTE — Progress Notes (Signed)
Report given to [place]

## 2021-04-16 NOTE — Progress Notes (Signed)
   04/15/21 2130  Assess: MEWS Score  Temp 98.6 F (37 C)  BP (!) 141/89  Pulse Rate (!) 116  Resp 19  Level of Consciousness Alert  SpO2 95 %  O2 Device Room Air  Assess: MEWS Score  MEWS Temp 0  MEWS Systolic 0  MEWS Pulse 2  MEWS RR 0  MEWS LOC 0  MEWS Score 2  MEWS Score Color Yellow  Assess: if the MEWS score is Yellow or Red  Were vital signs taken at a resting state? Yes  Focused Assessment No change from prior assessment  Early Detection of Sepsis Score *See Row Information* Low  MEWS guidelines implemented *See Row Information* Yes  Treat  Pain Scale 0-10  Pain Score 0  Take Vital Signs  Increase Vital Sign Frequency  Yellow: Q 2hr X 2 then Q 4hr X 2, if remains yellow, continue Q 4hrs  Escalate  MEWS: Escalate Yellow: discuss with charge nurse/RN and consider discussing with provider and RRT  Notify: Charge Nurse/RN  Name of Charge Nurse/RN Notified Wynona Meals RN  Date Charge Nurse/RN Notified 04/15/21  Time Charge Nurse/RN Notified 1900  Document  Patient Outcome Stabilized after interventions  Progress note created (see row info) Yes  Assess: SIRS CRITERIA  SIRS Temperature  0  SIRS Pulse 1  SIRS Respirations  0  SIRS WBC 0  SIRS Score Sum  1

## 2021-04-16 NOTE — Progress Notes (Signed)
Patient transferred from bed to stretcher by staff. No acute distress noted. Daughter at bedside. Patient discharged as per orders.

## 2021-04-16 NOTE — Plan of Care (Signed)
  Problem: Education: Goal: Knowledge of General Education information will improve Description: Including pain rating scale, medication(s)/side effects and non-pharmacologic comfort measures 04/16/2021 1931 by Camillia Herter, RN Outcome: Adequate for Discharge 04/16/2021 1202 by Camillia Herter, RN Outcome: Progressing   Problem: Health Behavior/Discharge Planning: Goal: Ability to manage health-related needs will improve 04/16/2021 1931 by Camillia Herter, RN Outcome: Adequate for Discharge 04/16/2021 1202 by Camillia Herter, RN Outcome: Progressing   Problem: Clinical Measurements: Goal: Ability to maintain clinical measurements within normal limits will improve 04/16/2021 1931 by Camillia Herter, RN Outcome: Adequate for Discharge 04/16/2021 1202 by Camillia Herter, RN Outcome: Progressing Goal: Will remain free from infection 04/16/2021 1931 by Camillia Herter, RN Outcome: Adequate for Discharge 04/16/2021 1202 by Camillia Herter, RN Outcome: Progressing Goal: Diagnostic test results will improve 04/16/2021 1931 by Camillia Herter, RN Outcome: Adequate for Discharge 04/16/2021 1202 by Camillia Herter, RN Outcome: Progressing Goal: Respiratory complications will improve 04/16/2021 1931 by Camillia Herter, RN Outcome: Adequate for Discharge 04/16/2021 1202 by Camillia Herter, RN Outcome: Progressing Goal: Cardiovascular complication will be avoided 04/16/2021 1931 by Camillia Herter, RN Outcome: Adequate for Discharge 04/16/2021 1202 by Camillia Herter, RN Outcome: Progressing   Problem: Activity: Goal: Risk for activity intolerance will decrease 04/16/2021 1931 by Camillia Herter, RN Outcome: Adequate for Discharge 04/16/2021 1202 by Camillia Herter, RN Outcome: Progressing   Problem: Nutrition: Goal: Adequate nutrition will be maintained 04/16/2021 1931 by Camillia Herter, RN Outcome: Adequate for Discharge 04/16/2021 1202 by Camillia Herter, RN Outcome:  Progressing   Problem: Coping: Goal: Level of anxiety will decrease 04/16/2021 1931 by Camillia Herter, RN Outcome: Adequate for Discharge 04/16/2021 1202 by Camillia Herter, RN Outcome: Progressing   Problem: Elimination: Goal: Will not experience complications related to bowel motility 04/16/2021 1931 by Camillia Herter, RN Outcome: Adequate for Discharge 04/16/2021 1202 by Camillia Herter, RN Outcome: Progressing Goal: Will not experience complications related to urinary retention 04/16/2021 1931 by Camillia Herter, RN Outcome: Adequate for Discharge 04/16/2021 1202 by Camillia Herter, RN Outcome: Progressing   Problem: Pain Managment: Goal: General experience of comfort will improve 04/16/2021 1931 by Camillia Herter, RN Outcome: Adequate for Discharge 04/16/2021 1202 by Camillia Herter, RN Outcome: Progressing   Problem: Safety: Goal: Ability to remain free from injury will improve 04/16/2021 1931 by Camillia Herter, RN Outcome: Adequate for Discharge 04/16/2021 1202 by Camillia Herter, RN Outcome: Progressing   Problem: Skin Integrity: Goal: Risk for impaired skin integrity will decrease 04/16/2021 1931 by Camillia Herter, RN Outcome: Adequate for Discharge 04/16/2021 1202 by Camillia Herter, RN Outcome: Progressing

## 2021-04-16 NOTE — Plan of Care (Signed)

## 2021-04-17 DIAGNOSIS — H2620 Unspecified complicated cataract: Secondary | ICD-10-CM | POA: Diagnosis not present

## 2021-04-17 DIAGNOSIS — J9601 Acute respiratory failure with hypoxia: Secondary | ICD-10-CM | POA: Diagnosis not present

## 2021-04-17 DIAGNOSIS — I5033 Acute on chronic diastolic (congestive) heart failure: Secondary | ICD-10-CM | POA: Diagnosis not present

## 2021-04-17 DIAGNOSIS — I350 Nonrheumatic aortic (valve) stenosis: Secondary | ICD-10-CM | POA: Diagnosis not present

## 2021-04-21 DIAGNOSIS — I5033 Acute on chronic diastolic (congestive) heart failure: Secondary | ICD-10-CM | POA: Diagnosis not present

## 2021-04-21 DIAGNOSIS — R0902 Hypoxemia: Secondary | ICD-10-CM | POA: Diagnosis not present

## 2021-04-22 DIAGNOSIS — R5383 Other fatigue: Secondary | ICD-10-CM | POA: Diagnosis not present

## 2021-04-22 DIAGNOSIS — M542 Cervicalgia: Secondary | ICD-10-CM | POA: Diagnosis not present

## 2021-04-22 DIAGNOSIS — I479 Paroxysmal tachycardia, unspecified: Secondary | ICD-10-CM | POA: Diagnosis not present

## 2021-04-24 DIAGNOSIS — N8110 Cystocele, unspecified: Secondary | ICD-10-CM | POA: Diagnosis not present

## 2021-04-24 DIAGNOSIS — H2620 Unspecified complicated cataract: Secondary | ICD-10-CM | POA: Diagnosis not present

## 2021-04-24 DIAGNOSIS — R300 Dysuria: Secondary | ICD-10-CM | POA: Diagnosis not present

## 2021-04-25 DIAGNOSIS — R8290 Unspecified abnormal findings in urine: Secondary | ICD-10-CM | POA: Diagnosis not present

## 2021-04-28 DIAGNOSIS — M542 Cervicalgia: Secondary | ICD-10-CM | POA: Diagnosis not present

## 2021-04-28 DIAGNOSIS — N390 Urinary tract infection, site not specified: Secondary | ICD-10-CM | POA: Diagnosis not present

## 2021-04-28 DIAGNOSIS — N8110 Cystocele, unspecified: Secondary | ICD-10-CM | POA: Diagnosis not present

## 2021-04-30 DIAGNOSIS — Z9841 Cataract extraction status, right eye: Secondary | ICD-10-CM | POA: Diagnosis not present

## 2021-04-30 DIAGNOSIS — J9601 Acute respiratory failure with hypoxia: Secondary | ICD-10-CM | POA: Diagnosis not present

## 2021-04-30 DIAGNOSIS — R9431 Abnormal electrocardiogram [ECG] [EKG]: Secondary | ICD-10-CM | POA: Diagnosis not present

## 2021-04-30 DIAGNOSIS — E038 Other specified hypothyroidism: Secondary | ICD-10-CM | POA: Diagnosis not present

## 2021-04-30 DIAGNOSIS — I479 Paroxysmal tachycardia, unspecified: Secondary | ICD-10-CM | POA: Diagnosis not present

## 2021-04-30 DIAGNOSIS — I5033 Acute on chronic diastolic (congestive) heart failure: Secondary | ICD-10-CM | POA: Diagnosis not present

## 2021-04-30 DIAGNOSIS — N8110 Cystocele, unspecified: Secondary | ICD-10-CM | POA: Diagnosis not present

## 2021-04-30 DIAGNOSIS — I350 Nonrheumatic aortic (valve) stenosis: Secondary | ICD-10-CM | POA: Diagnosis not present

## 2021-04-30 DIAGNOSIS — N390 Urinary tract infection, site not specified: Secondary | ICD-10-CM | POA: Diagnosis not present

## 2021-05-01 ENCOUNTER — Other Ambulatory Visit: Payer: Self-pay | Admitting: Internal Medicine

## 2021-05-01 DIAGNOSIS — N3000 Acute cystitis without hematuria: Secondary | ICD-10-CM

## 2021-05-02 ENCOUNTER — Other Ambulatory Visit: Payer: Self-pay | Admitting: *Deleted

## 2021-05-02 ENCOUNTER — Other Ambulatory Visit: Payer: Self-pay

## 2021-05-02 ENCOUNTER — Observation Stay (HOSPITAL_COMMUNITY)
Admission: EM | Admit: 2021-05-02 | Discharge: 2021-05-04 | Disposition: A | Discharge status: Home / Self Care | Payer: Medicare Other | Attending: Family Medicine | Admitting: Family Medicine

## 2021-05-02 ENCOUNTER — Other Ambulatory Visit: Payer: Medicare Other

## 2021-05-02 ENCOUNTER — Encounter (HOSPITAL_COMMUNITY): Payer: Self-pay

## 2021-05-02 DIAGNOSIS — N3001 Acute cystitis with hematuria: Secondary | ICD-10-CM

## 2021-05-02 DIAGNOSIS — N390 Urinary tract infection, site not specified: Principal | ICD-10-CM | POA: Diagnosis present

## 2021-05-02 DIAGNOSIS — E038 Other specified hypothyroidism: Secondary | ICD-10-CM | POA: Diagnosis present

## 2021-05-02 DIAGNOSIS — R7303 Prediabetes: Secondary | ICD-10-CM | POA: Diagnosis not present

## 2021-05-02 DIAGNOSIS — I872 Venous insufficiency (chronic) (peripheral): Secondary | ICD-10-CM | POA: Diagnosis not present

## 2021-05-02 DIAGNOSIS — N179 Acute kidney failure, unspecified: Secondary | ICD-10-CM | POA: Diagnosis not present

## 2021-05-02 DIAGNOSIS — I350 Nonrheumatic aortic (valve) stenosis: Secondary | ICD-10-CM | POA: Diagnosis not present

## 2021-05-02 DIAGNOSIS — G4700 Insomnia, unspecified: Secondary | ICD-10-CM | POA: Diagnosis not present

## 2021-05-02 DIAGNOSIS — Z20822 Contact with and (suspected) exposure to covid-19: Secondary | ICD-10-CM | POA: Insufficient documentation

## 2021-05-02 DIAGNOSIS — N8110 Cystocele, unspecified: Secondary | ICD-10-CM | POA: Diagnosis not present

## 2021-05-02 DIAGNOSIS — R300 Dysuria: Secondary | ICD-10-CM | POA: Diagnosis not present

## 2021-05-02 DIAGNOSIS — N819 Female genital prolapse, unspecified: Secondary | ICD-10-CM

## 2021-05-02 DIAGNOSIS — E039 Hypothyroidism, unspecified: Secondary | ICD-10-CM | POA: Diagnosis not present

## 2021-05-02 DIAGNOSIS — I5032 Chronic diastolic (congestive) heart failure: Secondary | ICD-10-CM | POA: Diagnosis not present

## 2021-05-02 DIAGNOSIS — I110 Hypertensive heart disease with heart failure: Secondary | ICD-10-CM | POA: Insufficient documentation

## 2021-05-02 DIAGNOSIS — R531 Weakness: Secondary | ICD-10-CM

## 2021-05-02 DIAGNOSIS — N3000 Acute cystitis without hematuria: Secondary | ICD-10-CM | POA: Diagnosis not present

## 2021-05-02 DIAGNOSIS — Z9181 History of falling: Secondary | ICD-10-CM | POA: Diagnosis not present

## 2021-05-02 DIAGNOSIS — B9689 Other specified bacterial agents as the cause of diseases classified elsewhere: Secondary | ICD-10-CM | POA: Insufficient documentation

## 2021-05-02 DIAGNOSIS — I5033 Acute on chronic diastolic (congestive) heart failure: Secondary | ICD-10-CM | POA: Diagnosis not present

## 2021-05-02 DIAGNOSIS — Z743 Need for continuous supervision: Secondary | ICD-10-CM | POA: Diagnosis not present

## 2021-05-02 DIAGNOSIS — J449 Chronic obstructive pulmonary disease, unspecified: Secondary | ICD-10-CM | POA: Diagnosis not present

## 2021-05-02 DIAGNOSIS — Z993 Dependence on wheelchair: Secondary | ICD-10-CM | POA: Diagnosis not present

## 2021-05-02 DIAGNOSIS — I4581 Long QT syndrome: Secondary | ICD-10-CM | POA: Diagnosis not present

## 2021-05-02 DIAGNOSIS — J9601 Acute respiratory failure with hypoxia: Secondary | ICD-10-CM | POA: Diagnosis not present

## 2021-05-02 DIAGNOSIS — I10 Essential (primary) hypertension: Secondary | ICD-10-CM | POA: Diagnosis not present

## 2021-05-02 DIAGNOSIS — K219 Gastro-esophageal reflux disease without esophagitis: Secondary | ICD-10-CM | POA: Diagnosis not present

## 2021-05-02 DIAGNOSIS — I959 Hypotension, unspecified: Secondary | ICD-10-CM | POA: Diagnosis not present

## 2021-05-02 DIAGNOSIS — Z79899 Other long term (current) drug therapy: Secondary | ICD-10-CM | POA: Insufficient documentation

## 2021-05-02 DIAGNOSIS — M810 Age-related osteoporosis without current pathological fracture: Secondary | ICD-10-CM | POA: Diagnosis not present

## 2021-05-02 HISTORY — DX: Acute kidney failure, unspecified: N17.9

## 2021-05-02 LAB — URINALYSIS, ROUTINE W REFLEX MICROSCOPIC
Bilirubin Urine: NEGATIVE
Glucose, UA: NEGATIVE mg/dL
Ketones, ur: NEGATIVE mg/dL
Leukocytes,Ua: NEGATIVE
Nitrite: POSITIVE — AB
Protein, ur: NEGATIVE mg/dL
Specific Gravity, Urine: 1.008 (ref 1.005–1.030)
pH: 6 (ref 5.0–8.0)

## 2021-05-02 LAB — COMPREHENSIVE METABOLIC PANEL
ALT: 20 U/L (ref 0–44)
AST: 32 U/L (ref 15–41)
Albumin: 3.6 g/dL (ref 3.5–5.0)
Alkaline Phosphatase: 55 U/L (ref 38–126)
Anion gap: 12 (ref 5–15)
BUN: 22 mg/dL (ref 8–23)
CO2: 26 mmol/L (ref 22–32)
Calcium: 10 mg/dL (ref 8.9–10.3)
Chloride: 98 mmol/L (ref 98–111)
Creatinine, Ser: 1.01 mg/dL — ABNORMAL HIGH (ref 0.44–1.00)
GFR, Estimated: 53 mL/min — ABNORMAL LOW (ref >60)
Glucose, Bld: 118 mg/dL — ABNORMAL HIGH (ref 70–99)
Potassium: 3.7 mmol/L (ref 3.5–5.1)
Sodium: 136 mmol/L (ref 135–145)
Total Bilirubin: 0.7 mg/dL (ref 0.3–1.2)
Total Protein: 7.3 g/dL (ref 6.5–8.1)

## 2021-05-02 LAB — CBC WITH DIFFERENTIAL/PLATELET
Abs Immature Granulocytes: 0.03 10*3/uL (ref 0.00–0.07)
Basophils Absolute: 0 10*3/uL (ref 0.0–0.1)
Basophils Relative: 1 %
Eosinophils Absolute: 0 10*3/uL (ref 0.0–0.5)
Eosinophils Relative: 0 %
HCT: 44.3 % (ref 36.0–46.0)
Hemoglobin: 14.5 g/dL (ref 12.0–15.0)
Immature Granulocytes: 0 %
Lymphocytes Relative: 22 %
Lymphs Abs: 1.7 10*3/uL (ref 0.7–4.0)
MCH: 29.8 pg (ref 26.0–34.0)
MCHC: 32.7 g/dL (ref 30.0–36.0)
MCV: 91 fL (ref 80.0–100.0)
Monocytes Absolute: 0.6 10*3/uL (ref 0.1–1.0)
Monocytes Relative: 7 %
Neutro Abs: 5.4 10*3/uL (ref 1.7–7.7)
Neutrophils Relative %: 70 %
Platelets: 173 10*3/uL (ref 150–400)
RBC: 4.87 MIL/uL (ref 3.87–5.11)
RDW: 13.7 % (ref 11.5–15.5)
WBC: 7.8 10*3/uL (ref 4.0–10.5)
nRBC: 0 % (ref 0.0–0.2)

## 2021-05-02 MED ORDER — SODIUM CHLORIDE 0.9 % IV SOLN: INTRAVENOUS | Status: AC

## 2021-05-02 MED ORDER — ACETAMINOPHEN 325 MG PO TABS
650.0000 mg | ORAL_TABLET | Freq: Four times a day (QID) | ORAL | Status: DC | PRN
  Administered 2021-05-04: 650 mg via ORAL
  Filled 2021-05-02: qty 2

## 2021-05-02 MED ORDER — ACETAMINOPHEN 650 MG RE SUPP: 650.0000 mg | Freq: Four times a day (QID) | RECTAL | Status: DC | PRN

## 2021-05-02 MED ORDER — METOPROLOL TARTRATE 25 MG PO TABS
12.5000 mg | ORAL_TABLET | Freq: Two times a day (BID) | ORAL | Status: DC
  Administered 2021-05-02 – 2021-05-04 (×4): 12.5 mg via ORAL
  Filled 2021-05-02 (×4): qty 1

## 2021-05-02 MED ORDER — LEVOTHYROXINE SODIUM 50 MCG PO TABS
50.0000 ug | ORAL_TABLET | Freq: Every day | ORAL | Status: DC
  Administered 2021-05-03 – 2021-05-04 (×2): 50 ug via ORAL
  Filled 2021-05-02 (×2): qty 1

## 2021-05-02 MED ORDER — BUSPIRONE HCL 5 MG PO TABS
5.0000 mg | ORAL_TABLET | Freq: Two times a day (BID) | ORAL | Status: DC
  Administered 2021-05-02 – 2021-05-04 (×4): 5 mg via ORAL
  Filled 2021-05-02 (×4): qty 1

## 2021-05-02 MED ORDER — GENTAMICIN SULFATE 40 MG/ML IJ SOLN
5.0000 mg/kg | Freq: Once | INTRAVENOUS | Status: AC
  Administered 2021-05-02: 300 mg via INTRAVENOUS
  Filled 2021-05-02: qty 7.5

## 2021-05-02 MED ORDER — TERAZOSIN HCL 5 MG PO CAPS
5.0000 mg | ORAL_CAPSULE | Freq: Every day | ORAL | Status: DC
  Administered 2021-05-02 – 2021-05-03 (×2): 5 mg via ORAL
  Filled 2021-05-02 (×2): qty 1

## 2021-05-02 MED ORDER — ENOXAPARIN SODIUM 40 MG/0.4ML IJ SOSY
40.0000 mg | PREFILLED_SYRINGE | INTRAMUSCULAR | Status: DC
  Administered 2021-05-03 – 2021-05-04 (×2): 40 mg via SUBCUTANEOUS
  Filled 2021-05-02 (×2): qty 0.4

## 2021-05-02 MED ORDER — GENTAMICIN SULFATE 40 MG/ML IJ SOLN
7.0000 mg/kg | Freq: Once | INTRAVENOUS | Status: DC
  Filled 2021-05-02: qty 10.5

## 2021-05-02 MED ORDER — GENTAMICIN SULFATE 40 MG/ML IJ SOLN: 5.0000 mg/kg | Freq: Once | INTRAVENOUS | Status: DC

## 2021-05-02 NOTE — H&P (Signed)
History and Physical    Alyssa Schmitt KDX:[id number] DOB: 1928-02-24 DOA: 05/02/2021  PCP: Unk Pinto, MD Patient coming from: Home  Chief Complaint: Dysuria, generalized weakness  HPI: Alyssa Schmitt is a 87 y.o. female with medical history significant of chronic diastolic heart failure, hypertension, hypothyroidism, COPD, obesity (BMI 34.44), GERD, prediabetes presented to the ED via EMS complaining of dysuria and generalized weakness. In the ED, vital signs stable.  Severe pelvic organ prolapse with a tender and excoriated uterus noted on pelvic exam.  Labs showing WBC 7.8, hemoglobin 14.5, platelet count 173K.  Sodium 136, potassium 3.7, chloride 98, bicarb 26, BUN 22, creatinine 1.0 (baseline 0.5-0.6), glucose 118.  LFTs normal.  UA showing positive nitrite, negative leukocytes, 0-5 WBCs, and rare bacteria.  Urine culture pending.  SARS-CoV-2 PCR test pending. Patient was given gentamicin (based on allergy profile and previous urine culture results).  Patient is complaining of dysuria.  History provided mostly by daughter at bedside who states that the patient was recently at a nursing facility and discharged a few days ago.  While at the nursing facility, she was diagnosed with UTI due to E. coli and was started on ertapenem.  Up until a few days ago patient was able to ambulate with a walker but since this morning she has been very weak and barely able to use her legs.  She is complaining of dysuria.  Per daughter, patient has a history of recurrent UTIs and also history of pelvic organ prolapse for which she was by a gynecologist 15 years ago and a pessary was placed which subsequently had to be removed as patient started having severe vaginal bleeding.  She is not seeing a gynecologist at present.  Per daughter, patient has not had any fevers, cough, shortness of breath, vomiting, diarrhea, or abdominal pain.  She is vaccinated against COVID.  Daughter does not want the patient to go  back to a nursing facility and prefers that she goes back home with home health services.  Review of Systems:  All systems reviewed and apart from history of presenting illness, are negative.  Past Medical History:  Diagnosis Date  . COPD (chronic obstructive pulmonary disease) ([place])   . GERD (gastroesophageal reflux disease)   . Hyperlipidemia   . Hypertension   . Morbid obesity ([place]) 08/01/2014  . Osteoporosis   . Prediabetes   . Vitamin D deficiency     Past Surgical History:  Procedure Laterality Date  . TONSILLECTOMY     childhood  . VESICOVAGINAL FISTULA CLOSURE W/ TAH       reports that she has never smoked. She has never used smokeless tobacco. She reports that she does not drink alcohol and does not use drugs.  Allergies  Allergen Reactions  . Aspirin Swelling  . Celebrex [Celecoxib] Other (See Comments)    "my body lit up"  . Fosamax [Alendronate Sodium] Other (See Comments)    unknown  . Keflex [Cephalexin] Other (See Comments)    unknown  . Penicillins Other (See Comments)    unknown  . Prednisone Itching  . Prevacid [Lansoprazole] Other (See Comments)    unknown  . Sulfa Antibiotics Other (See Comments)    unknown  . Tetanus Toxoids Other (See Comments)    unknown  . Tetracyclines & Related Nausea Only         Family History  Problem Relation Age of Onset  . Heart disease Mother   . Other Mother  VARICOSE VEINS  . Diabetes Father   . Other Brother        VARICOSE VEINS    Prior to Admission medications   Medication Sig Start Date End Date Taking? Authorizing Provider  acetaminophen (TYLENOL) 325 MG tablet Take 2 tablets (650 mg total) by mouth every 6 (six) hours as needed (prn TEMP>38.1 Celsius). 12/21/14  Yes Reyne Dumas, MD  busPIRone (BUSPAR) 5 MG tablet Take 5 mg by mouth 2 (two) times daily. 04/17/21  Yes [provider]  calcium-vitamin D (OSCAL WITH D) 250-125 MG-UNIT per tablet Take 1 tablet by mouth daily.   Yes  [provider]  Cholecalciferol (VITAMIN D3) 2000 UNITS TABS Take 3 tablets by mouth daily.   Yes [provider]  D-Mannose 500 MG CAPS Take 1 tablet by mouth 2 (two) times daily.   Yes [provider]  ertapenem (INVANZ) 1 g injection Inject 1 g into the muscle daily. Start date : 04/29/21 04/28/21  Yes [provider]  fexofenadine (ALLEGRA) 180 MG tablet Take 180 mg by mouth daily.    Yes [provider]  furosemide (LASIX) 20 MG tablet Take 1 tablet (20 mg total) by mouth daily. 04/15/21  Yes Regalado, Belkys A, MD  levothyroxine (SYNTHROID) 50 MCG tablet TAKE 1 TABLET DAILY ON AN EMPTY STOMACH WITH ONLY WATER FOR 30 MINUTES & NO ANTACID MEDS, CALCIUM OR MAGNESIUM FOR 4 HOURS & AVOID BIOTIN Patient taking differently: Take 50 mcg by mouth daily before breakfast. 02/24/21  Yes McClanahan, Kyra, NP  magnesium oxide (MAG-OX) 400 (241.3 MG) MG tablet Take 1 tablet (400 mg total) by mouth 2 (two) times daily. Patient taking differently: Take 400 mg by mouth daily. 12/21/14  Yes Reyne Dumas, MD  metoprolol tartrate (LOPRESSOR) 25 MG tablet Take 0.5 tablets (12.5 mg total) by mouth 2 (two) times daily. 04/15/21  Yes Regalado, Belkys A, MD  Multiple Vitamin (MULITIVITAMIN WITH MINERALS) TABS Take 1 tablet by mouth daily.   Yes [provider]  nystatin (MYCOSTATIN/NYSTOP) powder Apply daily after a shower as needed Patient taking differently: Apply 1 application topically daily. Apply daily after a shower . 01/13/21  Yes [name], NP  omeprazole (PRILOSEC) 20 MG capsule Take 20 mg by mouth daily. 04/16/21  Yes [provider]  potassium chloride (KLOR-CON) 10 MEQ tablet Take 10 mEq by mouth daily.   Yes [provider]  Probiotic Product (PROBIOTIC FORMULA PO) Take 1 tablet by mouth daily.    Yes [provider]  terazosin (HYTRIN) 5 MG capsule Take 5 mg by mouth at bedtime.   Yes [provider]  zinc  gluconate 50 MG tablet Take 50 mg by mouth daily.   Yes [provider]  omeprazole (PRILOSEC) 40 MG capsule TAKE 1 CAPSULE BY MOUTH EVERY DAY Patient not taking: No sig reported 04/11/20   [name], NP  potassium chloride SA (KLOR-CON) 20 MEQ tablet Take 1 tablet (20 mEq total) by mouth daily. Patient not taking: No sig reported 04/12/21   Caren Griffins, MD  senna-docusate (SENOKOT-S) 8.6-50 MG tablet Take 1 tablet by mouth daily. Patient not taking: No sig reported 04/15/21   Regalado, Belkys A, MD  terazosin (HYTRIN) 10 MG capsule TAKE 1 TABLET AT BEDTIME FOR BP Patient not taking: No sig reported 10/16/20   [name], NP    Physical Exam: Vitals:   05/02/21 1710 05/02/21 1720 05/02/21 1833  BP: 136/82  126/79  Pulse: 92  87  Resp: 18  18  Temp: 98.5 F (36.9 C)    TempSrc: Oral    SpO2: 96%  97%  Weight:  80 kg   Height:  5' (1.524 m)     Physical Exam Constitutional:      General: She is not in acute distress. HENT:     Head: Normocephalic and atraumatic.  Eyes:     Extraocular Movements: Extraocular movements intact.     Conjunctiva/sclera: Conjunctivae normal.  Cardiovascular:     Rate and Rhythm: Normal rate and regular rhythm.     Pulses: Normal pulses.  Pulmonary:     Effort: Pulmonary effort is normal. No respiratory distress.     Breath sounds: Normal breath sounds. No wheezing or rales.  Abdominal:     General: Bowel sounds are normal. There is no distension.     Palpations: Abdomen is soft.     Tenderness: There is no abdominal tenderness.  Musculoskeletal:        General: No swelling or tenderness.     Cervical back: Normal range of motion and neck supple.  Skin:    General: Skin is warm and dry.     Comments: Decreased skin turgor  Neurological:     General: No focal deficit present.     Mental Status: She is alert and oriented to person, place, and time.     Cranial Nerves: No cranial nerve deficit.     Sensory: No sensory  deficit.     Motor: No weakness.     Labs on Admission: I have personally reviewed following labs and imaging studies  CBC: Recent Labs  Lab 05/02/21 1804  WBC 7.8  NEUTROABS 5.4  HGB 14.5  HCT 44.3  MCV 91.0  PLT 449   Basic Metabolic Panel: Recent Labs  Lab 05/02/21 1804  NA 136  K 3.7  CL 98  CO2 26  GLUCOSE 118*  BUN 22  CREATININE 1.01*  CALCIUM 10.0   GFR: Estimated Creatinine Clearance: 34.7 mL/min (A) (by C-G formula based on SCr of 1.01 mg/dL (H)). Liver Function Tests: Recent Labs  Lab 05/02/21 1804  AST 32  ALT 20  ALKPHOS 55  BILITOT 0.7  PROT 7.3  ALBUMIN 3.6   No results for input(s): LIPASE, AMYLASE in the last 168 hours. No results for input(s): AMMONIA in the last 168 hours. Coagulation Profile: No results for input(s): INR, PROTIME in the last 168 hours. Cardiac Enzymes: No results for input(s): CKTOTAL, CKMB, CKMBINDEX, TROPONINI in the last 168 hours. BNP (last 3 results) No results for input(s): PROBNP in the last 8760 hours. HbA1C: No results for input(s): HGBA1C in the last 72 hours. CBG: No results for input(s): GLUCAP in the last 168 hours. Lipid Profile: No results for input(s): CHOL, HDL, LDLCALC, TRIG, CHOLHDL, LDLDIRECT in the last 72 hours. Thyroid Function Tests: No results for input(s): TSH, T4TOTAL, FREET4, T3FREE, THYROIDAB in the last 72 hours. Anemia Panel: No results for input(s): VITAMINB12, FOLATE, FERRITIN, TIBC, IRON, RETICCTPCT in the last 72 hours. Urine analysis:    Component Value Date/Time   COLORURINE AMBER (A) 05/02/2021 1810   APPEARANCEUR CLEAR 05/02/2021 1810   LABSPEC 1.008 05/02/2021 1810   PHURINE 6.0 05/02/2021 1810   GLUCOSEU NEGATIVE 05/02/2021 1810   HGBUR SMALL (A) 05/02/2021 1810   BILIRUBINUR NEGATIVE 05/02/2021 1810   KETONESUR NEGATIVE 05/02/2021 1810   PROTEINUR NEGATIVE 05/02/2021 1810   UROBILINOGEN 0.2 12/19/2014 1357   NITRITE POSITIVE (A) 05/02/2021 1810  LEUKOCYTESUR  NEGATIVE 05/02/2021 1810    Radiological Exams on Admission: No results found.  EKG: Independently reviewed.  Sinus rhythm, LBBB, first-degree AV block, QTC 500.  QT interval increased since prior tracing.  Assessment/Plan Principal Problem:   UTI (urinary tract infection) Active Problems:   Hypothyroidism   AKI (acute kidney injury) ([place])   Prolapse of female pelvic organs   Generalized weakness   Possible UTI Reportedly patient was diagnosed with a UTI at her nursing facility recently and was treated with ertapenem.  She has continued to have dysuria. UA showing positive nitrite, negative leukocytes, 0-5 WBCs, and rare bacteria.  No fever, leukocytosis, or signs of sepsis.  She was started on gentamicin in the ED based on allergy profile and previous urine culture results. -Continue gentamicin.  Urine culture pending.  Severe pelvic organ prolapse Could be contributing to recurrent UTIs. Severe pelvic organ prolapse with a tender and excoriated uterus noted on pelvic exam done by ED provider.  This could be contributing to her recurrent UTIs.  I am not able to do a pelvic exam at this time as the patient is located in the ED hallway.  Per daughter, patient was seen by a gynecologist 15 years ago and a pessary was placed but subsequently had to be removed due to severe vaginal bleeding. -Consult gynecology in the morning.  AKI Possibly prerenal from dehydration.  BUN 22, creatinine 1.0 (baseline 0.5-0.6). -Gentle IV fluid hydration.  Monitor renal function and urine output.  Avoid nephrotoxic agents/hold home Lasix.  Generalized weakness Possibly due to UTI. -Continue management of UTI.  Check TSH level.  PT/OT eval, fall precautions.  Chronic diastolic heart failure Echo done in April 2013 showing normal systolic function with EF 55 to 60% and grade 1 diastolic dysfunction.  No signs of volume overload at this time. -Hold diuretic given AKI and monitor volume status  closely  Hypertension -Hold Lasix given AKI.  Continue metoprolol.  Hypothyroidism -Continue Synthroid.  Check TSH level.  Prediabetes -Check A1c  QT prolongation -Cardiac monitoring.  Monitor potassium and magnesium levels.  Avoid QT prolonging drugs if possible.  Repeat EKG in AM.  DVT prophylaxis: Lovenox Code Status: DNR-discussed with the patient and her daughter at bedside. Family Communication: Daughter updated. Disposition Plan: Status is: Observation  The patient remains OBS appropriate and will d/c before 2 midnights.  Dispo: The patient is from: Home              Anticipated d/c is to: SNF              Patient currently is not medically stable to d/c.   Difficult to place patient No  Level of care: Level of care: Med-Surg   The medical decision making on this patient was of high complexity and the patient is at high risk for clinical deterioration, therefore this is a level 3 visit.  Shela Leff MD [facility]  If 7PM-7AM, please contact night-coverage [email or web address]  05/02/2021, 10:55 PM

## 2021-05-02 NOTE — ED Notes (Signed)
ED TO INPATIENT HANDOFF REPORT  Name/Age/Gender Alyssa Schmitt 87 y.o. female  Code Status    Code Status Orders  (From admission, onward)         Start     Ordered   05/02/21 2253  Do not attempt resuscitation (DNR)  Continuous       Question Answer Comment  In the event of cardiac or respiratory ARREST Do not call a "code blue"   In the event of cardiac or respiratory ARREST Do not perform Intubation, CPR, defibrillation or ACLS   In the event of cardiac or respiratory ARREST Use medication by any route, position, wound care, and other measures to relive pain and suffering. May use oxygen, suction and manual treatment of airway obstruction as needed for comfort.      05/02/21 2252        Code Status History    Date Active Date Inactive Code Status Order ID Comments User Context   05/02/2021 2251 05/02/2021 2252 Full Code [id number]  Shela Leff, MD ED   04/14/2021 1736 04/17/2021 0329 DNR [id number]  Elmarie Shiley, MD Inpatient   04/11/2021 0040 04/14/2021 1736 Full Code [id number]  Kayleen Memos, DO Inpatient   12/15/2014 0519 12/21/2014 2024 Full Code [id number]  Etta Quill, DO ED   Advance Care Planning Activity    Advance Directive Documentation   Flowsheet Row Most Recent Value  Type of Advance Directive Healthcare Power of Attorney  Pre-existing out of facility DNR order (yellow form or pink MOST form) --  "MOST" Form in Place? --      Home/SNF/Other Nursing Home  Chief Complaint UTI (urinary tract infection) [N39.0]  Level of Care/Admitting Diagnosis ED Disposition    ED Disposition Condition [place]: [facility] [[id number]]  Level of Care: Telemetry [5]  Admit to tele based on following criteria: Monitor QTC interval  Covid Evaluation: Asymptomatic Screening Protocol (No Symptoms)  Diagnosis: UTI (urinary tract infection) [169450]  Admitting Physician: Shela Leff [[id number]]  Attending Physician:  Shela Leff [[id number]]       Medical History Past Medical History:  Diagnosis Date  . COPD (chronic obstructive pulmonary disease) ([place])   . GERD (gastroesophageal reflux disease)   . Hyperlipidemia   . Hypertension   . Morbid obesity ([place]) 08/01/2014  . Osteoporosis   . Prediabetes   . Vitamin D deficiency     Allergies Allergies  Allergen Reactions  . Aspirin Swelling  . Celebrex [Celecoxib] Other (See Comments)    "my body lit up"  . Fosamax [Alendronate Sodium] Other (See Comments)    unknown  . Keflex [Cephalexin] Other (See Comments)    unknown  . Penicillins Other (See Comments)    unknown  . Prednisone Itching  . Prevacid [Lansoprazole] Other (See Comments)    unknown  . Sulfa Antibiotics Other (See Comments)    unknown  . Tetanus Toxoids Other (See Comments)    unknown  . Tetracyclines & Related Nausea Only         IV Location/Drains/Wounds Patient Lines/Drains/Airways Status    Active Line/Drains/Airways    Name Placement date Placement time Site Days   Peripheral IV 04/10/21 Anterior;Right Wrist 04/10/21  2000  Wrist  22   Peripheral IV 05/02/21 22 G Posterior;Right Wrist 05/02/21  1800  Wrist  less than 1   Pressure Injury 04/11/21 Buttocks Stage 1 -  Intact skin with non-blanchable redness of a localized area usually over a  bony prominence. 04/11/21  0030  -- 21          Labs/Imaging Results for orders placed or performed during the hospital encounter of 05/02/21 (from the past 48 hour(s))  CBC with Differential     Status: None   Collection Time: 05/02/21  6:04 PM  Result Value Ref Range   WBC 7.8 4.0 - 10.5 K/uL   RBC 4.87 3.87 - 5.11 MIL/uL   Hemoglobin 14.5 12.0 - 15.0 g/dL   HCT 44.3 36.0 - 46.0 %   MCV 91.0 80.0 - 100.0 fL   MCH 29.8 26.0 - 34.0 pg   MCHC 32.7 30.0 - 36.0 g/dL   RDW 13.7 11.5 - 15.5 %   Platelets 173 150 - 400 K/uL   nRBC 0.0 0.0 - 0.2 %   Neutrophils Relative % 70 %   Neutro Abs 5.4 1.7 - 7.7 K/uL    Lymphocytes Relative 22 %   Lymphs Abs 1.7 0.7 - 4.0 K/uL   Monocytes Relative 7 %   Monocytes Absolute 0.6 0.1 - 1.0 K/uL   Eosinophils Relative 0 %   Eosinophils Absolute 0.0 0.0 - 0.5 K/uL   Basophils Relative 1 %   Basophils Absolute 0.0 0.0 - 0.1 K/uL   Immature Granulocytes 0 %   Abs Immature Granulocytes 0.03 0.00 - 0.07 K/uL    Comment: Performed at [facility], [place] [address], [place], [place] [address]  Comprehensive metabolic panel     Status: Abnormal   Collection Time: 05/02/21  6:04 PM  Result Value Ref Range   Sodium 136 135 - 145 mmol/L   Potassium 3.7 3.5 - 5.1 mmol/L   Chloride 98 98 - 111 mmol/L   CO2 26 22 - 32 mmol/L   Glucose, Bld 118 (H) 70 - 99 mg/dL    Comment: Glucose reference range applies only to samples taken after fasting for at least 8 hours.   BUN 22 8 - 23 mg/dL   Creatinine, Ser 1.01 (H) 0.44 - 1.00 mg/dL   Calcium 10.0 8.9 - 10.3 mg/dL   Total Protein 7.3 6.5 - 8.1 g/dL   Albumin 3.6 3.5 - 5.0 g/dL   AST 32 15 - 41 U/L   ALT 20 0 - 44 U/L   Alkaline Phosphatase 55 38 - 126 U/L   Total Bilirubin 0.7 0.3 - 1.2 mg/dL   GFR, Estimated 53 (L) >60 mL/min    Comment: (NOTE) Calculated using the CKD-EPI Creatinine Equation (2021)    Anion gap 12 5 - 15    Comment: Performed at [facility], [place] [address], [place], [place] [address]  Urinalysis, Routine w reflex microscopic Urine, Clean Catch     Status: Abnormal   Collection Time: 05/02/21  6:10 PM  Result Value Ref Range   Color, Urine AMBER (A) YELLOW    Comment: BIOCHEMICALS MAY BE AFFECTED BY COLOR   APPearance CLEAR CLEAR   Specific Gravity, Urine 1.008 1.005 - 1.030   pH 6.0 5.0 - 8.0   Glucose, UA NEGATIVE NEGATIVE mg/dL   Hgb urine dipstick SMALL (A) NEGATIVE   Bilirubin Urine NEGATIVE NEGATIVE   Ketones, ur NEGATIVE NEGATIVE mg/dL   Protein, ur NEGATIVE NEGATIVE mg/dL   Nitrite POSITIVE (A) NEGATIVE   Leukocytes,Ua NEGATIVE NEGATIVE   RBC /  HPF 11-20 0 - 5 RBC/hpf   WBC, UA 0-5 0 - 5 WBC/hpf   Bacteria, UA RARE (A) NONE SEEN   Hyaline Casts, UA  PRESENT     Comment: Performed at [facility], [place] [address], [place], [place] [address]   No results found.  Pending Labs Unresulted Labs (From admission, onward)          Start     Ordered   05/03/21 0700  Gentamicin level, random  Once-Timed,   STAT        05/02/21 2307   05/03/21 0500  Hemoglobin A1c  Tomorrow morning,   R        05/02/21 2251   05/03/21 0500  TSH  Tomorrow morning,   R        05/02/21 2251   05/03/21 5638  Basic metabolic panel  Tomorrow morning,   R        05/02/21 2251   05/02/21 2301  Magnesium  Add-on,   AD        05/02/21 2300   05/02/21 2005  SARS CORONAVIRUS 2 (TAT 6-24 HRS) Nasopharyngeal Nasopharyngeal Swab  (Tier 3 - Symptomatic/asymptomatic)  Once,   STAT       Question Answer Comment  Is this test for diagnosis or screening Screening   Symptomatic for COVID-19 as defined by CDC No   Hospitalized for COVID-19 No   Admitted to ICU for COVID-19 No   Previously tested for COVID-19 Yes   Resident in a congregate (group) care setting No   Employed in healthcare setting No   Pregnant No   Has patient completed COVID vaccination(s) (2 doses of Pfizer/Moderna 1 dose of [employer]) Unknown      05/02/21 2004   05/02/21 1938  Urine culture  ONCE - STAT,   STAT        05/02/21 1938          Vitals/Pain Today's Vitals   05/02/21 1710 05/02/21 1720 05/02/21 1721 05/02/21 1833  BP: 136/82   126/79  Pulse: 92   87  Resp: 18   18  Temp: 98.5 F (36.9 C)     TempSrc: Oral     SpO2: 96%   97%  Weight:  80 kg    Height:  5' (1.524 m)    PainSc:   4      Isolation Precautions No active isolations  Medications Medications  metoprolol tartrate (LOPRESSOR) tablet 12.5 mg (12.5 mg Oral Given 05/02/21 2314)  terazosin (HYTRIN) capsule 5 mg (5 mg Oral Given 05/02/21 2316)  busPIRone (BUSPAR) tablet 5 mg (5 mg Oral  Given 05/02/21 2315)  levothyroxine (SYNTHROID) tablet 50 mcg (has no administration in time range)  enoxaparin (LOVENOX) injection 40 mg (has no administration in time range)  acetaminophen (TYLENOL) tablet 650 mg (has no administration in time range)    Or  acetaminophen (TYLENOL) suppository 650 mg (has no administration in time range)  0.9 %  sodium chloride infusion ( Intravenous New Bag/Given 05/02/21 2318)  gentamicin (GARAMYCIN) 300 mg in dextrose 5 % 100 mL IVPB (0 mg/kg  59.3 kg (Adjusted) Intravenous Stopped 05/02/21 2157)    Mobility non-ambulatory

## 2021-05-02 NOTE — ED Provider Notes (Signed)
[facility] DEPT Provider Note   CSN: [id number] Arrival date & time: 05/02/21  1658     History No chief complaint on file.   Alyssa Schmitt is a 87 y.o. female.  ED Alyssa Schmitt also states that today she was so weak she could not get up.  She denies any other symptoms.  She lives at home with her daughter.  She is able to tell me her recent medical story.  She was admitted to the hospital after becoming hypotensive during a cataract surgery.  She was discharged to a skilled nursing facility and was ultimately then discharged home.  Obtain further history from the patient's daughter, and she has actually been receiving ertapenem for her infection.  Apparently, the species obtained by the skilled rehab facility was E. coli.  The history is provided by the patient.  Dysuria Pain quality:  Burning Pain severity:  Moderate Onset quality:  Gradual Timing:  Constant Progression:  Unchanged Chronicity:  New Recent urinary tract infections: yes   Relieved by:  Nothing Worsened by:  Nothing Ineffective treatments:  Antibiotics Urinary symptoms: no hematuria   Associated symptoms: abdominal pain (lower abdominal/suprapubic)   Associated symptoms: no fever, no flank pain, no nausea and no vomiting        Past Medical History:  Diagnosis Date  . COPD (chronic obstructive pulmonary disease) ([place])   . GERD (gastroesophageal reflux disease)   . Hyperlipidemia   . Hypertension   . Morbid obesity ([place]) 08/01/2014  . Osteoporosis   . Prediabetes   . Vitamin D deficiency     Patient Active Problem List   Diagnosis Date Noted  . Hypoxemia 04/12/2021  . Acute respiratory failure with hypoxia ([place]) 04/11/2021  . Pressure injury of skin 04/11/2021  . Environmental allergies 09/26/2020  . Combined form of age-related cataract, left eye 09/10/2020  . Acquired female bladder prolapse 03/06/2020  . Pressure injury of buttock, stage 1 03/06/2020  .  Tear of skin of right buttock 10/05/2018  . Intertriginous candidiasis 10/05/2018  . Elevated LFTs 08/26/2018  . Hypertensive retinopathy of both eyes 09/21/2017  . Depression, major, in remission ([place]) 06/22/2016  . COPD 08/15/2015  . GERD  08/15/2015  . Osteoporosis 07/16/2015  . Hypothyroidism   . Medication management 03/20/2014  . Essential hypertension   . Hyperlipidemia, mixed   . Abnormal glucose   . Vitamin D deficiency   . Varicose veins of bilateral lower extremities with other complications 89/38/1017    Past Surgical History:  Procedure Laterality Date  . TONSILLECTOMY     childhood  . VESICOVAGINAL FISTULA CLOSURE W/ [facility]       OB History   No obstetric history on file.     Family History  Problem Relation Age of Onset  . Heart disease Mother   . Other Mother        VARICOSE VEINS  . Diabetes Father   . Other Brother        VARICOSE VEINS    Social History   Tobacco Use  . Smoking status: Never Smoker  . Smokeless tobacco: Never Used  Substance Use Topics  . Alcohol use: No  . Drug use: No    Home Medications Prior to Admission medications   Medication Sig Start Date End Date Taking? Authorizing Provider  acetaminophen (TYLENOL) 325 MG tablet Take 2 tablets (650 mg total) by mouth every 6 (six) hours as needed (prn TEMP>38.1 Celsius). 12/21/14   Reyne Dumas, MD  calcium-vitamin D (OSCAL WITH D) 250-125 MG-UNIT per tablet Take 1 tablet by mouth daily.    [provider]  Cholecalciferol (VITAMIN D3) 2000 UNITS TABS Take 4 tablets by mouth daily.     [provider]  D-Mannose 500 MG CAPS Take 1 tablet by mouth daily.    [provider]  fexofenadine (ALLEGRA) 180 MG tablet Take 180 mg by mouth daily.     [provider]  furosemide (LASIX) 20 MG tablet Take 1 tablet (20 mg total) by mouth daily. 04/15/21   Regalado, Belkys A, MD  levothyroxine (SYNTHROID) 50 MCG tablet TAKE 1 TABLET DAILY ON AN EMPTY STOMACH  WITH ONLY WATER FOR 30 MINUTES & NO ANTACID MEDS, CALCIUM OR MAGNESIUM FOR 4 HOURS & AVOID BIOTIN Patient taking differently: Take 50 mcg by mouth daily before breakfast. Take 1 tablet daily on an empty stomach with only water for 30 minutes & no Antacid meds, Calcium or Magnesium for 4 hours & avoid Biotin 02/24/21   McClanahan, Danton Sewer, NP  magnesium oxide (MAG-OX) 400 (241.3 MG) MG tablet Take 1 tablet (400 mg total) by mouth 2 (two) times daily. Patient taking differently: No sig reported 12/21/14   Reyne Dumas, MD  metoprolol tartrate (LOPRESSOR) 25 MG tablet Take 0.5 tablets (12.5 mg total) by mouth 2 (two) times daily. 04/15/21   Regalado, Belkys A, MD  Multiple Vitamin (MULITIVITAMIN WITH MINERALS) TABS Take 1 tablet by mouth daily.    [provider]  nystatin (MYCOSTATIN/NYSTOP) powder Apply daily after a shower as needed Patient taking differently: Apply 1 application topically 2 (two) times daily. Apply daily after a shower as needed 01/13/21   [name], NP  omeprazole (PRILOSEC) 40 MG capsule TAKE 1 CAPSULE BY MOUTH EVERY DAY Patient taking differently: Take 40 mg by mouth daily. 04/11/20   [name], NP  potassium chloride SA (KLOR-CON) 20 MEQ tablet Take 1 tablet (20 mEq total) by mouth daily. 04/12/21   Caren Griffins, MD  Probiotic Product (PROBIOTIC FORMULA PO) Take 1 tablet by mouth daily.     [provider]  senna-docusate (SENOKOT-S) 8.6-50 MG tablet Take 1 tablet by mouth daily. 04/15/21   Regalado, Belkys A, MD  terazosin (HYTRIN) 10 MG capsule TAKE 1 TABLET AT BEDTIME FOR BP Patient taking differently: Take 10 mg by mouth at bedtime. 10/16/20   [name], NP  zinc gluconate 50 MG tablet Take 50 mg by mouth daily.    [provider]    Allergies    Aspirin, Celebrex [celecoxib], Fosamax [alendronate sodium], Keflex [cephalexin], Penicillins, Prednisone, Prevacid [lansoprazole], Sulfa antibiotics, Tetanus toxoids, and Tetracyclines &  related  Review of Systems   Review of Systems  Constitutional: Negative for chills and fever.  HENT: Negative for ear pain and sore throat.   Eyes: Negative for pain and visual disturbance.  Respiratory: Negative for cough and shortness of breath.   Cardiovascular: Negative for chest pain and palpitations.  Gastrointestinal: Positive for abdominal pain (lower abdominal/suprapubic). Negative for nausea and vomiting.  Genitourinary: Positive for dysuria. Negative for flank pain and hematuria.  Musculoskeletal: Negative for arthralgias and back pain.  Skin: Negative for color change and rash.  Neurological: Positive for weakness. Negative for seizures and syncope.  All other systems reviewed and are negative.   Physical Exam Updated Vital Signs BP 136/82 (BP Location: Left Arm)   Pulse 92   Temp 98.5 F (36.9 C) (Oral)   Resp 18   Ht 5' (1.524 m)  Wt 80 kg   SpO2 96%   BMI 34.44 kg/m   Physical Exam Vitals and nursing note reviewed.  Constitutional:      General: She is not in acute distress.    Appearance: She is well-developed.  HENT:     Head: Normocephalic and atraumatic.  Eyes:     Conjunctiva/sclera: Conjunctivae normal.  Cardiovascular:     Rate and Rhythm: Normal rate and regular rhythm.     Heart sounds: No murmur heard.   Pulmonary:     Effort: Pulmonary effort is normal. No respiratory distress.     Breath sounds: Normal breath sounds.  Abdominal:     Palpations: Abdomen is soft.     Tenderness: There is no abdominal tenderness.  Genitourinary:    Comments: She is wearing a saturated pad with a depends.  External genital area appears to be very mildly erythematous.  She has a large pelvic organ prolapse that is easily reducible.  However, the exposed uterine tissue appears to be excoriated and friable. Musculoskeletal:        General: No deformity or signs of injury.     Cervical back: Neck supple.  Skin:    General: Skin is warm and dry.      Comments: Discoloration of both lower legs consistent with mild venous insufficiency  Neurological:     General: No focal deficit present.     Mental Status: She is alert and oriented to person, place, and time.  Psychiatric:        Mood and Affect: Mood normal.        Behavior: Behavior normal.     ED Results / Procedures / Treatments   Labs (all labs ordered are listed, but only abnormal results are displayed) Labs Reviewed  COMPREHENSIVE METABOLIC PANEL - Abnormal; Notable for the following components:      Result Value   Glucose, Bld 118 (*)    Creatinine, Ser 1.01 (*)    GFR, Estimated 53 (*)    All other components within normal limits  URINALYSIS, ROUTINE W REFLEX MICROSCOPIC - Abnormal; Notable for the following components:   Color, Urine AMBER (*)    Hgb urine dipstick SMALL (*)    Nitrite POSITIVE (*)    Bacteria, UA RARE (*)    All other components within normal limits  URINE CULTURE  CBC WITH DIFFERENTIAL/PLATELET    EKG EKG Interpretation  Date/Time:  Friday May 02 2021 18:23:31 EDT Ventricular Rate:  86 PR Interval:  206 QRS Duration: 122 QT Interval:  418 QTC Calculation: 500 R Axis:   18 Text Interpretation: Normal sinus rhythm Anterolateral infarct , age undetermined Abnormal ECG similar to prior Confirmed by Lorre Munroe ([id number]) on 05/02/2021 7:58:30 PM   Radiology No results found.  Procedures Procedures   Medications Ordered in ED Medications - No data to display  ED Course  I have reviewed the triage vital signs and the nursing notes.  Pertinent labs & imaging results that were available during my care of the patient were reviewed by me and considered in my medical decision making (see chart for details).  Clinical Course as of 05/03/21 1553  Fri May 02, 2021  1958 I spoke with [facility] who will admit.  [AW]    Clinical Course User Index [AW] Arnaldo Natal, MD   MDM Rules/Calculators/A&P                          Regino Schultze  F Avino  presents with dysuria and weakness.  She has recently been in a skilled nursing facility where genital hygiene has not optimal.  She has known, chronic pelvic organ prolapse.  I think this combination contributed to a urinary tract infection.  Unfortunately, she has been treated with ertapenem x4 days without improvement.  She still has nitrite positive urinalysis.  Due to this fact as well as her weakness and inability to walk which is a functional decline for her, she will be admitted for further observation. Final Clinical Impression(s) / ED Diagnoses Final diagnoses:  Acute cystitis with hematuria  Weakness    Rx / DC Orders ED Discharge Orders    None       Arnaldo Natal, MD 05/03/21 [phone]

## 2021-05-02 NOTE — Progress Notes (Signed)
Pharmacy Antibiotic Note  Alyssa Schmitt is a 87 y.o. female admitted on 05/02/2021 with medical history significant of chronic diastolic heart failure, hypertension, hypothyroidism, COPD, obesity (BMI 34.44), GERD, prediabetes presented to the ED via EMS complaining of dysuria and generalized weakness.   Pharmacy has been consulted to dose gentamicin for UTI.  Plan: Pt received a dose of gent earlier this evening, will check a 10 hour random level and calculate dose from there  Height: 5' (152.4 cm) Weight: 80 kg (176 lb 5.9 oz) IBW/kg (Calculated) : 45.5  Temp (24hrs), Avg:98.5 F (36.9 C), Min:98.5 F (36.9 C), Max:98.5 F (36.9 C)  Recent Labs  Lab 05/02/21 1804  WBC 7.8  CREATININE 1.01*    Estimated Creatinine Clearance: 34.7 mL/min (A) (by C-G formula based on SCr of 1.01 mg/dL (H)).    Allergies  Allergen Reactions  . Aspirin Swelling  . Celebrex [Celecoxib] Other (See Comments)    "my body lit up"  . Fosamax [Alendronate Sodium] Other (See Comments)    unknown  . Keflex [Cephalexin] Other (See Comments)    unknown  . Penicillins Other (See Comments)    unknown  . Prednisone Itching  . Prevacid [Lansoprazole] Other (See Comments)    unknown  . Sulfa Antibiotics Other (See Comments)    unknown  . Tetanus Toxoids Other (See Comments)    unknown  . Tetracyclines & Related Nausea Only         Antimicrobials this admission: 5/27 gent >>    Dose adjustments this admission:   Microbiology results: 5/27 UCx:   Thank you for allowing pharmacy to be a part of this patient's care.  Dolly Rias RPh 05/02/2021, 11:09 PM

## 2021-05-02 NOTE — ED Notes (Signed)
Straight cathed with MD Joya Gaskins at bedside. Peri care provided.

## 2021-05-02 NOTE — ED Triage Notes (Signed)
Per EMS-just finished a coarse of antibiotics for a UTI-states she is not getting better-painful urination and strong odor

## 2021-05-03 DIAGNOSIS — N179 Acute kidney failure, unspecified: Secondary | ICD-10-CM | POA: Diagnosis not present

## 2021-05-03 DIAGNOSIS — R7303 Prediabetes: Secondary | ICD-10-CM | POA: Diagnosis not present

## 2021-05-03 DIAGNOSIS — E039 Hypothyroidism, unspecified: Secondary | ICD-10-CM | POA: Diagnosis not present

## 2021-05-03 DIAGNOSIS — J449 Chronic obstructive pulmonary disease, unspecified: Secondary | ICD-10-CM | POA: Diagnosis not present

## 2021-05-03 DIAGNOSIS — I110 Hypertensive heart disease with heart failure: Secondary | ICD-10-CM | POA: Diagnosis not present

## 2021-05-03 DIAGNOSIS — Z79899 Other long term (current) drug therapy: Secondary | ICD-10-CM | POA: Diagnosis not present

## 2021-05-03 DIAGNOSIS — B9689 Other specified bacterial agents as the cause of diseases classified elsewhere: Secondary | ICD-10-CM | POA: Diagnosis not present

## 2021-05-03 DIAGNOSIS — Z20822 Contact with and (suspected) exposure to covid-19: Secondary | ICD-10-CM | POA: Diagnosis not present

## 2021-05-03 DIAGNOSIS — I5032 Chronic diastolic (congestive) heart failure: Secondary | ICD-10-CM | POA: Diagnosis not present

## 2021-05-03 DIAGNOSIS — R531 Weakness: Secondary | ICD-10-CM | POA: Diagnosis not present

## 2021-05-03 DIAGNOSIS — N390 Urinary tract infection, site not specified: Secondary | ICD-10-CM | POA: Diagnosis not present

## 2021-05-03 LAB — URINALYSIS, ROUTINE W REFLEX MICROSCOPIC
Bilirubin Urine: NEGATIVE
Glucose, UA: NEGATIVE
Ketones, ur: NEGATIVE
Nitrite: POSITIVE — AB
Specific Gravity, Urine: 1.023 (ref 1.001–1.035)
pH: <=5 (ref 5.0–8.0)

## 2021-05-03 LAB — TSH: TSH: 2.966 u[IU]/mL (ref 0.350–4.500)

## 2021-05-03 LAB — GENTAMICIN LEVEL, RANDOM: Gentamicin Rm: 6.4 ug/mL

## 2021-05-03 LAB — BASIC METABOLIC PANEL
Anion gap: 9 (ref 5–15)
BUN: 17 mg/dL (ref 8–23)
CO2: 25 mmol/L (ref 22–32)
Calcium: 9.2 mg/dL (ref 8.9–10.3)
Chloride: 101 mmol/L (ref 98–111)
Creatinine, Ser: 0.55 mg/dL (ref 0.44–1.00)
GFR, Estimated: >60 mL/min (ref >60)
Glucose, Bld: 84 mg/dL (ref 70–99)
Potassium: 3 mmol/L — ABNORMAL LOW (ref 3.5–5.1)
Sodium: 135 mmol/L (ref 135–145)

## 2021-05-03 LAB — MICROSCOPIC MESSAGE

## 2021-05-03 LAB — MAGNESIUM: Magnesium: 2 mg/dL (ref 1.7–2.4)

## 2021-05-03 LAB — URINE CULTURE
MICRO NUMBER:: 11944495
Result:: NO GROWTH
SPECIMEN QUALITY:: ADEQUATE

## 2021-05-03 LAB — MRSA PCR SCREENING: MRSA by PCR: POSITIVE — AB

## 2021-05-03 LAB — SARS CORONAVIRUS 2 (TAT 6-24 HRS): SARS Coronavirus 2: NEGATIVE

## 2021-05-03 MED ORDER — MUPIROCIN 2 % EX OINT
1.0000 "application " | TOPICAL_OINTMENT | Freq: Two times a day (BID) | CUTANEOUS | Status: DC
  Administered 2021-05-04 (×2): 1 via NASAL
  Filled 2021-05-03: qty 22

## 2021-05-03 MED ORDER — CHLORHEXIDINE GLUCONATE CLOTH 2 % EX PADS: 6.0000 | MEDICATED_PAD | Freq: Every day | CUTANEOUS | Status: DC

## 2021-05-03 MED ORDER — SODIUM CHLORIDE 0.9 % IV SOLN
2.0000 g | INTRAVENOUS | Status: DC
  Administered 2021-05-03: 2 g via INTRAVENOUS
  Filled 2021-05-03: qty 2

## 2021-05-03 MED ORDER — POTASSIUM CHLORIDE CRYS ER 20 MEQ PO TBCR
40.0000 meq | EXTENDED_RELEASE_TABLET | ORAL | Status: AC
  Administered 2021-05-03 (×2): 40 meq via ORAL
  Filled 2021-05-03 (×2): qty 2

## 2021-05-03 NOTE — Evaluation (Signed)
Physical Therapy Evaluation Patient Details Name: Alyssa Schmitt MRN: [id number] DOB: Jun 01, 1928 Today's Date: 05/03/2021   History of Present Illness  Alyssa Schmitt is a 87 y.o. female with medical history significant of chronic diastolic heart failure, hypertension, hypothyroidism, COPD, obesity (BMI 34.44), GERD, prediabetes presented to the ED 05/02/21 via EMS complaining of dysuria and generalized weakness.  Severe pelvic organ prolapse with a tender and excoriated uterus noted on pelvic exam.  In hospital  5/6 for hypoxia/hypotension following cataract surgery.  Clinical Impression  The patient requires 2 max assist for bed mobility. Able to stand and take small steps to BSC/recliner with 2 mod/max assist for rising and stabilizing.  Patient recently Jenkins from SNF. Per char, patient to return home.   Pt admitted with above diagnosis.   Pt currently with functional limitations due to the deficits listed below (see PT Problem List). Pt will benefit from skilled PT to increase their independence and safety with mobility to allow discharge to the venue listed below.       Follow Up Recommendations Home health PT    Equipment Recommendations  None recommended by PT    Recommendations for Other Services       Precautions / Restrictions Precautions Precautions: Fall Precaution Comments: incontinent,      Mobility  Bed Mobility   Bed Mobility: Supine to Sit     Supine to sit: Max assist;+2 for safety/equipment;+2 for physical assistance     General bed mobility comments: MaxA + 2, use of bed pad to scoot hips, assist for BLE's and trunk to edge of bed    Transfers Overall transfer level: Needs assistance Equipment used: Rolling walker (2 wheeled) Transfers: Sit to/from [facility] Sit to Stand: Mod assist;+2 physical assistance Stand pivot transfers: Max assist;+2 physical assistance       General transfer comment: mod max to stand from bed and low  BSC, pivot steps  to [facility] then to recliner. Slow   to take smmall steps, Cues for posture. Steady assist for balance.  Ambulation/Gait                Stairs            Wheelchair Mobility    Modified Rankin (Stroke Patients Only)       Balance Overall balance assessment: Needs assistance Sitting-balance support: Bilateral upper extremity supported;Feet supported Sitting balance-Leahy Scale: Poor Sitting balance - Comments: seated on bed edge, significant kyphosis Postural control: Posterior lean Standing balance support: Bilateral upper extremity supported;During functional activity Standing balance-Leahy Scale: Poor Standing balance comment: maxA with BUE support                             Pertinent Vitals/Pain Faces Pain Scale: Hurts little more Pain Location: back and neck Pain Descriptors / Indicators: Discomfort;Sore Pain Intervention(s): Monitored during session;Limited activity within patient's tolerance;Premedicated before session (has a pain patch on neck)    Home Living Family/patient expects to be discharged to:: Private residence Living Arrangements: Children Available Help at Discharge: Family;Personal care attendant;Available 24 hours/day Type of Home: House Home Access: Stairs to enter Entrance Stairs-Rails: Left Entrance Stairs-Number of Steps: 2 Home Layout: Able to live on main level with bedroom/bathroom Home Equipment: Walker - 2 wheels;Bedside commode;Wheelchair - manual;Hospital bed Additional Comments: Pt sleeps in lift chair, (Info from previous encounter)    Prior Function Level of Independence: Needs assistance   Gait / Transfers Assistance Needed:  recently DC'd from SNF, Unsure how far she ambulates ,  ADL's / Homemaking Assistance Needed: assist for ADLs from CNA or daughter        Hand Dominance        Extremity/Trunk Assessment        Lower Extremity Assessment Lower Extremity Assessment: RLE  deficits/detail;LLE deficits/detail RLE Deficits / Details: leg is externally rotated, significant  knee varus, ablwe WB LLE Deficits / Details: generalized weakness    Cervical / Trunk Assessment Cervical / Trunk Assessment: Kyphotic  Communication   Communication: No difficulties  Cognition Arousal/Alertness: Awake/alert Behavior During Therapy: WFL for tasks assessed/performed Overall Cognitive Status: No family/caregiver present to determine baseline cognitive functioning                           Safety/Judgement: Decreased awareness of safety   Problem Solving: Slow processing;Requires verbal cues        General Comments      Exercises     Assessment/Plan    PT Assessment Patient needs continued PT services  PT Problem List Decreased strength;Decreased range of motion;Decreased activity tolerance;Decreased balance;Decreased mobility;Decreased knowledge of use of DME;Decreased safety awareness       PT Treatment Interventions DME instruction;Gait training;Stair training;Functional mobility training;Therapeutic activities;Therapeutic exercise;Balance training;Patient/family education    PT Goals (Current goals can be found in the Care Plan section)  Acute Rehab PT Goals Patient Stated Goal: go home PT Goal Formulation: With patient Time For Goal Achievement: 05/17/21 Potential to Achieve Goals: Good    Frequency Min 3X/week   Barriers to discharge        Co-evaluation PT/OT/SLP Co-Evaluation/Treatment: Yes Reason for Co-Treatment: For patient/therapist safety PT goals addressed during session: Mobility/safety with mobility OT goals addressed during session: ADL's and self-care       AM-PAC PT "6 Clicks" Mobility  Outcome Measure Help needed turning from your back to your side while in a flat bed without using bedrails?: A Lot Help needed moving from lying on your back to sitting on the side of a flat bed without using bedrails?: A Lot Help  needed moving to and from a bed to a chair (including a wheelchair)?: Total Help needed standing up from a chair using your arms (e.g., wheelchair or bedside chair)?: Total Help needed to walk in hospital room?: Total Help needed climbing 3-5 steps with a railing? : Total 6 Click Score: 8    End of Session Equipment Utilized During Treatment: Gait belt Activity Tolerance: Patient tolerated treatment well Patient left: in chair;with call bell/phone within reach;with chair alarm set Nurse Communication: Mobility status PT Visit Diagnosis: Other abnormalities of gait and mobility (R26.89);Muscle weakness (generalized) (M62.81)    Time: 5409-8119 PT Time Calculation (min) (ACUTE ONLY): 19 min   Charges:   PT Evaluation $PT Eval Low Complexity: [place] PT Acute Rehabilitation Services Pager [phone] Office [phone]   Claretha Cooper 05/03/2021, 10:33 AM

## 2021-05-03 NOTE — Plan of Care (Signed)
  Problem: Clinical Measurements: Goal: Cardiovascular complication will be avoided Outcome: Progressing   

## 2021-05-03 NOTE — Evaluation (Signed)
Occupational Therapy Evaluation Patient Details Name: Alyssa Schmitt MRN: [id number] DOB: 07/12/1928 Today's Date: 05/03/2021    History of Present Illness Alyssa Schmitt is a 87 y.o. female with medical history significant of chronic diastolic heart failure, hypertension, hypothyroidism, COPD, obesity (BMI 34.44), GERD, prediabetes presented to the ED 05/02/21 via EMS complaining of dysuria and generalized weakness.  Severe pelvic organ prolapse with a tender and excoriated uterus noted on pelvic exam.  In hospital  5/6 for hypoxia/hypotension following cataract surgery.   Clinical Impression   Pt walks with assist and RW at her baseline. She is dependent in bathing, dressing and toileting, but is able to self feed and groom with set up. She presents with generalized weakness, back pain and poor sitting and standing balance. Pt transferred x 2 with +2 max assist with RW. Per chart, plan is take pt back to her home with her daughter and a caregiver. Will follow to address transfers for toileting.    Follow Up Recommendations  No OT follow up    Equipment Recommendations   (hoyer lift)    Recommendations for Other Services       Precautions / Restrictions Precautions Precautions: Fall Precaution Comments: incontinent,      Mobility Bed Mobility Overal bed mobility: Needs Assistance Bed Mobility: Supine to Sit     Supine to sit: +2 for physical assistance;Max assist     General bed mobility comments: MaxA + 2, use of bed pad to scoot hips, assist for BLE's and trunk to edge of bed    Transfers Overall transfer level: Needs assistance Equipment used: Rolling walker (2 wheeled) Transfers: Sit to/from [facility] Sit to Stand: +2 physical assistance;Max assist Stand pivot transfers: +2 physical assistance;Mod assist       General transfer comment: assist to rise and steady from elevated bed, took pivotal steps to [facility] and then to recliner    Balance  Overall balance assessment: Needs assistance Sitting-balance support: Bilateral upper extremity supported;Feet supported Sitting balance-Leahy Scale: Poor Sitting balance - Comments: max to min assist Postural control: Posterior lean Standing balance support: Bilateral upper extremity supported Standing balance-Leahy Scale: Zero Standing balance comment: maxA with BUE support                           ADL either performed or assessed with clinical judgement   ADL Overall ADL's : Needs assistance/impaired Eating/Feeding: Set up;Supervision/ safety;Sitting   Grooming: Wash/dry hands;Wash/dry face;Sitting;Set up                   Toilet Transfer: Maximal assistance;+2 for physical assistance;Stand-pivot;RW   Toileting- Clothing Manipulation and Hygiene: Total assistance;+2 for physical assistance;Sit to/from stand         General ADL Comments: Pt is dependent in bathing, dressing and toileting at baseline.     Vision Baseline Vision/History: Cataracts Patient Visual Report: No change from baseline       Perception     Praxis      Pertinent Vitals/Pain Pain Assessment: Faces Faces Pain Scale: Hurts little more Pain Location: back Pain Descriptors / Indicators: Discomfort;Sore Pain Intervention(s): Monitored during session;Repositioned     Hand Dominance Right   Extremity/Trunk Assessment Upper Extremity Assessment Upper Extremity Assessment: Generalized weakness (arthritic deformities in hands)   Lower Extremity Assessment Lower Extremity Assessment: Defer to PT evaluation RLE Deficits / Details: leg is externally rotated, significant  knee varus, ablwe WB LLE Deficits / Details: generalized  weakness   Cervical / Trunk Assessment Cervical / Trunk Assessment: Kyphotic;Other exceptions Cervical / Trunk Exceptions: chronic back pain   Communication Communication Communication: No difficulties   Cognition Arousal/Alertness:  Awake/alert Behavior During Therapy: Flat affect Overall Cognitive Status: No family/caregiver present to determine baseline cognitive functioning                           Safety/Judgement: Decreased awareness of safety   Problem Solving: Slow processing;Requires verbal cues General Comments: impaired memory, increased processing speed   General Comments       Exercises     Shoulder Instructions      Home Living Family/patient expects to be discharged to:: Private residence Living Arrangements: Children Available Help at Discharge: Family;Personal care attendant;Available 24 hours/day Type of Home: House Home Access: Stairs to enter [employer] of Steps: 2 Entrance Stairs-Rails: Left Home Layout: Able to live on main level with bedroom/bathroom     Bathroom Shower/Tub: Tub/shower unit         Home Equipment: Environmental consultant - 2 wheels;Bedside commode;Wheelchair - Education administrator (comment) (lift chair)   Additional Comments: Pt sleeps in lift chair      Prior Functioning/Environment Level of Independence: Needs assistance  Gait / Transfers Assistance Needed: ambulates with assist and use of RW ADL's / Homemaking Assistance Needed: assist for ADL from CNA or daughter, pt can self feed and groom with set up            OT Problem List: Decreased strength;Decreased range of motion;Decreased activity tolerance;Impaired balance (sitting and/or standing);Decreased knowledge of use of DME or AE;Decreased knowledge of precautions      OT Treatment/Interventions: Self-care/ADL training;DME and/or AE instruction    OT Goals(Current goals can be found in the care plan section) Acute Rehab OT Goals Patient Stated Goal: return home ADL Goals Pt Will Transfer to Toilet: with mod assist;stand pivot transfer;bedside commode  OT Frequency: Min 2X/week   Barriers to D/C:            Co-evaluation PT/OT/SLP Co-Evaluation/Treatment: Yes Reason for Co-Treatment:  For patient/therapist safety PT goals addressed during session: Mobility/safety with mobility OT goals addressed during session: ADL's and self-care      AM-PAC OT "6 Clicks" Daily Activity     Outcome Measure Help from another person eating meals?: A Little Help from another person taking care of personal grooming?: A Little Help from another person toileting, which includes using toliet, bedpan, or urinal?: Total Help from another person bathing (including washing, rinsing, drying)?: A Lot Help from another person to put on and taking off regular upper body clothing?: A Lot Help from another person to put on and taking off regular lower body clothing?: Total 6 Click Score: 12   End of Session Equipment Utilized During Treatment: Gait belt;Rolling walker Nurse Communication: Mobility status  Activity Tolerance: Patient tolerated treatment well Patient left: in chair;with call bell/phone within reach;with chair alarm set  OT Visit Diagnosis: Unsteadiness on feet (R26.81);Other abnormalities of gait and mobility (R26.89);Muscle weakness (generalized) (M62.81)                Time: 8366-2947 OT Time Calculation (min): 18 min Charges:  OT General Charges $OT Visit: 1 Visit OT Evaluation $OT Eval Moderate Complexity: 1 Mod  Nestor Lewandowsky, OTR/L Acute Rehabilitation Services Pager: [phone] Office: [phone]  Malka So 05/03/2021, 11:00 AM

## 2021-05-03 NOTE — Progress Notes (Signed)
PROGRESS NOTE    Alyssa Schmitt  DZH:[id number] DOB: 1928/08/29 DOA: 05/02/2021 PCP: Unk Pinto, MD   Brief Narrative:  HPI: Alyssa Schmitt is a 87 y.o. female with medical history significant of chronic diastolic heart failure, hypertension, hypothyroidism, COPD, obesity (BMI 34.44), GERD, prediabetes presented to the ED via EMS complaining of dysuria and generalized weakness. In the ED, vital signs stable.  Severe pelvic organ prolapse with a tender and excoriated uterus noted on pelvic exam.  Labs showing WBC 7.8, hemoglobin 14.5, platelet count 173K.  Sodium 136, potassium 3.7, chloride 98, bicarb 26, BUN 22, creatinine 1.0 (baseline 0.5-0.6), glucose 118.  LFTs normal.  UA showing positive nitrite, negative leukocytes, 0-5 WBCs, and rare bacteria.  Urine culture pending.  SARS-CoV-2 PCR test pending. Patient was given gentamicin (based on allergy profile and previous urine culture results).  Patient is complaining of dysuria.  History provided mostly by daughter at bedside who states that the patient was recently at a nursing facility and discharged a few days ago.  While at the nursing facility, she was diagnosed with UTI due to E. coli and was started on ertapenem.  Up until a few days ago patient was able to ambulate with a walker but since this morning she has been very weak and barely able to use her legs.  She is complaining of dysuria.  Per daughter, patient has a history of recurrent UTIs and also history of pelvic organ prolapse for which she was by a gynecologist 15 years ago and a pessary was placed which subsequently had to be removed as patient started having severe vaginal bleeding.  She is not seeing a gynecologist at present.  Per daughter, patient has not had any fevers, cough, shortness of breath, vomiting, diarrhea, or abdominal pain.  She is vaccinated against COVID.  Daughter does not want the patient to go back to a nursing facility and prefers that she goes back home  with home health services.   Assessment & Plan:   Principal Problem:   UTI (urinary tract infection) Active Problems:   Hypothyroidism   AKI (acute kidney injury) ([place])   Prolapse of female pelvic organs   Generalized weakness   Possible UTI: She tells me that her dysuria has resolved.  Urine culture still pending.  Continue gentamicin and follow culture.  Severe pelvic organ prolapse: Called to consult on-call gynecologist and was able to speak to Dr. Elly Modena who recommended patient to follow-up as outpatient.  She told me that most likely her office will contact patient and set up an appointment within 2 weeks but also provided the phone #[phone] for the patient to call if she does not hear from them.  AKI: Resolved.  Generalized weakness Possibly due to UTI.  PT OT recommends home health PT.  Chronic diastolic heart failure Echo done in April 2013 showing normal systolic function with EF 55 to 60% and grade 1 diastolic dysfunction.  No signs of volume overload at this time.  Hold diuretics for 1 more day.  Hypertension: Controlled.  Continue metoprolol.   Hypothyroidism: Continue Synthroid.  Prediabetes: Hemoglobin A1c pending.  QT prolongation -Cardiac monitoring.  Monitor potassium and magnesium levels.  Avoid QT prolonging drugs if possible.  Repeat EKG in AM.  DVT prophylaxis: enoxaparin (LOVENOX) injection 40 mg Start: 05/03/21 0600   Code Status: DNR  Family Communication:  None present at bedside.  Plan of care discussed with patient in length and he verbalized understanding and agreed with it.  Also called and left voicemail to the daughter.  Status is: Observation  The patient will require care spanning > 2 midnights and should be moved to inpatient because: Inpatient level of care appropriate due to severity of illness  Dispo: The patient is from: Home              Anticipated d/c is to: Home              Patient currently is not medically stable  to d/c.   Difficult to place patient No        Estimated body mass index is 28.2 kg/m as calculated from the following:   Height as of this encounter: 5' (1.524 m).   Weight as of this encounter: 65.5 kg.  Pressure Injury 04/11/21 Buttocks Stage 1 -  Intact skin with non-blanchable redness of a localized area usually over a bony prominence. (Active)  04/11/21 0030  Location: Buttocks  Location Orientation:   Staging: Stage 1 -  Intact skin with non-blanchable redness of a localized area usually over a bony prominence.  Wound Description (Comments):   Present on Admission: Yes     Nutritional status:               Consultants:   None  Procedures:   None  Antimicrobials:  Anti-infectives (From admission, onward)   Start     Dose/Rate Route Frequency Ordered Stop   05/03/21 0000  gentamicin (GARAMYCIN) 420 mg in dextrose 5 % 100 mL IVPB  Status:  Discontinued        7 mg/kg  59.3 kg (Adjusted) 110.5 mL/hr over 60 Minutes Intravenous  Once 05/02/21 2257 05/02/21 2300   05/02/21 2015  gentamicin (GARAMYCIN) 300 mg in dextrose 5 % 100 mL IVPB        5 mg/kg  59.3 kg (Adjusted) 107.5 mL/hr over 60 Minutes Intravenous  Once 05/02/21 2006 05/02/21 2157   05/02/21 2000  gentamicin (GARAMYCIN) 230 mg in dextrose 5 % 50 mL IVPB  Status:  Discontinued        5 mg/kg  45.5 kg (Ideal) 111.5 mL/hr over 30 Minutes Intravenous  Once 05/02/21 1956 05/02/21 2005         Subjective: Seen and examined.  Feels better than yesterday but still with minimal dysuria and some weakness.  Objective: Vitals:   05/03/21 0028 05/03/21 0450 05/03/21 1008 05/03/21 1104  BP: 126/62 124/66 124/72 118/75  Pulse: 68 74 75 77  Resp: 18 20 20 16   Temp: 98.2 F (36.8 C) 97.8 F (36.6 C) 98.1 F (36.7 C)   TempSrc: Oral Oral    SpO2: 93% 95% 92% 97%  Weight: 65.5 kg     Height:        Intake/Output Summary (Last 24 hours) at 05/03/2021 1145 Last data filed at 05/03/2021  1112 Gross per 24 hour  Intake 622.5 ml  Output --  Net 622.5 ml   Filed Weights   05/02/21 1720 05/03/21 0028  Weight: 80 kg 65.5 kg    Examination:  General exam: Appears calm and comfortable  Respiratory system: Clear to auscultation. Respiratory effort normal. Cardiovascular system: S1 & S2 heard, RRR. No JVD, murmurs, rubs, gallops or clicks. No pedal edema. Gastrointestinal system: Abdomen is nondistended, soft and nontender. No organomegaly or masses felt. Normal bowel sounds heard.  Uterine prolapse.  Excoriations on the prolapsed part. Central nervous system: Alert and oriented. No focal neurological deficits. Extremities: Symmetric 5 x 5 power. Skin: No  rashes, lesions or ulcers Psychiatry: Judgement and insight appear normal. Mood & affect appropriate.    Data Reviewed: I have personally reviewed following labs and imaging studies  CBC: Recent Labs  Lab 05/02/21 1804  WBC 7.8  NEUTROABS 5.4  HGB 14.5  HCT 44.3  MCV 91.0  PLT 024   Basic Metabolic Panel: Recent Labs  Lab 05/02/21 1804 05/03/21 0631  NA 136 135  K 3.7 3.0*  CL 98 101  CO2 26 25  GLUCOSE 118* 84  BUN 22 17  CREATININE 1.01* 0.55  CALCIUM 10.0 9.2  MG 2.0  --    GFR: Estimated Creatinine Clearance: 39.5 mL/min (by C-G formula based on SCr of 0.55 mg/dL). Liver Function Tests: Recent Labs  Lab 05/02/21 1804  AST 32  ALT 20  ALKPHOS 55  BILITOT 0.7  PROT 7.3  ALBUMIN 3.6   No results for input(s): LIPASE, AMYLASE in the last 168 hours. No results for input(s): AMMONIA in the last 168 hours. Coagulation Profile: No results for input(s): INR, PROTIME in the last 168 hours. Cardiac Enzymes: No results for input(s): CKTOTAL, CKMB, CKMBINDEX, TROPONINI in the last 168 hours. BNP (last 3 results) No results for input(s): PROBNP in the last 8760 hours. HbA1C: No results for input(s): HGBA1C in the last 72 hours. CBG: No results for input(s): GLUCAP in the last 168 hours. Lipid  Profile: No results for input(s): CHOL, HDL, LDLCALC, TRIG, CHOLHDL, LDLDIRECT in the last 72 hours. Thyroid Function Tests: Recent Labs    05/03/21 0631  TSH 2.966   Anemia Panel: No results for input(s): VITAMINB12, FOLATE, FERRITIN, TIBC, IRON, RETICCTPCT in the last 72 hours. Sepsis Labs: No results for input(s): PROCALCITON, LATICACIDVEN in the last 168 hours.  Recent Results (from the past 240 hour(s))  MICROSCOPIC MESSAGE     Status: None   Collection Time: 05/02/21 10:02 AM  Result Value Ref Range Status   Note   Final    Comment: This urine was analyzed for the presence of WBC,  RBC, bacteria, casts, and other formed elements.  Only those elements seen were reported. . .   SARS CORONAVIRUS 2 (TAT 6-24 HRS) Nasopharyngeal Nasopharyngeal Swab     Status: None   Collection Time: 05/02/21  8:43 PM   Specimen: Nasopharyngeal Swab  Result Value Ref Range Status   SARS Coronavirus 2 NEGATIVE NEGATIVE Final    Comment: (NOTE) SARS-CoV-2 target nucleic acids are NOT DETECTED.  The SARS-CoV-2 RNA is generally detectable in upper and lower respiratory specimens during the acute phase of infection. Negative results do not preclude SARS-CoV-2 infection, do not rule out co-infections with other pathogens, and should not be used as the sole basis for treatment or other patient management decisions. Negative results must be combined with clinical observations, patient history, and epidemiological information. The expected result is Negative.  Fact Sheet for Patients: SugarRoll.be  Fact Sheet for Healthcare Providers: https://www.woods-mathews.com/  This test is not yet approved or cleared by the [place] FDA and  has been authorized for detection and/or diagnosis of SARS-CoV-2 by FDA under an Emergency Use Authorization (EUA). This EUA will remain  in effect (meaning this test can be used) for the duration of the COVID-19  declaration under Se ction 564(b)(1) of the Act, 21 U.S.C. section 360bbb-3(b)(1), unless the authorization is terminated or revoked sooner.  Performed at [facility], [place] [address] [place], [place] [address]       Radiology Studies: No results found.  Scheduled Meds: . busPIRone  5 mg Oral BID  . enoxaparin (LOVENOX) injection  40 mg Subcutaneous Q24H  . levothyroxine  50 mcg Oral Q0600  . metoprolol tartrate  12.5 mg Oral BID  . potassium chloride  40 mEq Oral Q4H  . terazosin  5 mg Oral QHS   Continuous Infusions:   LOS: 0 days   Time spent: 30 min   Darliss Cheney, MD [facility]  05/03/2021, 11:45 AM   How to contact the [facility] Attending or Consulting provider [place] or covering provider during after hours [place], for this patient?  1. Check the care team in [facility] and look for a) attending/consulting TR[facility] provider listed and b) the [facility] team listed. Page or secure chat [phone]. 2. Log into [email or web address] and use [place]'s universal password to access. If you do not have the password, please contact the hospital operator. 3. Locate the [facility] provider you are looking for under [facility] and page to a number that you can be directly reached. 4. If you still have difficulty reaching the provider, please page the [facility] (Director on Call) for the Hospitalists listed on amion for assistance.

## 2021-05-03 NOTE — ED Notes (Signed)
Called Bed Placement to check status of bed.

## 2021-05-04 DIAGNOSIS — N390 Urinary tract infection, site not specified: Secondary | ICD-10-CM | POA: Diagnosis not present

## 2021-05-04 LAB — BASIC METABOLIC PANEL
Anion gap: 5 (ref 5–15)
BUN: 15 mg/dL (ref 8–23)
CO2: 27 mmol/L (ref 22–32)
Calcium: 9.2 mg/dL (ref 8.9–10.3)
Chloride: 105 mmol/L (ref 98–111)
Creatinine, Ser: 0.71 mg/dL (ref 0.44–1.00)
GFR, Estimated: >60 mL/min (ref >60)
Glucose, Bld: 81 mg/dL (ref 70–99)
Potassium: 3.9 mmol/L (ref 3.5–5.1)
Sodium: 137 mmol/L (ref 135–145)

## 2021-05-04 LAB — URINE CULTURE: Culture: NO GROWTH

## 2021-05-04 MED ORDER — CEFUROXIME AXETIL 250 MG PO TABS: 250.0000 mg | ORAL_TABLET | Freq: Two times a day (BID) | ORAL | 0 refills | Status: AC

## 2021-05-04 MED ORDER — POTASSIUM CHLORIDE ER 10 MEQ PO TBCR
10.0000 meq | EXTENDED_RELEASE_TABLET | Freq: Every day | ORAL | 0 refills | Status: DC
Start: 2021-05-04 — End: 2021-06-24

## 2021-05-04 NOTE — TOC Transition Note (Signed)
Transition of Care [facility]) - CM/SW Discharge Note   Patient Details  Name: Alyssa Schmitt MRN: [id number] Date of Birth: Jan 24, 1928  [facility] of [facility] CM/SW Contact:  Casmir Auguste, Marjie Skiff, RN Phone Number: 05/04/2021, 9:37 AM   Clinical Narrative:     Spoke with daughter [name] who states that pt had been set up with [facility] for home health services prior to admission. Contacted [place] liaison to alert of dc today.

## 2021-05-04 NOTE — Discharge Instructions (Signed)
Urinary Tract Infection, Adult A urinary tract infection (UTI) is an infection of any part of the urinary tract. The urinary tract includes:  The kidneys.  The ureters.  The bladder.  The urethra. These organs make, store, and get rid of pee (urine) in the body. What are the causes? This infection is caused by germs (bacteria) in your genital area. These germs grow and cause swelling (inflammation) of your urinary tract. What increases the risk? The following factors may make you more likely to develop this condition:  Using a small, thin tube (catheter) to drain pee.  Not being able to control when you pee or poop (incontinence).  Being female. If you are female, these things can increase the risk: ? Using these methods to prevent pregnancy:  A medicine that kills sperm (spermicide).  A device that blocks sperm (diaphragm). ? Having low levels of a female hormone (estrogen). ? Being pregnant. You are more likely to develop this condition if:  You have genes that add to your risk.  You are sexually active.  You take antibiotic medicines.  You have trouble peeing because of: ? A prostate that is bigger than normal, if you are female. ? A blockage in the part of your body that drains pee from the bladder. ? A kidney stone. ? A nerve condition that affects your bladder. ? Not getting enough to drink. ? Not peeing often enough.  You have other conditions, such as: ? Diabetes. ? A weak disease-fighting system (immune system). ? Sickle cell disease. ? Gout. ? Injury of the spine. What are the signs or symptoms? Symptoms of this condition include:  Needing to pee right away.  Peeing small amounts often.  Pain or burning when peeing.  Blood in the pee.  Pee that smells bad or not like normal.  Trouble peeing.  Pee that is cloudy.  Fluid coming from the vagina, if you are female.  Pain in the belly or lower back. Other symptoms include:  Vomiting.  Not  feeling hungry.  Feeling mixed up (confused). This may be the first symptom in older adults.  Being tired and grouchy (irritable).  A fever.  Watery poop (diarrhea). How is this treated?  Taking antibiotic medicine.  Taking other medicines.  Drinking enough water. In some cases, you may need to see a specialist. Follow these instructions at home: Medicines  Take over-the-counter and prescription medicines only as told by your doctor.  If you were prescribed an antibiotic medicine, take it as told by your doctor. Do not stop taking it even if you start to feel better. General instructions  Make sure you: ? Pee until your bladder is empty. ? Do not hold pee for a long time. ? Empty your bladder after sex. ? Wipe from front to back after peeing or pooping if you are a female. Use each tissue one time when you wipe.  Drink enough fluid to keep your pee pale yellow.  Keep all follow-up visits.   Contact a doctor if:  You do not get better after 1-2 days.  Your symptoms go away and then come back. Get help right away if:  You have very bad back pain.  You have very bad pain in your lower belly.  You have a fever.  You have chills.  You feeling like you will vomit or you vomit. Summary  A urinary tract infection (UTI) is an infection of any part of the urinary tract.  This condition is caused by   germs in your genital area.  There are many risk factors for a UTI.  Treatment includes antibiotic medicines.  Drink enough fluid to keep your pee pale yellow. This information is not intended to replace advice given to you by your health care provider. Make sure you discuss any questions you have with your health care provider. Document Revised: 07/05/2020 Document Reviewed: 07/05/2020 Elsevier Patient Education  2021 [employer]vier Inc.  

## 2021-05-04 NOTE — Plan of Care (Signed)
  Problem: Education: Goal: Knowledge of General Education information will improve Description: Including pain rating scale, medication(s)/side effects and non-pharmacologic comfort measures Outcome: Progressing   Problem: Clinical Measurements: Goal: Diagnostic test results will improve Outcome: Progressing Goal: Cardiovascular complication will be avoided Outcome: Progressing   Problem: Coping: Goal: Level of anxiety will decrease Outcome: Progressing   Problem: Pain Managment: Goal: General experience of comfort will improve Outcome: Progressing

## 2021-05-04 NOTE — Discharge Summary (Addendum)
Physician Discharge Summary  Alyssa Schmitt LEX:[id number] DOB: [birth year] DOA: 05/02/2021  PCP: Unk Pinto, MD  Admit date: 05/02/2021 Discharge date: 05/04/2021 30 Day Unplanned Readmission Risk Score   Flowsheet Row Admission (Discharged) from 04/11/2021 in [facility]  30 Day Unplanned Readmission Risk Score (%) 11.63 Filed at 04/16/2021 2000     This score is the patient's risk of an unplanned readmission within 30 days of being discharged (0 -100%). The score is based on dignosis, age, lab data, medications, orders, and past utilization.   Low:  0-14.9   Medium: 15-21.9   High: 22-29.9   Extreme: 30 and above         Admitted From: Home Disposition: Home  Recommendations for Outpatient Follow-up:  1. Follow up with PCP in 1-2 weeks 2. Please call GYN at [phone] to make an appointment if you do not hear from them by end of this week. 3. Please obtain BMP/CBC in one week 4. Please follow up with your PCP on the following pending results: Unresulted Labs (From admission, onward)          Start     Ordered   05/04/21 8466  Basic metabolic panel  Daily,   R      05/03/21 0849   05/03/21 0500  Hemoglobin A1c  Tomorrow morning,   R        05/02/21 2251            Home Health: Yes Equipment/Devices: None  Discharge Condition: Stable CODE STATUS: DNR Diet recommendation: Cardiac  Subjective: Seen and examined.  She has no complaints.  She feels back to baseline and wants to go home.  Brief/Interim Summary: Alyssa Schmitt a 87 y.o.femalewith medical history significant ofchronic diastolic heart failure, hypertension, hypothyroidism, COPD, obesity (BMI 34.44), GERD, prediabetes presented to the ED via EMS complaining of dysuria and generalized weakness. In the ED, vital signs stable. Severe pelvic organ prolapse with a tender and excoriated uterus noted on pelvic exam. Labs showing WBC 7.8, UA showing positive nitrite,  negative leukocytes, 0-5 WBCs, and rare bacteria. SARS-CoV-2 PCR negative.  Patient was given gentamicin (based on allergy profile and previous urine culture results).  She was admitted to hospitalist service.  Further information was gathered from patient's daughter about patient's allergies to penicillins and it was known that patient has history of only itch and rash and no anaphylaxis so patient was switched to cefepime and she has tolerated 3-4 doses so far.  Patient was evaluated by PT OT and they recommended home health PT.  Patient remained hemodynamically stable otherwise.  She is being discharged home today in stable condition.  Her urine culture has remained negative thus far.  Due to the fact that she tolerated cefepime here, I am discharging her on Ceftin for 3 more days.  Plan of care and discharge discussed with patient as well as her daughter over the phone.  She also came in with mild AKI which resolved with IV fluids.  Severe pelvic organ prolapse: Called to consult on-call gynecologist and was able to speak to Dr. Elly Modena who recommended patient to follow-up as outpatient.  She told me that most likely her office will contact patient and set up an appointment within 2 weeks but also provided the phone #[phone] for the patient to call if she does not hear from them.  I have relayed this information to patient's daughter as well.  Discharge Diagnoses:  Principal Problem:  UTI (urinary tract infection) Active Problems:   Hypothyroidism   AKI (acute kidney injury) ([place])   Prolapse of female pelvic organs   Generalized weakness    Discharge Instructions   Allergies as of 05/04/2021      Reactions   Aspirin Swelling   Celebrex [celecoxib] Other (See Comments)   "my body lit up"   Fosamax [alendronate Sodium] Other (See Comments)   unknown   Keflex [cephalexin] Other (See Comments)   unknown   Penicillins Other (See Comments)   unknown   Prednisone Itching   Prevacid  [lansoprazole] Other (See Comments)   unknown   Sulfa Antibiotics Other (See Comments)   unknown   Tetanus Toxoids Other (See Comments)   unknown   Tetracyclines & Related Nausea Only         Medication List    STOP taking these medications   ertapenem 1 g injection Commonly known as: INVANZ   potassium chloride SA 20 MEQ tablet Commonly known as: KLOR-CON   senna-docusate 8.6-50 MG tablet Commonly known as: Senokot-S     TAKE these medications   acetaminophen 325 MG tablet Commonly known as: TYLENOL Take 2 tablets (650 mg total) by mouth every 6 (six) hours as needed (prn TEMP>38.1 Celsius).   busPIRone 5 MG tablet Commonly known as: BUSPAR Take 5 mg by mouth 2 (two) times daily.   calcium-vitamin D 250-125 MG-UNIT tablet Commonly known as: OSCAL WITH D Take 1 tablet by mouth daily.   cefUROXime 250 MG tablet Commonly known as: CEFTIN Take 1 tablet (250 mg total) by mouth 2 (two) times daily with a meal for 3 days.   D-Mannose 500 MG Caps Take 1 tablet by mouth 2 (two) times daily.   fexofenadine 180 MG tablet Commonly known as: ALLEGRA Take 180 mg by mouth daily.   furosemide 20 MG tablet Commonly known as: LASIX Take 1 tablet (20 mg total) by mouth daily.   levothyroxine 50 MCG tablet Commonly known as: SYNTHROID TAKE 1 TABLET DAILY ON AN EMPTY STOMACH WITH ONLY WATER FOR 30 MINUTES & NO ANTACID MEDS, CALCIUM OR MAGNESIUM FOR 4 HOURS & AVOID BIOTIN What changed: See the new instructions.   magnesium oxide 400 (241.3 Mg) MG tablet Commonly known as: MAG-OX Take 1 tablet (400 mg total) by mouth 2 (two) times daily. What changed: when to take this   metoprolol tartrate 25 MG tablet Commonly known as: LOPRESSOR Take 0.5 tablets (12.5 mg total) by mouth 2 (two) times daily.   multivitamin with minerals Tabs tablet Take 1 tablet by mouth daily.   nystatin powder Commonly known as: MYCOSTATIN/NYSTOP Apply daily after a shower as needed What changed:    how much to take  how to take this  when to take this  additional instructions   omeprazole 20 MG capsule Commonly known as: PRILOSEC Take 20 mg by mouth daily. What changed: Another medication with the same name was removed. Continue taking this medication, and follow the directions you see here.   potassium chloride 10 MEQ tablet Commonly known as: KLOR-CON Take 1 tablet (10 mEq total) by mouth daily.   PROBIOTIC FORMULA PO Take 1 tablet by mouth daily.   terazosin 5 MG capsule Commonly known as: HYTRIN Take 5 mg by mouth at bedtime. What changed: Another medication with the same name was removed. Continue taking this medication, and follow the directions you see here.   Vitamin D3 50 MCG (2000 UT) Tabs Take 3 tablets by mouth daily.  zinc gluconate 50 MG tablet Take 50 mg by mouth daily.       Follow-up Information    [facility] [place] Follow up.   Specialty: [facility] Contact information: [place] [address] [phone]        Unk Pinto, MD Follow up in 1 week(s).   Specialty: Internal Medicine Contact information: [address] [place] [address] [phone]        Constant, Peggy, MD Follow up in 1 week(s).   Specialty: Obstetrics and Gynecology Contact information: [address] [place] [address] [phone]              Allergies  Allergen Reactions  . Aspirin Swelling  . Celebrex [Celecoxib] Other (See Comments)    "my body lit up"  . Fosamax [Alendronate Sodium] Other (See Comments)    unknown  . Keflex [Cephalexin] Other (See Comments)    unknown  . Penicillins Other (See Comments)    unknown  . Prednisone Itching  . Prevacid [Lansoprazole] Other (See Comments)    unknown  . Sulfa Antibiotics Other (See Comments)    unknown  . Tetanus Toxoids Other (See Comments)    unknown  . Tetracyclines & Related Nausea Only          Consultations: OB/GYN   Procedures/Studies: DG CHEST PORT 1 VIEW  Result Date: 04/13/2021 CLINICAL DATA:  Hypoxemia. EXAM: PORTABLE CHEST 1 VIEW COMPARISON:  One-view chest x-ray 04/11/2021. FINDINGS: Heart is enlarged. Lung volumes are low. Elevated left hemidiaphragm with gastric bubble noted. Increasing pulmonary vascular congestion and edema present. IMPRESSION: 1. Cardiomegaly with increasing pulmonary vascular congestion and edema compatible with congestive heart failure. 2. Low lung volumes. Electronically Signed   By: San Morelle M.D.   On: 04/13/2021 11:01   DG CHEST PORT 1 VIEW  Result Date: 04/11/2021 CLINICAL DATA:  Acute respiratory failure with hypoxia EXAM: PORTABLE CHEST 1 VIEW COMPARISON:  06/22/2018 FINDINGS: Cardiomegaly. Large hiatal hernia was also seen in 2019. Low volume chest. Hazy density behind the heart and streaky density at the right base. Trace pleural fluid is possible. No pneumothorax. IMPRESSION: 1. Limited low volume chest. 2. Retrocardiac opacification that could be atelectasis or infection. 3. Large hiatal hernia. Electronically Signed   By: Monte Fantasia M.D.   On: 04/11/2021 04:11     Discharge Exam: Vitals:   05/04/21 0509 05/04/21 0917  BP: (!) 122/55 132/61  Pulse: 71 71  Resp: 16   Temp: 98.2 F (36.8 C)   SpO2: 90%    Vitals:   05/03/21 1236 05/03/21 2026 05/04/21 0509 05/04/21 0917  BP: 129/71 125/61 (!) 122/55 132/61  Pulse: 75 76 71 71  Resp: 20 20 16    Temp: (!) 97.4 F (36.3 C) 98.6 F (37 C) 98.2 F (36.8 C)   TempSrc: Oral Oral Oral   SpO2: 94% 93% 90%   Weight:      Height:        General: Pt is alert, awake, not in acute distress Cardiovascular: RRR, S1/S2 +, no rubs, no gallops Respiratory: CTA bilaterally, no wheezing, no rhonchi Abdominal: Soft, NT, ND, bowel sounds + Extremities: no edema, no cyanosis    The results of significant diagnostics from this hospitalization (including imaging,  microbiology, ancillary and laboratory) are listed below for reference.     Microbiology: Recent Results (from the past 240 hour(s))  Urine Culture     Status: None   Collection Time: 05/02/21  10:02 AM   Specimen: Urine  Result Value Ref Range Status   MICRO NUMBER: [id number]  Final   SPECIMEN QUALITY: Adequate  Final   Sample Source URINE  Final   STATUS: FINAL  Final   Result: No Growth  Final  MICROSCOPIC MESSAGE     Status: None   Collection Time: 05/02/21 10:02 AM  Result Value Ref Range Status   Note   Final    Comment: This urine was analyzed for the presence of WBC,  RBC, bacteria, casts, and other formed elements.  Only those elements seen were reported. . .   Urine culture     Status: None   Collection Time: 05/02/21  6:10 PM   Specimen: Urine, Catheterized  Result Value Ref Range Status   Specimen Description   Final    URINE, CATHETERIZED Performed at [facility], [place] [address], [place], [place] [address]    Special Requests   Final    NONE Performed at [facility], [place] [address], [place], [place] [address]    Culture   Final    NO GROWTH Performed at [facility], [place] [address], [place], [place] [address]    Report Status 05/04/2021 FINAL  Final  SARS CORONAVIRUS 2 (TAT 6-24 HRS) Nasopharyngeal Nasopharyngeal Swab     Status: None   Collection Time: 05/02/21  8:43 PM   Specimen: Nasopharyngeal Swab  Result Value Ref Range Status   SARS Coronavirus 2 NEGATIVE NEGATIVE Final    Comment: (NOTE) SARS-CoV-2 target nucleic acids are NOT DETECTED.  The SARS-CoV-2 RNA is generally detectable in upper and lower respiratory specimens during the acute phase of infection. Negative results do not preclude SARS-CoV-2 infection, do not rule out co-infections with other pathogens, and should not be used as the sole basis for treatment or other patient management decisions. Negative results must be combined with  clinical observations, patient history, and epidemiological information. The expected result is Negative.  Fact Sheet for Patients: SugarRoll.be  Fact Sheet for Healthcare Providers: https://www.woods-mathews.com/  This test is not yet approved or cleared by the [place] FDA and  has been authorized for detection and/or diagnosis of SARS-CoV-2 by FDA under an Emergency Use Authorization (EUA). This EUA will remain  in effect (meaning this test can be used) for the duration of the COVID-19 declaration under Se ction 564(b)(1) of the Act, 21 U.S.C. section 360bbb-3(b)(1), unless the authorization is terminated or revoked sooner.  Performed at [facility], [place] [address] [place], [place] [address]   MRSA PCR Screening     Status: Abnormal   Collection Time: 05/03/21  3:32 PM   Specimen: Nasal Mucosa; Nasopharyngeal  Result Value Ref Range Status   MRSA by PCR POSITIVE (A) NEGATIVE Final    Comment:        The GeneXpert MRSA Assay (FDA approved for NASAL specimens only), is one component of a comprehensive MRSA colonization surveillance program. It is not intended to diagnose MRSA infection nor to guide or monitor treatment for MRSA infections. RESULT CALLED TO, READ BACK BY AND VERIFIED WITH: Lynnell Chad RN AT 2331 ON 05/03/21 BY Buel Ream Performed at [facility], [place] [address], [place], [place] [address]      Labs: BNP (last 3 results) No results for input(s): BNP in the last 8760 hours. Basic Metabolic Panel: Recent Labs  Lab 05/02/21 1804 05/03/21 0631 05/04/21 0451  NA 136 135 137  K 3.7  3.0* 3.9  CL 98 101 105  CO2 26 25 27   GLUCOSE 118* 84 81  BUN 22 17 15   CREATININE 1.01* 0.55 0.71  CALCIUM 10.0 9.2 9.2  MG 2.0  --   --    Liver Function Tests: Recent Labs  Lab 05/02/21 1804  AST 32  ALT 20  ALKPHOS 55  BILITOT 0.7  PROT 7.3  ALBUMIN 3.6   No results for input(s):  LIPASE, AMYLASE in the last 168 hours. No results for input(s): AMMONIA in the last 168 hours. CBC: Recent Labs  Lab 05/02/21 1804  WBC 7.8  NEUTROABS 5.4  HGB 14.5  HCT 44.3  MCV 91.0  PLT 173   Cardiac Enzymes: No results for input(s): CKTOTAL, CKMB, CKMBINDEX, TROPONINI in the last 168 hours. BNP: Invalid input(s): POCBNP CBG: No results for input(s): GLUCAP in the last 168 hours. D-Dimer No results for input(s): DDIMER in the last 72 hours. Hgb A1c No results for input(s): HGBA1C in the last 72 hours. Lipid Profile No results for input(s): CHOL, HDL, LDLCALC, TRIG, CHOLHDL, LDLDIRECT in the last 72 hours. Thyroid function studies Recent Labs    05/03/21 0631  TSH 2.966   Anemia work up No results for input(s): VITAMINB12, FOLATE, FERRITIN, TIBC, IRON, RETICCTPCT in the last 72 hours. Urinalysis    Component Value Date/Time   COLORURINE AMBER (A) 05/02/2021 1810   APPEARANCEUR CLEAR 05/02/2021 1810   LABSPEC 1.008 05/02/2021 1810   PHURINE 6.0 05/02/2021 1810   GLUCOSEU NEGATIVE 05/02/2021 1810   HGBUR SMALL (A) 05/02/2021 1810   BILIRUBINUR NEGATIVE 05/02/2021 1810   KETONESUR NEGATIVE 05/02/2021 1810   PROTEINUR NEGATIVE 05/02/2021 1810   UROBILINOGEN 0.2 12/19/2014 1357   NITRITE POSITIVE (A) 05/02/2021 1810   LEUKOCYTESUR NEGATIVE 05/02/2021 1810   Sepsis Labs Invalid input(s): PROCALCITONIN,  WBC,  LACTICIDVEN Microbiology Recent Results (from the past 240 hour(s))  Urine Culture     Status: None   Collection Time: 05/02/21 10:02 AM   Specimen: Urine  Result Value Ref Range Status   MICRO NUMBER: [id number]  Final   SPECIMEN QUALITY: Adequate  Final   Sample Source URINE  Final   STATUS: FINAL  Final   Result: No Growth  Final  MICROSCOPIC MESSAGE     Status: None   Collection Time: 05/02/21 10:02 AM  Result Value Ref Range Status   Note   Final    Comment: This urine was analyzed for the presence of WBC,  RBC, bacteria, casts, and other formed  elements.  Only those elements seen were reported. . .   Urine culture     Status: None   Collection Time: 05/02/21  6:10 PM   Specimen: Urine, Catheterized  Result Value Ref Range Status   Specimen Description   Final    URINE, CATHETERIZED Performed at [facility], [place] [address], [place], [place] [address]    Special Requests   Final    NONE Performed at [facility], [place] [address] [place], [place] [address]    Culture   Final    NO GROWTH Performed at [facility], [place] [address], [place], [place] [address]    Report Status 05/04/2021 FINAL  Final  SARS CORONAVIRUS 2 (TAT 6-24 HRS) Nasopharyngeal Nasopharyngeal Swab     Status: None   Collection Time: 05/02/21  8:43 PM   Specimen: Nasopharyngeal Swab  Result Value Ref Range Status   SARS Coronavirus 2 NEGATIVE NEGATIVE Final  Comment: (NOTE) SARS-CoV-2 target nucleic acids are NOT DETECTED.  The SARS-CoV-2 RNA is generally detectable in upper and lower respiratory specimens during the acute phase of infection. Negative results do not preclude SARS-CoV-2 infection, do not rule out co-infections with other pathogens, and should not be used as the sole basis for treatment or other patient management decisions. Negative results must be combined with clinical observations, patient history, and epidemiological information. The expected result is Negative.  Fact Sheet for Patients: SugarRoll.be  Fact Sheet for Healthcare Providers: https://www.woods-mathews.com/  This test is not yet approved or cleared by the [place] FDA and  has been authorized for detection and/or diagnosis of SARS-CoV-2 by FDA under an Emergency Use Authorization (EUA). This EUA will remain  in effect (meaning this test can be used) for the duration of the COVID-19 declaration under Se ction 564(b)(1) of the Act, 21 U.S.C. section 360bbb-3(b)(1), unless  the authorization is terminated or revoked sooner.  Performed at [facility], [place] [address], [place], [place] [address]   MRSA PCR Screening     Status: Abnormal   Collection Time: 05/03/21  3:32 PM   Specimen: Nasal Mucosa; Nasopharyngeal  Result Value Ref Range Status   MRSA by PCR POSITIVE (A) NEGATIVE Final    Comment:        The GeneXpert MRSA Assay (FDA approved for NASAL specimens only), is one component of a comprehensive MRSA colonization surveillance program. It is not intended to diagnose MRSA infection nor to guide or monitor treatment for MRSA infections. RESULT CALLED TO, READ BACK BY AND VERIFIED WITH: Lynnell Chad RN AT 2331 ON 05/03/21 BY Buel Ream Performed at [facility], [place] [address] [place], [place] [address]      Time coordinating discharge: Over 30 minutes  SIGNED:   Darliss Cheney, MD  [facility] 05/04/2021, 12:59 PM  If 7PM-7AM, please contact night-coverage [email or web address]

## 2021-05-04 NOTE — Progress Notes (Signed)
Patient discharged home with daughter, discharge instructions given and explained to patient/daughter, they verbalized understanding, denies any pain/distress, patient in no pain/distress. No pressure wound noted. Accompanied home by daughter.

## 2021-05-06 ENCOUNTER — Ambulatory Visit: Payer: Medicare Other | Admitting: Podiatry

## 2021-05-06 DIAGNOSIS — G4700 Insomnia, unspecified: Secondary | ICD-10-CM | POA: Diagnosis not present

## 2021-05-06 DIAGNOSIS — I959 Hypotension, unspecified: Secondary | ICD-10-CM | POA: Diagnosis not present

## 2021-05-06 DIAGNOSIS — Z993 Dependence on wheelchair: Secondary | ICD-10-CM | POA: Diagnosis not present

## 2021-05-06 DIAGNOSIS — I5033 Acute on chronic diastolic (congestive) heart failure: Secondary | ICD-10-CM | POA: Diagnosis not present

## 2021-05-06 DIAGNOSIS — I110 Hypertensive heart disease with heart failure: Secondary | ICD-10-CM | POA: Diagnosis not present

## 2021-05-06 DIAGNOSIS — I350 Nonrheumatic aortic (valve) stenosis: Secondary | ICD-10-CM | POA: Diagnosis not present

## 2021-05-06 DIAGNOSIS — N390 Urinary tract infection, site not specified: Secondary | ICD-10-CM | POA: Diagnosis not present

## 2021-05-06 DIAGNOSIS — M810 Age-related osteoporosis without current pathological fracture: Secondary | ICD-10-CM | POA: Diagnosis not present

## 2021-05-06 DIAGNOSIS — I872 Venous insufficiency (chronic) (peripheral): Secondary | ICD-10-CM | POA: Diagnosis not present

## 2021-05-06 DIAGNOSIS — N8110 Cystocele, unspecified: Secondary | ICD-10-CM | POA: Diagnosis not present

## 2021-05-06 DIAGNOSIS — J449 Chronic obstructive pulmonary disease, unspecified: Secondary | ICD-10-CM | POA: Diagnosis not present

## 2021-05-06 DIAGNOSIS — Z9181 History of falling: Secondary | ICD-10-CM | POA: Diagnosis not present

## 2021-05-06 DIAGNOSIS — K219 Gastro-esophageal reflux disease without esophagitis: Secondary | ICD-10-CM | POA: Diagnosis not present

## 2021-05-06 DIAGNOSIS — E039 Hypothyroidism, unspecified: Secondary | ICD-10-CM | POA: Diagnosis not present

## 2021-05-06 LAB — HEMOGLOBIN A1C
Hgb A1c MFr Bld: 5.4 % (ref 4.8–5.6)
Mean Plasma Glucose: 108 mg/dL

## 2021-05-07 ENCOUNTER — Telehealth: Payer: Self-pay

## 2021-05-07 DIAGNOSIS — J449 Chronic obstructive pulmonary disease, unspecified: Secondary | ICD-10-CM | POA: Diagnosis not present

## 2021-05-07 DIAGNOSIS — I959 Hypotension, unspecified: Secondary | ICD-10-CM | POA: Diagnosis not present

## 2021-05-07 DIAGNOSIS — M810 Age-related osteoporosis without current pathological fracture: Secondary | ICD-10-CM | POA: Diagnosis not present

## 2021-05-07 DIAGNOSIS — E039 Hypothyroidism, unspecified: Secondary | ICD-10-CM | POA: Diagnosis not present

## 2021-05-07 DIAGNOSIS — N390 Urinary tract infection, site not specified: Secondary | ICD-10-CM | POA: Diagnosis not present

## 2021-05-07 DIAGNOSIS — I5033 Acute on chronic diastolic (congestive) heart failure: Secondary | ICD-10-CM | POA: Diagnosis not present

## 2021-05-07 DIAGNOSIS — I110 Hypertensive heart disease with heart failure: Secondary | ICD-10-CM | POA: Diagnosis not present

## 2021-05-07 DIAGNOSIS — N8110 Cystocele, unspecified: Secondary | ICD-10-CM | POA: Diagnosis not present

## 2021-05-07 DIAGNOSIS — Z9181 History of falling: Secondary | ICD-10-CM | POA: Diagnosis not present

## 2021-05-07 DIAGNOSIS — I350 Nonrheumatic aortic (valve) stenosis: Secondary | ICD-10-CM | POA: Diagnosis not present

## 2021-05-07 DIAGNOSIS — I872 Venous insufficiency (chronic) (peripheral): Secondary | ICD-10-CM | POA: Diagnosis not present

## 2021-05-07 DIAGNOSIS — Z993 Dependence on wheelchair: Secondary | ICD-10-CM | POA: Diagnosis not present

## 2021-05-07 DIAGNOSIS — K219 Gastro-esophageal reflux disease without esophagitis: Secondary | ICD-10-CM | POA: Diagnosis not present

## 2021-05-07 DIAGNOSIS — G4700 Insomnia, unspecified: Secondary | ICD-10-CM | POA: Diagnosis not present

## 2021-05-07 NOTE — Telephone Encounter (Signed)
Called patient on 05/07/2021 , 3:03 PM in an attempt to reach the patient for a hospital follow up. I spoke with her daughter [name].   Admit date: 05/02/21 Discharge: 05/04/21   She does not have any questions or concerns about medications from the hospital admission. Concerned though about her mother not being ambulatory. The patient's medications were reviewed over the phone, they were counseled to bring in all current medications to the hospital follow up visit.   I advised the patient to call if any questions or concerns arise about the hospital admission or medications    Home health was started in the hospital.  All questions were answered and a follow up appointment was made.   Prior to Admission medications   Medication Sig Start Date End Date Taking? Authorizing Provider  acetaminophen (TYLENOL) 325 MG tablet Take 2 tablets (650 mg total) by mouth every 6 (six) hours as needed (prn TEMP>38.1 Celsius). 12/21/14  Yes Reyne Dumas, MD  busPIRone (BUSPAR) 5 MG tablet Take 5 mg by mouth 2 (two) times daily. 04/17/21  Yes [provider]  calcium-vitamin D (OSCAL WITH D) 250-125 MG-UNIT per tablet Take 1 tablet by mouth daily.   Yes [provider]  cefUROXime (CEFTIN) 250 MG tablet Take 1 tablet (250 mg total) by mouth 2 (two) times daily with a meal for 3 days. 05/04/21 05/07/21 Yes Pahwani, Einar Grad, MD  Cholecalciferol (VITAMIN D3) 2000 UNITS TABS Take 3 tablets by mouth daily.   Yes [provider]  D-Mannose 500 MG CAPS Take 1 tablet by mouth 2 (two) times daily.   Yes [provider]  fexofenadine (ALLEGRA) 180 MG tablet Take 180 mg by mouth daily.    Yes [provider]  furosemide (LASIX) 20 MG tablet Take 1 tablet (20 mg total) by mouth daily. 04/15/21  Yes Regalado, Belkys A, MD  levothyroxine (SYNTHROID) 50 MCG tablet TAKE 1 TABLET DAILY ON AN EMPTY STOMACH WITH ONLY WATER FOR 30 MINUTES & NO ANTACID MEDS, CALCIUM OR MAGNESIUM FOR 4 HOURS & AVOID  BIOTIN Patient taking differently: Take 50 mcg by mouth daily before breakfast. 02/24/21  Yes McClanahan, Kyra, NP  magnesium oxide (MAG-OX) 400 (241.3 MG) MG tablet Take 1 tablet (400 mg total) by mouth 2 (two) times daily. Patient taking differently: Take 400 mg by mouth daily. 12/21/14  Yes Reyne Dumas, MD  metoprolol tartrate (LOPRESSOR) 25 MG tablet Take 0.5 tablets (12.5 mg total) by mouth 2 (two) times daily. 04/15/21  Yes Regalado, Belkys A, MD  Multiple Vitamin (MULITIVITAMIN WITH MINERALS) TABS Take 1 tablet by mouth daily.   Yes [provider]  nystatin (MYCOSTATIN/NYSTOP) powder Apply daily after a shower as needed Patient taking differently: Apply 1 application topically daily. Apply daily after a shower . 01/13/21  Yes [name], NP  omeprazole (PRILOSEC) 40 MG capsule Take by mouth daily. 04/16/21  Yes [provider]  potassium chloride (KLOR-CON) 10 MEQ tablet Take 1 tablet (10 mEq total) by mouth daily. 05/04/21  Yes Darliss Cheney, MD  Probiotic Product (PROBIOTIC FORMULA PO) Take 1 tablet by mouth daily.    Yes [provider]  terazosin (HYTRIN) 5 MG capsule Take 5 mg by mouth at bedtime.   Yes [provider]  zinc gluconate 50 MG tablet Take 50 mg by mouth daily.   Yes [provider]

## 2021-05-09 DIAGNOSIS — I5033 Acute on chronic diastolic (congestive) heart failure: Secondary | ICD-10-CM | POA: Diagnosis not present

## 2021-05-09 DIAGNOSIS — M810 Age-related osteoporosis without current pathological fracture: Secondary | ICD-10-CM | POA: Diagnosis not present

## 2021-05-09 DIAGNOSIS — I959 Hypotension, unspecified: Secondary | ICD-10-CM | POA: Diagnosis not present

## 2021-05-09 DIAGNOSIS — N8110 Cystocele, unspecified: Secondary | ICD-10-CM | POA: Diagnosis not present

## 2021-05-09 DIAGNOSIS — K219 Gastro-esophageal reflux disease without esophagitis: Secondary | ICD-10-CM | POA: Diagnosis not present

## 2021-05-09 DIAGNOSIS — Z993 Dependence on wheelchair: Secondary | ICD-10-CM | POA: Diagnosis not present

## 2021-05-09 DIAGNOSIS — J449 Chronic obstructive pulmonary disease, unspecified: Secondary | ICD-10-CM | POA: Diagnosis not present

## 2021-05-09 DIAGNOSIS — I872 Venous insufficiency (chronic) (peripheral): Secondary | ICD-10-CM | POA: Diagnosis not present

## 2021-05-09 DIAGNOSIS — N390 Urinary tract infection, site not specified: Secondary | ICD-10-CM | POA: Diagnosis not present

## 2021-05-09 DIAGNOSIS — E039 Hypothyroidism, unspecified: Secondary | ICD-10-CM | POA: Diagnosis not present

## 2021-05-09 DIAGNOSIS — G4700 Insomnia, unspecified: Secondary | ICD-10-CM | POA: Diagnosis not present

## 2021-05-09 DIAGNOSIS — I350 Nonrheumatic aortic (valve) stenosis: Secondary | ICD-10-CM | POA: Diagnosis not present

## 2021-05-09 DIAGNOSIS — Z9181 History of falling: Secondary | ICD-10-CM | POA: Diagnosis not present

## 2021-05-09 DIAGNOSIS — I110 Hypertensive heart disease with heart failure: Secondary | ICD-10-CM | POA: Diagnosis not present

## 2021-05-12 DIAGNOSIS — G4700 Insomnia, unspecified: Secondary | ICD-10-CM | POA: Diagnosis not present

## 2021-05-12 DIAGNOSIS — N8110 Cystocele, unspecified: Secondary | ICD-10-CM | POA: Diagnosis not present

## 2021-05-12 DIAGNOSIS — I959 Hypotension, unspecified: Secondary | ICD-10-CM | POA: Diagnosis not present

## 2021-05-12 DIAGNOSIS — I872 Venous insufficiency (chronic) (peripheral): Secondary | ICD-10-CM | POA: Diagnosis not present

## 2021-05-12 DIAGNOSIS — Z9181 History of falling: Secondary | ICD-10-CM | POA: Diagnosis not present

## 2021-05-12 DIAGNOSIS — J449 Chronic obstructive pulmonary disease, unspecified: Secondary | ICD-10-CM | POA: Diagnosis not present

## 2021-05-12 DIAGNOSIS — N390 Urinary tract infection, site not specified: Secondary | ICD-10-CM | POA: Diagnosis not present

## 2021-05-12 DIAGNOSIS — I5033 Acute on chronic diastolic (congestive) heart failure: Secondary | ICD-10-CM | POA: Diagnosis not present

## 2021-05-12 DIAGNOSIS — M810 Age-related osteoporosis without current pathological fracture: Secondary | ICD-10-CM | POA: Diagnosis not present

## 2021-05-12 DIAGNOSIS — K219 Gastro-esophageal reflux disease without esophagitis: Secondary | ICD-10-CM | POA: Diagnosis not present

## 2021-05-12 DIAGNOSIS — Z993 Dependence on wheelchair: Secondary | ICD-10-CM | POA: Diagnosis not present

## 2021-05-12 DIAGNOSIS — I110 Hypertensive heart disease with heart failure: Secondary | ICD-10-CM | POA: Diagnosis not present

## 2021-05-12 DIAGNOSIS — E039 Hypothyroidism, unspecified: Secondary | ICD-10-CM | POA: Diagnosis not present

## 2021-05-12 DIAGNOSIS — I350 Nonrheumatic aortic (valve) stenosis: Secondary | ICD-10-CM | POA: Diagnosis not present

## 2021-05-15 ENCOUNTER — Other Ambulatory Visit: Payer: Self-pay | Admitting: Internal Medicine

## 2021-05-16 ENCOUNTER — Inpatient Hospital Stay: Payer: Medicare Other | Admitting: Internal Medicine

## 2021-05-16 ENCOUNTER — Other Ambulatory Visit: Payer: Self-pay | Admitting: Adult Health

## 2021-05-16 DIAGNOSIS — I959 Hypotension, unspecified: Secondary | ICD-10-CM | POA: Diagnosis not present

## 2021-05-16 DIAGNOSIS — N8110 Cystocele, unspecified: Secondary | ICD-10-CM | POA: Diagnosis not present

## 2021-05-16 DIAGNOSIS — I872 Venous insufficiency (chronic) (peripheral): Secondary | ICD-10-CM | POA: Diagnosis not present

## 2021-05-16 DIAGNOSIS — J449 Chronic obstructive pulmonary disease, unspecified: Secondary | ICD-10-CM | POA: Diagnosis not present

## 2021-05-16 DIAGNOSIS — E039 Hypothyroidism, unspecified: Secondary | ICD-10-CM | POA: Diagnosis not present

## 2021-05-16 DIAGNOSIS — Z9181 History of falling: Secondary | ICD-10-CM | POA: Diagnosis not present

## 2021-05-16 DIAGNOSIS — Z993 Dependence on wheelchair: Secondary | ICD-10-CM | POA: Diagnosis not present

## 2021-05-16 DIAGNOSIS — G4700 Insomnia, unspecified: Secondary | ICD-10-CM | POA: Diagnosis not present

## 2021-05-16 DIAGNOSIS — I5033 Acute on chronic diastolic (congestive) heart failure: Secondary | ICD-10-CM | POA: Diagnosis not present

## 2021-05-16 DIAGNOSIS — K219 Gastro-esophageal reflux disease without esophagitis: Secondary | ICD-10-CM | POA: Diagnosis not present

## 2021-05-16 DIAGNOSIS — I350 Nonrheumatic aortic (valve) stenosis: Secondary | ICD-10-CM | POA: Diagnosis not present

## 2021-05-16 DIAGNOSIS — M810 Age-related osteoporosis without current pathological fracture: Secondary | ICD-10-CM | POA: Diagnosis not present

## 2021-05-16 DIAGNOSIS — N390 Urinary tract infection, site not specified: Secondary | ICD-10-CM | POA: Diagnosis not present

## 2021-05-16 DIAGNOSIS — I110 Hypertensive heart disease with heart failure: Secondary | ICD-10-CM | POA: Diagnosis not present

## 2021-05-19 DIAGNOSIS — N390 Urinary tract infection, site not specified: Secondary | ICD-10-CM | POA: Diagnosis not present

## 2021-05-19 DIAGNOSIS — Z993 Dependence on wheelchair: Secondary | ICD-10-CM | POA: Diagnosis not present

## 2021-05-19 DIAGNOSIS — I5033 Acute on chronic diastolic (congestive) heart failure: Secondary | ICD-10-CM | POA: Diagnosis not present

## 2021-05-19 DIAGNOSIS — I110 Hypertensive heart disease with heart failure: Secondary | ICD-10-CM | POA: Diagnosis not present

## 2021-05-19 DIAGNOSIS — E039 Hypothyroidism, unspecified: Secondary | ICD-10-CM | POA: Diagnosis not present

## 2021-05-19 DIAGNOSIS — I959 Hypotension, unspecified: Secondary | ICD-10-CM | POA: Diagnosis not present

## 2021-05-19 DIAGNOSIS — N8110 Cystocele, unspecified: Secondary | ICD-10-CM | POA: Diagnosis not present

## 2021-05-19 DIAGNOSIS — I872 Venous insufficiency (chronic) (peripheral): Secondary | ICD-10-CM | POA: Diagnosis not present

## 2021-05-19 DIAGNOSIS — I350 Nonrheumatic aortic (valve) stenosis: Secondary | ICD-10-CM | POA: Diagnosis not present

## 2021-05-19 DIAGNOSIS — J449 Chronic obstructive pulmonary disease, unspecified: Secondary | ICD-10-CM | POA: Diagnosis not present

## 2021-05-19 DIAGNOSIS — Z9181 History of falling: Secondary | ICD-10-CM | POA: Diagnosis not present

## 2021-05-19 DIAGNOSIS — K219 Gastro-esophageal reflux disease without esophagitis: Secondary | ICD-10-CM | POA: Diagnosis not present

## 2021-05-19 DIAGNOSIS — G4700 Insomnia, unspecified: Secondary | ICD-10-CM | POA: Diagnosis not present

## 2021-05-19 DIAGNOSIS — M810 Age-related osteoporosis without current pathological fracture: Secondary | ICD-10-CM | POA: Diagnosis not present

## 2021-05-22 ENCOUNTER — Ambulatory Visit: Payer: Medicare Other | Admitting: Obstetrics and Gynecology

## 2021-05-22 DIAGNOSIS — E039 Hypothyroidism, unspecified: Secondary | ICD-10-CM | POA: Diagnosis not present

## 2021-05-22 DIAGNOSIS — J449 Chronic obstructive pulmonary disease, unspecified: Secondary | ICD-10-CM | POA: Diagnosis not present

## 2021-05-22 DIAGNOSIS — M810 Age-related osteoporosis without current pathological fracture: Secondary | ICD-10-CM | POA: Diagnosis not present

## 2021-05-22 DIAGNOSIS — Z993 Dependence on wheelchair: Secondary | ICD-10-CM | POA: Diagnosis not present

## 2021-05-22 DIAGNOSIS — K219 Gastro-esophageal reflux disease without esophagitis: Secondary | ICD-10-CM | POA: Diagnosis not present

## 2021-05-22 DIAGNOSIS — I872 Venous insufficiency (chronic) (peripheral): Secondary | ICD-10-CM | POA: Diagnosis not present

## 2021-05-22 DIAGNOSIS — I959 Hypotension, unspecified: Secondary | ICD-10-CM | POA: Diagnosis not present

## 2021-05-22 DIAGNOSIS — I5033 Acute on chronic diastolic (congestive) heart failure: Secondary | ICD-10-CM | POA: Diagnosis not present

## 2021-05-22 DIAGNOSIS — I110 Hypertensive heart disease with heart failure: Secondary | ICD-10-CM | POA: Diagnosis not present

## 2021-05-22 DIAGNOSIS — G4700 Insomnia, unspecified: Secondary | ICD-10-CM | POA: Diagnosis not present

## 2021-05-22 DIAGNOSIS — N8110 Cystocele, unspecified: Secondary | ICD-10-CM | POA: Diagnosis not present

## 2021-05-22 DIAGNOSIS — Z9181 History of falling: Secondary | ICD-10-CM | POA: Diagnosis not present

## 2021-05-22 DIAGNOSIS — N390 Urinary tract infection, site not specified: Secondary | ICD-10-CM | POA: Diagnosis not present

## 2021-05-22 DIAGNOSIS — I350 Nonrheumatic aortic (valve) stenosis: Secondary | ICD-10-CM | POA: Diagnosis not present

## 2021-05-23 DIAGNOSIS — E039 Hypothyroidism, unspecified: Secondary | ICD-10-CM | POA: Diagnosis not present

## 2021-05-23 DIAGNOSIS — Z993 Dependence on wheelchair: Secondary | ICD-10-CM | POA: Diagnosis not present

## 2021-05-23 DIAGNOSIS — I5033 Acute on chronic diastolic (congestive) heart failure: Secondary | ICD-10-CM | POA: Diagnosis not present

## 2021-05-23 DIAGNOSIS — M810 Age-related osteoporosis without current pathological fracture: Secondary | ICD-10-CM | POA: Diagnosis not present

## 2021-05-23 DIAGNOSIS — N8110 Cystocele, unspecified: Secondary | ICD-10-CM | POA: Diagnosis not present

## 2021-05-23 DIAGNOSIS — J449 Chronic obstructive pulmonary disease, unspecified: Secondary | ICD-10-CM | POA: Diagnosis not present

## 2021-05-23 DIAGNOSIS — I872 Venous insufficiency (chronic) (peripheral): Secondary | ICD-10-CM | POA: Diagnosis not present

## 2021-05-23 DIAGNOSIS — I350 Nonrheumatic aortic (valve) stenosis: Secondary | ICD-10-CM | POA: Diagnosis not present

## 2021-05-23 DIAGNOSIS — I959 Hypotension, unspecified: Secondary | ICD-10-CM | POA: Diagnosis not present

## 2021-05-23 DIAGNOSIS — K219 Gastro-esophageal reflux disease without esophagitis: Secondary | ICD-10-CM | POA: Diagnosis not present

## 2021-05-23 DIAGNOSIS — G4700 Insomnia, unspecified: Secondary | ICD-10-CM | POA: Diagnosis not present

## 2021-05-23 DIAGNOSIS — I110 Hypertensive heart disease with heart failure: Secondary | ICD-10-CM | POA: Diagnosis not present

## 2021-05-23 DIAGNOSIS — Z9181 History of falling: Secondary | ICD-10-CM | POA: Diagnosis not present

## 2021-05-23 DIAGNOSIS — N390 Urinary tract infection, site not specified: Secondary | ICD-10-CM | POA: Diagnosis not present

## 2021-05-26 DIAGNOSIS — K219 Gastro-esophageal reflux disease without esophagitis: Secondary | ICD-10-CM | POA: Diagnosis not present

## 2021-05-26 DIAGNOSIS — N8110 Cystocele, unspecified: Secondary | ICD-10-CM | POA: Diagnosis not present

## 2021-05-26 DIAGNOSIS — Z9181 History of falling: Secondary | ICD-10-CM | POA: Diagnosis not present

## 2021-05-26 DIAGNOSIS — M810 Age-related osteoporosis without current pathological fracture: Secondary | ICD-10-CM | POA: Diagnosis not present

## 2021-05-26 DIAGNOSIS — J449 Chronic obstructive pulmonary disease, unspecified: Secondary | ICD-10-CM | POA: Diagnosis not present

## 2021-05-26 DIAGNOSIS — G4700 Insomnia, unspecified: Secondary | ICD-10-CM | POA: Diagnosis not present

## 2021-05-26 DIAGNOSIS — I872 Venous insufficiency (chronic) (peripheral): Secondary | ICD-10-CM | POA: Diagnosis not present

## 2021-05-26 DIAGNOSIS — I5033 Acute on chronic diastolic (congestive) heart failure: Secondary | ICD-10-CM | POA: Diagnosis not present

## 2021-05-26 DIAGNOSIS — E039 Hypothyroidism, unspecified: Secondary | ICD-10-CM | POA: Diagnosis not present

## 2021-05-26 DIAGNOSIS — I110 Hypertensive heart disease with heart failure: Secondary | ICD-10-CM | POA: Diagnosis not present

## 2021-05-26 DIAGNOSIS — I350 Nonrheumatic aortic (valve) stenosis: Secondary | ICD-10-CM | POA: Diagnosis not present

## 2021-05-26 DIAGNOSIS — N390 Urinary tract infection, site not specified: Secondary | ICD-10-CM | POA: Diagnosis not present

## 2021-05-26 DIAGNOSIS — I959 Hypotension, unspecified: Secondary | ICD-10-CM | POA: Diagnosis not present

## 2021-05-26 DIAGNOSIS — Z993 Dependence on wheelchair: Secondary | ICD-10-CM | POA: Diagnosis not present

## 2021-05-28 DIAGNOSIS — G4700 Insomnia, unspecified: Secondary | ICD-10-CM | POA: Diagnosis not present

## 2021-05-28 DIAGNOSIS — K219 Gastro-esophageal reflux disease without esophagitis: Secondary | ICD-10-CM | POA: Diagnosis not present

## 2021-05-28 DIAGNOSIS — I5033 Acute on chronic diastolic (congestive) heart failure: Secondary | ICD-10-CM | POA: Diagnosis not present

## 2021-05-28 DIAGNOSIS — I350 Nonrheumatic aortic (valve) stenosis: Secondary | ICD-10-CM | POA: Diagnosis not present

## 2021-05-28 DIAGNOSIS — E039 Hypothyroidism, unspecified: Secondary | ICD-10-CM | POA: Diagnosis not present

## 2021-05-28 DIAGNOSIS — N390 Urinary tract infection, site not specified: Secondary | ICD-10-CM | POA: Diagnosis not present

## 2021-05-28 DIAGNOSIS — I110 Hypertensive heart disease with heart failure: Secondary | ICD-10-CM | POA: Diagnosis not present

## 2021-05-28 DIAGNOSIS — I959 Hypotension, unspecified: Secondary | ICD-10-CM | POA: Diagnosis not present

## 2021-05-28 DIAGNOSIS — N8110 Cystocele, unspecified: Secondary | ICD-10-CM | POA: Diagnosis not present

## 2021-05-28 DIAGNOSIS — Z9181 History of falling: Secondary | ICD-10-CM | POA: Diagnosis not present

## 2021-05-28 DIAGNOSIS — I872 Venous insufficiency (chronic) (peripheral): Secondary | ICD-10-CM | POA: Diagnosis not present

## 2021-05-28 DIAGNOSIS — J449 Chronic obstructive pulmonary disease, unspecified: Secondary | ICD-10-CM | POA: Diagnosis not present

## 2021-05-28 DIAGNOSIS — M810 Age-related osteoporosis without current pathological fracture: Secondary | ICD-10-CM | POA: Diagnosis not present

## 2021-05-28 DIAGNOSIS — Z993 Dependence on wheelchair: Secondary | ICD-10-CM | POA: Diagnosis not present

## 2021-05-30 DIAGNOSIS — I872 Venous insufficiency (chronic) (peripheral): Secondary | ICD-10-CM | POA: Diagnosis not present

## 2021-05-30 DIAGNOSIS — N390 Urinary tract infection, site not specified: Secondary | ICD-10-CM | POA: Diagnosis not present

## 2021-05-30 DIAGNOSIS — N8110 Cystocele, unspecified: Secondary | ICD-10-CM | POA: Diagnosis not present

## 2021-05-30 DIAGNOSIS — E039 Hypothyroidism, unspecified: Secondary | ICD-10-CM | POA: Diagnosis not present

## 2021-05-30 DIAGNOSIS — I110 Hypertensive heart disease with heart failure: Secondary | ICD-10-CM | POA: Diagnosis not present

## 2021-05-30 DIAGNOSIS — K219 Gastro-esophageal reflux disease without esophagitis: Secondary | ICD-10-CM | POA: Diagnosis not present

## 2021-05-30 DIAGNOSIS — I959 Hypotension, unspecified: Secondary | ICD-10-CM | POA: Diagnosis not present

## 2021-05-30 DIAGNOSIS — M810 Age-related osteoporosis without current pathological fracture: Secondary | ICD-10-CM | POA: Diagnosis not present

## 2021-05-30 DIAGNOSIS — I350 Nonrheumatic aortic (valve) stenosis: Secondary | ICD-10-CM | POA: Diagnosis not present

## 2021-05-30 DIAGNOSIS — I5033 Acute on chronic diastolic (congestive) heart failure: Secondary | ICD-10-CM | POA: Diagnosis not present

## 2021-05-30 DIAGNOSIS — Z9181 History of falling: Secondary | ICD-10-CM | POA: Diagnosis not present

## 2021-05-30 DIAGNOSIS — J449 Chronic obstructive pulmonary disease, unspecified: Secondary | ICD-10-CM | POA: Diagnosis not present

## 2021-05-30 DIAGNOSIS — G4700 Insomnia, unspecified: Secondary | ICD-10-CM | POA: Diagnosis not present

## 2021-05-30 DIAGNOSIS — Z993 Dependence on wheelchair: Secondary | ICD-10-CM | POA: Diagnosis not present

## 2021-05-31 DIAGNOSIS — I5033 Acute on chronic diastolic (congestive) heart failure: Secondary | ICD-10-CM | POA: Diagnosis not present

## 2021-06-02 ENCOUNTER — Other Ambulatory Visit: Payer: Self-pay | Admitting: Internal Medicine

## 2021-06-02 DIAGNOSIS — M810 Age-related osteoporosis without current pathological fracture: Secondary | ICD-10-CM | POA: Diagnosis not present

## 2021-06-02 DIAGNOSIS — G4700 Insomnia, unspecified: Secondary | ICD-10-CM | POA: Diagnosis not present

## 2021-06-02 DIAGNOSIS — I959 Hypotension, unspecified: Secondary | ICD-10-CM | POA: Diagnosis not present

## 2021-06-02 DIAGNOSIS — N8110 Cystocele, unspecified: Secondary | ICD-10-CM | POA: Diagnosis not present

## 2021-06-02 DIAGNOSIS — I350 Nonrheumatic aortic (valve) stenosis: Secondary | ICD-10-CM | POA: Diagnosis not present

## 2021-06-02 DIAGNOSIS — N390 Urinary tract infection, site not specified: Secondary | ICD-10-CM | POA: Diagnosis not present

## 2021-06-02 DIAGNOSIS — Z993 Dependence on wheelchair: Secondary | ICD-10-CM | POA: Diagnosis not present

## 2021-06-02 DIAGNOSIS — Z9181 History of falling: Secondary | ICD-10-CM | POA: Diagnosis not present

## 2021-06-02 DIAGNOSIS — K219 Gastro-esophageal reflux disease without esophagitis: Secondary | ICD-10-CM | POA: Diagnosis not present

## 2021-06-02 DIAGNOSIS — I5033 Acute on chronic diastolic (congestive) heart failure: Secondary | ICD-10-CM | POA: Diagnosis not present

## 2021-06-02 DIAGNOSIS — I872 Venous insufficiency (chronic) (peripheral): Secondary | ICD-10-CM | POA: Diagnosis not present

## 2021-06-02 DIAGNOSIS — I110 Hypertensive heart disease with heart failure: Secondary | ICD-10-CM | POA: Diagnosis not present

## 2021-06-02 DIAGNOSIS — E039 Hypothyroidism, unspecified: Secondary | ICD-10-CM | POA: Diagnosis not present

## 2021-06-02 DIAGNOSIS — J449 Chronic obstructive pulmonary disease, unspecified: Secondary | ICD-10-CM | POA: Diagnosis not present

## 2021-06-05 ENCOUNTER — Other Ambulatory Visit: Payer: Self-pay | Admitting: Internal Medicine

## 2021-06-05 DIAGNOSIS — K219 Gastro-esophageal reflux disease without esophagitis: Secondary | ICD-10-CM | POA: Diagnosis not present

## 2021-06-05 DIAGNOSIS — I872 Venous insufficiency (chronic) (peripheral): Secondary | ICD-10-CM | POA: Diagnosis not present

## 2021-06-05 DIAGNOSIS — I959 Hypotension, unspecified: Secondary | ICD-10-CM | POA: Diagnosis not present

## 2021-06-05 DIAGNOSIS — E039 Hypothyroidism, unspecified: Secondary | ICD-10-CM | POA: Diagnosis not present

## 2021-06-05 DIAGNOSIS — M810 Age-related osteoporosis without current pathological fracture: Secondary | ICD-10-CM | POA: Diagnosis not present

## 2021-06-05 DIAGNOSIS — Z9181 History of falling: Secondary | ICD-10-CM | POA: Diagnosis not present

## 2021-06-05 DIAGNOSIS — I110 Hypertensive heart disease with heart failure: Secondary | ICD-10-CM | POA: Diagnosis not present

## 2021-06-05 DIAGNOSIS — I5033 Acute on chronic diastolic (congestive) heart failure: Secondary | ICD-10-CM | POA: Diagnosis not present

## 2021-06-05 DIAGNOSIS — J449 Chronic obstructive pulmonary disease, unspecified: Secondary | ICD-10-CM | POA: Diagnosis not present

## 2021-06-05 DIAGNOSIS — N390 Urinary tract infection, site not specified: Secondary | ICD-10-CM | POA: Diagnosis not present

## 2021-06-05 DIAGNOSIS — N8110 Cystocele, unspecified: Secondary | ICD-10-CM | POA: Diagnosis not present

## 2021-06-05 DIAGNOSIS — Z993 Dependence on wheelchair: Secondary | ICD-10-CM | POA: Diagnosis not present

## 2021-06-05 DIAGNOSIS — G4700 Insomnia, unspecified: Secondary | ICD-10-CM | POA: Diagnosis not present

## 2021-06-05 DIAGNOSIS — I350 Nonrheumatic aortic (valve) stenosis: Secondary | ICD-10-CM | POA: Diagnosis not present

## 2021-06-05 MED ORDER — NITROFURANTOIN MONOHYD MACRO 100 MG PO CAPS: 100.0000 mg | ORAL_CAPSULE | Freq: Two times a day (BID) | ORAL | 0 refills | Status: AC

## 2021-06-08 DIAGNOSIS — I5033 Acute on chronic diastolic (congestive) heart failure: Secondary | ICD-10-CM | POA: Diagnosis not present

## 2021-06-10 DIAGNOSIS — N8110 Cystocele, unspecified: Secondary | ICD-10-CM | POA: Diagnosis not present

## 2021-06-10 DIAGNOSIS — N390 Urinary tract infection, site not specified: Secondary | ICD-10-CM | POA: Diagnosis not present

## 2021-06-10 DIAGNOSIS — I959 Hypotension, unspecified: Secondary | ICD-10-CM | POA: Diagnosis not present

## 2021-06-10 DIAGNOSIS — K219 Gastro-esophageal reflux disease without esophagitis: Secondary | ICD-10-CM | POA: Diagnosis not present

## 2021-06-10 DIAGNOSIS — E039 Hypothyroidism, unspecified: Secondary | ICD-10-CM | POA: Diagnosis not present

## 2021-06-10 DIAGNOSIS — I350 Nonrheumatic aortic (valve) stenosis: Secondary | ICD-10-CM | POA: Diagnosis not present

## 2021-06-10 DIAGNOSIS — G4700 Insomnia, unspecified: Secondary | ICD-10-CM | POA: Diagnosis not present

## 2021-06-10 DIAGNOSIS — I110 Hypertensive heart disease with heart failure: Secondary | ICD-10-CM | POA: Diagnosis not present

## 2021-06-10 DIAGNOSIS — Z993 Dependence on wheelchair: Secondary | ICD-10-CM | POA: Diagnosis not present

## 2021-06-10 DIAGNOSIS — J449 Chronic obstructive pulmonary disease, unspecified: Secondary | ICD-10-CM | POA: Diagnosis not present

## 2021-06-10 DIAGNOSIS — I872 Venous insufficiency (chronic) (peripheral): Secondary | ICD-10-CM | POA: Diagnosis not present

## 2021-06-10 DIAGNOSIS — Z9181 History of falling: Secondary | ICD-10-CM | POA: Diagnosis not present

## 2021-06-10 DIAGNOSIS — M810 Age-related osteoporosis without current pathological fracture: Secondary | ICD-10-CM | POA: Diagnosis not present

## 2021-06-10 DIAGNOSIS — I5033 Acute on chronic diastolic (congestive) heart failure: Secondary | ICD-10-CM | POA: Diagnosis not present

## 2021-06-11 DIAGNOSIS — Z9181 History of falling: Secondary | ICD-10-CM | POA: Diagnosis not present

## 2021-06-11 DIAGNOSIS — N8110 Cystocele, unspecified: Secondary | ICD-10-CM | POA: Diagnosis not present

## 2021-06-11 DIAGNOSIS — M810 Age-related osteoporosis without current pathological fracture: Secondary | ICD-10-CM | POA: Diagnosis not present

## 2021-06-11 DIAGNOSIS — N390 Urinary tract infection, site not specified: Secondary | ICD-10-CM | POA: Diagnosis not present

## 2021-06-11 DIAGNOSIS — I110 Hypertensive heart disease with heart failure: Secondary | ICD-10-CM | POA: Diagnosis not present

## 2021-06-11 DIAGNOSIS — E039 Hypothyroidism, unspecified: Secondary | ICD-10-CM | POA: Diagnosis not present

## 2021-06-11 DIAGNOSIS — G4700 Insomnia, unspecified: Secondary | ICD-10-CM | POA: Diagnosis not present

## 2021-06-11 DIAGNOSIS — K219 Gastro-esophageal reflux disease without esophagitis: Secondary | ICD-10-CM | POA: Diagnosis not present

## 2021-06-11 DIAGNOSIS — I959 Hypotension, unspecified: Secondary | ICD-10-CM | POA: Diagnosis not present

## 2021-06-11 DIAGNOSIS — I5033 Acute on chronic diastolic (congestive) heart failure: Secondary | ICD-10-CM | POA: Diagnosis not present

## 2021-06-11 DIAGNOSIS — Z993 Dependence on wheelchair: Secondary | ICD-10-CM | POA: Diagnosis not present

## 2021-06-11 DIAGNOSIS — I350 Nonrheumatic aortic (valve) stenosis: Secondary | ICD-10-CM | POA: Diagnosis not present

## 2021-06-11 DIAGNOSIS — J449 Chronic obstructive pulmonary disease, unspecified: Secondary | ICD-10-CM | POA: Diagnosis not present

## 2021-06-11 DIAGNOSIS — I872 Venous insufficiency (chronic) (peripheral): Secondary | ICD-10-CM | POA: Diagnosis not present

## 2021-06-12 DIAGNOSIS — N390 Urinary tract infection, site not specified: Secondary | ICD-10-CM | POA: Diagnosis not present

## 2021-06-12 DIAGNOSIS — G4700 Insomnia, unspecified: Secondary | ICD-10-CM | POA: Diagnosis not present

## 2021-06-12 DIAGNOSIS — K219 Gastro-esophageal reflux disease without esophagitis: Secondary | ICD-10-CM | POA: Diagnosis not present

## 2021-06-12 DIAGNOSIS — E039 Hypothyroidism, unspecified: Secondary | ICD-10-CM | POA: Diagnosis not present

## 2021-06-12 DIAGNOSIS — N8110 Cystocele, unspecified: Secondary | ICD-10-CM | POA: Diagnosis not present

## 2021-06-12 DIAGNOSIS — I959 Hypotension, unspecified: Secondary | ICD-10-CM | POA: Diagnosis not present

## 2021-06-12 DIAGNOSIS — J449 Chronic obstructive pulmonary disease, unspecified: Secondary | ICD-10-CM | POA: Diagnosis not present

## 2021-06-12 DIAGNOSIS — Z9181 History of falling: Secondary | ICD-10-CM | POA: Diagnosis not present

## 2021-06-12 DIAGNOSIS — I5033 Acute on chronic diastolic (congestive) heart failure: Secondary | ICD-10-CM | POA: Diagnosis not present

## 2021-06-12 DIAGNOSIS — I350 Nonrheumatic aortic (valve) stenosis: Secondary | ICD-10-CM | POA: Diagnosis not present

## 2021-06-12 DIAGNOSIS — Z993 Dependence on wheelchair: Secondary | ICD-10-CM | POA: Diagnosis not present

## 2021-06-12 DIAGNOSIS — M810 Age-related osteoporosis without current pathological fracture: Secondary | ICD-10-CM | POA: Diagnosis not present

## 2021-06-12 DIAGNOSIS — I110 Hypertensive heart disease with heart failure: Secondary | ICD-10-CM | POA: Diagnosis not present

## 2021-06-12 DIAGNOSIS — I872 Venous insufficiency (chronic) (peripheral): Secondary | ICD-10-CM | POA: Diagnosis not present

## 2021-06-13 DIAGNOSIS — I872 Venous insufficiency (chronic) (peripheral): Secondary | ICD-10-CM | POA: Diagnosis not present

## 2021-06-13 DIAGNOSIS — N8110 Cystocele, unspecified: Secondary | ICD-10-CM | POA: Diagnosis not present

## 2021-06-13 DIAGNOSIS — I350 Nonrheumatic aortic (valve) stenosis: Secondary | ICD-10-CM | POA: Diagnosis not present

## 2021-06-13 DIAGNOSIS — I959 Hypotension, unspecified: Secondary | ICD-10-CM | POA: Diagnosis not present

## 2021-06-13 DIAGNOSIS — G4700 Insomnia, unspecified: Secondary | ICD-10-CM | POA: Diagnosis not present

## 2021-06-13 DIAGNOSIS — K219 Gastro-esophageal reflux disease without esophagitis: Secondary | ICD-10-CM | POA: Diagnosis not present

## 2021-06-13 DIAGNOSIS — N390 Urinary tract infection, site not specified: Secondary | ICD-10-CM | POA: Diagnosis not present

## 2021-06-13 DIAGNOSIS — M810 Age-related osteoporosis without current pathological fracture: Secondary | ICD-10-CM | POA: Diagnosis not present

## 2021-06-13 DIAGNOSIS — I5033 Acute on chronic diastolic (congestive) heart failure: Secondary | ICD-10-CM | POA: Diagnosis not present

## 2021-06-13 DIAGNOSIS — Z9181 History of falling: Secondary | ICD-10-CM | POA: Diagnosis not present

## 2021-06-13 DIAGNOSIS — E039 Hypothyroidism, unspecified: Secondary | ICD-10-CM | POA: Diagnosis not present

## 2021-06-13 DIAGNOSIS — J449 Chronic obstructive pulmonary disease, unspecified: Secondary | ICD-10-CM | POA: Diagnosis not present

## 2021-06-13 DIAGNOSIS — Z993 Dependence on wheelchair: Secondary | ICD-10-CM | POA: Diagnosis not present

## 2021-06-13 DIAGNOSIS — I110 Hypertensive heart disease with heart failure: Secondary | ICD-10-CM | POA: Diagnosis not present

## 2021-06-16 DIAGNOSIS — K219 Gastro-esophageal reflux disease without esophagitis: Secondary | ICD-10-CM | POA: Diagnosis not present

## 2021-06-16 DIAGNOSIS — J449 Chronic obstructive pulmonary disease, unspecified: Secondary | ICD-10-CM | POA: Diagnosis not present

## 2021-06-16 DIAGNOSIS — Z9181 History of falling: Secondary | ICD-10-CM | POA: Diagnosis not present

## 2021-06-16 DIAGNOSIS — M810 Age-related osteoporosis without current pathological fracture: Secondary | ICD-10-CM | POA: Diagnosis not present

## 2021-06-16 DIAGNOSIS — N8110 Cystocele, unspecified: Secondary | ICD-10-CM | POA: Diagnosis not present

## 2021-06-16 DIAGNOSIS — N390 Urinary tract infection, site not specified: Secondary | ICD-10-CM | POA: Diagnosis not present

## 2021-06-16 DIAGNOSIS — I959 Hypotension, unspecified: Secondary | ICD-10-CM | POA: Diagnosis not present

## 2021-06-16 DIAGNOSIS — I110 Hypertensive heart disease with heart failure: Secondary | ICD-10-CM | POA: Diagnosis not present

## 2021-06-16 DIAGNOSIS — I5033 Acute on chronic diastolic (congestive) heart failure: Secondary | ICD-10-CM | POA: Diagnosis not present

## 2021-06-16 DIAGNOSIS — Z993 Dependence on wheelchair: Secondary | ICD-10-CM | POA: Diagnosis not present

## 2021-06-16 DIAGNOSIS — G4700 Insomnia, unspecified: Secondary | ICD-10-CM | POA: Diagnosis not present

## 2021-06-16 DIAGNOSIS — E039 Hypothyroidism, unspecified: Secondary | ICD-10-CM | POA: Diagnosis not present

## 2021-06-16 DIAGNOSIS — I872 Venous insufficiency (chronic) (peripheral): Secondary | ICD-10-CM | POA: Diagnosis not present

## 2021-06-16 DIAGNOSIS — I350 Nonrheumatic aortic (valve) stenosis: Secondary | ICD-10-CM | POA: Diagnosis not present

## 2021-06-17 DIAGNOSIS — N390 Urinary tract infection, site not specified: Secondary | ICD-10-CM | POA: Diagnosis not present

## 2021-06-17 DIAGNOSIS — I959 Hypotension, unspecified: Secondary | ICD-10-CM | POA: Diagnosis not present

## 2021-06-17 DIAGNOSIS — Z993 Dependence on wheelchair: Secondary | ICD-10-CM | POA: Diagnosis not present

## 2021-06-17 DIAGNOSIS — I872 Venous insufficiency (chronic) (peripheral): Secondary | ICD-10-CM | POA: Diagnosis not present

## 2021-06-17 DIAGNOSIS — G4700 Insomnia, unspecified: Secondary | ICD-10-CM | POA: Diagnosis not present

## 2021-06-17 DIAGNOSIS — J449 Chronic obstructive pulmonary disease, unspecified: Secondary | ICD-10-CM | POA: Diagnosis not present

## 2021-06-17 DIAGNOSIS — I5033 Acute on chronic diastolic (congestive) heart failure: Secondary | ICD-10-CM | POA: Diagnosis not present

## 2021-06-17 DIAGNOSIS — N8110 Cystocele, unspecified: Secondary | ICD-10-CM | POA: Diagnosis not present

## 2021-06-17 DIAGNOSIS — I350 Nonrheumatic aortic (valve) stenosis: Secondary | ICD-10-CM | POA: Diagnosis not present

## 2021-06-17 DIAGNOSIS — E039 Hypothyroidism, unspecified: Secondary | ICD-10-CM | POA: Diagnosis not present

## 2021-06-17 DIAGNOSIS — I110 Hypertensive heart disease with heart failure: Secondary | ICD-10-CM | POA: Diagnosis not present

## 2021-06-17 DIAGNOSIS — Z9181 History of falling: Secondary | ICD-10-CM | POA: Diagnosis not present

## 2021-06-17 DIAGNOSIS — M810 Age-related osteoporosis without current pathological fracture: Secondary | ICD-10-CM | POA: Diagnosis not present

## 2021-06-17 DIAGNOSIS — K219 Gastro-esophageal reflux disease without esophagitis: Secondary | ICD-10-CM | POA: Diagnosis not present

## 2021-06-18 DIAGNOSIS — I110 Hypertensive heart disease with heart failure: Secondary | ICD-10-CM | POA: Diagnosis not present

## 2021-06-18 DIAGNOSIS — I350 Nonrheumatic aortic (valve) stenosis: Secondary | ICD-10-CM | POA: Diagnosis not present

## 2021-06-18 DIAGNOSIS — I872 Venous insufficiency (chronic) (peripheral): Secondary | ICD-10-CM | POA: Diagnosis not present

## 2021-06-18 DIAGNOSIS — Z993 Dependence on wheelchair: Secondary | ICD-10-CM | POA: Diagnosis not present

## 2021-06-18 DIAGNOSIS — M810 Age-related osteoporosis without current pathological fracture: Secondary | ICD-10-CM | POA: Diagnosis not present

## 2021-06-18 DIAGNOSIS — I959 Hypotension, unspecified: Secondary | ICD-10-CM | POA: Diagnosis not present

## 2021-06-18 DIAGNOSIS — N390 Urinary tract infection, site not specified: Secondary | ICD-10-CM | POA: Diagnosis not present

## 2021-06-18 DIAGNOSIS — G4700 Insomnia, unspecified: Secondary | ICD-10-CM | POA: Diagnosis not present

## 2021-06-18 DIAGNOSIS — K219 Gastro-esophageal reflux disease without esophagitis: Secondary | ICD-10-CM | POA: Diagnosis not present

## 2021-06-18 DIAGNOSIS — N8110 Cystocele, unspecified: Secondary | ICD-10-CM | POA: Diagnosis not present

## 2021-06-18 DIAGNOSIS — I5033 Acute on chronic diastolic (congestive) heart failure: Secondary | ICD-10-CM | POA: Diagnosis not present

## 2021-06-18 DIAGNOSIS — Z9181 History of falling: Secondary | ICD-10-CM | POA: Diagnosis not present

## 2021-06-18 DIAGNOSIS — E039 Hypothyroidism, unspecified: Secondary | ICD-10-CM | POA: Diagnosis not present

## 2021-06-18 DIAGNOSIS — J449 Chronic obstructive pulmonary disease, unspecified: Secondary | ICD-10-CM | POA: Diagnosis not present

## 2021-06-20 ENCOUNTER — Ambulatory Visit: Payer: Medicare Other | Admitting: Podiatry

## 2021-06-20 DIAGNOSIS — I110 Hypertensive heart disease with heart failure: Secondary | ICD-10-CM | POA: Diagnosis not present

## 2021-06-20 DIAGNOSIS — E039 Hypothyroidism, unspecified: Secondary | ICD-10-CM | POA: Diagnosis not present

## 2021-06-20 DIAGNOSIS — J449 Chronic obstructive pulmonary disease, unspecified: Secondary | ICD-10-CM | POA: Diagnosis not present

## 2021-06-20 DIAGNOSIS — I350 Nonrheumatic aortic (valve) stenosis: Secondary | ICD-10-CM | POA: Diagnosis not present

## 2021-06-20 DIAGNOSIS — I959 Hypotension, unspecified: Secondary | ICD-10-CM | POA: Diagnosis not present

## 2021-06-20 DIAGNOSIS — I872 Venous insufficiency (chronic) (peripheral): Secondary | ICD-10-CM | POA: Diagnosis not present

## 2021-06-20 DIAGNOSIS — I5033 Acute on chronic diastolic (congestive) heart failure: Secondary | ICD-10-CM | POA: Diagnosis not present

## 2021-06-20 DIAGNOSIS — N8110 Cystocele, unspecified: Secondary | ICD-10-CM | POA: Diagnosis not present

## 2021-06-20 DIAGNOSIS — Z9181 History of falling: Secondary | ICD-10-CM | POA: Diagnosis not present

## 2021-06-20 DIAGNOSIS — N390 Urinary tract infection, site not specified: Secondary | ICD-10-CM | POA: Diagnosis not present

## 2021-06-20 DIAGNOSIS — Z993 Dependence on wheelchair: Secondary | ICD-10-CM | POA: Diagnosis not present

## 2021-06-20 DIAGNOSIS — M810 Age-related osteoporosis without current pathological fracture: Secondary | ICD-10-CM | POA: Diagnosis not present

## 2021-06-20 DIAGNOSIS — K219 Gastro-esophageal reflux disease without esophagitis: Secondary | ICD-10-CM | POA: Diagnosis not present

## 2021-06-20 DIAGNOSIS — G4700 Insomnia, unspecified: Secondary | ICD-10-CM | POA: Diagnosis not present

## 2021-06-22 ENCOUNTER — Other Ambulatory Visit: Payer: Self-pay | Admitting: Internal Medicine

## 2021-06-23 DIAGNOSIS — I110 Hypertensive heart disease with heart failure: Secondary | ICD-10-CM | POA: Diagnosis not present

## 2021-06-23 DIAGNOSIS — I872 Venous insufficiency (chronic) (peripheral): Secondary | ICD-10-CM | POA: Diagnosis not present

## 2021-06-23 DIAGNOSIS — G4700 Insomnia, unspecified: Secondary | ICD-10-CM | POA: Diagnosis not present

## 2021-06-23 DIAGNOSIS — N390 Urinary tract infection, site not specified: Secondary | ICD-10-CM | POA: Diagnosis not present

## 2021-06-23 DIAGNOSIS — Z993 Dependence on wheelchair: Secondary | ICD-10-CM | POA: Diagnosis not present

## 2021-06-23 DIAGNOSIS — J449 Chronic obstructive pulmonary disease, unspecified: Secondary | ICD-10-CM | POA: Diagnosis not present

## 2021-06-23 DIAGNOSIS — I959 Hypotension, unspecified: Secondary | ICD-10-CM | POA: Diagnosis not present

## 2021-06-23 DIAGNOSIS — M810 Age-related osteoporosis without current pathological fracture: Secondary | ICD-10-CM | POA: Diagnosis not present

## 2021-06-23 DIAGNOSIS — K219 Gastro-esophageal reflux disease without esophagitis: Secondary | ICD-10-CM | POA: Diagnosis not present

## 2021-06-23 DIAGNOSIS — E039 Hypothyroidism, unspecified: Secondary | ICD-10-CM | POA: Diagnosis not present

## 2021-06-23 DIAGNOSIS — I5033 Acute on chronic diastolic (congestive) heart failure: Secondary | ICD-10-CM | POA: Diagnosis not present

## 2021-06-23 DIAGNOSIS — N8110 Cystocele, unspecified: Secondary | ICD-10-CM | POA: Diagnosis not present

## 2021-06-23 DIAGNOSIS — I350 Nonrheumatic aortic (valve) stenosis: Secondary | ICD-10-CM | POA: Diagnosis not present

## 2021-06-23 DIAGNOSIS — Z9181 History of falling: Secondary | ICD-10-CM | POA: Diagnosis not present

## 2021-06-24 ENCOUNTER — Other Ambulatory Visit: Payer: Self-pay

## 2021-06-24 MED ORDER — POTASSIUM CHLORIDE ER 10 MEQ PO TBCR: 10.0000 meq | EXTENDED_RELEASE_TABLET | Freq: Every day | ORAL | 2 refills | Status: AC

## 2021-06-24 MED ORDER — METOPROLOL TARTRATE 25 MG PO TABS: 12.5000 mg | ORAL_TABLET | Freq: Two times a day (BID) | ORAL | 2 refills | Status: AC

## 2021-06-24 MED ORDER — FUROSEMIDE 20 MG PO TABS: 20.0000 mg | ORAL_TABLET | Freq: Every day | ORAL | 2 refills | Status: AC

## 2021-06-24 MED ORDER — CITALOPRAM HYDROBROMIDE 20 MG PO TABS: ORAL_TABLET | ORAL | 2 refills | Status: DC

## 2021-06-26 DIAGNOSIS — I110 Hypertensive heart disease with heart failure: Secondary | ICD-10-CM | POA: Diagnosis not present

## 2021-06-26 DIAGNOSIS — N390 Urinary tract infection, site not specified: Secondary | ICD-10-CM | POA: Diagnosis not present

## 2021-06-26 DIAGNOSIS — Z993 Dependence on wheelchair: Secondary | ICD-10-CM | POA: Diagnosis not present

## 2021-06-26 DIAGNOSIS — I350 Nonrheumatic aortic (valve) stenosis: Secondary | ICD-10-CM | POA: Diagnosis not present

## 2021-06-26 DIAGNOSIS — G4700 Insomnia, unspecified: Secondary | ICD-10-CM | POA: Diagnosis not present

## 2021-06-26 DIAGNOSIS — Z9181 History of falling: Secondary | ICD-10-CM | POA: Diagnosis not present

## 2021-06-26 DIAGNOSIS — I959 Hypotension, unspecified: Secondary | ICD-10-CM | POA: Diagnosis not present

## 2021-06-26 DIAGNOSIS — E039 Hypothyroidism, unspecified: Secondary | ICD-10-CM | POA: Diagnosis not present

## 2021-06-26 DIAGNOSIS — I5033 Acute on chronic diastolic (congestive) heart failure: Secondary | ICD-10-CM | POA: Diagnosis not present

## 2021-06-26 DIAGNOSIS — I872 Venous insufficiency (chronic) (peripheral): Secondary | ICD-10-CM | POA: Diagnosis not present

## 2021-06-26 DIAGNOSIS — M810 Age-related osteoporosis without current pathological fracture: Secondary | ICD-10-CM | POA: Diagnosis not present

## 2021-06-26 DIAGNOSIS — N8110 Cystocele, unspecified: Secondary | ICD-10-CM | POA: Diagnosis not present

## 2021-06-26 DIAGNOSIS — K219 Gastro-esophageal reflux disease without esophagitis: Secondary | ICD-10-CM | POA: Diagnosis not present

## 2021-06-26 DIAGNOSIS — J449 Chronic obstructive pulmonary disease, unspecified: Secondary | ICD-10-CM | POA: Diagnosis not present

## 2021-06-30 DIAGNOSIS — I5033 Acute on chronic diastolic (congestive) heart failure: Secondary | ICD-10-CM | POA: Diagnosis not present

## 2021-07-01 DIAGNOSIS — Z9181 History of falling: Secondary | ICD-10-CM | POA: Diagnosis not present

## 2021-07-01 DIAGNOSIS — I959 Hypotension, unspecified: Secondary | ICD-10-CM | POA: Diagnosis not present

## 2021-07-01 DIAGNOSIS — I5033 Acute on chronic diastolic (congestive) heart failure: Secondary | ICD-10-CM | POA: Diagnosis not present

## 2021-07-01 DIAGNOSIS — I872 Venous insufficiency (chronic) (peripheral): Secondary | ICD-10-CM | POA: Diagnosis not present

## 2021-07-01 DIAGNOSIS — N390 Urinary tract infection, site not specified: Secondary | ICD-10-CM | POA: Diagnosis not present

## 2021-07-01 DIAGNOSIS — Z993 Dependence on wheelchair: Secondary | ICD-10-CM | POA: Diagnosis not present

## 2021-07-01 DIAGNOSIS — J449 Chronic obstructive pulmonary disease, unspecified: Secondary | ICD-10-CM | POA: Diagnosis not present

## 2021-07-01 DIAGNOSIS — I350 Nonrheumatic aortic (valve) stenosis: Secondary | ICD-10-CM | POA: Diagnosis not present

## 2021-07-01 DIAGNOSIS — I110 Hypertensive heart disease with heart failure: Secondary | ICD-10-CM | POA: Diagnosis not present

## 2021-07-01 DIAGNOSIS — G4700 Insomnia, unspecified: Secondary | ICD-10-CM | POA: Diagnosis not present

## 2021-07-01 DIAGNOSIS — K219 Gastro-esophageal reflux disease without esophagitis: Secondary | ICD-10-CM | POA: Diagnosis not present

## 2021-07-01 DIAGNOSIS — M810 Age-related osteoporosis without current pathological fracture: Secondary | ICD-10-CM | POA: Diagnosis not present

## 2021-07-01 DIAGNOSIS — E039 Hypothyroidism, unspecified: Secondary | ICD-10-CM | POA: Diagnosis not present

## 2021-07-01 DIAGNOSIS — N8110 Cystocele, unspecified: Secondary | ICD-10-CM | POA: Diagnosis not present

## 2021-07-02 ENCOUNTER — Encounter: Payer: Self-pay | Admitting: Podiatry

## 2021-07-02 ENCOUNTER — Ambulatory Visit: Payer: Medicare Other | Admitting: Podiatry

## 2021-07-02 ENCOUNTER — Other Ambulatory Visit: Payer: Self-pay

## 2021-07-02 DIAGNOSIS — B351 Tinea unguium: Secondary | ICD-10-CM

## 2021-07-02 DIAGNOSIS — R7303 Prediabetes: Secondary | ICD-10-CM

## 2021-07-02 DIAGNOSIS — M79675 Pain in left toe(s): Secondary | ICD-10-CM

## 2021-07-02 DIAGNOSIS — M2141 Flat foot [pes planus] (acquired), right foot: Secondary | ICD-10-CM | POA: Diagnosis not present

## 2021-07-02 DIAGNOSIS — M79674 Pain in right toe(s): Secondary | ICD-10-CM | POA: Diagnosis not present

## 2021-07-02 DIAGNOSIS — R6889 Other general symptoms and signs: Secondary | ICD-10-CM | POA: Diagnosis not present

## 2021-07-02 DIAGNOSIS — M2142 Flat foot [pes planus] (acquired), left foot: Secondary | ICD-10-CM | POA: Diagnosis not present

## 2021-07-02 NOTE — Progress Notes (Signed)
This patient returns to the office for evaluation and treatment of long thick painful nails .  This patient is unable to trim her own nails since the patient cannot reach her feet.  Patient says the nails are painful .  She presents to the office in a wheelchair with her daughter. Patient has not been seen in over five months.  She returns for preventive foot care services.  General Appearance  Alert, conversant and in no acute stress.  Vascular  Dorsalis pedis and posterior tibial  pulses are  weakly palpable  bilaterally.  Capillary return is within normal limits  bilaterally. Cold feet.  bilaterally.  Absent digital hair  B/L.  Neurologic  Senn-Weinstein monofilament wire test within normal limits  bilaterally. Muscle power within normal limits bilaterally.  Nails Thick disfigured discolored nails with subungual debris  from hallux to fifth toes bilaterally. No evidence of bacterial infection or drainage bilaterally.  Orthopedic  No limitations of motion  feet .  No crepitus or effusions noted.  No bony pathology or digital deformities noted. Pes planus.  Skin  Thin shiny skin noted  with no porokeratosis noted bilaterally.  No signs of infections or ulcers noted.     Onychomycosis  Pain in toes right foot  Pain in toes left foot  Debridement  of nails  1-5  B/L with a nail nipper.  Nails were then filed using a dremel tool with no incidents.    RTC  3 months    Gardiner Barefoot DPM

## 2021-07-03 DIAGNOSIS — Z993 Dependence on wheelchair: Secondary | ICD-10-CM | POA: Diagnosis not present

## 2021-07-03 DIAGNOSIS — K219 Gastro-esophageal reflux disease without esophagitis: Secondary | ICD-10-CM | POA: Diagnosis not present

## 2021-07-03 DIAGNOSIS — N390 Urinary tract infection, site not specified: Secondary | ICD-10-CM | POA: Diagnosis not present

## 2021-07-03 DIAGNOSIS — E039 Hypothyroidism, unspecified: Secondary | ICD-10-CM | POA: Diagnosis not present

## 2021-07-03 DIAGNOSIS — J449 Chronic obstructive pulmonary disease, unspecified: Secondary | ICD-10-CM | POA: Diagnosis not present

## 2021-07-03 DIAGNOSIS — Z9181 History of falling: Secondary | ICD-10-CM | POA: Diagnosis not present

## 2021-07-03 DIAGNOSIS — I350 Nonrheumatic aortic (valve) stenosis: Secondary | ICD-10-CM | POA: Diagnosis not present

## 2021-07-03 DIAGNOSIS — N8110 Cystocele, unspecified: Secondary | ICD-10-CM | POA: Diagnosis not present

## 2021-07-03 DIAGNOSIS — I110 Hypertensive heart disease with heart failure: Secondary | ICD-10-CM | POA: Diagnosis not present

## 2021-07-03 DIAGNOSIS — G4700 Insomnia, unspecified: Secondary | ICD-10-CM | POA: Diagnosis not present

## 2021-07-03 DIAGNOSIS — I5033 Acute on chronic diastolic (congestive) heart failure: Secondary | ICD-10-CM | POA: Diagnosis not present

## 2021-07-03 DIAGNOSIS — I872 Venous insufficiency (chronic) (peripheral): Secondary | ICD-10-CM | POA: Diagnosis not present

## 2021-07-03 DIAGNOSIS — I959 Hypotension, unspecified: Secondary | ICD-10-CM | POA: Diagnosis not present

## 2021-07-03 DIAGNOSIS — M810 Age-related osteoporosis without current pathological fracture: Secondary | ICD-10-CM | POA: Diagnosis not present

## 2021-07-07 DIAGNOSIS — M810 Age-related osteoporosis without current pathological fracture: Secondary | ICD-10-CM | POA: Diagnosis not present

## 2021-07-07 DIAGNOSIS — E039 Hypothyroidism, unspecified: Secondary | ICD-10-CM | POA: Diagnosis not present

## 2021-07-07 DIAGNOSIS — Z993 Dependence on wheelchair: Secondary | ICD-10-CM | POA: Diagnosis not present

## 2021-07-07 DIAGNOSIS — I872 Venous insufficiency (chronic) (peripheral): Secondary | ICD-10-CM | POA: Diagnosis not present

## 2021-07-07 DIAGNOSIS — Z9181 History of falling: Secondary | ICD-10-CM | POA: Diagnosis not present

## 2021-07-07 DIAGNOSIS — N390 Urinary tract infection, site not specified: Secondary | ICD-10-CM | POA: Diagnosis not present

## 2021-07-07 DIAGNOSIS — K219 Gastro-esophageal reflux disease without esophagitis: Secondary | ICD-10-CM | POA: Diagnosis not present

## 2021-07-07 DIAGNOSIS — G4700 Insomnia, unspecified: Secondary | ICD-10-CM | POA: Diagnosis not present

## 2021-07-07 DIAGNOSIS — I110 Hypertensive heart disease with heart failure: Secondary | ICD-10-CM | POA: Diagnosis not present

## 2021-07-07 DIAGNOSIS — I350 Nonrheumatic aortic (valve) stenosis: Secondary | ICD-10-CM | POA: Diagnosis not present

## 2021-07-07 DIAGNOSIS — I5033 Acute on chronic diastolic (congestive) heart failure: Secondary | ICD-10-CM | POA: Diagnosis not present

## 2021-07-07 DIAGNOSIS — J449 Chronic obstructive pulmonary disease, unspecified: Secondary | ICD-10-CM | POA: Diagnosis not present

## 2021-07-07 DIAGNOSIS — N8110 Cystocele, unspecified: Secondary | ICD-10-CM | POA: Diagnosis not present

## 2021-07-07 DIAGNOSIS — I959 Hypotension, unspecified: Secondary | ICD-10-CM | POA: Diagnosis not present

## 2021-07-09 ENCOUNTER — Other Ambulatory Visit: Payer: Self-pay

## 2021-07-09 ENCOUNTER — Encounter: Payer: Self-pay | Admitting: Adult Health

## 2021-07-09 ENCOUNTER — Ambulatory Visit (INDEPENDENT_AMBULATORY_CARE_PROVIDER_SITE_OTHER): Payer: Medicare Other | Admitting: Adult Health

## 2021-07-09 VITALS — BP 104/64 | HR 75 | Temp 97.3°F

## 2021-07-09 DIAGNOSIS — I872 Venous insufficiency (chronic) (peripheral): Secondary | ICD-10-CM | POA: Diagnosis not present

## 2021-07-09 DIAGNOSIS — I83893 Varicose veins of bilateral lower extremities with other complications: Secondary | ICD-10-CM

## 2021-07-09 DIAGNOSIS — I959 Hypotension, unspecified: Secondary | ICD-10-CM | POA: Diagnosis not present

## 2021-07-09 DIAGNOSIS — N8110 Cystocele, unspecified: Secondary | ICD-10-CM | POA: Diagnosis not present

## 2021-07-09 DIAGNOSIS — I350 Nonrheumatic aortic (valve) stenosis: Secondary | ICD-10-CM | POA: Diagnosis not present

## 2021-07-09 DIAGNOSIS — K219 Gastro-esophageal reflux disease without esophagitis: Secondary | ICD-10-CM

## 2021-07-09 DIAGNOSIS — I110 Hypertensive heart disease with heart failure: Secondary | ICD-10-CM | POA: Diagnosis not present

## 2021-07-09 DIAGNOSIS — Z0000 Encounter for general adult medical examination without abnormal findings: Secondary | ICD-10-CM

## 2021-07-09 DIAGNOSIS — E559 Vitamin D deficiency, unspecified: Secondary | ICD-10-CM | POA: Diagnosis not present

## 2021-07-09 DIAGNOSIS — J42 Unspecified chronic bronchitis: Secondary | ICD-10-CM

## 2021-07-09 DIAGNOSIS — Z0001 Encounter for general adult medical examination with abnormal findings: Secondary | ICD-10-CM | POA: Diagnosis not present

## 2021-07-09 DIAGNOSIS — M810 Age-related osteoporosis without current pathological fracture: Secondary | ICD-10-CM

## 2021-07-09 DIAGNOSIS — E038 Other specified hypothyroidism: Secondary | ICD-10-CM

## 2021-07-09 DIAGNOSIS — G4700 Insomnia, unspecified: Secondary | ICD-10-CM | POA: Diagnosis not present

## 2021-07-09 DIAGNOSIS — R6889 Other general symptoms and signs: Secondary | ICD-10-CM

## 2021-07-09 DIAGNOSIS — Z9181 History of falling: Secondary | ICD-10-CM | POA: Diagnosis not present

## 2021-07-09 DIAGNOSIS — R7309 Other abnormal glucose: Secondary | ICD-10-CM | POA: Diagnosis not present

## 2021-07-09 DIAGNOSIS — E039 Hypothyroidism, unspecified: Secondary | ICD-10-CM | POA: Diagnosis not present

## 2021-07-09 DIAGNOSIS — N390 Urinary tract infection, site not specified: Secondary | ICD-10-CM

## 2021-07-09 DIAGNOSIS — I5033 Acute on chronic diastolic (congestive) heart failure: Secondary | ICD-10-CM | POA: Diagnosis not present

## 2021-07-09 DIAGNOSIS — I10 Essential (primary) hypertension: Secondary | ICD-10-CM

## 2021-07-09 DIAGNOSIS — Z993 Dependence on wheelchair: Secondary | ICD-10-CM | POA: Diagnosis not present

## 2021-07-09 DIAGNOSIS — F325 Major depressive disorder, single episode, in full remission: Secondary | ICD-10-CM

## 2021-07-09 DIAGNOSIS — J449 Chronic obstructive pulmonary disease, unspecified: Secondary | ICD-10-CM | POA: Diagnosis not present

## 2021-07-09 MED ORDER — NITROFURANTOIN MONOHYD MACRO 100 MG PO CAPS: ORAL_CAPSULE | ORAL | 1 refills | Status: AC

## 2021-07-09 MED ORDER — DULOXETINE HCL 20 MG PO CPEP: 20.0000 mg | ORAL_CAPSULE | Freq: Every day | ORAL | 3 refills | Status: AC

## 2021-07-09 NOTE — Patient Instructions (Signed)
Duloxetine Delayed-Release Capsules What is this medication? DULOXETINE (doo LOX e teen) treats depression, anxiety, fibromyalgia, and certain types of chronic pain such as nerve, bone, or joint pain. It increases the amount of serotonin and norepinephrine in the brain, hormones that helpregulate mood and pain. It belongs to a group of medications called SNRIs. This medicine may be used for other purposes; ask your health care provider orpharmacist if you have questions. COMMON BRAND NAME(S): Cymbalta, Drizalma, Irenka What should I tell my care team before I take this medication? They need to know if you have any of these conditions: Bipolar disorder Glaucoma High blood pressure Kidney disease Liver disease Seizures Suicidal thoughts, plans or attempt; a previous suicide attempt by you or a family member Take medications that treat or prevent blood clots Taken medications called MAOIs like Carbex, Eldepryl, Marplan, Nardil, and Parnate within 14 days Trouble passing urine An unusual reaction to duloxetine, other medications, foods, dyes, or preservatives Pregnant or trying to get pregnant Breast-feeding How should I use this medication? Take this medication by mouth with a glass of water. Follow the directions on the prescription label. Do not crush, cut or chew some capsules of this medication. Some capsules may be opened and sprinkled on applesauce. Check with your care team or pharmacist if you are not sure. You can take this medication with or without food. Take your medication at regular intervals. Do not take your medication more often than directed. Do not stop taking this medication suddenly except upon the advice of your care team. Stopping this medication tooquickly may cause serious side effects or your condition may worsen. A special MedGuide will be given to you by the pharmacist with eachprescription and refill. Be sure to read this information carefully each time. Talk to your  care team regarding the use of this medication in children. While this medication may be prescribed for children as young as 44 years of age forselected conditions, precautions do apply. Overdosage: If you think you have taken too much of this medicine contact apoison control center or emergency room at once. NOTE: This medicine is only for you. Do not share this medicine with others. What if I miss a dose? If you miss a dose, take it as soon as you can. If it is almost time for yournext dose, take only that dose. Do not take double or extra doses. What may interact with this medication? Do not take this medication with any of the following: Desvenlafaxine Levomilnacipran Linezolid MAOIs like Carbex, Eldepryl, Emsam, Marplan, Nardil, and Parnate Methylene blue (injected into a vein) Milnacipran Safinamide Thioridazine Venlafaxine Viloxazine This medication may also interact with the following: Alcohol Amphetamines Aspirin and aspirin-like medications Certain antibiotics like ciprofloxacin and enoxacin Certain medications for blood pressure, heart disease, irregular heart beat Certain medications for depression, anxiety, or psychotic disturbances Certain medications for migraine headache like almotriptan, eletriptan, frovatriptan, naratriptan, rizatriptan, sumatriptan, zolmitriptan Certain medications that treat or prevent blood clots like warfarin, enoxaparin, and dalteparin Cimetidine Fentanyl Lithium NSAIDS, medications for pain and inflammation, like ibuprofen or naproxen Phentermine Procarbazine Rasagiline Sibutramine St. John's wort Theophylline Tramadol Tryptophan This list may not describe all possible interactions. Give your health care provider a list of all the medicines, herbs, non-prescription drugs, or dietary supplements you use. Also tell them if you smoke, drink alcohol, or use illegaldrugs. Some items may interact with your medicine. What should I watch for  while using this medication? Tell your care team if your symptoms do not get  better or if they get worse. Visit your care team for regular checks on your progress. Because it may take several weeks to see the full effects of this medication, it is important tocontinue your treatment as prescribed by your care team. This medication may cause serious skin reactions. They can happen weeks to months after starting the medication. Contact your care team right away if you notice fevers or flu-like symptoms with a rash. The rash may be red or purple and then turn into blisters or peeling of the skin. Or, you might notice a red rash with swelling of the face, lips, or lymph nodes in your neck or under yourarms. Watch for new or worsening thoughts of suicide or depression. This includes sudden changes in mood, behaviors, or thoughts. These changes can happen at any time but are more common in the beginning of treatment or after a change in dose. Call your care team right away if you experience these thoughts orworsening depression. Manic episodes may happen in patients with bipolar disorder who take this medication. Watch for changes in feelings or behaviors such as feeling anxious, nervous, agitated, panicky, irritable, hostile, aggressive, impulsive, severely restless, overly excited and hyperactive, or trouble sleeping. These symptoms can happen at any time, but are more common in the beginning of treatment or after a change in dose. Call your care team right away if you notice any ofthese symptoms. You may get drowsy or dizzy. Do not drive, use machinery, or do anything that needs mental alertness until you know how this medication affects you. Do not stand or sit up quickly, especially if you are an older patient. This reduces the risk of dizzy or fainting spells. Alcohol may interfere with the effect ofthis medication. Avoid alcoholic drinks. This medication may increase blood sugar. The risk may be higher in  patients who already have diabetes. Ask your care team what you can do to lower yourrisk of diabetes while taking this medication. This medication can cause an increase in blood pressure. This medication can also cause a sudden drop in your blood pressure, which may make you feel faint and increase the chance of a fall. These effects are most common when you first start the medication or when the dose is increased, or during use of other medications that can cause a sudden drop in blood pressure. Check with your care team for instructions on monitoring your blood pressure while taking thismedication. Your mouth may get dry. Chewing sugarless gum or sucking hard candy, and drinking plenty of water, may help. Contact your care team if the problem doesnot go away or is severe. What side effects may I notice from receiving this medication? Side effects that you should report to your care team as soon as possible: Allergic reactions-skin rash, itching, hives, swelling of the face, lips, tongue, or throat Bleeding-bloody or black, tar-like stools, red or dark brown urine, vomiting blood or brown material that looks like coffee grounds, small, red or purple spots on skin, unusual bleeding or bruising Increase in blood pressure Liver injury-right upper belly pain, loss of appetite, nausea, light-colored stool, dark yellow or brown urine, yellowing skin or eyes, unusual weakness or fatigue Low sodium level-muscle weakness, fatigue, dizziness, headache, confusion Redness, blistering, peeling, or loosening of the skin, including inside the mouth Serotonin syndrome-irritability, confusion, fast or irregular heartbeat, muscle stiffness, twitching muscles, sweating, high fever, seizures, chills, vomiting, diarrhea Sudden eye pain or change in vision such as blurry vision, seeing halos around lights, vision  loss Thoughts of suicide or self-harm, worsening mood, feelings of depression Trouble passing urine Side  effects that usually do not require medical attention (report to your careteam if they continue or are bothersome): Change in sex drive or performance Constipation Diarrhea Dizziness Dry mouth Excessive sweating Loss of appetite Nausea Vomiting This list may not describe all possible side effects. Call your doctor for medical advice about side effects. You may report side effects to FDA at1-800-FDA-1088. Where should I keep my medication? Keep out of the reach of children and pets. Store at room temperature between 15 and 30 degrees C (59 to 86 degrees F). Getrid of any unused medication after the expiration date. To get rid of medications that are no longer needed or have expired: Take the medication to a medication take-back program. Check with your pharmacy or law enforcement to find a location. If you cannot return the medication, check the label or package insert to see if the medication should be thrown out in the garbage or flushed down the toilet. If you are not sure, ask your care team. If it is safe to put it in the trash, take the medication out of the container. Mix the medication with cat litter, dirt, coffee grounds, or other unwanted substance. Seal the mixture in a bag or container. Put it in the trash. NOTE: This sheet is a summary. It may not cover all possible information. If you have questions about this medicine, talk to your doctor, pharmacist, orhealth care provider.  2022 [employer] (2020-12-02 12:32:25)

## 2021-07-09 NOTE — Progress Notes (Signed)
MEDICARE ANNUAL WELLNESS VISIT AND FOLLOW UP  Assessment:   Annual Medicare Wellness Visit Due annually  Health maintenance reviewed  Essential hypertension -     Continue hctz 25 mg daily, terazosin 10 mg daily  -     Low BP for age, try lasix/potassium every other day -     CBC with Differential/Platelet -     CMP/GFR -     Magnesium  Chronic bronchitis, unspecified chronic bronchitis type ([place])       -     Stable; never smoker, denies sx  Gastroesophageal reflux disease, esophagitis presence not specified -     Magnesium -     Continue H2blocker/PPI  Hypothyroidism -     Continue levothyroxine 50 mcg daily -     TSH  Osteoporosis, unspecified osteoporosis type, unspecified pathological fracture presence -     VITAMIN D 25 Hydroxy (Vit-D Deficiency, Fractures), calcium supplementation -     Has not tolerated medications -     Encouraged to continue with home exercises/weights -     Declines further DEXA  Vitamin D deficiency -     VITAMIN D 25 Hydroxy (Vit-D Deficiency, Fractures)  Obesity (BMI 30-39.9)      -      Patient is working on home exercises; BMI trending down slowly. Discussed diet, encouraged reduced sugar/white flour products, push vegetables, lean proteins, whole grains.   Depression, major, in remission ([place])      -      Mood stable, however possible benefit with cymbalta for pain; discussed and will stop celexa/start celexa   Hypertensive retinopathy of both eyes      -      Continue close monitoring of BPs; continue annual follow up with Dr. Payton [facility] office  Varicose veins of bilateral lower extremities with other complications        -      Continue with support hose daily  Abnormal glucose Recent A1Cs at goal Discussed diet/exercise, weight management  Defer A1C; check CMP  Recurrent UTI High risk complication Hygiene reviewed Lack of benefit with D - mannose Will do daily prophylaxtic with macrobid after discussion with  family  Medication management Continued   Further disposition pending results if labs check today. Discussed med's effects and SE's.   Over 30 minutes of face to face interview, exam, counseling, chart review, and critical decision making was performed.    Future Appointments  Date Time Provider [place]  08/20/2021 10:40 AM Jaquita Folds, MD [facility]  11/05/2021 11:00 AM Marzetta Board, DPM [facility]  12/03/2021  3:30 PM [name], NP [facility] None     Plan:   During the course of the visit the patient was educated and counseled about appropriate screening and preventive services including:   Pneumococcal vaccine  Prevnar 13 Influenza vaccine Td vaccine Screening electrocardiogram Bone densitometry screening Colorectal cancer screening Diabetes screening Glaucoma screening Nutrition counseling  Advanced directives: requested   Subjective:  Alyssa Schmitt is a 87 y.o. very pleasant [place] female who presents for Medicare Annual Wellness Visit and 3 month follow up on chronic medical conditions.   She is driven by her daughter whom she lives with after her husband passed away. She also has a caregiver that comes from 9-5 to help her with her ADL's. Chronic pain due to moderate kyphoscoliosis, advanced OA of knees. Essentially bed/wheelchair bound. PT is working with her twice a week.   She is currently  on celexa 20 mg and buspar 5 mg BID for mood.   She has had numerous UTIs in the last several years (est 5-6 in last year). She does wear pull ups, family doing excellent close monitoring/changing. Hx of pelvic organ prolapse.   she has a diagnosis of GERD controlled on omeprazole 40 mg daily  BMI is There is no height or weight on file to calculate BMI. (Unable to get on scale to weigh she has been working on diet and exercise (does chair exercises and walks in home with walker). Daughter reports monitoring diet closely, feels  weight is stable at home since May admission.  Wt Readings from Last 3 Encounters:  05/03/21 144 lb 6.4 oz (65.5 kg)  08/19/20 180 lb (81.6 kg)  07/29/20 183 lb (83 kg)   Her blood pressure has been controlled at home, today their BP is BP: 104/64  She does workout (PT twice a week). She denies chest pain, shortness of breath, dizziness.   She is not on cholesterol medication and denies myalgias. Her cholesterol is at goal. The cholesterol last visit was:   Lab Results  Component Value Date   CHOL 192 02/06/2020   HDL 55 02/06/2020   LDLCALC 118 (H) 02/06/2020   TRIG 87 02/06/2020   CHOLHDL 3.5 02/06/2020    She has been working on diet and exercise for glucose management, and denies increased appetite, nausea, paresthesia of the feet, polydipsia and polyuria. Last A1C in the office was:  Lab Results  Component Value Date   HGBA1C 5.4 05/03/2021   She is on thyroid medication. Her medication was not changed last visit. Taking levothyroxine 50 mcg daily, unchanged for several years.  Lab Results  Component Value Date   TSH 2.966 05/03/2021   Patient is on Vitamin D supplement and at goal, 6000 IU daily :  Lab Results  Component Value Date   VD25OH 111 (H) 08/19/2020      Medication Review: Current Outpatient Medications on File Prior to Visit  Medication Sig Dispense Refill   acetaminophen (TYLENOL) 325 MG tablet Take 2 tablets (650 mg total) by mouth every 6 (six) hours as needed (prn TEMP>38.1 Celsius). 30 tablet 2   busPIRone (BUSPAR) 5 MG tablet Take 5 mg by mouth 2 (two) times daily.     Cholecalciferol (VITAMIN D3) 2000 UNITS TABS Take 3 tablets by mouth daily.     fexofenadine (ALLEGRA) 180 MG tablet Take 180 mg by mouth daily.      furosemide (LASIX) 20 MG tablet Take 1 tablet (20 mg total) by mouth daily. 90 tablet 2   hydrochlorothiazide (HYDRODIURIL) 25 MG tablet TAKE 1 TABLET DAILY FOR BP & FLUID 90 tablet 3   levothyroxine (SYNTHROID) 50 MCG tablet TAKE 1 TABLET  DAILY ON AN EMPTY STOMACH WITH ONLY WATER FOR 30 MINUTES & NO ANTACID MEDS, CALCIUM OR MAGNESIUM FOR 4 HOURS & AVOID BIOTIN (Patient taking differently: Take 50 mcg by mouth daily before breakfast.) 90 tablet 3   magnesium oxide (MAG-OX) 400 (241.3 MG) MG tablet Take 1 tablet (400 mg total) by mouth 2 (two) times daily. (Patient taking differently: Take 400 mg by mouth daily.) 60 tablet 2   metoprolol tartrate (LOPRESSOR) 25 MG tablet Take 0.5 tablets (12.5 mg total) by mouth 2 (two) times daily. 120 tablet 2   Multiple Vitamin (MULITIVITAMIN WITH MINERALS) TABS Take 1 tablet by mouth daily.     nystatin (MYCOSTATIN/NYSTOP) powder Apply daily after a shower as needed (Patient  taking differently: Apply 1 application topically daily. Apply daily after a shower .) 60 g 2   omeprazole (PRILOSEC) 40 MG capsule Take  1 capsule  Daily  to Prevent Heartburn & Indigestion 90 capsule 3   potassium chloride (KLOR-CON) 10 MEQ tablet Take 1 tablet (10 mEq total) by mouth daily. 90 tablet 2   Probiotic Product (PROBIOTIC FORMULA PO) Take 1 tablet by mouth daily.      terazosin (HYTRIN) 5 MG capsule Take 5 mg by mouth at bedtime.     zinc gluconate 50 MG tablet Take 50 mg by mouth daily.     calcium-vitamin D (OSCAL WITH D) 250-125 MG-UNIT per tablet Take 1 tablet by mouth daily. (Patient not taking: Reported on 07/09/2021)     No current facility-administered medications on file prior to visit.    Allergies  Allergen Reactions   Aspirin Swelling   Celebrex [Celecoxib] Other (See Comments)    "my body lit up"   Fosamax [Alendronate Sodium] Other (See Comments)    unknown   Keflex [Cephalexin] Other (See Comments)    unknown   Penicillins Other (See Comments)    unknown   Prednisone Itching   Prevacid [Lansoprazole] Other (See Comments)    unknown   Sulfa Antibiotics Other (See Comments)    unknown   Tetanus Toxoids Other (See Comments)    unknown   Tetracyclines & Related Nausea Only          Current Problems (verified) Patient Active Problem List   Diagnosis Date Noted   Frequent UTI 05/02/2021   Prolapse of female pelvic organs 05/02/2021   Generalized weakness 05/02/2021   Hypoxemia 04/12/2021   Environmental allergies 09/26/2020   Combined form of age-related cataract, left eye 09/10/2020   Acquired female bladder prolapse 03/06/2020   Elevated LFTs 08/26/2018   Hypertensive retinopathy of both eyes 09/21/2017   Depression, major, in remission ([place]) 06/22/2016   COPD 08/15/2015   GERD  08/15/2015   Osteoporosis 07/16/2015   Hypothyroidism    Medication management 03/20/2014   Essential hypertension    Hyperlipidemia, mixed    Abnormal glucose    Vitamin D deficiency    Varicose veins of bilateral lower extremities with other complications [id number]    Screening Tests Immunization History  Administered Date(s) Administered   Influenza, High Dose Seasonal PF 09/05/2014, 09/17/2015, 08/25/2016, 09/22/2017, 08/25/2018, 09/25/2019, 09/26/2020   PFIZER(Purple Top)SARS-COV-2 Vaccination 01/19/2020   Pneumococcal Conjugate-13 11/20/2015   Pneumococcal Polysaccharide-23 12/18/2007   Zoster, Live 07/25/2013    Preventative care: Last colonoscopy: 2001 will not get another Last mammogram:09/2019 - family will discuss - defer today Last pap smear/pelvic exam: Remote with Dr. Philis Pique   DEXA: 2016 T -3.1 Intolerant of medications, declines  Echo: 2013  Prior vaccinations:  TD or Tdap: ALLERGY  Influenza: 09/26/2020 Pneumococcal: 2009 Prevnar 13: 2016 Shingles/Zostavax: 2014, check about shingrix Covid 19: 2/2 pfizer - did have the first shot, unsure of date  Names of Other Physician/Practitioners you currently use: 1. [facility] here for primary care 2. Dr. Marshall Cork, eye doctor, last 2022, has left catarct not planning to operate  3. Wears full dentures, last dentist several years ago, denies  issues  Patient Care Team: Unk Pinto, MD as PCP - General (Internal Medicine) Nahser, Wonda Cheng, MD (Cardiology) Unk Pinto, MD as Attending Physician (Internal Medicine) Kelby Aline, PA-C as Physician Assistant (Physician Assistant) Monna Fam, MD as Consulting Physician (Ophthalmology) Mal Misty, MD (Inactive) as  Consulting Physician (Vascular Surgery) Despina Hick, MD as Referring Physician (Radiology) Garald Balding, MD as Consulting Physician (Orthopedic Surgery) Clarene Essex, MD as Consulting Physician (Gastroenterology)  SURGICAL HISTORY She  has a past surgical history that includes Tonsillectomy and Vesicovaginal fistula closure w/ TAH. FAMILY HISTORY Her family history includes Diabetes in her father; Heart disease in her mother; Other in her brother and mother. SOCIAL HISTORY She  reports that she has never smoked. She has never used smokeless tobacco. She reports that she does not drink alcohol and does not use drugs.   MEDICARE WELLNESS OBJECTIVES: Physical activity: Current Exercise Habits: Home exercise routine, Type of exercise: strength training/weights, Time (Minutes): 45, Frequency (Times/Week): 2, Weekly Exercise (Minutes/Week): 90, Intensity: Mild, Exercise limited by: orthopedic condition(s) Cardiac risk factors: Cardiac Risk Factors include: advanced age (>79mn, >>3women);dyslipidemia;hypertension;sedentary lifestyle Depression/mood screen:   [facility]  Decreased Interest 0  Down, Depressed, Hopeless 1  PHQ - 2 Score 1    ADLs:  In your present state of health, do you have any difficulty performing the following activities: 07/09/2021 05/02/2021  Hearing? N N  Vision? N N  Difficulty concentrating or making decisions? N N  Walking or climbing stairs? Y Y  Comment has bed/wheelchair -  Dressing or bathing? YTempie Donning Comment has support from family, has home help -  Doing errands, shopping? YTempie Donning Comment  family drives -  PConservation officer, natureand eating ? Y -  Using the Toilet? Y -  In the past six months, have you accidently leaked urine? Y -  Do you have problems with loss of bowel control? N -  Managing your Medications? N -  Comment - -  Managing your Finances? Y -  Comment family assists -  Housekeeping or managing your Housekeeping? Y -  Comment family manages -  Some recent data might be hidden     Cognitive Testing  Alert? Yes  Normal Appearance?Yes  Oriented to person? Yes  Place? Yes   Time? Yes  Recall of three objects?  Yes  Can perform simple calculations? Yes  Displays appropriate judgment?Yes  Can read the correct time from a watch face?Yes  EOL planning: Does Patient Have a Medical Advance Directive?: Yes Type of Advance Directive: Healthcare Power of Attorney, Living will Does patient want to make changes to medical advance directive?: No - Patient declined [place] of [place]?: Yes - validated most recent copy scanned in chart (See row information)  Review of Systems  Constitutional: Negative.  Negative for malaise/fatigue.  HENT:  Negative for congestion, hearing loss and sore throat.   Eyes:  Negative for blurred vision.  Respiratory:  Negative for cough, sputum production, shortness of breath and wheezing.   Cardiovascular:  Negative for chest pain, palpitations, orthopnea, claudication and leg swelling.  Gastrointestinal:  Negative for abdominal pain, blood in stool, constipation, diarrhea, heartburn, melena, nausea and vomiting.  Genitourinary:  Positive for dysuria. Negative for frequency.  Musculoskeletal:  Positive for back pain and joint pain. Negative for falls and myalgias.  Skin: Negative.   Neurological:  Negative for dizziness, tingling, tremors, sensory change, focal weakness, weakness and headaches.  Endo/Heme/Allergies: Negative.  Negative for polydipsia. Does not bruise/bleed easily.  Psychiatric/Behavioral: Negative.   Negative for depression, memory loss, substance abuse and suicidal ideas. The patient is not nervous/anxious and does not have insomnia.     Objective:     Today's Vitals   07/09/21  1606  BP: 104/64  Pulse: 75  Temp: (!) 97.3 F (36.3 C)  SpO2: 97%   There is no height or weight on file to calculate BMI. General Appearance: Well nourished elder female in no apparent distress. Eyes: PERRLA, EOMs, conjunctiva no swelling or erythema Sinuses: No Frontal/maxillary tenderness ENT/Mouth: Ext aud canals clear, TMs without erythema, bulging. No erythema, swelling, or exudate on post pharynx.  Tonsils not swollen or erythematous. Hearing normal.  Neck: Supple, thyroid normal.  Respiratory: Respiratory effort normal, BS equal bilaterally without rales, rhonchi, wheezing or stridor.  Cardio: RRR with no MRGs. Brisk peripheral pulses without edema. Varicose veins, brown discoloration bil ankles.  Abdomen: Soft, + BS.  Non tender, no guarding, rebound, hernias, masses. Lymphatics: Non tender without lymphadenopathy.  Musculoskeletal: Kyphoscoliosis, bony enlargement knees without effusion, wheelchair bound.  Skin: Warm, dry without rashes, notable lesions, ecchymoses Neuro: Cranial nerves intact. No cerebellar symptoms.  Psych: Awake and oriented X 3, normal affect, Insight and Judgment appropriate.   Medicare Attestation I have personally reviewed: The patient's medical and social history Their use of alcohol, tobacco or illicit drugs Their current medications and supplements The patient's functional ability including ADLs,fall risks, home safety risks, cognitive, and hearing and visual impairment Diet and physical activities Evidence for depression or mood disorders  The patient's weight, height, BMI, and visual acuity have been recorded in the chart.  I have made referrals, counseling, and provided education to the patient based on review of the above and I have provided the patient with a  written personalized care plan for preventive services.     Izora Ribas, NP   07/09/2021

## 2021-07-10 ENCOUNTER — Other Ambulatory Visit: Payer: Self-pay | Admitting: Adult Health

## 2021-07-10 DIAGNOSIS — G4700 Insomnia, unspecified: Secondary | ICD-10-CM | POA: Diagnosis not present

## 2021-07-10 DIAGNOSIS — I959 Hypotension, unspecified: Secondary | ICD-10-CM | POA: Diagnosis not present

## 2021-07-10 DIAGNOSIS — K219 Gastro-esophageal reflux disease without esophagitis: Secondary | ICD-10-CM | POA: Diagnosis not present

## 2021-07-10 DIAGNOSIS — Z993 Dependence on wheelchair: Secondary | ICD-10-CM | POA: Diagnosis not present

## 2021-07-10 DIAGNOSIS — I350 Nonrheumatic aortic (valve) stenosis: Secondary | ICD-10-CM | POA: Diagnosis not present

## 2021-07-10 DIAGNOSIS — N8110 Cystocele, unspecified: Secondary | ICD-10-CM | POA: Diagnosis not present

## 2021-07-10 DIAGNOSIS — I872 Venous insufficiency (chronic) (peripheral): Secondary | ICD-10-CM | POA: Diagnosis not present

## 2021-07-10 DIAGNOSIS — J449 Chronic obstructive pulmonary disease, unspecified: Secondary | ICD-10-CM | POA: Diagnosis not present

## 2021-07-10 DIAGNOSIS — Z9181 History of falling: Secondary | ICD-10-CM | POA: Diagnosis not present

## 2021-07-10 DIAGNOSIS — N390 Urinary tract infection, site not specified: Secondary | ICD-10-CM | POA: Diagnosis not present

## 2021-07-10 DIAGNOSIS — I5033 Acute on chronic diastolic (congestive) heart failure: Secondary | ICD-10-CM | POA: Diagnosis not present

## 2021-07-10 DIAGNOSIS — E039 Hypothyroidism, unspecified: Secondary | ICD-10-CM | POA: Diagnosis not present

## 2021-07-10 DIAGNOSIS — M810 Age-related osteoporosis without current pathological fracture: Secondary | ICD-10-CM | POA: Diagnosis not present

## 2021-07-10 DIAGNOSIS — I110 Hypertensive heart disease with heart failure: Secondary | ICD-10-CM | POA: Diagnosis not present

## 2021-07-10 LAB — COMPLETE METABOLIC PANEL WITH GFR
AG Ratio: 1.2 (calc) (ref 1.0–2.5)
ALT: 17 U/L (ref 6–29)
AST: 28 U/L (ref 10–35)
Albumin: 4 g/dL (ref 3.6–5.1)
Alkaline phosphatase (APISO): 59 U/L (ref 37–153)
BUN: 22 mg/dL (ref 7–25)
CO2: 32 mmol/L (ref 20–32)
Calcium: 10.4 mg/dL (ref 8.6–10.4)
Chloride: 95 mmol/L — ABNORMAL LOW (ref 98–110)
Creat: 0.7 mg/dL (ref 0.60–0.95)
Globulin: 3.3 g/dL (calc) (ref 1.9–3.7)
Glucose, Bld: 122 mg/dL — ABNORMAL HIGH (ref 65–99)
Potassium: 3.6 mmol/L (ref 3.5–5.3)
Sodium: 138 mmol/L (ref 135–146)
Total Bilirubin: 0.5 mg/dL (ref 0.2–1.2)
Total Protein: 7.3 g/dL (ref 6.1–8.1)
eGFR: 82 mL/min/{1.73_m2} (ref >=60)

## 2021-07-10 LAB — VITAMIN D 25 HYDROXY (VIT D DEFICIENCY, FRACTURES): Vit D, 25-Hydroxy: 112 ng/mL — ABNORMAL HIGH (ref 30–100)

## 2021-07-10 LAB — CBC WITH DIFFERENTIAL/PLATELET
Absolute Monocytes: 572 cells/uL (ref 200–950)
Basophils Absolute: 39 cells/uL (ref 0–200)
Basophils Relative: 0.6 %
Eosinophils Absolute: 72 cells/uL (ref 15–500)
Eosinophils Relative: 1.1 %
HCT: 45.2 % — ABNORMAL HIGH (ref 35.0–45.0)
Hemoglobin: 15.3 g/dL (ref 11.7–15.5)
Lymphs Abs: 1976 cells/uL (ref 850–3900)
MCH: 30.7 pg (ref 27.0–33.0)
MCHC: 33.8 g/dL (ref 32.0–36.0)
MCV: 90.6 fL (ref 80.0–100.0)
MPV: 10.9 fL (ref 7.5–12.5)
Monocytes Relative: 8.8 %
Neutro Abs: 3842 cells/uL (ref 1500–7800)
Neutrophils Relative %: 59.1 %
Platelets: 210 10*3/uL (ref 140–400)
RBC: 4.99 10*6/uL (ref 3.80–5.10)
RDW: 12.8 % (ref 11.0–15.0)
Total Lymphocyte: 30.4 %
WBC: 6.5 10*3/uL (ref 3.8–10.8)

## 2021-07-10 LAB — TSH: TSH: 3.15 mIU/L (ref 0.40–4.50)

## 2021-07-10 LAB — MAGNESIUM: Magnesium: 2 mg/dL (ref 1.5–2.5)

## 2021-07-10 MED ORDER — VITAMIN D3 50 MCG (2000 UT) PO TABS: 2.0000 | ORAL_TABLET | Freq: Every day | ORAL | Status: AC

## 2021-07-14 DIAGNOSIS — I5033 Acute on chronic diastolic (congestive) heart failure: Secondary | ICD-10-CM | POA: Diagnosis not present

## 2021-07-14 DIAGNOSIS — M810 Age-related osteoporosis without current pathological fracture: Secondary | ICD-10-CM | POA: Diagnosis not present

## 2021-07-14 DIAGNOSIS — K219 Gastro-esophageal reflux disease without esophagitis: Secondary | ICD-10-CM | POA: Diagnosis not present

## 2021-07-14 DIAGNOSIS — J449 Chronic obstructive pulmonary disease, unspecified: Secondary | ICD-10-CM | POA: Diagnosis not present

## 2021-07-14 DIAGNOSIS — E039 Hypothyroidism, unspecified: Secondary | ICD-10-CM | POA: Diagnosis not present

## 2021-07-14 DIAGNOSIS — I350 Nonrheumatic aortic (valve) stenosis: Secondary | ICD-10-CM | POA: Diagnosis not present

## 2021-07-14 DIAGNOSIS — I110 Hypertensive heart disease with heart failure: Secondary | ICD-10-CM | POA: Diagnosis not present

## 2021-07-14 DIAGNOSIS — Z9181 History of falling: Secondary | ICD-10-CM | POA: Diagnosis not present

## 2021-07-14 DIAGNOSIS — Z993 Dependence on wheelchair: Secondary | ICD-10-CM | POA: Diagnosis not present

## 2021-07-14 DIAGNOSIS — I872 Venous insufficiency (chronic) (peripheral): Secondary | ICD-10-CM | POA: Diagnosis not present

## 2021-07-14 DIAGNOSIS — N390 Urinary tract infection, site not specified: Secondary | ICD-10-CM | POA: Diagnosis not present

## 2021-07-14 DIAGNOSIS — N8110 Cystocele, unspecified: Secondary | ICD-10-CM | POA: Diagnosis not present

## 2021-07-14 DIAGNOSIS — I959 Hypotension, unspecified: Secondary | ICD-10-CM | POA: Diagnosis not present

## 2021-07-14 DIAGNOSIS — G4700 Insomnia, unspecified: Secondary | ICD-10-CM | POA: Diagnosis not present

## 2021-07-16 DIAGNOSIS — I110 Hypertensive heart disease with heart failure: Secondary | ICD-10-CM | POA: Diagnosis not present

## 2021-07-16 DIAGNOSIS — G4700 Insomnia, unspecified: Secondary | ICD-10-CM | POA: Diagnosis not present

## 2021-07-16 DIAGNOSIS — N8110 Cystocele, unspecified: Secondary | ICD-10-CM | POA: Diagnosis not present

## 2021-07-16 DIAGNOSIS — I350 Nonrheumatic aortic (valve) stenosis: Secondary | ICD-10-CM | POA: Diagnosis not present

## 2021-07-16 DIAGNOSIS — E039 Hypothyroidism, unspecified: Secondary | ICD-10-CM | POA: Diagnosis not present

## 2021-07-16 DIAGNOSIS — Z9181 History of falling: Secondary | ICD-10-CM | POA: Diagnosis not present

## 2021-07-16 DIAGNOSIS — I5033 Acute on chronic diastolic (congestive) heart failure: Secondary | ICD-10-CM | POA: Diagnosis not present

## 2021-07-16 DIAGNOSIS — I872 Venous insufficiency (chronic) (peripheral): Secondary | ICD-10-CM | POA: Diagnosis not present

## 2021-07-16 DIAGNOSIS — N390 Urinary tract infection, site not specified: Secondary | ICD-10-CM | POA: Diagnosis not present

## 2021-07-16 DIAGNOSIS — K219 Gastro-esophageal reflux disease without esophagitis: Secondary | ICD-10-CM | POA: Diagnosis not present

## 2021-07-16 DIAGNOSIS — M810 Age-related osteoporosis without current pathological fracture: Secondary | ICD-10-CM | POA: Diagnosis not present

## 2021-07-16 DIAGNOSIS — I959 Hypotension, unspecified: Secondary | ICD-10-CM | POA: Diagnosis not present

## 2021-07-16 DIAGNOSIS — J449 Chronic obstructive pulmonary disease, unspecified: Secondary | ICD-10-CM | POA: Diagnosis not present

## 2021-07-16 DIAGNOSIS — Z993 Dependence on wheelchair: Secondary | ICD-10-CM | POA: Diagnosis not present

## 2021-07-21 DIAGNOSIS — G4700 Insomnia, unspecified: Secondary | ICD-10-CM | POA: Diagnosis not present

## 2021-07-21 DIAGNOSIS — N8110 Cystocele, unspecified: Secondary | ICD-10-CM | POA: Diagnosis not present

## 2021-07-21 DIAGNOSIS — I5033 Acute on chronic diastolic (congestive) heart failure: Secondary | ICD-10-CM | POA: Diagnosis not present

## 2021-07-21 DIAGNOSIS — N390 Urinary tract infection, site not specified: Secondary | ICD-10-CM | POA: Diagnosis not present

## 2021-07-21 DIAGNOSIS — I959 Hypotension, unspecified: Secondary | ICD-10-CM | POA: Diagnosis not present

## 2021-07-21 DIAGNOSIS — Z9181 History of falling: Secondary | ICD-10-CM | POA: Diagnosis not present

## 2021-07-21 DIAGNOSIS — E039 Hypothyroidism, unspecified: Secondary | ICD-10-CM | POA: Diagnosis not present

## 2021-07-21 DIAGNOSIS — I350 Nonrheumatic aortic (valve) stenosis: Secondary | ICD-10-CM | POA: Diagnosis not present

## 2021-07-21 DIAGNOSIS — Z993 Dependence on wheelchair: Secondary | ICD-10-CM | POA: Diagnosis not present

## 2021-07-21 DIAGNOSIS — J449 Chronic obstructive pulmonary disease, unspecified: Secondary | ICD-10-CM | POA: Diagnosis not present

## 2021-07-21 DIAGNOSIS — I872 Venous insufficiency (chronic) (peripheral): Secondary | ICD-10-CM | POA: Diagnosis not present

## 2021-07-21 DIAGNOSIS — I110 Hypertensive heart disease with heart failure: Secondary | ICD-10-CM | POA: Diagnosis not present

## 2021-07-21 DIAGNOSIS — M810 Age-related osteoporosis without current pathological fracture: Secondary | ICD-10-CM | POA: Diagnosis not present

## 2021-07-21 DIAGNOSIS — K219 Gastro-esophageal reflux disease without esophagitis: Secondary | ICD-10-CM | POA: Diagnosis not present

## 2021-07-23 DIAGNOSIS — I959 Hypotension, unspecified: Secondary | ICD-10-CM | POA: Diagnosis not present

## 2021-07-23 DIAGNOSIS — M810 Age-related osteoporosis without current pathological fracture: Secondary | ICD-10-CM | POA: Diagnosis not present

## 2021-07-23 DIAGNOSIS — E039 Hypothyroidism, unspecified: Secondary | ICD-10-CM | POA: Diagnosis not present

## 2021-07-23 DIAGNOSIS — I110 Hypertensive heart disease with heart failure: Secondary | ICD-10-CM | POA: Diagnosis not present

## 2021-07-23 DIAGNOSIS — Z993 Dependence on wheelchair: Secondary | ICD-10-CM | POA: Diagnosis not present

## 2021-07-23 DIAGNOSIS — I5033 Acute on chronic diastolic (congestive) heart failure: Secondary | ICD-10-CM | POA: Diagnosis not present

## 2021-07-23 DIAGNOSIS — K219 Gastro-esophageal reflux disease without esophagitis: Secondary | ICD-10-CM | POA: Diagnosis not present

## 2021-07-23 DIAGNOSIS — Z9181 History of falling: Secondary | ICD-10-CM | POA: Diagnosis not present

## 2021-07-23 DIAGNOSIS — N8110 Cystocele, unspecified: Secondary | ICD-10-CM | POA: Diagnosis not present

## 2021-07-23 DIAGNOSIS — N390 Urinary tract infection, site not specified: Secondary | ICD-10-CM | POA: Diagnosis not present

## 2021-07-23 DIAGNOSIS — I350 Nonrheumatic aortic (valve) stenosis: Secondary | ICD-10-CM | POA: Diagnosis not present

## 2021-07-23 DIAGNOSIS — G4700 Insomnia, unspecified: Secondary | ICD-10-CM | POA: Diagnosis not present

## 2021-07-23 DIAGNOSIS — I872 Venous insufficiency (chronic) (peripheral): Secondary | ICD-10-CM | POA: Diagnosis not present

## 2021-07-23 DIAGNOSIS — J449 Chronic obstructive pulmonary disease, unspecified: Secondary | ICD-10-CM | POA: Diagnosis not present

## 2021-07-24 DIAGNOSIS — I350 Nonrheumatic aortic (valve) stenosis: Secondary | ICD-10-CM | POA: Diagnosis not present

## 2021-07-24 DIAGNOSIS — N390 Urinary tract infection, site not specified: Secondary | ICD-10-CM | POA: Diagnosis not present

## 2021-07-24 DIAGNOSIS — I110 Hypertensive heart disease with heart failure: Secondary | ICD-10-CM | POA: Diagnosis not present

## 2021-07-24 DIAGNOSIS — N8110 Cystocele, unspecified: Secondary | ICD-10-CM | POA: Diagnosis not present

## 2021-07-24 DIAGNOSIS — I959 Hypotension, unspecified: Secondary | ICD-10-CM | POA: Diagnosis not present

## 2021-07-24 DIAGNOSIS — E039 Hypothyroidism, unspecified: Secondary | ICD-10-CM | POA: Diagnosis not present

## 2021-07-24 DIAGNOSIS — J449 Chronic obstructive pulmonary disease, unspecified: Secondary | ICD-10-CM | POA: Diagnosis not present

## 2021-07-24 DIAGNOSIS — Z9181 History of falling: Secondary | ICD-10-CM | POA: Diagnosis not present

## 2021-07-24 DIAGNOSIS — K219 Gastro-esophageal reflux disease without esophagitis: Secondary | ICD-10-CM | POA: Diagnosis not present

## 2021-07-24 DIAGNOSIS — Z993 Dependence on wheelchair: Secondary | ICD-10-CM | POA: Diagnosis not present

## 2021-07-24 DIAGNOSIS — G4700 Insomnia, unspecified: Secondary | ICD-10-CM | POA: Diagnosis not present

## 2021-07-24 DIAGNOSIS — I5033 Acute on chronic diastolic (congestive) heart failure: Secondary | ICD-10-CM | POA: Diagnosis not present

## 2021-07-24 DIAGNOSIS — I872 Venous insufficiency (chronic) (peripheral): Secondary | ICD-10-CM | POA: Diagnosis not present

## 2021-07-24 DIAGNOSIS — M810 Age-related osteoporosis without current pathological fracture: Secondary | ICD-10-CM | POA: Diagnosis not present

## 2021-07-28 DIAGNOSIS — Z9181 History of falling: Secondary | ICD-10-CM | POA: Diagnosis not present

## 2021-07-28 DIAGNOSIS — E039 Hypothyroidism, unspecified: Secondary | ICD-10-CM | POA: Diagnosis not present

## 2021-07-28 DIAGNOSIS — G4700 Insomnia, unspecified: Secondary | ICD-10-CM | POA: Diagnosis not present

## 2021-07-28 DIAGNOSIS — N8110 Cystocele, unspecified: Secondary | ICD-10-CM | POA: Diagnosis not present

## 2021-07-28 DIAGNOSIS — I872 Venous insufficiency (chronic) (peripheral): Secondary | ICD-10-CM | POA: Diagnosis not present

## 2021-07-28 DIAGNOSIS — N390 Urinary tract infection, site not specified: Secondary | ICD-10-CM | POA: Diagnosis not present

## 2021-07-28 DIAGNOSIS — Z993 Dependence on wheelchair: Secondary | ICD-10-CM | POA: Diagnosis not present

## 2021-07-28 DIAGNOSIS — I959 Hypotension, unspecified: Secondary | ICD-10-CM | POA: Diagnosis not present

## 2021-07-28 DIAGNOSIS — I350 Nonrheumatic aortic (valve) stenosis: Secondary | ICD-10-CM | POA: Diagnosis not present

## 2021-07-28 DIAGNOSIS — I110 Hypertensive heart disease with heart failure: Secondary | ICD-10-CM | POA: Diagnosis not present

## 2021-07-28 DIAGNOSIS — J449 Chronic obstructive pulmonary disease, unspecified: Secondary | ICD-10-CM | POA: Diagnosis not present

## 2021-07-28 DIAGNOSIS — I5033 Acute on chronic diastolic (congestive) heart failure: Secondary | ICD-10-CM | POA: Diagnosis not present

## 2021-07-28 DIAGNOSIS — M810 Age-related osteoporosis without current pathological fracture: Secondary | ICD-10-CM | POA: Diagnosis not present

## 2021-07-28 DIAGNOSIS — K219 Gastro-esophageal reflux disease without esophagitis: Secondary | ICD-10-CM | POA: Diagnosis not present

## 2021-07-29 DIAGNOSIS — I350 Nonrheumatic aortic (valve) stenosis: Secondary | ICD-10-CM | POA: Diagnosis not present

## 2021-07-29 DIAGNOSIS — N390 Urinary tract infection, site not specified: Secondary | ICD-10-CM | POA: Diagnosis not present

## 2021-07-29 DIAGNOSIS — I872 Venous insufficiency (chronic) (peripheral): Secondary | ICD-10-CM | POA: Diagnosis not present

## 2021-07-29 DIAGNOSIS — G4700 Insomnia, unspecified: Secondary | ICD-10-CM | POA: Diagnosis not present

## 2021-07-29 DIAGNOSIS — I5033 Acute on chronic diastolic (congestive) heart failure: Secondary | ICD-10-CM | POA: Diagnosis not present

## 2021-07-29 DIAGNOSIS — Z993 Dependence on wheelchair: Secondary | ICD-10-CM | POA: Diagnosis not present

## 2021-07-29 DIAGNOSIS — K219 Gastro-esophageal reflux disease without esophagitis: Secondary | ICD-10-CM | POA: Diagnosis not present

## 2021-07-29 DIAGNOSIS — M810 Age-related osteoporosis without current pathological fracture: Secondary | ICD-10-CM | POA: Diagnosis not present

## 2021-07-29 DIAGNOSIS — I959 Hypotension, unspecified: Secondary | ICD-10-CM | POA: Diagnosis not present

## 2021-07-29 DIAGNOSIS — N8110 Cystocele, unspecified: Secondary | ICD-10-CM | POA: Diagnosis not present

## 2021-07-29 DIAGNOSIS — J449 Chronic obstructive pulmonary disease, unspecified: Secondary | ICD-10-CM | POA: Diagnosis not present

## 2021-07-29 DIAGNOSIS — Z9181 History of falling: Secondary | ICD-10-CM | POA: Diagnosis not present

## 2021-07-29 DIAGNOSIS — E039 Hypothyroidism, unspecified: Secondary | ICD-10-CM | POA: Diagnosis not present

## 2021-07-29 DIAGNOSIS — I110 Hypertensive heart disease with heart failure: Secondary | ICD-10-CM | POA: Diagnosis not present

## 2021-07-31 DIAGNOSIS — I5033 Acute on chronic diastolic (congestive) heart failure: Secondary | ICD-10-CM | POA: Diagnosis not present

## 2021-08-05 DIAGNOSIS — I872 Venous insufficiency (chronic) (peripheral): Secondary | ICD-10-CM | POA: Diagnosis not present

## 2021-08-05 DIAGNOSIS — N390 Urinary tract infection, site not specified: Secondary | ICD-10-CM | POA: Diagnosis not present

## 2021-08-05 DIAGNOSIS — K219 Gastro-esophageal reflux disease without esophagitis: Secondary | ICD-10-CM | POA: Diagnosis not present

## 2021-08-05 DIAGNOSIS — I5033 Acute on chronic diastolic (congestive) heart failure: Secondary | ICD-10-CM | POA: Diagnosis not present

## 2021-08-05 DIAGNOSIS — M810 Age-related osteoporosis without current pathological fracture: Secondary | ICD-10-CM | POA: Diagnosis not present

## 2021-08-05 DIAGNOSIS — E039 Hypothyroidism, unspecified: Secondary | ICD-10-CM | POA: Diagnosis not present

## 2021-08-05 DIAGNOSIS — Z9181 History of falling: Secondary | ICD-10-CM | POA: Diagnosis not present

## 2021-08-05 DIAGNOSIS — G4700 Insomnia, unspecified: Secondary | ICD-10-CM | POA: Diagnosis not present

## 2021-08-05 DIAGNOSIS — Z993 Dependence on wheelchair: Secondary | ICD-10-CM | POA: Diagnosis not present

## 2021-08-05 DIAGNOSIS — I110 Hypertensive heart disease with heart failure: Secondary | ICD-10-CM | POA: Diagnosis not present

## 2021-08-05 DIAGNOSIS — N8110 Cystocele, unspecified: Secondary | ICD-10-CM | POA: Diagnosis not present

## 2021-08-05 DIAGNOSIS — I959 Hypotension, unspecified: Secondary | ICD-10-CM | POA: Diagnosis not present

## 2021-08-05 DIAGNOSIS — J449 Chronic obstructive pulmonary disease, unspecified: Secondary | ICD-10-CM | POA: Diagnosis not present

## 2021-08-05 DIAGNOSIS — I350 Nonrheumatic aortic (valve) stenosis: Secondary | ICD-10-CM | POA: Diagnosis not present

## 2021-08-07 DIAGNOSIS — M810 Age-related osteoporosis without current pathological fracture: Secondary | ICD-10-CM | POA: Diagnosis not present

## 2021-08-07 DIAGNOSIS — I5033 Acute on chronic diastolic (congestive) heart failure: Secondary | ICD-10-CM | POA: Diagnosis not present

## 2021-08-07 DIAGNOSIS — I872 Venous insufficiency (chronic) (peripheral): Secondary | ICD-10-CM | POA: Diagnosis not present

## 2021-08-07 DIAGNOSIS — N8110 Cystocele, unspecified: Secondary | ICD-10-CM | POA: Diagnosis not present

## 2021-08-07 DIAGNOSIS — G4700 Insomnia, unspecified: Secondary | ICD-10-CM | POA: Diagnosis not present

## 2021-08-07 DIAGNOSIS — Z9181 History of falling: Secondary | ICD-10-CM | POA: Diagnosis not present

## 2021-08-07 DIAGNOSIS — I959 Hypotension, unspecified: Secondary | ICD-10-CM | POA: Diagnosis not present

## 2021-08-07 DIAGNOSIS — J449 Chronic obstructive pulmonary disease, unspecified: Secondary | ICD-10-CM | POA: Diagnosis not present

## 2021-08-07 DIAGNOSIS — Z993 Dependence on wheelchair: Secondary | ICD-10-CM | POA: Diagnosis not present

## 2021-08-07 DIAGNOSIS — K219 Gastro-esophageal reflux disease without esophagitis: Secondary | ICD-10-CM | POA: Diagnosis not present

## 2021-08-07 DIAGNOSIS — I350 Nonrheumatic aortic (valve) stenosis: Secondary | ICD-10-CM | POA: Diagnosis not present

## 2021-08-07 DIAGNOSIS — N390 Urinary tract infection, site not specified: Secondary | ICD-10-CM | POA: Diagnosis not present

## 2021-08-07 DIAGNOSIS — I110 Hypertensive heart disease with heart failure: Secondary | ICD-10-CM | POA: Diagnosis not present

## 2021-08-07 DIAGNOSIS — E039 Hypothyroidism, unspecified: Secondary | ICD-10-CM | POA: Diagnosis not present

## 2021-08-09 DIAGNOSIS — I5033 Acute on chronic diastolic (congestive) heart failure: Secondary | ICD-10-CM | POA: Diagnosis not present

## 2021-08-12 DIAGNOSIS — I5033 Acute on chronic diastolic (congestive) heart failure: Secondary | ICD-10-CM | POA: Diagnosis not present

## 2021-08-12 DIAGNOSIS — G4700 Insomnia, unspecified: Secondary | ICD-10-CM | POA: Diagnosis not present

## 2021-08-12 DIAGNOSIS — I872 Venous insufficiency (chronic) (peripheral): Secondary | ICD-10-CM | POA: Diagnosis not present

## 2021-08-12 DIAGNOSIS — E039 Hypothyroidism, unspecified: Secondary | ICD-10-CM | POA: Diagnosis not present

## 2021-08-12 DIAGNOSIS — K219 Gastro-esophageal reflux disease without esophagitis: Secondary | ICD-10-CM | POA: Diagnosis not present

## 2021-08-12 DIAGNOSIS — N8110 Cystocele, unspecified: Secondary | ICD-10-CM | POA: Diagnosis not present

## 2021-08-12 DIAGNOSIS — Z993 Dependence on wheelchair: Secondary | ICD-10-CM | POA: Diagnosis not present

## 2021-08-12 DIAGNOSIS — N390 Urinary tract infection, site not specified: Secondary | ICD-10-CM | POA: Diagnosis not present

## 2021-08-12 DIAGNOSIS — J449 Chronic obstructive pulmonary disease, unspecified: Secondary | ICD-10-CM | POA: Diagnosis not present

## 2021-08-12 DIAGNOSIS — I959 Hypotension, unspecified: Secondary | ICD-10-CM | POA: Diagnosis not present

## 2021-08-12 DIAGNOSIS — I350 Nonrheumatic aortic (valve) stenosis: Secondary | ICD-10-CM | POA: Diagnosis not present

## 2021-08-12 DIAGNOSIS — I110 Hypertensive heart disease with heart failure: Secondary | ICD-10-CM | POA: Diagnosis not present

## 2021-08-12 DIAGNOSIS — Z9181 History of falling: Secondary | ICD-10-CM | POA: Diagnosis not present

## 2021-08-12 DIAGNOSIS — M810 Age-related osteoporosis without current pathological fracture: Secondary | ICD-10-CM | POA: Diagnosis not present

## 2021-08-14 DIAGNOSIS — Z9181 History of falling: Secondary | ICD-10-CM | POA: Diagnosis not present

## 2021-08-14 DIAGNOSIS — I959 Hypotension, unspecified: Secondary | ICD-10-CM | POA: Diagnosis not present

## 2021-08-14 DIAGNOSIS — I350 Nonrheumatic aortic (valve) stenosis: Secondary | ICD-10-CM | POA: Diagnosis not present

## 2021-08-14 DIAGNOSIS — I110 Hypertensive heart disease with heart failure: Secondary | ICD-10-CM | POA: Diagnosis not present

## 2021-08-14 DIAGNOSIS — J449 Chronic obstructive pulmonary disease, unspecified: Secondary | ICD-10-CM | POA: Diagnosis not present

## 2021-08-14 DIAGNOSIS — I872 Venous insufficiency (chronic) (peripheral): Secondary | ICD-10-CM | POA: Diagnosis not present

## 2021-08-14 DIAGNOSIS — E039 Hypothyroidism, unspecified: Secondary | ICD-10-CM | POA: Diagnosis not present

## 2021-08-14 DIAGNOSIS — M810 Age-related osteoporosis without current pathological fracture: Secondary | ICD-10-CM | POA: Diagnosis not present

## 2021-08-14 DIAGNOSIS — N8110 Cystocele, unspecified: Secondary | ICD-10-CM | POA: Diagnosis not present

## 2021-08-14 DIAGNOSIS — Z993 Dependence on wheelchair: Secondary | ICD-10-CM | POA: Diagnosis not present

## 2021-08-14 DIAGNOSIS — I5033 Acute on chronic diastolic (congestive) heart failure: Secondary | ICD-10-CM | POA: Diagnosis not present

## 2021-08-14 DIAGNOSIS — G4700 Insomnia, unspecified: Secondary | ICD-10-CM | POA: Diagnosis not present

## 2021-08-14 DIAGNOSIS — N390 Urinary tract infection, site not specified: Secondary | ICD-10-CM | POA: Diagnosis not present

## 2021-08-14 DIAGNOSIS — K219 Gastro-esophageal reflux disease without esophagitis: Secondary | ICD-10-CM | POA: Diagnosis not present

## 2021-08-18 DIAGNOSIS — N8110 Cystocele, unspecified: Secondary | ICD-10-CM | POA: Diagnosis not present

## 2021-08-18 DIAGNOSIS — M810 Age-related osteoporosis without current pathological fracture: Secondary | ICD-10-CM | POA: Diagnosis not present

## 2021-08-18 DIAGNOSIS — I350 Nonrheumatic aortic (valve) stenosis: Secondary | ICD-10-CM | POA: Diagnosis not present

## 2021-08-18 DIAGNOSIS — E039 Hypothyroidism, unspecified: Secondary | ICD-10-CM | POA: Diagnosis not present

## 2021-08-18 DIAGNOSIS — I110 Hypertensive heart disease with heart failure: Secondary | ICD-10-CM | POA: Diagnosis not present

## 2021-08-18 DIAGNOSIS — I5033 Acute on chronic diastolic (congestive) heart failure: Secondary | ICD-10-CM | POA: Diagnosis not present

## 2021-08-18 DIAGNOSIS — G4700 Insomnia, unspecified: Secondary | ICD-10-CM | POA: Diagnosis not present

## 2021-08-18 DIAGNOSIS — K219 Gastro-esophageal reflux disease without esophagitis: Secondary | ICD-10-CM | POA: Diagnosis not present

## 2021-08-18 DIAGNOSIS — Z993 Dependence on wheelchair: Secondary | ICD-10-CM | POA: Diagnosis not present

## 2021-08-18 DIAGNOSIS — N390 Urinary tract infection, site not specified: Secondary | ICD-10-CM | POA: Diagnosis not present

## 2021-08-18 DIAGNOSIS — Z9181 History of falling: Secondary | ICD-10-CM | POA: Diagnosis not present

## 2021-08-18 DIAGNOSIS — I872 Venous insufficiency (chronic) (peripheral): Secondary | ICD-10-CM | POA: Diagnosis not present

## 2021-08-18 DIAGNOSIS — I959 Hypotension, unspecified: Secondary | ICD-10-CM | POA: Diagnosis not present

## 2021-08-18 DIAGNOSIS — J449 Chronic obstructive pulmonary disease, unspecified: Secondary | ICD-10-CM | POA: Diagnosis not present

## 2021-08-19 ENCOUNTER — Telehealth: Payer: Self-pay

## 2021-08-19 NOTE — Telephone Encounter (Signed)
Attempt made to contact Alyssa Schmitt is a 87 y.o. female re: New pt Pre appt call to collect history information.  -Allergy -Medication -Confirm pharmacy -OB history   Pt was not available. No msg left on the VM. Different name on the VM.

## 2021-08-20 ENCOUNTER — Ambulatory Visit: Payer: Medicare Other | Admitting: Obstetrics and Gynecology

## 2021-08-20 DIAGNOSIS — G4700 Insomnia, unspecified: Secondary | ICD-10-CM | POA: Diagnosis not present

## 2021-08-20 DIAGNOSIS — N8110 Cystocele, unspecified: Secondary | ICD-10-CM | POA: Diagnosis not present

## 2021-08-20 DIAGNOSIS — J449 Chronic obstructive pulmonary disease, unspecified: Secondary | ICD-10-CM | POA: Diagnosis not present

## 2021-08-20 DIAGNOSIS — Z993 Dependence on wheelchair: Secondary | ICD-10-CM | POA: Diagnosis not present

## 2021-08-20 DIAGNOSIS — I959 Hypotension, unspecified: Secondary | ICD-10-CM | POA: Diagnosis not present

## 2021-08-20 DIAGNOSIS — M810 Age-related osteoporosis without current pathological fracture: Secondary | ICD-10-CM | POA: Diagnosis not present

## 2021-08-20 DIAGNOSIS — E039 Hypothyroidism, unspecified: Secondary | ICD-10-CM | POA: Diagnosis not present

## 2021-08-20 DIAGNOSIS — I350 Nonrheumatic aortic (valve) stenosis: Secondary | ICD-10-CM | POA: Diagnosis not present

## 2021-08-20 DIAGNOSIS — Z9181 History of falling: Secondary | ICD-10-CM | POA: Diagnosis not present

## 2021-08-20 DIAGNOSIS — K219 Gastro-esophageal reflux disease without esophagitis: Secondary | ICD-10-CM | POA: Diagnosis not present

## 2021-08-20 DIAGNOSIS — I5033 Acute on chronic diastolic (congestive) heart failure: Secondary | ICD-10-CM | POA: Diagnosis not present

## 2021-08-20 DIAGNOSIS — I872 Venous insufficiency (chronic) (peripheral): Secondary | ICD-10-CM | POA: Diagnosis not present

## 2021-08-20 DIAGNOSIS — I110 Hypertensive heart disease with heart failure: Secondary | ICD-10-CM | POA: Diagnosis not present

## 2021-08-20 DIAGNOSIS — N390 Urinary tract infection, site not specified: Secondary | ICD-10-CM | POA: Diagnosis not present

## 2021-08-25 DIAGNOSIS — E039 Hypothyroidism, unspecified: Secondary | ICD-10-CM | POA: Diagnosis not present

## 2021-08-25 DIAGNOSIS — Z9181 History of falling: Secondary | ICD-10-CM | POA: Diagnosis not present

## 2021-08-25 DIAGNOSIS — G4700 Insomnia, unspecified: Secondary | ICD-10-CM | POA: Diagnosis not present

## 2021-08-25 DIAGNOSIS — N8110 Cystocele, unspecified: Secondary | ICD-10-CM | POA: Diagnosis not present

## 2021-08-25 DIAGNOSIS — I5033 Acute on chronic diastolic (congestive) heart failure: Secondary | ICD-10-CM | POA: Diagnosis not present

## 2021-08-25 DIAGNOSIS — I872 Venous insufficiency (chronic) (peripheral): Secondary | ICD-10-CM | POA: Diagnosis not present

## 2021-08-25 DIAGNOSIS — I110 Hypertensive heart disease with heart failure: Secondary | ICD-10-CM | POA: Diagnosis not present

## 2021-08-25 DIAGNOSIS — I350 Nonrheumatic aortic (valve) stenosis: Secondary | ICD-10-CM | POA: Diagnosis not present

## 2021-08-25 DIAGNOSIS — K219 Gastro-esophageal reflux disease without esophagitis: Secondary | ICD-10-CM | POA: Diagnosis not present

## 2021-08-25 DIAGNOSIS — I959 Hypotension, unspecified: Secondary | ICD-10-CM | POA: Diagnosis not present

## 2021-08-25 DIAGNOSIS — M810 Age-related osteoporosis without current pathological fracture: Secondary | ICD-10-CM | POA: Diagnosis not present

## 2021-08-25 DIAGNOSIS — J449 Chronic obstructive pulmonary disease, unspecified: Secondary | ICD-10-CM | POA: Diagnosis not present

## 2021-08-25 DIAGNOSIS — Z993 Dependence on wheelchair: Secondary | ICD-10-CM | POA: Diagnosis not present

## 2021-08-25 DIAGNOSIS — N390 Urinary tract infection, site not specified: Secondary | ICD-10-CM | POA: Diagnosis not present

## 2021-08-28 DIAGNOSIS — I5033 Acute on chronic diastolic (congestive) heart failure: Secondary | ICD-10-CM | POA: Diagnosis not present

## 2021-08-28 DIAGNOSIS — J449 Chronic obstructive pulmonary disease, unspecified: Secondary | ICD-10-CM | POA: Diagnosis not present

## 2021-08-28 DIAGNOSIS — I959 Hypotension, unspecified: Secondary | ICD-10-CM | POA: Diagnosis not present

## 2021-08-28 DIAGNOSIS — I872 Venous insufficiency (chronic) (peripheral): Secondary | ICD-10-CM | POA: Diagnosis not present

## 2021-08-28 DIAGNOSIS — M810 Age-related osteoporosis without current pathological fracture: Secondary | ICD-10-CM | POA: Diagnosis not present

## 2021-08-28 DIAGNOSIS — E039 Hypothyroidism, unspecified: Secondary | ICD-10-CM | POA: Diagnosis not present

## 2021-08-28 DIAGNOSIS — I110 Hypertensive heart disease with heart failure: Secondary | ICD-10-CM | POA: Diagnosis not present

## 2021-08-28 DIAGNOSIS — N390 Urinary tract infection, site not specified: Secondary | ICD-10-CM | POA: Diagnosis not present

## 2021-08-28 DIAGNOSIS — Z9181 History of falling: Secondary | ICD-10-CM | POA: Diagnosis not present

## 2021-08-28 DIAGNOSIS — N8110 Cystocele, unspecified: Secondary | ICD-10-CM | POA: Diagnosis not present

## 2021-08-28 DIAGNOSIS — Z993 Dependence on wheelchair: Secondary | ICD-10-CM | POA: Diagnosis not present

## 2021-08-28 DIAGNOSIS — G4700 Insomnia, unspecified: Secondary | ICD-10-CM | POA: Diagnosis not present

## 2021-08-28 DIAGNOSIS — K219 Gastro-esophageal reflux disease without esophagitis: Secondary | ICD-10-CM | POA: Diagnosis not present

## 2021-08-28 DIAGNOSIS — I350 Nonrheumatic aortic (valve) stenosis: Secondary | ICD-10-CM | POA: Diagnosis not present

## 2021-08-31 DIAGNOSIS — I5033 Acute on chronic diastolic (congestive) heart failure: Secondary | ICD-10-CM | POA: Diagnosis not present

## 2021-09-08 DIAGNOSIS — I5033 Acute on chronic diastolic (congestive) heart failure: Secondary | ICD-10-CM | POA: Diagnosis not present

## 2021-09-13 ENCOUNTER — Other Ambulatory Visit: Payer: Self-pay | Admitting: Adult Health

## 2021-09-29 ENCOUNTER — Telehealth: Payer: Self-pay | Admitting: Internal Medicine

## 2021-09-29 NOTE — Telephone Encounter (Signed)
Patient's daughter, [name] called to ask, " Patient is bed ridden, but wants her to have flu vaccine, any suggestions?" States patient No longer receiving home health services.  Per Dr. Melford Aase, unaware of any home flu vaccinations, unless w/ home health service. Does patient need palliative of hospice evaluation?   Tried to Left voicemail - mailbox full

## 2021-09-30 DIAGNOSIS — I5033 Acute on chronic diastolic (congestive) heart failure: Secondary | ICD-10-CM | POA: Diagnosis not present

## 2021-10-09 DIAGNOSIS — I5033 Acute on chronic diastolic (congestive) heart failure: Secondary | ICD-10-CM | POA: Diagnosis not present

## 2021-10-10 ENCOUNTER — Other Ambulatory Visit: Payer: Self-pay | Admitting: Internal Medicine

## 2021-10-10 MED ORDER — FLUCONAZOLE 100 MG PO TABS: 100.0000 mg | ORAL_TABLET | Freq: Every day | ORAL | 2 refills | Status: AC

## 2021-10-13 ENCOUNTER — Telehealth: Payer: Self-pay | Admitting: Internal Medicine

## 2021-10-13 NOTE — Chronic Care Management (AMB) (Signed)
  Chronic Care Management   Outreach Note  10/13/2021 Name: Alyssa Schmitt MRN: [id number] DOB: 01-21-1928  Referred by: Unk Pinto, MD Reason for referral : No chief complaint on file.   An unsuccessful telephone outreach was attempted today. The patient was referred to the pharmacist for assistance with care management and care coordination.   Follow Up Plan:   Tatjana Dellinger Upstream Scheduler

## 2021-11-05 ENCOUNTER — Ambulatory Visit: Payer: Medicare Other | Admitting: Podiatry

## 2021-11-08 DIAGNOSIS — I5033 Acute on chronic diastolic (congestive) heart failure: Secondary | ICD-10-CM | POA: Diagnosis not present

## 2021-12-03 ENCOUNTER — Ambulatory Visit: Payer: Medicare Other | Admitting: Nurse Practitioner

## 2021-12-03 ENCOUNTER — Ambulatory Visit: Payer: Medicare Other | Admitting: Adult Health Nurse Practitioner

## 2021-12-07 DEATH — deceased

## 2022-03-03 ENCOUNTER — Encounter: Payer: Medicare Other | Admitting: Adult Health Nurse Practitioner

## 2022-09-24 IMAGING — DX DG CHEST 1V PORT
1 series · 1 of 1 positions shown · non-contrast
Comparison: 06/22/2018

CLINICAL DATA: Acute respiratory failure with hypoxia

EXAM:
PORTABLE CHEST 1 VIEW

[chest]
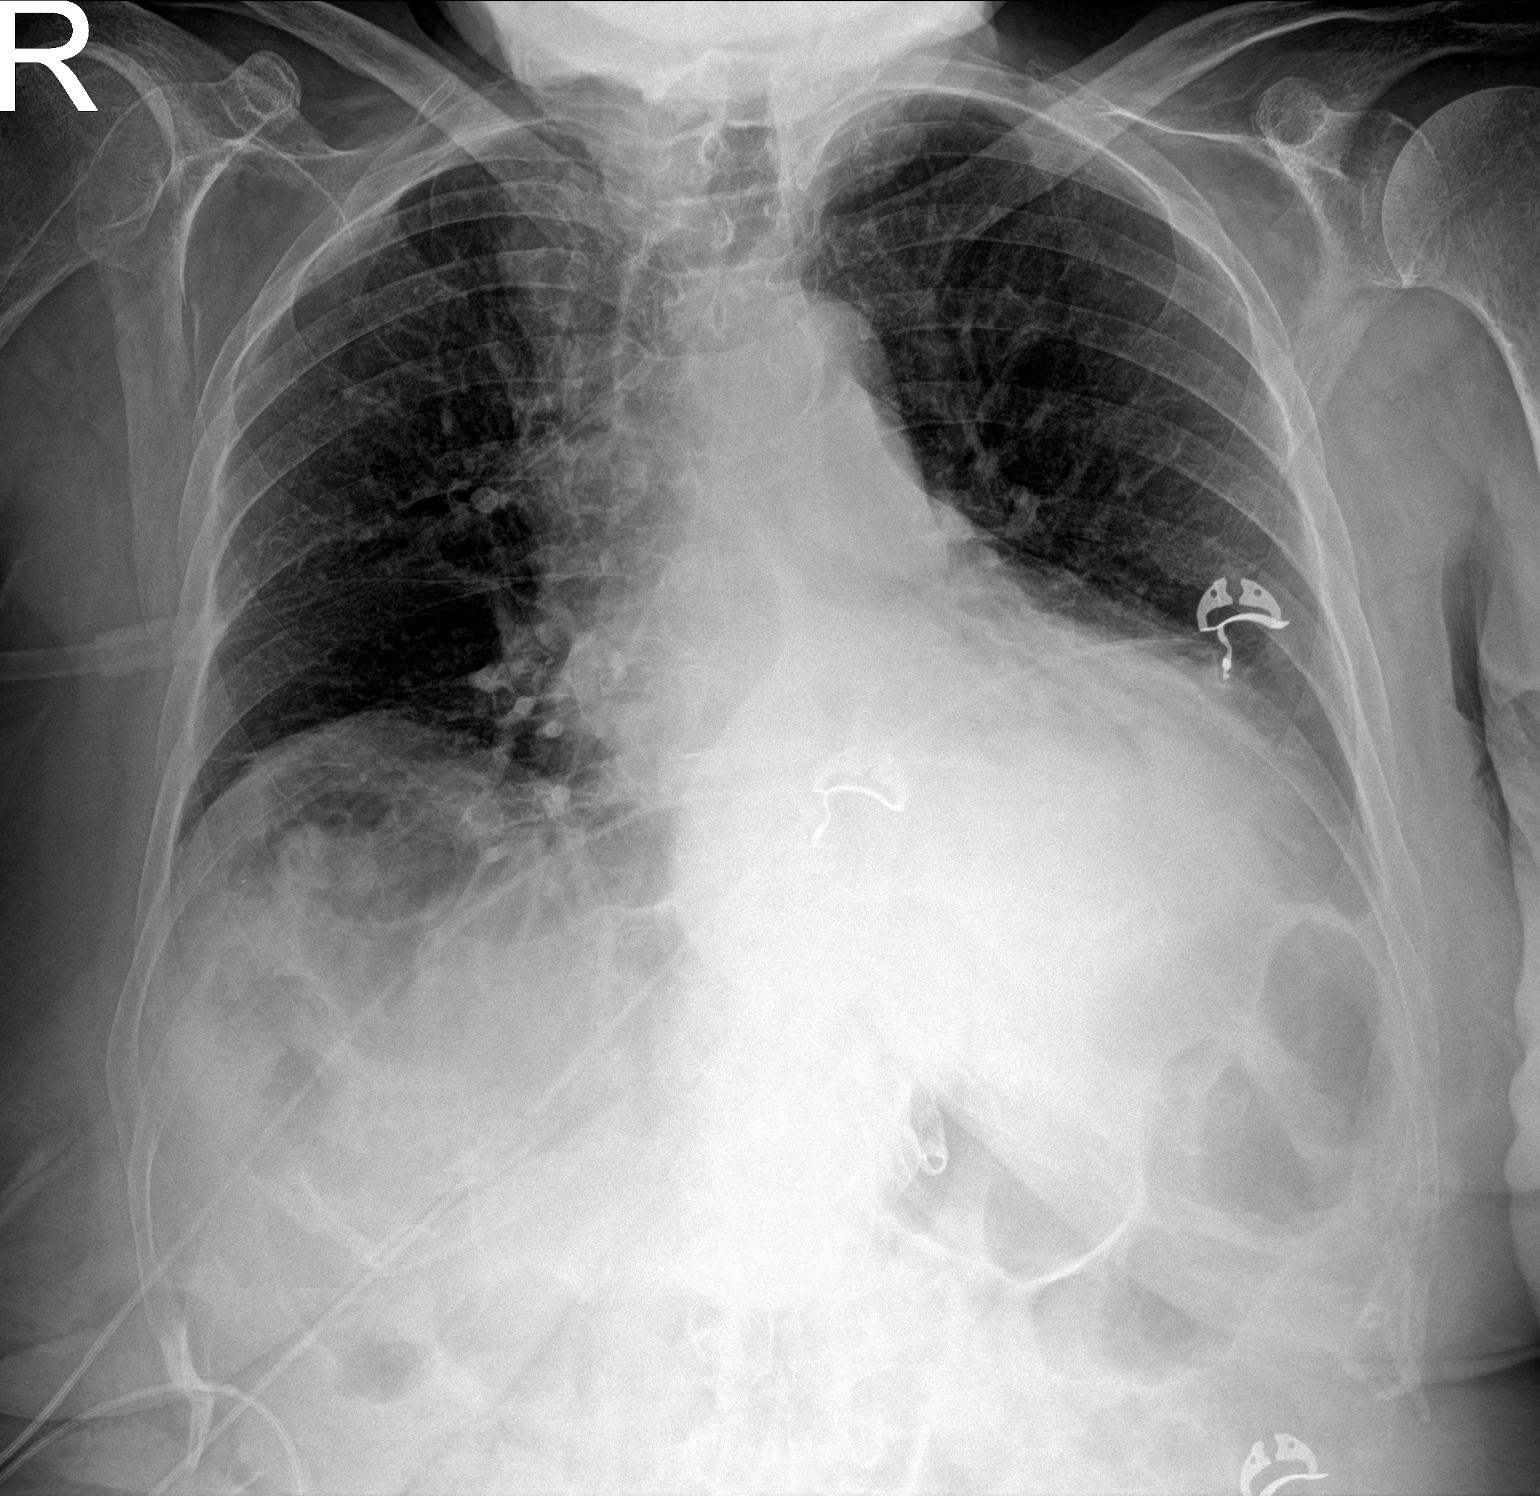

[1 of 1 positions shown; findings below may reference images not displayed]

FINDINGS: Cardiomegaly. Large hiatal hernia was also seen in 7316. Low volume
chest. Hazy density behind the heart and streaky density at the
right base. Trace pleural fluid is possible. No pneumothorax.
IMPRESSION: 1. Limited low volume chest.
2. Retrocardiac opacification that could be atelectasis or
infection.
3. Large hiatal hernia.

## 2022-09-26 IMAGING — DX DG CHEST 1V PORT
1 series · 1 of 1 positions shown · non-contrast
Comparison: One-view chest x-ray 04/11/2021.

CLINICAL DATA: Hypoxemia.

EXAM:
PORTABLE CHEST 1 VIEW

[chest ap]
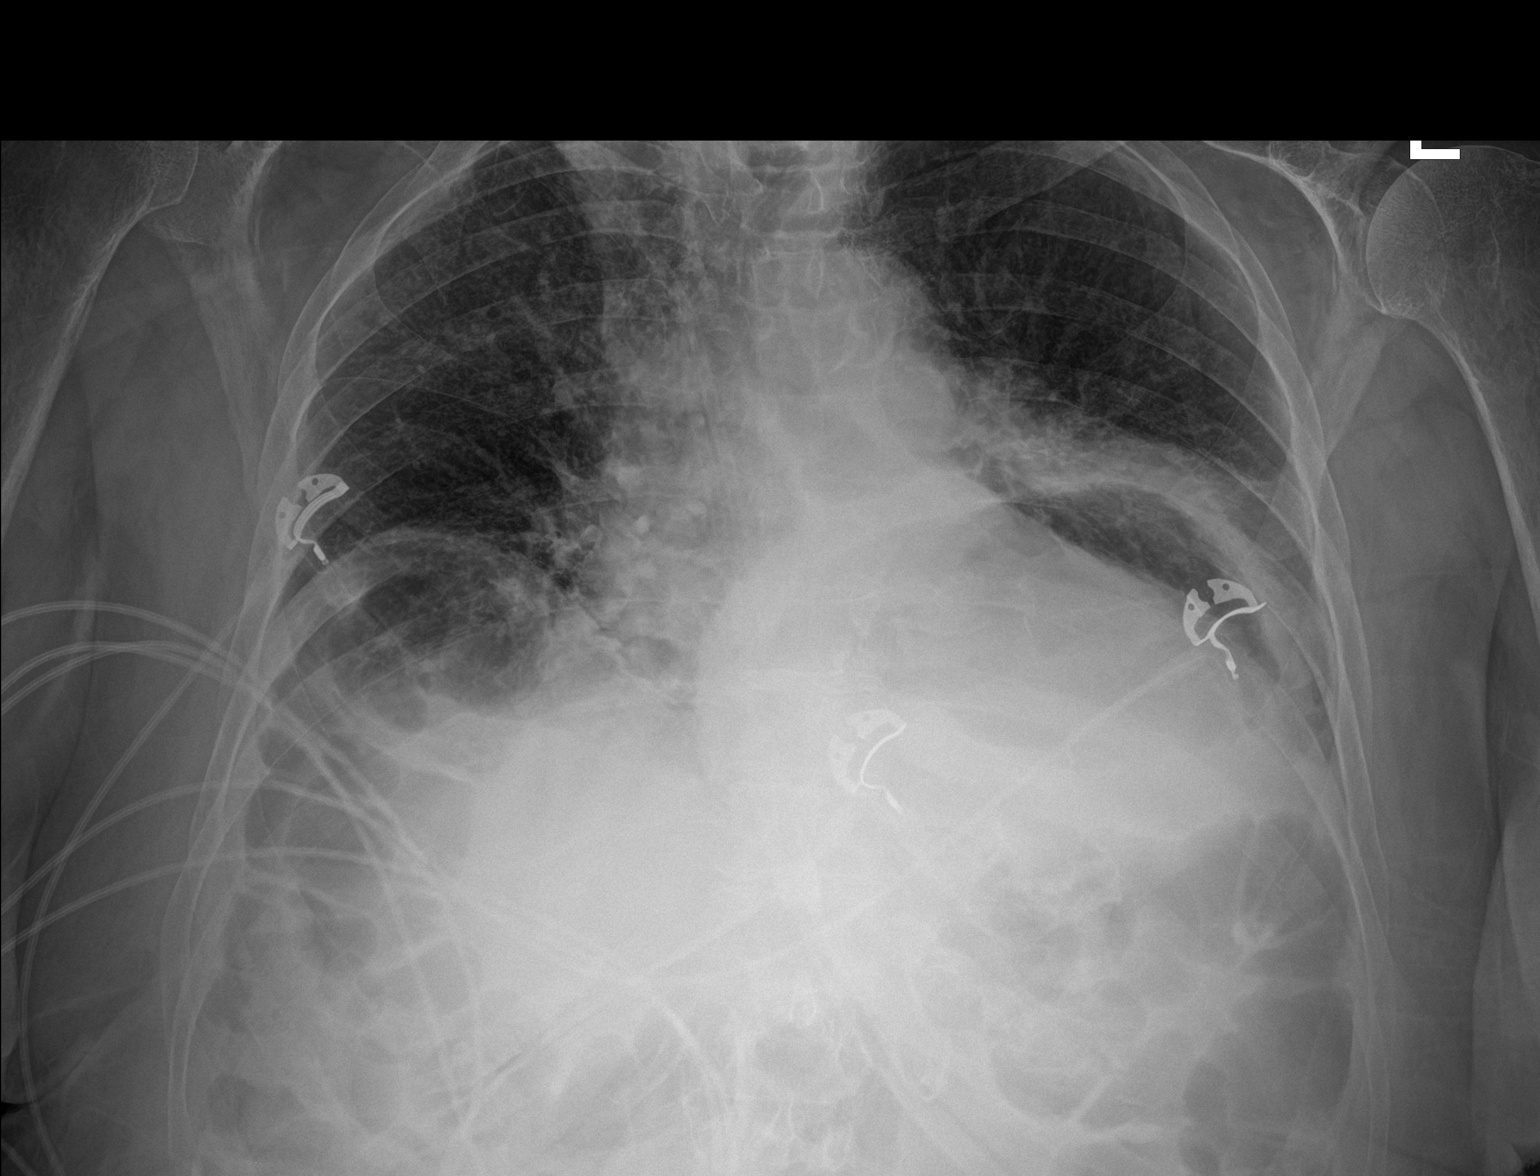

[1 of 1 positions shown; findings below may reference images not displayed]

FINDINGS: Heart is enlarged. Lung volumes are low. Elevated left hemidiaphragm
with gastric bubble noted. Increasing pulmonary vascular congestion
and edema present.
IMPRESSION: 1. Cardiomegaly with increasing pulmonary vascular congestion and
edema compatible with congestive heart failure.
2. Low lung volumes.
